# Patient Record
Sex: Female | Born: 1935 | Race: Black or African American | Hispanic: No | Smoking: Former smoker
Health system: Southern US, Community
[De-identification: ages and names within clinical notes are randomized; demographics above are authoritative.]

## PROBLEM LIST (undated history)

## (undated) DIAGNOSIS — K5909 Other constipation: Secondary | ICD-10-CM

## (undated) DIAGNOSIS — I712 Thoracic aortic aneurysm, without rupture, unspecified: Secondary | ICD-10-CM

## (undated) DIAGNOSIS — E876 Hypokalemia: Secondary | ICD-10-CM

## (undated) DIAGNOSIS — M129 Arthropathy, unspecified: Secondary | ICD-10-CM

## (undated) DIAGNOSIS — G622 Polyneuropathy due to other toxic agents: Secondary | ICD-10-CM

## (undated) DIAGNOSIS — I10 Essential (primary) hypertension: Secondary | ICD-10-CM

## (undated) DIAGNOSIS — I498 Other specified cardiac arrhythmias: Secondary | ICD-10-CM

## (undated) DIAGNOSIS — E785 Hyperlipidemia, unspecified: Secondary | ICD-10-CM

## (undated) DIAGNOSIS — T4145XA Adverse effect of unspecified anesthetic, initial encounter: Secondary | ICD-10-CM

## (undated) DIAGNOSIS — D649 Anemia, unspecified: Secondary | ICD-10-CM

## (undated) DIAGNOSIS — N390 Urinary tract infection, site not specified: Secondary | ICD-10-CM

## (undated) DIAGNOSIS — H409 Unspecified glaucoma: Secondary | ICD-10-CM

## (undated) DIAGNOSIS — M159 Polyosteoarthritis, unspecified: Secondary | ICD-10-CM

## (undated) DIAGNOSIS — R1310 Dysphagia, unspecified: Secondary | ICD-10-CM

## (undated) DIAGNOSIS — G894 Chronic pain syndrome: Secondary | ICD-10-CM

## (undated) DIAGNOSIS — J690 Pneumonitis due to inhalation of food and vomit: Secondary | ICD-10-CM

## (undated) DIAGNOSIS — M12812 Other specific arthropathies, not elsewhere classified, left shoulder: Secondary | ICD-10-CM

## (undated) DIAGNOSIS — R109 Unspecified abdominal pain: Secondary | ICD-10-CM

## (undated) DIAGNOSIS — C169 Malignant neoplasm of stomach, unspecified: Secondary | ICD-10-CM

## (undated) DIAGNOSIS — I82409 Acute embolism and thrombosis of unspecified deep veins of unspecified lower extremity: Secondary | ICD-10-CM

## (undated) DIAGNOSIS — M1710 Unilateral primary osteoarthritis, unspecified knee: Secondary | ICD-10-CM

## (undated) DIAGNOSIS — R011 Cardiac murmur, unspecified: Secondary | ICD-10-CM

## (undated) DIAGNOSIS — N959 Unspecified menopausal and perimenopausal disorder: Secondary | ICD-10-CM

## (undated) DIAGNOSIS — J984 Other disorders of lung: Secondary | ICD-10-CM

## (undated) DIAGNOSIS — Z85028 Personal history of other malignant neoplasm of stomach: Secondary | ICD-10-CM

## (undated) DIAGNOSIS — L97423 Non-pressure chronic ulcer of left heel and midfoot with necrosis of muscle: Secondary | ICD-10-CM

## (undated) DIAGNOSIS — K3184 Gastroparesis: Secondary | ICD-10-CM

## (undated) DIAGNOSIS — E119 Type 2 diabetes mellitus without complications: Secondary | ICD-10-CM

## (undated) DIAGNOSIS — R32 Unspecified urinary incontinence: Secondary | ICD-10-CM

## (undated) DIAGNOSIS — G309 Alzheimer's disease, unspecified: Secondary | ICD-10-CM

## (undated) DIAGNOSIS — G629 Polyneuropathy, unspecified: Secondary | ICD-10-CM

## (undated) DIAGNOSIS — F0280 Dementia in other diseases classified elsewhere without behavioral disturbance: Secondary | ICD-10-CM

## (undated) DIAGNOSIS — E1151 Type 2 diabetes mellitus with diabetic peripheral angiopathy without gangrene: Secondary | ICD-10-CM

## (undated) DIAGNOSIS — M109 Gout, unspecified: Secondary | ICD-10-CM

## (undated) DIAGNOSIS — S82201A Unspecified fracture of shaft of right tibia, initial encounter for closed fracture: Secondary | ICD-10-CM

## (undated) DIAGNOSIS — F329 Major depressive disorder, single episode, unspecified: Secondary | ICD-10-CM

## (undated) DIAGNOSIS — S7291XA Unspecified fracture of right femur, initial encounter for closed fracture: Secondary | ICD-10-CM

## (undated) DIAGNOSIS — K759 Inflammatory liver disease, unspecified: Secondary | ICD-10-CM

## (undated) DIAGNOSIS — R918 Other nonspecific abnormal finding of lung field: Secondary | ICD-10-CM

## (undated) DIAGNOSIS — R55 Syncope and collapse: Secondary | ICD-10-CM

## (undated) DIAGNOSIS — I7025 Atherosclerosis of native arteries of other extremities with ulceration: Secondary | ICD-10-CM

## (undated) DIAGNOSIS — M75101 Unspecified rotator cuff tear or rupture of right shoulder, not specified as traumatic: Secondary | ICD-10-CM

## (undated) DIAGNOSIS — T8859XA Other complications of anesthesia, initial encounter: Secondary | ICD-10-CM

## (undated) DIAGNOSIS — G609 Hereditary and idiopathic neuropathy, unspecified: Secondary | ICD-10-CM

## (undated) DIAGNOSIS — M75102 Unspecified rotator cuff tear or rupture of left shoulder, not specified as traumatic: Secondary | ICD-10-CM

## (undated) DIAGNOSIS — M1990 Unspecified osteoarthritis, unspecified site: Secondary | ICD-10-CM

## (undated) DIAGNOSIS — K219 Gastro-esophageal reflux disease without esophagitis: Secondary | ICD-10-CM

## (undated) DIAGNOSIS — B9620 Unspecified Escherichia coli [E. coli] as the cause of diseases classified elsewhere: Secondary | ICD-10-CM

## (undated) DIAGNOSIS — M12811 Other specific arthropathies, not elsewhere classified, right shoulder: Secondary | ICD-10-CM

## (undated) DIAGNOSIS — S82209A Unspecified fracture of shaft of unspecified tibia, initial encounter for closed fracture: Secondary | ICD-10-CM

## (undated) DIAGNOSIS — E1143 Type 2 diabetes mellitus with diabetic autonomic (poly)neuropathy: Secondary | ICD-10-CM

## (undated) HISTORY — DX: Personal history of other malignant neoplasm of stomach: Z85.028

## (undated) HISTORY — DX: Other nonspecific abnormal finding of lung field: R91.8

## (undated) HISTORY — DX: Unilateral primary osteoarthritis, unspecified knee: M17.10

## (undated) HISTORY — DX: Unspecified menopausal and perimenopausal disorder: N95.9

## (undated) HISTORY — PX: CATARACT EXTRACTION W/ INTRAOCULAR LENS  IMPLANT, BILATERAL: SHX1307

## (undated) HISTORY — DX: Hypokalemia: E87.6

## (undated) HISTORY — DX: Unspecified Escherichia coli (E. coli) as the cause of diseases classified elsewhere: B96.20

## (undated) HISTORY — DX: Dysphagia, unspecified: R13.10

## (undated) HISTORY — DX: Chronic pain syndrome: G89.4

## (undated) HISTORY — DX: Urinary tract infection, site not specified: N39.0

## (undated) HISTORY — DX: Unspecified rotator cuff tear or rupture of right shoulder, not specified as traumatic: M75.101

## (undated) HISTORY — DX: Anemia, unspecified: D64.9

## (undated) HISTORY — DX: Arthropathy, unspecified: M12.9

## (undated) HISTORY — DX: Malignant neoplasm of stomach, unspecified: C16.9

## (undated) HISTORY — DX: Unspecified abdominal pain: R10.9

## (undated) HISTORY — DX: Unspecified fracture of right femur, initial encounter for closed fracture: S72.91XA

## (undated) HISTORY — DX: Atherosclerosis of native arteries of other extremities with ulceration: I70.25

## (undated) HISTORY — PX: COLONOSCOPY: SHX174

## (undated) HISTORY — DX: Hereditary and idiopathic neuropathy, unspecified: G60.9

## (undated) HISTORY — DX: Gastro-esophageal reflux disease without esophagitis: K21.9

## (undated) HISTORY — DX: Polyneuropathy due to other toxic agents: G62.2

## (undated) HISTORY — DX: Type 2 diabetes mellitus without complications: E11.9

## (undated) HISTORY — DX: Alzheimer's disease, unspecified: G30.9

## (undated) HISTORY — DX: Type 2 diabetes mellitus with diabetic peripheral angiopathy without gangrene: E11.51

## (undated) HISTORY — DX: Other specified cardiac arrhythmias: I49.8

## (undated) HISTORY — DX: Dementia in other diseases classified elsewhere without behavioral disturbance: F02.80

## (undated) HISTORY — DX: Polyneuropathy, unspecified: G62.9

## (undated) HISTORY — DX: Other disorders of lung: J98.4

## (undated) HISTORY — DX: Unspecified urinary incontinence: R32

## (undated) HISTORY — DX: Gout, unspecified: M10.9

## (undated) HISTORY — DX: Other specific arthropathies, not elsewhere classified, right shoulder: M12.811

## (undated) HISTORY — PX: FRACTURE SURGERY: SHX138

## (undated) HISTORY — DX: Other constipation: K59.09

## (undated) HISTORY — DX: Unspecified fracture of shaft of unspecified tibia, initial encounter for closed fracture: S82.209A

## (undated) HISTORY — DX: Gastroparesis: K31.84

## (undated) HISTORY — DX: Polyosteoarthritis, unspecified: M15.9

## (undated) HISTORY — DX: Other specific arthropathies, not elsewhere classified, left shoulder: M12.812

## (undated) HISTORY — DX: Cardiac murmur, unspecified: R01.1

## (undated) HISTORY — DX: Unspecified glaucoma: H40.9

## (undated) HISTORY — DX: Major depressive disorder, single episode, unspecified: F32.9

## (undated) HISTORY — PX: BILROTH II PROCEDURE: SHX1232

## (undated) HISTORY — DX: Syncope and collapse: R55

## (undated) HISTORY — DX: Hyperlipidemia, unspecified: E78.5

## (undated) HISTORY — DX: Non-pressure chronic ulcer of left heel and midfoot with necrosis of muscle: L97.423

## (undated) HISTORY — DX: Essential (primary) hypertension: I10

## (undated) HISTORY — DX: Unspecified rotator cuff tear or rupture of left shoulder, not specified as traumatic: M75.102

## (undated) HISTORY — DX: Unspecified fracture of shaft of right tibia, initial encounter for closed fracture: S82.201A

## (undated) HISTORY — DX: Type 2 diabetes mellitus with diabetic autonomic (poly)neuropathy: E11.43

---

## 1998-05-05 ENCOUNTER — Other Ambulatory Visit: Admission: RE | Admit: 1998-05-05 | Discharge: 1998-05-05 | Payer: Self-pay | Admitting: Internal Medicine

## 1999-01-22 ENCOUNTER — Emergency Department (HOSPITAL_COMMUNITY): Admission: EM | Admit: 1999-01-22 | Discharge: 1999-01-22 | Payer: Self-pay | Admitting: Emergency Medicine

## 1999-01-24 ENCOUNTER — Encounter: Payer: Self-pay | Admitting: Emergency Medicine

## 1999-01-24 ENCOUNTER — Emergency Department (HOSPITAL_COMMUNITY): Admission: EM | Admit: 1999-01-24 | Discharge: 1999-01-24 | Payer: Self-pay | Admitting: Emergency Medicine

## 1999-01-25 ENCOUNTER — Encounter: Payer: Self-pay | Admitting: Internal Medicine

## 1999-01-25 ENCOUNTER — Ambulatory Visit (HOSPITAL_COMMUNITY): Admission: RE | Admit: 1999-01-25 | Discharge: 1999-01-25 | Payer: Self-pay | Admitting: Internal Medicine

## 1999-02-08 ENCOUNTER — Other Ambulatory Visit: Admission: RE | Admit: 1999-02-08 | Discharge: 1999-02-08 | Payer: Self-pay | Admitting: Obstetrics and Gynecology

## 1999-08-19 ENCOUNTER — Encounter: Payer: Self-pay | Admitting: Emergency Medicine

## 1999-08-19 ENCOUNTER — Emergency Department (HOSPITAL_COMMUNITY): Admission: EM | Admit: 1999-08-19 | Discharge: 1999-08-20 | Payer: Self-pay | Admitting: Emergency Medicine

## 2000-02-17 ENCOUNTER — Encounter: Payer: Self-pay | Admitting: Internal Medicine

## 2000-02-17 ENCOUNTER — Ambulatory Visit (HOSPITAL_COMMUNITY): Admission: RE | Admit: 2000-02-17 | Discharge: 2000-02-17 | Payer: Self-pay | Admitting: Internal Medicine

## 2000-10-17 ENCOUNTER — Other Ambulatory Visit: Admission: RE | Admit: 2000-10-17 | Discharge: 2000-10-17 | Payer: Self-pay | Admitting: Internal Medicine

## 2000-12-18 ENCOUNTER — Ambulatory Visit (HOSPITAL_COMMUNITY): Admission: RE | Admit: 2000-12-18 | Discharge: 2000-12-18 | Payer: Self-pay | Admitting: Internal Medicine

## 2000-12-18 ENCOUNTER — Encounter: Payer: Self-pay | Admitting: Internal Medicine

## 2002-08-08 ENCOUNTER — Emergency Department (HOSPITAL_COMMUNITY): Admission: EM | Admit: 2002-08-08 | Discharge: 2002-08-09 | Payer: Self-pay | Admitting: *Deleted

## 2002-08-08 ENCOUNTER — Encounter: Payer: Self-pay | Admitting: *Deleted

## 2007-02-06 ENCOUNTER — Ambulatory Visit: Payer: Self-pay | Admitting: Oncology

## 2007-03-02 LAB — COMPREHENSIVE METABOLIC PANEL
ALT: 9 U/L (ref 0–35)
AST: 22 U/L (ref 0–37)
Albumin: 3.9 g/dL (ref 3.5–5.2)
CO2: 26 mEq/L (ref 19–32)
Calcium: 9.4 mg/dL (ref 8.4–10.5)
Chloride: 103 mEq/L (ref 96–112)
Potassium: 3.7 mEq/L (ref 3.5–5.3)

## 2007-03-02 LAB — CBC WITH DIFFERENTIAL/PLATELET
BASO%: 0.9 % (ref 0.0–2.0)
Basophils Absolute: 0.1 10*3/uL (ref 0.0–0.1)
EOS%: 5 % (ref 0.0–7.0)
HCT: 33.6 % — ABNORMAL LOW (ref 34.8–46.6)
HGB: 11.4 g/dL — ABNORMAL LOW (ref 11.6–15.9)
MCH: 33.3 pg (ref 26.0–34.0)
MCHC: 33.8 g/dL (ref 32.0–36.0)
MONO#: 1 10*3/uL — ABNORMAL HIGH (ref 0.1–0.9)
RDW: 14.5 % (ref 11.3–14.5)
WBC: 7.6 10*3/uL (ref 3.9–10.0)
lymph#: 1.7 10*3/uL (ref 0.9–3.3)

## 2007-03-02 LAB — CEA: CEA: 1.5 ng/mL (ref 0.0–5.0)

## 2007-04-05 ENCOUNTER — Ambulatory Visit: Payer: Self-pay | Admitting: Internal Medicine

## 2007-04-05 LAB — CONVERTED CEMR LAB
AST: 26 units/L (ref 0–37)
Basophils Absolute: 0 10*3/uL (ref 0.0–0.1)
CO2: 29 meq/L (ref 19–32)
Calcium: 9.7 mg/dL (ref 8.4–10.5)
Eosinophils Absolute: 0.3 10*3/uL (ref 0.0–0.6)
Eosinophils Relative: 4.3 % (ref 0.0–5.0)
Folate: 13.1 ng/mL
GFR calc Af Amer: 62 mL/min
HDL: 49.6 mg/dL (ref >39.0)
Microalb Creat Ratio: 7.2 mg/g (ref 0.0–30.0)
Monocytes Absolute: 1.1 10*3/uL — ABNORMAL HIGH (ref 0.2–0.7)
Neutro Abs: 4.4 10*3/uL (ref 1.4–7.7)
Platelets: 217 10*3/uL (ref 150–400)
Potassium: 4.8 meq/L (ref 3.5–5.1)
RBC: 3.65 M/uL — ABNORMAL LOW (ref 3.87–5.11)
RDW: 14.3 % (ref 11.5–14.6)
Saturation Ratios: 19.9 % — ABNORMAL LOW (ref 20.0–50.0)
Sodium: 140 meq/L (ref 135–145)
TSH: 0.96 microintl units/mL (ref 0.35–5.50)
Transferrin: 240.7 mg/dL (ref >=212.0)
VLDL: 20 mg/dL (ref 0–40)
WBC: 7.8 10*3/uL (ref 4.5–10.5)

## 2007-04-27 ENCOUNTER — Ambulatory Visit: Payer: Self-pay | Admitting: Gastroenterology

## 2007-04-27 LAB — CONVERTED CEMR LAB
Ferritin: 37.6 ng/mL (ref 10.0–291.0)
Folate: 14 ng/mL
Transferrin: 215.3 mg/dL (ref >=212.0)

## 2007-04-30 ENCOUNTER — Ambulatory Visit: Payer: Self-pay | Admitting: Gastroenterology

## 2007-05-15 ENCOUNTER — Ambulatory Visit: Payer: Self-pay | Admitting: Internal Medicine

## 2007-05-21 ENCOUNTER — Encounter: Admission: RE | Admit: 2007-05-21 | Discharge: 2007-05-21 | Payer: Self-pay | Admitting: Internal Medicine

## 2007-05-29 ENCOUNTER — Ambulatory Visit: Payer: Self-pay | Admitting: Oncology

## 2007-06-05 ENCOUNTER — Emergency Department (HOSPITAL_COMMUNITY): Admission: EM | Admit: 2007-06-05 | Discharge: 2007-06-05 | Payer: Self-pay | Admitting: Emergency Medicine

## 2007-06-07 ENCOUNTER — Ambulatory Visit: Payer: Self-pay | Admitting: Internal Medicine

## 2007-07-03 ENCOUNTER — Ambulatory Visit: Payer: Self-pay | Admitting: Internal Medicine

## 2007-09-17 ENCOUNTER — Telehealth (INDEPENDENT_AMBULATORY_CARE_PROVIDER_SITE_OTHER): Payer: Self-pay | Admitting: *Deleted

## 2007-09-19 ENCOUNTER — Ambulatory Visit: Payer: Self-pay | Admitting: Internal Medicine

## 2007-09-19 DIAGNOSIS — M109 Gout, unspecified: Secondary | ICD-10-CM

## 2007-09-19 DIAGNOSIS — E119 Type 2 diabetes mellitus without complications: Secondary | ICD-10-CM

## 2007-09-19 DIAGNOSIS — Z85028 Personal history of other malignant neoplasm of stomach: Secondary | ICD-10-CM

## 2007-09-19 DIAGNOSIS — I10 Essential (primary) hypertension: Secondary | ICD-10-CM

## 2007-09-19 DIAGNOSIS — H409 Unspecified glaucoma: Secondary | ICD-10-CM

## 2007-09-19 DIAGNOSIS — G622 Polyneuropathy due to other toxic agents: Secondary | ICD-10-CM

## 2007-09-19 HISTORY — DX: Polyneuropathy due to other toxic agents: G62.2

## 2007-09-19 HISTORY — DX: Essential (primary) hypertension: I10

## 2007-09-19 HISTORY — DX: Unspecified glaucoma: H40.9

## 2007-09-19 HISTORY — DX: Personal history of other malignant neoplasm of stomach: Z85.028

## 2007-09-19 HISTORY — DX: Gout, unspecified: M10.9

## 2007-09-19 HISTORY — DX: Type 2 diabetes mellitus without complications: E11.9

## 2007-09-21 ENCOUNTER — Telehealth: Payer: Self-pay | Admitting: Internal Medicine

## 2007-10-03 ENCOUNTER — Ambulatory Visit: Payer: Self-pay | Admitting: Internal Medicine

## 2007-10-03 DIAGNOSIS — M159 Polyosteoarthritis, unspecified: Secondary | ICD-10-CM | POA: Insufficient documentation

## 2007-10-03 HISTORY — DX: Polyosteoarthritis, unspecified: M15.9

## 2007-12-07 ENCOUNTER — Ambulatory Visit: Payer: Self-pay | Admitting: Internal Medicine

## 2007-12-07 DIAGNOSIS — M1710 Unilateral primary osteoarthritis, unspecified knee: Secondary | ICD-10-CM

## 2007-12-07 DIAGNOSIS — IMO0002 Reserved for concepts with insufficient information to code with codable children: Secondary | ICD-10-CM

## 2007-12-07 HISTORY — DX: Reserved for concepts with insufficient information to code with codable children: IMO0002

## 2007-12-10 ENCOUNTER — Encounter (INDEPENDENT_AMBULATORY_CARE_PROVIDER_SITE_OTHER): Payer: Self-pay | Admitting: *Deleted

## 2007-12-25 ENCOUNTER — Encounter: Payer: Self-pay | Admitting: Internal Medicine

## 2008-02-12 ENCOUNTER — Encounter: Payer: Self-pay | Admitting: Internal Medicine

## 2008-02-19 ENCOUNTER — Ambulatory Visit: Payer: Self-pay | Admitting: Oncology

## 2008-02-21 ENCOUNTER — Encounter: Payer: Self-pay | Admitting: Internal Medicine

## 2008-02-21 ENCOUNTER — Ambulatory Visit (HOSPITAL_COMMUNITY): Admission: RE | Admit: 2008-02-21 | Discharge: 2008-02-21 | Payer: Self-pay | Admitting: Oncology

## 2008-02-21 LAB — COMPREHENSIVE METABOLIC PANEL
CO2: 25 mEq/L (ref 19–32)
Calcium: 9.8 mg/dL (ref 8.4–10.5)
Creatinine, Ser: 1.28 mg/dL — ABNORMAL HIGH (ref 0.40–1.20)
Glucose, Bld: 103 mg/dL — ABNORMAL HIGH (ref 70–99)
Sodium: 140 mEq/L (ref 135–145)
Total Bilirubin: 0.5 mg/dL (ref 0.3–1.2)
Total Protein: 7.3 g/dL (ref 6.0–8.3)

## 2008-02-21 LAB — CBC WITH DIFFERENTIAL/PLATELET
Eosinophils Absolute: 0.1 10*3/uL (ref 0.0–0.5)
HCT: 35.4 % (ref 34.8–46.6)
LYMPH%: 22.2 % (ref 14.0–48.0)
MONO#: 1 10*3/uL — ABNORMAL HIGH (ref 0.1–0.9)
NEUT#: 3.6 10*3/uL (ref 1.5–6.5)
NEUT%: 58.6 % (ref 39.6–76.8)
Platelets: 180 10*3/uL (ref 145–400)
WBC: 6.1 10*3/uL (ref 3.9–10.0)

## 2008-02-21 LAB — CEA: CEA: 2.7 ng/mL (ref 0.0–5.0)

## 2008-02-21 LAB — LACTATE DEHYDROGENASE: LDH: 220 U/L (ref 94–250)

## 2008-03-03 DIAGNOSIS — C169 Malignant neoplasm of stomach, unspecified: Secondary | ICD-10-CM

## 2008-03-03 DIAGNOSIS — R918 Other nonspecific abnormal finding of lung field: Secondary | ICD-10-CM | POA: Insufficient documentation

## 2008-03-03 DIAGNOSIS — M129 Arthropathy, unspecified: Secondary | ICD-10-CM | POA: Insufficient documentation

## 2008-03-03 DIAGNOSIS — J984 Other disorders of lung: Secondary | ICD-10-CM

## 2008-03-03 HISTORY — DX: Malignant neoplasm of stomach, unspecified: C16.9

## 2008-03-03 HISTORY — DX: Other disorders of lung: J98.4

## 2008-03-03 HISTORY — DX: Other nonspecific abnormal finding of lung field: R91.8

## 2008-03-03 HISTORY — DX: Arthropathy, unspecified: M12.9

## 2008-03-06 ENCOUNTER — Ambulatory Visit: Payer: Self-pay | Admitting: Gastroenterology

## 2008-03-07 ENCOUNTER — Ambulatory Visit (HOSPITAL_COMMUNITY): Admission: RE | Admit: 2008-03-07 | Discharge: 2008-03-07 | Payer: Self-pay | Admitting: Internal Medicine

## 2008-03-17 ENCOUNTER — Encounter: Admission: RE | Admit: 2008-03-17 | Discharge: 2008-03-17 | Payer: Self-pay | Admitting: Oncology

## 2008-03-17 LAB — HM MAMMOGRAPHY

## 2008-03-31 ENCOUNTER — Ambulatory Visit: Payer: Self-pay | Admitting: Oncology

## 2008-04-02 ENCOUNTER — Encounter: Payer: Self-pay | Admitting: Internal Medicine

## 2008-04-07 ENCOUNTER — Ambulatory Visit: Payer: Self-pay | Admitting: Internal Medicine

## 2008-04-07 DIAGNOSIS — R109 Unspecified abdominal pain: Secondary | ICD-10-CM

## 2008-04-07 HISTORY — DX: Unspecified abdominal pain: R10.9

## 2008-04-07 LAB — CONVERTED CEMR LAB
BUN: 14 mg/dL (ref 6–23)
CO2: 30 meq/L (ref 19–32)
Calcium: 9.3 mg/dL (ref 8.4–10.5)
Chloride: 104 meq/L (ref 96–112)
Creatinine, Ser: 1.1 mg/dL (ref 0.4–1.2)
GFR calc Af Amer: 62 mL/min
GFR calc non Af Amer: 51 mL/min
Glucose, Bld: 91 mg/dL (ref 70–99)
Microalb Creat Ratio: 15.1 mg/g (ref 0.0–30.0)
Potassium: 4.5 meq/L (ref 3.5–5.1)
Sodium: 141 meq/L (ref 135–145)

## 2008-04-08 ENCOUNTER — Encounter: Payer: Self-pay | Admitting: Internal Medicine

## 2008-05-30 ENCOUNTER — Telehealth (INDEPENDENT_AMBULATORY_CARE_PROVIDER_SITE_OTHER): Payer: Self-pay | Admitting: *Deleted

## 2008-05-30 ENCOUNTER — Encounter: Payer: Self-pay | Admitting: Internal Medicine

## 2008-06-06 ENCOUNTER — Ambulatory Visit: Payer: Self-pay | Admitting: Oncology

## 2008-06-17 ENCOUNTER — Encounter: Payer: Self-pay | Admitting: Internal Medicine

## 2008-06-17 LAB — CBC WITH DIFFERENTIAL/PLATELET
BASO%: 1.4 % (ref 0.0–2.0)
LYMPH%: 26.1 % (ref 14.0–48.0)
MCHC: 33.4 g/dL (ref 32.0–36.0)
MCV: 102.7 fL — ABNORMAL HIGH (ref 81.0–101.0)
MONO%: 15.7 % — ABNORMAL HIGH (ref 0.0–13.0)
NEUT#: 3.4 10*3/uL (ref 1.5–6.5)
Platelets: 126 10*3/uL — ABNORMAL LOW (ref 145–400)
RBC: 3.53 10*6/uL — ABNORMAL LOW (ref 3.70–5.32)
RDW: 14.3 % (ref 11.3–14.5)
WBC: 6.2 10*3/uL (ref 3.9–10.0)

## 2008-06-17 LAB — COMPREHENSIVE METABOLIC PANEL
ALT: 12 U/L (ref 0–35)
AST: 20 U/L (ref 0–37)
Albumin: 3.7 g/dL (ref 3.5–5.2)
Alkaline Phosphatase: 67 U/L (ref 39–117)
Potassium: 4.2 mEq/L (ref 3.5–5.3)
Sodium: 140 mEq/L (ref 135–145)
Total Bilirubin: 0.3 mg/dL (ref 0.3–1.2)
Total Protein: 7 g/dL (ref 6.0–8.3)

## 2008-06-19 ENCOUNTER — Ambulatory Visit: Payer: Self-pay | Admitting: Internal Medicine

## 2008-06-19 DIAGNOSIS — G609 Hereditary and idiopathic neuropathy, unspecified: Secondary | ICD-10-CM

## 2008-06-19 DIAGNOSIS — E1142 Type 2 diabetes mellitus with diabetic polyneuropathy: Secondary | ICD-10-CM

## 2008-06-19 DIAGNOSIS — G629 Polyneuropathy, unspecified: Secondary | ICD-10-CM

## 2008-06-19 HISTORY — DX: Hereditary and idiopathic neuropathy, unspecified: G60.9

## 2008-06-19 HISTORY — DX: Polyneuropathy, unspecified: G62.9

## 2008-06-23 ENCOUNTER — Encounter (INDEPENDENT_AMBULATORY_CARE_PROVIDER_SITE_OTHER): Payer: Self-pay | Admitting: *Deleted

## 2008-06-30 ENCOUNTER — Telehealth (INDEPENDENT_AMBULATORY_CARE_PROVIDER_SITE_OTHER): Payer: Self-pay | Admitting: *Deleted

## 2008-07-04 ENCOUNTER — Telehealth (INDEPENDENT_AMBULATORY_CARE_PROVIDER_SITE_OTHER): Payer: Self-pay | Admitting: *Deleted

## 2008-07-08 ENCOUNTER — Ambulatory Visit: Payer: Self-pay | Admitting: Cardiovascular Disease

## 2008-07-15 ENCOUNTER — Telehealth (INDEPENDENT_AMBULATORY_CARE_PROVIDER_SITE_OTHER): Payer: Self-pay | Admitting: *Deleted

## 2008-07-23 ENCOUNTER — Encounter: Payer: Self-pay | Admitting: Internal Medicine

## 2008-08-01 ENCOUNTER — Telehealth (INDEPENDENT_AMBULATORY_CARE_PROVIDER_SITE_OTHER): Payer: Self-pay | Admitting: *Deleted

## 2008-08-07 ENCOUNTER — Ambulatory Visit: Payer: Self-pay | Admitting: Internal Medicine

## 2008-08-08 ENCOUNTER — Encounter: Payer: Self-pay | Admitting: Internal Medicine

## 2008-08-28 ENCOUNTER — Encounter: Payer: Self-pay | Admitting: Internal Medicine

## 2008-09-23 ENCOUNTER — Telehealth (INDEPENDENT_AMBULATORY_CARE_PROVIDER_SITE_OTHER): Payer: Self-pay | Admitting: *Deleted

## 2008-09-30 ENCOUNTER — Ambulatory Visit: Payer: Self-pay | Admitting: Internal Medicine

## 2008-09-30 DIAGNOSIS — R509 Fever, unspecified: Secondary | ICD-10-CM

## 2008-10-01 ENCOUNTER — Encounter: Payer: Self-pay | Admitting: Internal Medicine

## 2008-10-01 LAB — CONVERTED CEMR LAB
Crystals: NEGATIVE
Nitrite: POSITIVE — AB
RBC / HPF: NONE SEEN
Total Protein, Urine: NEGATIVE mg/dL

## 2008-10-07 ENCOUNTER — Encounter: Payer: Self-pay | Admitting: Internal Medicine

## 2008-10-14 ENCOUNTER — Ambulatory Visit: Payer: Self-pay | Admitting: Oncology

## 2008-10-21 ENCOUNTER — Encounter: Payer: Self-pay | Admitting: Internal Medicine

## 2008-12-17 ENCOUNTER — Ambulatory Visit: Payer: Self-pay | Admitting: Internal Medicine

## 2008-12-17 DIAGNOSIS — I498 Other specified cardiac arrhythmias: Secondary | ICD-10-CM

## 2008-12-17 DIAGNOSIS — N959 Unspecified menopausal and perimenopausal disorder: Secondary | ICD-10-CM | POA: Insufficient documentation

## 2008-12-17 HISTORY — DX: Unspecified menopausal and perimenopausal disorder: N95.9

## 2008-12-17 HISTORY — DX: Other specified cardiac arrhythmias: I49.8

## 2008-12-18 LAB — CONVERTED CEMR LAB
ALT: 17 units/L (ref 0–35)
BUN: 17 mg/dL (ref 6–23)
Bilirubin, Direct: 0.1 mg/dL (ref 0.0–0.3)
CO2: 29 meq/L (ref 19–32)
Chloride: 107 meq/L (ref 96–112)
Cholesterol: 192 mg/dL (ref 0–200)
Creatinine, Ser: 1 mg/dL (ref 0.4–1.2)
Creatinine,U: 146 mg/dL
Eosinophils Relative: 1.7 % (ref 0.0–5.0)
GFR calc Af Amer: 69 mL/min
HCT: 34.8 % — ABNORMAL LOW (ref 36.0–46.0)
HDL: 59.4 mg/dL (ref >39.0)
Hgb A1c MFr Bld: 5.7 % (ref 4.6–6.0)
Microalb Creat Ratio: 21.9 mg/g (ref 0.0–30.0)
Microalb, Ur: 3.2 mg/dL — ABNORMAL HIGH (ref 0.0–1.9)
Nitrite: NEGATIVE
Platelets: 142 10*3/uL — ABNORMAL LOW (ref 150–400)
Potassium: 4.3 meq/L (ref 3.5–5.1)
RBC: 3.42 M/uL — ABNORMAL LOW (ref 3.87–5.11)
Sodium: 143 meq/L (ref 135–145)
TSH: 0.88 microintl units/mL (ref 0.35–5.50)
Total CHOL/HDL Ratio: 3.2
Total Protein: 7.2 g/dL (ref 6.0–8.3)
Urine Glucose: NEGATIVE mg/dL
VLDL: 21 mg/dL (ref 0–40)

## 2008-12-22 LAB — CONVERTED CEMR LAB: Vit D, 25-Hydroxy: 10 ng/mL — ABNORMAL LOW (ref 30–89)

## 2009-02-05 ENCOUNTER — Encounter: Payer: Self-pay | Admitting: Internal Medicine

## 2009-02-17 ENCOUNTER — Ambulatory Visit: Payer: Self-pay | Admitting: Internal Medicine

## 2009-02-17 DIAGNOSIS — M109 Gout, unspecified: Secondary | ICD-10-CM

## 2009-02-17 HISTORY — DX: Gout, unspecified: M10.9

## 2009-06-15 ENCOUNTER — Ambulatory Visit: Payer: Self-pay | Admitting: Internal Medicine

## 2009-06-15 LAB — CONVERTED CEMR LAB
Calcium: 9.4 mg/dL (ref 8.4–10.5)
Creatinine,U: 66.7 mg/dL
Glucose, Bld: 105 mg/dL — ABNORMAL HIGH (ref 70–99)
LDL Cholesterol: 97 mg/dL (ref 0–99)
Microalb Creat Ratio: 18 mg/g (ref 0.0–30.0)
Uric Acid, Serum: 4.4 mg/dL (ref 2.4–7.0)

## 2009-06-17 ENCOUNTER — Ambulatory Visit: Payer: Self-pay | Admitting: Internal Medicine

## 2009-06-17 DIAGNOSIS — F329 Major depressive disorder, single episode, unspecified: Secondary | ICD-10-CM

## 2009-06-17 DIAGNOSIS — F3289 Other specified depressive episodes: Secondary | ICD-10-CM

## 2009-06-17 HISTORY — DX: Major depressive disorder, single episode, unspecified: F32.9

## 2009-06-17 HISTORY — DX: Other specified depressive episodes: F32.89

## 2009-06-23 ENCOUNTER — Telehealth: Payer: Self-pay | Admitting: Internal Medicine

## 2009-07-22 ENCOUNTER — Telehealth: Payer: Self-pay | Admitting: Internal Medicine

## 2009-12-08 ENCOUNTER — Ambulatory Visit: Payer: Self-pay | Admitting: Internal Medicine

## 2009-12-08 LAB — CONVERTED CEMR LAB
BUN: 15 mg/dL (ref 6–23)
Basophils Absolute: 0 10*3/uL (ref 0.0–0.1)
Basophils Relative: 0.3 % (ref 0.0–3.0)
CO2: 31 meq/L (ref 19–32)
Calcium: 9.3 mg/dL (ref 8.4–10.5)
Chloride: 105 meq/L (ref 96–112)
Cholesterol: 186 mg/dL (ref 0–200)
Creatinine, Ser: 1.2 mg/dL (ref 0.4–1.2)
Eosinophils Absolute: 0.1 10*3/uL (ref 0.0–0.7)
Eosinophils Relative: 1.5 % (ref 0.0–5.0)
Hemoglobin, Urine: NEGATIVE
Hemoglobin: 12.1 g/dL (ref 12.0–15.0)
LDL Cholesterol: 96 mg/dL (ref 0–99)
MCV: 101.6 fL — ABNORMAL HIGH (ref 78.0–100.0)
Neutrophils Relative %: 51.7 % (ref 43.0–77.0)
Potassium: 4.3 meq/L (ref 3.5–5.1)
RBC: 3.64 M/uL — ABNORMAL LOW (ref 3.87–5.11)
RDW: 13.9 % (ref 11.5–14.6)
TSH: 0.81 microintl units/mL (ref 0.35–5.50)
Total CHOL/HDL Ratio: 3
Total Protein, Urine: NEGATIVE mg/dL
Urine Glucose: NEGATIVE mg/dL
VLDL: 26.4 mg/dL (ref 0.0–40.0)
pH: 5.5 (ref 5.0–8.0)

## 2009-12-15 ENCOUNTER — Ambulatory Visit: Payer: Self-pay | Admitting: Internal Medicine

## 2009-12-15 DIAGNOSIS — B9620 Unspecified Escherichia coli [E. coli] as the cause of diseases classified elsewhere: Secondary | ICD-10-CM

## 2009-12-15 DIAGNOSIS — N390 Urinary tract infection, site not specified: Secondary | ICD-10-CM

## 2009-12-15 HISTORY — DX: Urinary tract infection, site not specified: N39.0

## 2009-12-15 HISTORY — DX: Urinary tract infection, site not specified: B96.20

## 2009-12-16 ENCOUNTER — Telehealth (INDEPENDENT_AMBULATORY_CARE_PROVIDER_SITE_OTHER): Payer: Self-pay | Admitting: *Deleted

## 2009-12-17 ENCOUNTER — Telehealth: Payer: Self-pay | Admitting: Internal Medicine

## 2009-12-24 ENCOUNTER — Ambulatory Visit: Payer: Self-pay | Admitting: Gastroenterology

## 2009-12-24 ENCOUNTER — Encounter (INDEPENDENT_AMBULATORY_CARE_PROVIDER_SITE_OTHER): Payer: Self-pay | Admitting: *Deleted

## 2009-12-24 DIAGNOSIS — K3184 Gastroparesis: Secondary | ICD-10-CM

## 2009-12-24 DIAGNOSIS — R1310 Dysphagia, unspecified: Secondary | ICD-10-CM

## 2009-12-24 DIAGNOSIS — R1084 Generalized abdominal pain: Secondary | ICD-10-CM | POA: Insufficient documentation

## 2009-12-24 HISTORY — DX: Dysphagia, unspecified: R13.10

## 2009-12-24 HISTORY — DX: Gastroparesis: K31.84

## 2009-12-24 LAB — CONVERTED CEMR LAB
Basophils Absolute: 0.1 10*3/uL (ref 0.0–0.1)
Basophils Relative: 1.1 % (ref 0.0–3.0)
Eosinophils Absolute: 0.1 10*3/uL (ref 0.0–0.7)
Eosinophils Relative: 1.1 % (ref 0.0–5.0)
HCT: 38.5 % (ref 36.0–46.0)
MCHC: 32.8 g/dL (ref 30.0–36.0)
MCV: 102.6 fL — ABNORMAL HIGH (ref 78.0–100.0)
Platelets: 155 10*3/uL (ref 150.0–400.0)
RDW: 13.9 % (ref 11.5–14.6)
Saturation Ratios: 33 % (ref 20.0–50.0)

## 2010-01-06 ENCOUNTER — Ambulatory Visit: Payer: Self-pay | Admitting: Gastroenterology

## 2010-01-07 ENCOUNTER — Telehealth (INDEPENDENT_AMBULATORY_CARE_PROVIDER_SITE_OTHER): Payer: Self-pay | Admitting: *Deleted

## 2010-01-08 ENCOUNTER — Encounter: Payer: Self-pay | Admitting: Gastroenterology

## 2010-01-11 ENCOUNTER — Ambulatory Visit (HOSPITAL_COMMUNITY): Admission: RE | Admit: 2010-01-11 | Discharge: 2010-01-11 | Payer: Self-pay | Admitting: Gastroenterology

## 2010-01-27 DIAGNOSIS — R5381 Other malaise: Secondary | ICD-10-CM | POA: Insufficient documentation

## 2010-01-27 DIAGNOSIS — E785 Hyperlipidemia, unspecified: Secondary | ICD-10-CM

## 2010-01-27 DIAGNOSIS — R5383 Other fatigue: Secondary | ICD-10-CM

## 2010-01-27 HISTORY — DX: Hyperlipidemia, unspecified: E78.5

## 2010-02-01 ENCOUNTER — Encounter: Payer: Self-pay | Admitting: Internal Medicine

## 2010-02-01 ENCOUNTER — Ambulatory Visit: Payer: Self-pay | Admitting: Cardiovascular Disease

## 2010-02-01 ENCOUNTER — Ambulatory Visit: Payer: Self-pay

## 2010-02-01 ENCOUNTER — Ambulatory Visit (HOSPITAL_COMMUNITY): Admission: RE | Admit: 2010-02-01 | Discharge: 2010-02-01 | Payer: Self-pay | Admitting: Internal Medicine

## 2010-02-01 DIAGNOSIS — R011 Cardiac murmur, unspecified: Secondary | ICD-10-CM

## 2010-02-01 HISTORY — DX: Cardiac murmur, unspecified: R01.1

## 2010-02-04 ENCOUNTER — Encounter: Payer: Self-pay | Admitting: Internal Medicine

## 2010-04-09 ENCOUNTER — Ambulatory Visit: Payer: Self-pay | Admitting: Internal Medicine

## 2010-04-09 LAB — CONVERTED CEMR LAB
Calcium: 9.5 mg/dL (ref 8.4–10.5)
Chloride: 108 meq/L (ref 96–112)
Cholesterol: 207 mg/dL — ABNORMAL HIGH (ref 0–200)
Creatinine, Ser: 1.1 mg/dL (ref 0.4–1.2)
Direct LDL: 124.3 mg/dL
GFR calc non Af Amer: 58.78 mL/min (ref >60)
HDL: 65.6 mg/dL (ref >39.00)
Hgb A1c MFr Bld: 5.7 % (ref 4.6–6.5)

## 2010-04-12 ENCOUNTER — Telehealth: Payer: Self-pay | Admitting: Internal Medicine

## 2010-04-15 ENCOUNTER — Ambulatory Visit: Payer: Self-pay | Admitting: Internal Medicine

## 2010-05-19 ENCOUNTER — Encounter: Payer: Self-pay | Admitting: Internal Medicine

## 2010-06-24 ENCOUNTER — Encounter: Payer: Self-pay | Admitting: Internal Medicine

## 2010-06-25 ENCOUNTER — Encounter: Payer: Self-pay | Admitting: Internal Medicine

## 2010-08-23 ENCOUNTER — Encounter: Payer: Self-pay | Admitting: Internal Medicine

## 2010-10-11 ENCOUNTER — Ambulatory Visit: Payer: Self-pay | Admitting: Internal Medicine

## 2010-10-12 ENCOUNTER — Encounter: Payer: Self-pay | Admitting: Internal Medicine

## 2010-10-12 LAB — CONVERTED CEMR LAB
ALT: 19 units/L (ref 0–35)
Basophils Absolute: 0 10*3/uL (ref 0.0–0.1)
Basophils Relative: 0.5 % (ref 0.0–3.0)
Bilirubin, Direct: 0.1 mg/dL (ref 0.0–0.3)
CO2: 26 meq/L (ref 19–32)
Eosinophils Absolute: 0.1 10*3/uL (ref 0.0–0.7)
Eosinophils Relative: 2.8 % (ref 0.0–5.0)
Glucose, Bld: 81 mg/dL (ref 70–99)
HCT: 35 % — ABNORMAL LOW (ref 36.0–46.0)
LDL Cholesterol: 113 mg/dL — ABNORMAL HIGH (ref 0–99)
Lymphocytes Relative: 33.3 % (ref 12.0–46.0)
Lymphs Abs: 1.7 10*3/uL (ref 0.7–4.0)
MCV: 103.6 fL — ABNORMAL HIGH (ref 78.0–100.0)
Monocytes Absolute: 0.8 10*3/uL (ref 0.1–1.0)
Monocytes Relative: 15.5 % — ABNORMAL HIGH (ref 3.0–12.0)
Neutro Abs: 2.5 10*3/uL (ref 1.4–7.7)
Platelets: 149 10*3/uL — ABNORMAL LOW (ref 150.0–400.0)
RBC: 3.38 M/uL — ABNORMAL LOW (ref 3.87–5.11)
RDW: 14.8 % — ABNORMAL HIGH (ref 11.5–14.6)
Total Protein, Urine: NEGATIVE mg/dL
Total Protein: 6.6 g/dL (ref 6.0–8.3)
Triglycerides: 86 mg/dL (ref 0.0–149.0)
Urine Glucose: NEGATIVE mg/dL
WBC: 5.2 10*3/uL (ref 4.5–10.5)
pH: 6.5 (ref 5.0–8.0)

## 2010-10-15 ENCOUNTER — Ambulatory Visit: Payer: Self-pay | Admitting: Internal Medicine

## 2010-10-15 DIAGNOSIS — K5909 Other constipation: Secondary | ICD-10-CM

## 2010-10-15 HISTORY — DX: Other constipation: K59.09

## 2010-11-21 ENCOUNTER — Encounter: Payer: Self-pay | Admitting: Internal Medicine

## 2010-11-24 ENCOUNTER — Telehealth: Payer: Self-pay | Admitting: Internal Medicine

## 2010-11-30 NOTE — Letter (Signed)
Summary: [facility]/[facility]  [facility]   Imported By: Sherian Rein 06/29/2010 14:35:50  _____________________________________________________________________  External Attachment:    Type:   Image     Comment:   External Document

## 2010-11-30 NOTE — Progress Notes (Signed)
  Phone Note Refill Request  on April 12, 2010 10:51 AM  Refills Requested: Medication #1:  COLCHICINE 0.6 MG  TABS Take 1 tablet by mouth once a day as needed   Dosage confirmed as above?Dosage Confirmed   Notes: [facility] Initial call taken by: Scharlene Gloss,  April 12, 2010 10:52 AM    Prescriptions: COLCHICINE 0.6 MG  TABS (COLCHICINE) Take 1 tablet by mouth once a day as needed  #30 Tablet x 10   Entered by:   Zella Ball Ewing   Authorized by:   Corwin Levins MD   Signed by:   Scharlene Gloss on 04/12/2010   Method used:   [facility]ed to ...       [facility]  [address] [phone]* (retail)       [address]       [place], [place]  [address]       Ph: [phone] or [phone]       Fax: [phone]   RxID:   [id number]

## 2010-11-30 NOTE — Assessment & Plan Note (Signed)
Summary: F1Y/CHRONIC BRADY/JML  Medications Added SERTRALINE HCL 50 MG TABS (SERTRALINE HCL) 1 tab by mouth once daily      Allergies Added:   Referring Provider:  Oliver Barre, MD Primary Provider:  Oliver Barre, MD   History of Present Illness: Shari Mcmahon is seen today at the request of Dr Jonny Ruiz for murmur and bradycardia.  I saw her back in 2009 with similar issues.  She had a negative w/u back then with aortic sclerosis.  She is at assisted living.  She ambulates with a walker.  She is asymtomatic but the nurse at [facility] is concerned that her pulse is sometimes in the 50's/  Two years aga her atenolol was stopped.  She is still on Azor for HTN.  She denies postural symptoms, SSCP, dyspnea, syncope or palpitations.  I reviewed her echo from this am and it is benign with normal LV, mild basal septal hypertrophy and AV sclerosis.  Review of her old ECGS shows no heart block or BBB.  Her ECG is totally normal  SR 64 bmp.  I reassured her and her daughter Rudy Jew ( former nurse [phone]).  that she needs no further w/u and I thought her heart was fine with no need for pacer  Current Problems (verified): 1)  Bradycardia, Chronic  (ICD-427.89) 2)  Hypertension  (ICD-401.9) 3)  Hyperlipidemia  (ICD-272.4) 4)  Fatigue  (ICD-780.79) 5)  Abdominal Pain, Generalized  (ICD-789.07) 6)  Dysphagia Unspecified  (ICD-787.20) 7)  Gastroparesis  (ICD-536.3) 8)  Uti  (ICD-599.0) 9)  Depression  (ICD-311) 10)  Gouty Arthropathy Unspecified  (ICD-274.00) 11)  Menopausal Disorder  (ICD-627.9) 12)  Preventive Health Care  (ICD-V70.0) 13)  Fever Unspecified  (ICD-780.60) 14)  Peripheral Neuropathy  (ICD-356.9) 15)  Abdominal Pain, Chronic  (ICD-789.00) 16)  Lung Nodule  (ICD-518.89) 17)  Stomach Cancer  (ICD-151.9) 18)  Arthritis  (ICD-716.90) 19)  Degenerative Joint Disease, Knees, Bilateral  (ICD-715.96) 20)  Generalized Osteoarthrosis Involving Hand  (ICD-715.04) 21)  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 One By Mouth Qd 2)  Colchicine 0.6 Mg  Tabs (Colchicine) .... Take 1 Tablet By Mouth Once A Day As Needed 3)  Xalatan 0.005 %  Soln (Latanoprost) .... One Drop Each Eye Once Daily 4)  Cymbalta 60 Mg Cpep (Duloxetine Hcl) .Marland Kitchen.. 1po Once Daily 5)  Omeprazole 20 Mg Cpdr (Omeprazole) .Marland Kitchen.. 1 By Mouth Once Daily 6)  Sertraline Hcl 50 Mg Tabs (Sertraline Hcl) .Marland Kitchen.. 1 Tab By Mouth Once Daily  Allergies (verified): 1)  ! Vicodin 2)  ! Penicillin 3)  ! * Dulcolax 4)  * Timoptic  Past History:  Past Medical History: Last updated: 06/17/2009 Diabetes mellitus, type II Gout Hypertension Stomach Cancer - Dr. Arline Asp Peripheral neuropathy -due to chemo glaucoma DJD probable gastroparesis due to vagotomy chronic bradycardia Depression  Past Surgical History: Last updated: 09/19/2007 Vagotomy Partial Gastrectomy Billroth II gastorenterostomy  Family History: Last updated: 12/24/2009 She has a very remarkable family history which is of concern for hereditary colon cancer syndrome. Both her paternal grandfather and maternal grandmother had colon cancer.  father with hemorrhagic stroke several sibs with DM sister X2 with breast cancer sister & mother with uterine cancer sister with ovarian cancer  Social History: Last  updated: 12/24/2009 The patient's widowed and lives with her daughter. She has an eighth grade education. 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 She used to smoke a pack of cigarettes a day for 25 years but stopped 10 years ago. She does not use alcohol or has a history of alcohol or drug dependency.  Daily Caffeine Use Illicit Drug Use  - no  Review of Systems       Denies fever, malais, weight loss, blurry vision, decreased visual acuity, cough, sputum, SOB, hemoptysis, pleuritic pain, palpitaitons, heartburn, abdominal pain, melena, lower extremity edema, claudication, or rash. Some dysphasia with recent dilatation and history of gastric cancer.  Does not like to wear dentures  Vital Signs:  Patient profile:   77 year old female Height:      66 inches Weight:      171 pounds BMI:     27.70 Pulse rate:   64 / minute Resp:     12 per minute BP sitting:   110 / 60  (left arm)  Vitals Entered By: Kem Parkinson (February 01, 2010 8:43 AM)  Physical Exam  General:  Affect appropriate Healthy:  appears stated age HEENT: normal Neck supple with no adenopathy JVP normal no bruits no thyromegaly Lungs clear with no wheezing and good diaphragmatic motion Heart:  S1/S2 systolic  murmur,rub, gallop or click PMI normal Abdomen: benighn, BS positve, no tenderness, no AAA no bruit.  No HSM or HJR Distal pulses intact with no bruits No edema Neuro non-focal Skin warm and dry    Impression & Recommendations:  Problem # 1:  BRADYCARDIA, CHRONIC (ICD-427.89) Asymptomatic with no heart block.  No indication for pacer Her updated medication list for this problem includes:    Azor 10-40 Mg Tabs (Amlodipine-olmesartan) ..... One by mouth qd  Problem # 2:  HYPERTENSION (ICD-401.9) Well controlled avoid av nodal blocking drugs Her updated medication list for this problem includes:    Azor 10-40 Mg Tabs (Amlodipine-olmesartan) ..... One by mouth qd  Problem # 3:  CARDIAC MURMUR (ICD-785.2) Benign with no significant valve disease on echo today. Her updated medication list for this problem includes:    Azor 10-40 Mg Tabs (Amlodipine-olmesartan) ..... One by mouth qd   EKG Report  Procedure date:  02/01/2010  Findings:      NSR 64 Normal ECG

## 2010-11-30 NOTE — Procedures (Signed)
Summary: Upper Endoscopy  Patient: Shari Mcmahon Note: All result statuses are Final unless otherwise noted.  Tests: (1) Upper Endoscopy (EGD)   EGD Upper Endoscopy       DONE     [facility]     [address]     [place], [place]  [address]           ENDOSCOPY PROCEDURE REPORT           PATIENT:  Shari, Mcmahon  MR#:  [id number]     BIRTHDATE:  [birth year], 83 yrs. old  GENDER:  female           ENDOSCOPIST:  Vania Rea. Jarold Motto, MD, [facility]     Referred by:           PROCEDURE DATE:  01/06/2010     PROCEDURE:  EGD with biopsy, Elease Hashimoto Dilation of Esophagus     ASA CLASS:  Class III     INDICATIONS:  abdominal pain, early satiety, dysphagia H. OF     PARTIAL GASTRECTOMY FOR CARCINOMA.           MEDICATIONS:   Fentanyl 25 mcg IV, Versed 5 mg IV     TOPICAL ANESTHETIC:           DESCRIPTION OF PROCEDURE:   After the risks benefits and     alternatives of the procedure were thoroughly explained, informed     consent was obtained.  [facility] [id number] endoscope was     introduced through the mouth and advanced to the anastomosis,     limited by retching and gagging, patient cooperation.   The     instrument was slowly withdrawn as the mucosa was fully examined.     <<PROCEDUREIMAGES>>           S/P partial gastrectomy was found. BILLROTH II GASTROENTEROSTOMY     WIDELY PATENT AND NOT IRRITATED.NO FOOD IN STOMACH.BIOPSY FOR     H.PYLORI DONE.  The esophagus and gastroesophageal junction were     completely normal in appearance. EMPIRIC DILATION #50 F MALONEY     DILATOR PASSED EASILY.    Retroflexed views revealed no     abnormalities.    The scope was then withdrawn from the patient     and the procedure completed.           COMPLICATIONS:  None           ENDOSCOPIC IMPRESSION:     1) S/p partial gastrectomy     2) Normal esophagus     ?? OCCULT DTRICTURE VS. GASTROPARESIS.R/O CHOLIATHIASIS.     RECOMMENDATIONS:     1) Await biopsy results     2) Rx CLO if  positive     3) continue current medications     ULTRASOUND EXAM OF GB.           REPEAT EXAM:  No           ______________________________     Vania Rea. Jarold Motto, MD, [facility]           CC:  Corwin Levins, MD           n.     Rosalie DoctorMarland Kitchen   Vania Rea. Theora Vankirk at 01/06/2010 10:28 AM           Westley Hummer, [phone]  Note: An exclamation mark (!) indicates a result that was not dispersed into the flowsheet. Document Creation Date: 01/06/2010 10:28 AM _______________________________________________________________________  (1) Order result  status: Final Collection or observation date-time: 01/06/2010 10:19 Requested date-time:  Receipt date-time:  Reported date-time:  Referring Physician:   Ordering Physician: Sheryn Bison [phone]) Specimen Source:  Source: Launa Grill Order Number: [id number] Lab site:   Appended Document: Upper Endoscopy    Clinical Lists Changes  Orders: Added new Test order of Ultrasound Abdomen (UAS) - Signed

## 2010-11-30 NOTE — Letter (Signed)
Summary: EGD Instructions  [facility]  [address] [place], [place] [address]   Phone: [phone]  Fax: [phone]       Shari Mcmahon    27-Jul-1927    MRN: [id number]       Procedure Day /Date: 01/06/10, Wednesday     Arrival Time:  9:00     Procedure Time: 10:00     Location of Procedure:                    _ X _ [facility] (4th Floor)    PREPARATION FOR ENDOSCOPY   On 01/06/10 THE DAY OF THE PROCEDURE:  1.   No solid foods, milk or milk products are allowed after midnight the night before your procedure.  2.   Do not drink anything colored red or purple.  Avoid juices with pulp.  No orange juice.  3.  You may drink clear liquids until 8:00, which is 2 hours before your procedure.                                                                                                CLEAR LIQUIDS INCLUDE: Water Jello Ice Popsicles Tea (sugar ok, no milk/cream) Powdered fruit flavored drinks Coffee (sugar ok, no milk/cream) Gatorade Juice: apple, white grape, white cranberry  Lemonade Clear bullion, consomm, broth Carbonated beverages (any kind) Strained chicken noodle soup Hard Candy   MEDICATION INSTRUCTIONS  Unless otherwise instructed, you should take regular prescription medications with a small sip of water as early as possible the morning of your procedure.                    OTHER INSTRUCTIONS  You will need a responsible adult at least 75 years of age to accompany you and drive you home.   This person must remain in the waiting room during your procedure.  Wear loose fitting clothing that is easily removed.  Leave jewelry and other valuables at home.  However, you may wish to bring a book to read or an iPod/MP3 player to listen to music as you wait for your procedure to start.  Remove all body piercing jewelry and leave at home.  Total time from sign-in until discharge is approximately 2-3 hours.  You should  go home directly after your procedure and rest.  You can resume normal activities the day after your procedure.  The day of your procedure you should not:   Drive   Make legal decisions   Operate machinery   Drink alcohol   Return to work  You will receive specific instructions about eating, activities and medications before you leave.    The above instructions have been reviewed and explained to me by   _______________________    I fully understand and can verbalize these instructions _____________________________ Date _________

## 2010-11-30 NOTE — Letter (Signed)
Summary: Patient [name] Biopsy Results  [facility]  [address] [place], [place] [address]   Phone: [phone]  Fax: [phone]        January 08, 2010 MRN: [id number]    Shari Mcmahon [address] [place], [place]  [address]    Dear [name],  I am pleased to inform you that the biopsies taken during your recent endoscopic examination did not show any evidence of cancer upon pathologic examination.  Additional information/recommendations:  __No further action is needed at this time.  Please follow-up with      your primary care physician for your other healthcare needs.  __ Please call [phone] to schedule a return visit to review      your condition.  xx__ Continue with the treatment plan as outlined on the day of your      exam.  __ You should have a repeat endoscopic examination for this problem              in _ months/years.   Please call us if you are having persistent problems or have questions about your condition that have not been fully answered at this time.  Sincerely,  Mardella Layman MD [facility]  This letter has been electronically signed by your physician.  Appended Document: Patient Notice-Endo Biopsy Results Letter mailed 3.15.11

## 2010-11-30 NOTE — Progress Notes (Signed)
Summary: Abd [place]   Phone Note Outgoing Call   Summary of Call: Per Dr. Jarold Motto,  pt needs [place] abd.  Scheduled for Monday March 14 at 9:00 at [facility].  Pt notified.   Initial call taken by: Ashok Cordia RN,  January 07, 2010 3:07 PM

## 2010-11-30 NOTE — Progress Notes (Signed)
Summary: rx refill  Phone Note Call from Patient Call back at Home Phone [phone]   Caller: Patient Summary of Call: pt called requesting refill fo BP meds. Initial call taken by: Margaret Pyle, CMA,  December 17, 2009 11:17 AM    Prescriptions: AZOR 10-40 MG  TABS (AMLODIPINE-OLMESARTAN) one by mouth qd  #30 Tablet x 11   Entered by:   Margaret Pyle, CMA   Authorized by:   Corwin Levins MD   Signed by:   Margaret Pyle, CMA on 12/17/2009   Method used:   [facility] [phone][facility] (retail[facility]       [address]       [place], [place]  [id number]       Ph: [phone] or [phone]       Fax: [phone]   RxID:   [id number]

## 2010-11-30 NOTE — Assessment & Plan Note (Signed)
Summary: chronic abdominal pain--ch.    History of Present Illness Visit Type: Follow-up Consult Primary GI MD: Sheryn Bison MD FACP FAGA Primary Provider: Oliver Barre, MD Requesting Provider: Oliver Barre, MD Chief Complaint: Patinet complains of upper abdominal pain that she says started a couple of weeks ago. She complains that the pain is worse when she eats and she has can only eat small meals. She states that she take MOM for her constipation.  History of Present Illness:   77 year old African American female who has had previous partial gastrectomy and Billroth II gastroenterostomy because of gastric carcinoma. She received postoperative chemotherapy and radiation therapy was followed by Dr. Arline Asp in oncology for many years. She has had postoperative gastroparesis which responded in the past 2 Reglan therapy but she developed some problems with tardive dyskinesia and had to stop this medication. She has chronic glucose intolerance, glaucoma, hypertension, and a very strong family history of gynecologic malignancies. She is currently out of the primary care of Dr. Oliver Barre.  She is on omeprazole 20 mg a day for the last week. She has early satiety, regurgitation, and some solid food dysphasia. She denies lower gastrointestinal problems. For chronic depression she is on Cymbalta 60 mg a day. She also takes colchicine 0.6 mg one to 2 times a day for chronic gout. She is on Azor 10-40 or hypertension and uses p.r.n. milk of magnesia.   GI Review of Systems    Reports abdominal pain and  bloating.     Location of  Abdominal pain: upper abdomen.    Denies acid reflux, belching, chest pain, dysphagia with liquids, dysphagia with solids, heartburn, loss of appetite, nausea, vomiting, vomiting blood, weight loss, and  weight gain.        Denies anal fissure, black tarry stools, change in bowel habit, constipation, diarrhea, diverticulosis, fecal incontinence, heme positive stool,  hemorrhoids, irritable bowel syndrome, jaundice, light color stool, liver problems, rectal bleeding, and  rectal pain. Preventive Screening-Counseling & Management      Drug Use:  no.      Current Medications (verified): 1)  Azor 10-40 Mg  Tabs (Amlodipine-Olmesartan) .... One By Mouth Qd 2)  D 1000 1000 Unit Caps (Cholecalciferol) .... Take 1 Capsule By Mouth Once A Day 3)  Colchicine 0.6 Mg  Tabs (Colchicine) .... Take 1 Tablet By Mouth Once A Day As Needed 4)  Xalatan 0.005 %  Soln (Latanoprost) .... One Drop Each Eye Once Daily 5)  [employer]vs Stool Softener 100 Mg  Caps (Docusate Sodium) .... Take 1 Tablet By Mouth Once A Day As Needed 6)  Cymbalta 60 Mg Cpep (Duloxetine Hcl) .Marland Kitchen.. 1po Once Daily 7)  Omeprazole 20 Mg Cpdr (Omeprazole) .Marland Kitchen.. 1 By Mouth Once Daily 8)  Ciprofloxacin Hcl 500 Mg Tabs (Ciprofloxacin Hcl) .Marland Kitchen.. 1po Two Times A Day 9)  Milk of Magnesia 7.75 % Susp (Magnesium Hydroxide) .... Drinks Couple "swiggs" Out of Bottle Every Other Day  Allergies: 1)  ! Vicodin 2)  ! Penicillin 3)  ! * Dulcolax 4)  * Timoptic  Past History:  Past medical, surgical, family and social histories (including risk factors) reviewed for relevance to current acute and chronic problems.  Past Medical History: Reviewed history from 06/17/2009 and no changes required. Diabetes mellitus, type II Gout Hypertension Stomach Cancer - Dr. 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 Vagotomy Partial Gastrectomy  Billroth II gastorenterostomy  Family History: Reviewed history from 06/19/2008 and no changes required. She has a very remarkable family history which is of concern for hereditary colon cancer syndrome. Both her paternal grandfather and maternal grandmother had colon cancer.  father with hemorrhagic stroke several sibs  with DM sister X2 with breast cancer sister & mother with uterine cancer sister with ovarian cancer  Social History: Reviewed history from 09/19/2007 and no changes required. The patient's widowed and lives with her daughter. She has an eighth grade education. 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 She used to smoke a pack of cigarettes a day for 25 years but stopped 10 years ago. She does not use alcohol or has a history of alcohol or drug dependency.  Daily Caffeine Use Illicit Drug Use - no Drug Use:  no  Review of Systems       The patient complains of anxiety-new, arthritis/joint pain, back pain, fatigue, headaches-new, heart rhythm changes, and sleeping problems.  The patient denies allergy/sinus, anemia, blood in urine, breast changes/lumps, change in vision, confusion, cough, coughing up blood, depression-new, fainting, fever, hearing problems, heart murmur, itching, menstrual pain, muscle pains/cramps, night sweats, nosebleeds, pregnancy symptoms, shortness of breath, skin rash, sore throat, swelling of feet/legs, swollen lymph glands, thirst - excessive , urination - excessive , urination changes/pain, urine leakage, vision changes, and voice change.    Vital Signs:  Patient profile:   77 year old female Height:      66 inches Weight:      171.2 pounds BMI:     27.73 Pulse rate:   68 / minute Pulse rhythm:   regular BP sitting:   108 / 50  (left arm) Cuff size:   regular  Vitals Entered By: Harlow Mares CMA Duncan Dull) (December 24, 2009 10:11 AM)  Physical Exam  General:  Well developed, well nourished, no acute distress. Head:  Normocephalic and atraumatic. Eyes:  PERRLA, no icterus.exam deferred to patient's ophthalmologist.   Neck:  Supple; no masses or thyromegaly. Lungs:  Clear throughout to auscultation. Heart:  Regular rate and rhythm; no murmurs, rubs,  or bruits. Abdomen:  Soft, nontender and  nondistended. No masses, hepatosplenomegaly or hernias noted. Normal bowel sounds. Extremities:  No clubbing, cyanosis, edema or deformities noted. Neurologic:  Alert and  oriented x4;  grossly normal neurologically. Cervical Nodes:  No significant cervical adenopathy. Inguinal Nodes:  No significant inguinal adenopathy. Psych:  Alert and cooperative. Normal mood and affect.   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 We will schedule endoscopy and possible dilatation. If this is unremarkable we will consider domperidone treatment. Followup labs have been ordered. Also she is to continue her current omeprazole therapy with a standard gastroparesis diet. Review of her chart shows no evidence of recurrent carcinoma and she had a negative transvaginal pelvic ultrasound exam in July of 2008. She may need followup abdominal CT scan.P.R.N. Carafate suspension also prescribed as tolerated Orders: EGD (EGD) TLB-B12, Serum-Total ONLY ([address]-U04) TLB-Ferritin (82728-FER) TLB-Folic Acid (Folate) (82746-FOL) TLB-IBC Pnl (Iron/FE;Transferrin) (83550-IBC) TLB-CBC Platelet - w/Differential (85025-CBCD)  Problem # 2:  GASTROPARESIS (ICD-536.3) Assessment: Unchanged per above Orders: EGD (EGD) TLB-B12, Serum-Total ONLY ([address]-J19) TLB-Ferritin (82728-FER) TLB-Folic Acid (Folate) (82746-FOL) TLB-IBC Pnl (Iron/FE;Transferrin) (83550-IBC) TLB-CBC Platelet - w/Differential (85025-CBCD)  Problem # 3:  DEPRESSION (ICD-311) Assessment: Improved Continue Cymbalta milligrams a  day as tolerated  Problem # 4:  UTI (ICD-599.0) Assessment: Improved she apparently is on Cipro 500 mg twice a day.  Problem # 5:  GOUTY ARTHROPATHY UNSPECIFIED (ICD-274.00) Assessment: Comment Only  Problem # 6:  GLAUCOMA (ICD-365.9) Assessment: Unchanged  Problem # 7:   HYPERTENSION (ICD-401.9) Assessment: Improved Blood Pressure today is 108/50. I've asked continue her blood pressure medication as per Dr. Jonny Ruiz  Patient Instructions: 1)  Copy sent to : Dr. Oliver Barre 2)  Please continue current medications.  3)  Conscious Sedation brochure given.  4)  Upper Endoscopy with Dilatation brochure given.  5)  Labs pending 6)  Liquids and foods should be eaten in small, frequent meals. Refer to brochure for further instruction.  7)  Carafate qid ac and hs. 8)  The medication list was reviewed and reconciled.  All changed / newly prescribed medications were explained.  A complete medication list was provided to the patient / caregiver. Prescriptions: CARAFATE 1 GM/10ML  SUSP (SUCRALFATE) 2 TSP qid ac and hs  #14 oz x 3   Entered by:   Ashok Cordia RN   Authorized by:   Mardella Layman MD [facility]   Signed by:   Ashok Cordia RN on 12/24/2009   Method used:   [facility]  [address] [phone][facility] (retail)       [address]       [place], [place]  [id number]       Ph: [phone] or [phone]       Fax: [phone]   RxID:   [id number]

## 2010-11-30 NOTE — Progress Notes (Signed)
----   Converted from flag ---- ---- 12/15/2009 12:47 PM, Edman Circle wrote: APPT 3/9 @ 9:30 FOR ECHO & 10:45 FOR DR Eden Emms  ---- 12/15/2009 11:12 AM, Dagoberto Reef wrote: pt need echo and see Dr Eden Emms.    Thanks  ---- 12/15/2009 10:46 AM, Corwin Levins MD wrote: The following orders have been entered for this patient and placed on Admin Hold:  Type:     Referral       Code:   Cardiology Description:   Cardiology Referral Order Date:   12/15/2009   Authorized By:   Corwin Levins MD Order #:   [id number] Clinical Notes:   dr Eden Emms has seen in the past ------------------------------

## 2010-11-30 NOTE — Assessment & Plan Note (Signed)
Summary: 3 MO ROV /NWS  #   Vital Signs:  Patient profile:   77 year old female Height:      66 inches Weight:      173.50 pounds BMI:     28.10 O2 Sat:      98 % on Room air Temp:     96.8 degrees F oral Pulse rate:   57 / minute BP sitting:   128 / 64  (left arm) Cuff size:   regular  Vitals Entered ByZella Ball Ewing (April 15, 2010 9:17 AM)  O2 Flow:  Room air CC: 3 month ROV/RE   Primary Care Provider:  Oliver Barre, MD  CC:  3 month ROV/RE.  History of Present Illness: overall doing ok, but knees ache dialy mild to mod without effusion or falls;  Pt denies CP, sob, doe, wheezing, orthopnea, pnd, worsening LE edema, palps, dizziness or syncope  Pt denies new neuro symptoms such as headache, facial or extremity weakness Trying to follow lower chol diet.  Curretnly taking colchrys but too expensive, no recent gout flares.  Also denies polydipsia or polyuria.  No abd pain, n/v/d, bowel change or blood.    Problems Prior to Update: 1)  Cardiac Murmur  (ICD-785.2) 2)  Bradycardia, Chronic  (ICD-427.89) 3)  Hypertension  (ICD-401.9) 4)  Hyperlipidemia  (ICD-272.4) 5)  Fatigue  (ICD-780.79) 6)  Abdominal Pain, Generalized  (ICD-789.07) 7)  Dysphagia Unspecified  (ICD-787.20) 8)  Gastroparesis  (ICD-536.3) 9)  Uti  (ICD-599.0) 10)  Depression  (ICD-311) 11)  Gouty Arthropathy Unspecified  (ICD-274.00) 12)  Menopausal Disorder  (ICD-627.9) 13)  Preventive Health Care  (ICD-V70.0) 14)  Fever Unspecified  (ICD-780.60) 15)  Peripheral Neuropathy  (ICD-356.9) 16)  Abdominal Pain, Chronic  (ICD-789.00) 17)  Lung Nodule  (ICD-518.89) 18)  Stomach Cancer  (ICD-151.9) 19)  Arthritis  (ICD-716.90) 20)  Degenerative Joint Disease, Knees, Bilateral  (ICD-715.96) 21)  Generalized Osteoarthrosis Involving Hand  (ICD-715.04) 22)  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 One By Mouth Qd 2)  Colchicine 0.6 Mg  Tabs (Colchicine) .... Take 1 Tablet By Mouth Once A Day As Needed 3)  Xalatan 0.005 %  Soln (Latanoprost) .... One Drop Each Eye Once Daily 4)  Cymbalta 60 Mg Cpep (Duloxetine Hcl) .Marland Kitchen.. 1po Once Daily 5)  Omeprazole 20 Mg Cpdr (Omeprazole) .Marland Kitchen.. 1 By Mouth Once Daily 6)  Sertraline Hcl 50 Mg Tabs (Sertraline Hcl) .Marland Kitchen.. 1 Tab By Mouth Once Daily  Current Medications (verified): 1)  Azor 10-40 Mg  Tabs (Amlodipine-Olmesartan) .... One By Mouth Qd 2)  Allopurinol 100 Mg Tabs (Allopurinol) .Marland Kitchen.. 1po Once Daily 3)  Xalatan 0.005 %  Soln (Latanoprost) .... One Drop Each Eye Once Daily 4)  Cymbalta 60 Mg Cpep (Duloxetine Hcl) .Marland Kitchen.. 1po Once Daily 5)  Omeprazole 20 Mg Cpdr (Omeprazole) .Marland Kitchen.. 1 By Mouth Once Daily 6)  Pravachol 10 Mg Tabs (Pravastatin Sodium) .Marland Kitchen.. 1po Once Daily  Allergies (verified): 1)  ! Vicodin 2)  ! Penicillin 3)  ! * Dulcolax 4)  * Timoptic  Past History:  Past Surgical History: Last updated: 09/19/2007 Vagotomy Partial Gastrectomy Billroth II gastorenterostomy  Social History: Last updated: 12/24/2009 The patient's widowed and lives with her daughter. She has an  eighth grade education. 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 She used to smoke a pack of cigarettes a day for 25 years but stopped 10 years ago. She does not use alcohol or has a history of alcohol or drug dependency.  Daily Caffeine Use Illicit Drug Use - no  Past Medical History: BRADYCARDIA, CHRONIC (ICD-427.89) HYPERTENSION (ICD-401.9) HYPERLIPIDEMIA (ICD-272.4) FATIGUE (ICD-780.79) ABDOMINAL PAIN, GENERALIZED (ICD-789.07) DYSPHAGIA UNSPECIFIED (ICD-787.20) GASTROPARESIS (ICD-536.3) UTI (ICD-599.0) DEPRESSION (ICD-311) GOUTY ARTHROPATHY UNSPECIFIED  (ICD-274.00) MENOPAUSAL DISORDER (ICD-627.9) PREVENTIVE HEALTH CARE (ICD-V70.0) FEVER UNSPECIFIED (ICD-780.60) PERIPHERAL NEUROPATHY (ICD-356.9) ABDOMINAL PAIN, CHRONIC (ICD-789.00) LUNG NODULE (ICD-518.89) STOMACH CANCER (ICD-151.9) ARTHRITIS (ICD-716.90) DEGENERATIVE JOINT DISEASE, KNEES, BILATERAL (ICD-715.96) GENERALIZED OSTEOARTHROSIS INVOLVING HAND (ICD-715.04) 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 Arline Asp Peripheral neuropathy -due to chemo DJD probable gastroparesis due to vagotomy  Review of Systems       all otherwise negative per pt -    Physical Exam  General:  alert and overweight-appearing.   Head:  normocephalic and atraumatic.   Eyes:  vision grossly intact, pupils equal, and pupils round.   Ears:  R ear normal and L ear normal.   Nose:  no external deformity and no nasal discharge.   Mouth:  no gingival abnormalities and pharynx pink and moist.   Neck:  supple and no masses.   Lungs:  normal respiratory effort and normal breath sounds.   Heart:  normal rate and regular rhythm.   Msk:  bilat knee crepitus, no effusion, has FROM, NT Extremities:  no edema, no erythema    Impression & Recommendations:  Problem # 1:  ARTHRITIS (ICD-716.90) bilat knee - for tylenol OTC as needed   Problem # 2:  HYPERLIPIDEMIA (ICD-272.4)  Her updated medication list for this problem includes:    Pravachol 10 Mg Tabs (Pravastatin sodium) .Marland Kitchen... 1po once daily  Labs Reviewed: SGOT: 33 (12/08/2009)   SGPT: 22 (12/08/2009)   HDL:65.60 (04/09/2010), 64.10 (12/08/2009)  LDL:96 (12/08/2009), 97 (06/15/2009)  Chol:207 (04/09/2010), 186 (12/08/2009)  Trig:111.0 (04/09/2010), 132.0 (12/08/2009) treat as above, f/u any worsening signs or symptoms, Pt to continue diet efforts, ; to check labs - goal LDL less  than 100  Problem # 3:  GOUT (ICD-274.9)  Her updated medication list for this problem includes:    Allopurinol 100 Mg Tabs (Allopurinol) .Marland Kitchen... 1po once daily treat as above, f/u any worsening signs or symptoms   Problem # 4:  DIABETES MELLITUS, TYPE II (ICD-250.00)  Her updated medication list for this problem includes:    Azor 10-40 Mg Tabs (Amlodipine-olmesartan) ..... One by mouth qd  Labs Reviewed: Creat: 1.1 (04/09/2010)    Reviewed HgBA1c results: 5.7 (04/09/2010)  5.8 (06/15/2009) stable overall by hx and exam, ok to continue meds/tx as is - no OHA needed at this time,  Pt to cont DM diet, excercise, wt control  Complete Medication List: 1)  Azor 10-40 Mg Tabs (Amlodipine-olmesartan) .... One by mouth qd 2)  Allopurinol 100 Mg Tabs (Allopurinol) .Marland Kitchen.. 1po once daily 3)  Xalatan 0.005 % Soln (Latanoprost) .... One drop each eye once daily 4)  Cymbalta 60 Mg Cpep (Duloxetine hcl) .Marland Kitchen.. 1po once daily 5)  Omeprazole 20 Mg Cpdr (Omeprazole) .Marland Kitchen.. 1 by mouth once daily 6)  Pravachol 10 Mg Tabs (Pravastatin sodium) .Marland Kitchen.. 1po once daily  Patient Instructions: 1)  you  can take 8 hr tylenol (acetaminophen) as needed OTC 2)  stop the brand name colchicine 3)  start the allopurinol 100 mg  - 1 per day 4)  start the pravachol 10 mg per day 5)  Continue all previous medications as before this visit  6)  Please schedule a follow-up appointment in 6 months with CPX labs Prescriptions: PRAVACHOL 10 MG TABS (PRAVASTATIN SODIUM) 1po once daily  #90 x 3   Entered and Authorized by:   Corwin Levins MD   Signed by:   Corwin Levins MD on 04/15/2010   Method used:   Electronically to        [facility]  [address] [phone][facility] (retail)       [address]       [place], [place]  [id number]       Ph: [phone] or [phone]       Fax: [phone]   RxID:   [id number] ALLOPURINOL 100 MG TABS (ALLOPURINOL) 1po once daily  #30 x 11   Entered and Authorized by:    Corwin Levins MD   Signed by:   Corwin Levins MD on 04/15/2010   Method used:   [facility] to        [facility]  [address] [phone]* (retail)       [address]       [place], [place]  [id number]       Ph: [phone] or [phone]       Fax: [phone]   RxID:   [id number]

## 2010-11-30 NOTE — Letter (Signed)
Summary: [facility]  [facility]   Imported By: Sherian Rein 06/30/2010 11:33:06  _____________________________________________________________________  External Attachment:    Type:   Image     Comment:   External Document

## 2010-11-30 NOTE — Assessment & Plan Note (Signed)
Summary: 6 MO ROV /NWS $50   Vital Signs:  Patient profile:   77 year old female Height:      66 inches Weight:      176.50 pounds BMI:     28.59 O2 Sat:      94 % on Room air Temp:     97.3 degrees F oral Pulse rate:   47 / minute BP sitting:   100 / 50  (left arm) Cuff size:   regular  Vitals Entered ByZella Ball Ewing (December 15, 2009 9:47 AM)  O2 Flow:  Room air  Preventive Care Screening     declines tetanus  CC: 6 Mo ROV/RE   CC:  6 Mo ROV/RE.  History of Present Illness: here for wellness, but has lower abd discomfort but no specific GU symtpoms such as urinary freq, urgency , hematuria, flank pain, fever or n/v.  Not taking the glipizide as her sugar has been good without it with better diet and some wt loss.  Has bradycardia recently noted, even off the eye drops as before.  Has other abd pain it seems with crampy adn sharp pains off and on, not tried OTC meds , denies n/v, obvious reflux or constipation.  Has some wt loss and decreased appetitie, worried about her hx of stomach cancer.  Also with pain and numbness more at night to the legs below the kness, has some mild depressive symptoms as well, celebrex not working for the pain and tramadol does not seem to work at all.  Requeusts handicap form filled out, and had flu shot in the [facility] oct 2010 where she works.    Problems Prior to Update: 1)  Uti  (ICD-599.0) 2)  Depression  (ICD-311) 3)  Gouty Arthropathy Unspecified  (ICD-274.00) 4)  Menopausal Disorder  (ICD-627.9) 5)  Bradycardia, Chronic  (ICD-427.89) 6)  Preventive Health Care  (ICD-V70.0) 7)  Fever Unspecified  (ICD-780.60) 8)  Peripheral Neuropathy  (ICD-356.9) 9)  Bradycardia  (ICD-427.89) 10)  Abdominal Pain, Chronic  (ICD-789.00) 11)  Lung Nodule  (ICD-518.89) 12)  Stomach Cancer  (ICD-151.9) 13)  Arthritis  (ICD-716.90) 14)  Degenerative Joint Disease, Knees, Bilateral  (ICD-715.96) 15)  Generalized Osteoarthrosis Involving Hand  (ICD-715.04) 16)   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 One By Mouth Qd 2)  D 1000 1000 Unit Caps (Cholecalciferol) .... Take 1 Capsule By Mouth Once A Day 3)  Glipizide Xl 2.5 Mg  Tb24 (Glipizide) .... One By Mouth Once Daily As Needed 4)  Colchicine 0.6 Mg  Tabs (Colchicine) .... Take 1 Tablet By Mouth Once A Day As Needed 5)  Xalatan 0.005 %  Soln (Latanoprost) .... One Drop Each Eye Once Daily 6)  [facility]vs Stool Softener 100 Mg  Caps (Docusate Sodium) .... Take 1 Tablet By Mouth Once A Day As Needed 7)  Tylenol Extra Strength 500 Mg  Tabs (Acetaminophen) .... Take 1 Tablet By Mouth Two Times A Day As Needed 8)  Sertraline Hcl 50 Mg Tabs (Sertraline Hcl) .Marland Kitchen.. 1po Once Daily 9)  Tramadol Hcl 50 Mg Tabs (Tramadol Hcl) .Marland Kitchen.. 1 By Mouth Q 6 Hrs As Needed Pain  Current Medications (verified): 1)  Azor 10-40 Mg  Tabs (Amlodipine-Olmesartan) .Marland KitchenMarland KitchenMarland Kitchen  One By Mouth Qd 2)  D 1000 1000 Unit Caps (Cholecalciferol) .... Take 1 Capsule By Mouth Once A Day 3)  Colchicine 0.6 Mg  Tabs (Colchicine) .... Take 1 Tablet By Mouth Once A Day As Needed 4)  Xalatan 0.005 %  Soln (Latanoprost) .... One Drop Each Eye Once Daily 5)  [facility]vs Stool Softener 100 Mg  Caps (Docusate Sodium) .... Take 1 Tablet By Mouth Once A Day As Needed 6)  Tylenol Extra Strength 500 Mg  Tabs (Acetaminophen) .... Take 1 Tablet By Mouth Two Times A Day As Needed 7)  Sertraline Hcl 50 Mg Tabs (Sertraline Hcl) .Marland Kitchen.. 1po Once Daily 8)  Cymbalta 60 Mg Cpep (Duloxetine Hcl) .Marland Kitchen.. 1po Once Daily 9)  Omeprazole 20 Mg Cpdr (Omeprazole) .Marland Kitchen.. 1 By Mouth Once Daily 10)  Ciprofloxacin Hcl 500 Mg Tabs (Ciprofloxacin Hcl) .Marland Kitchen.. 1po Two Times A Day  Allergies (verified): 1)  ! Vicodin 2)  !  Penicillin 3)  * Timoptic  Past History:  Past Medical History: Last updated: 06/17/2009 Diabetes mellitus, type II Gout Hypertension Stomach Cancer - Dr. Arline Asp Peripheral neuropathy -due to chemo glaucoma DJD probable gastroparesis due to vagotomy chronic bradycardia Depression  Past Surgical History: Last updated: 09/19/2007 Vagotomy Partial Gastrectomy Billroth II gastorenterostomy  Family History: Last updated: 06/19/2008 She has a very remarkable family history which is of concern for hereditary colon cancer syndrome. Both her grandfather and grandmother had colon cancer.  father with hemorrhagic stroke several sibs with DM sister with breast cancer sister with uterine cancer sister with ovarian cancer  Social History: Last updated: 09/19/2007 The patient's widowed and lives with her daughter. She has an eighth grade education. 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 She used to smoke a pack of cigarettes a day for 25 years but stopped 10 years ago. She does not use alcohol or has a history of alcohol or drug dependency.   Review of Systems  The patient denies fever, weight gain, vision loss, decreased hearing, hoarseness, chest pain, syncope, dyspnea on exertion, peripheral edema, prolonged cough, headaches, hemoptysis, abdominal pain, melena, hematochezia, severe indigestion/heartburn, hematuria, incontinence, muscle weakness, suspicious skin lesions, transient blindness, difficulty walking, unusual weight change, abnormal bleeding, enlarged lymph nodes, and angioedema.         all otherwise negative per pt -   Physical Exam  General:  alert and overweight-appearing.   Head:  normocephalic and atraumatic.   Eyes:  vision grossly intact, pupils equal, and pupils round.   Ears:  R ear normal and L ear normal.   Nose:  no external deformity and no nasal discharge.   Mouth:  no gingival  abnormalities and pharynx pink and moist.   Neck:  supple and no masses.   Lungs:  normal respiratory effort and normal breath sounds.   Heart:  normal rate and regular rhythm.   Abdomen:  soft, non-tender, and normal bowel sounds.  except for mild epigastric tender and low mid abd tender without guarding or rebound Msk:  no joint tenderness and no joint swelling.   Extremities:  no edema, no erythema  Neurologic:  cranial nerves II-XII intact and strength normal in all extremities.     Impression & Recommendations:  Problem # 1:  Preventive Health Care (ICD-V70.0) Overall doing well, age appropriate education and counseling updated and referral for appropriate preventive services done unless declined, immunizations up to date or declined, diet counseling done if overweight, urged  to quit smoking if smokes , most recent labs reviewed and current ordered if appropriate, ecg reviewed or declined (interpretation per ECG scanned in the EMR if done); information regarding Medicare Prevention requirements given if appropriate   Problem # 2:  BRADYCARDIA, CHRONIC (ICD-427.89) no longer on the eye drop beta -blocker, still with HR in the 40's at home and here,  asympt  but ? worsening overall, little to no symptoms it seems, will ask cardiology to re-eval  Orders: Echo Referral (Echo) Cardiology Referral (Cardiology)  Problem # 3:  PERIPHERAL NEUROPATHY (ICD-356.9) to add the cymbalta 30 once daily for 1 wk, then 60 once daily after that, shoud help with depressive sz's as well   Problem # 4:  ABDOMINAL PAIN, CHRONIC (ICD-789.00)   not clear if really  worsened at this time, does have some lowe abd tender and abnormal UA - to tx with antibx, as well as empiric omperazole, pt also requests f/u with GI /dr patterson who has seen her in the past with her hx of malignancy, has been some better with laxative recent it seems  Orders: Gastroenterology Referral (GI)  Problem # 5:  DIABETES MELLITUS,  TYPE II (ICD-250.00)  The following medications were removed from the medication list:    Glipizide Xl 2.5 Mg Tb24 (Glipizide) ..... One by mouth once daily as needed Her updated medication list for this problem includes:    Azor 10-40 Mg Tabs (Amlodipine-olmesartan) ..... One by mouth qd ok to stop the glipizide at this itme  Labs Reviewed: Creat: 1.2 (12/08/2009)    Reviewed HgBA1c results: 5.8 (06/15/2009)  5.7 (12/17/2008) stable overall by hx and exam, ok to continue meds/tx as is   Problem # 6:  DEPRESSION (ICD-311)  Her updated medication list for this problem includes:    Sertraline Hcl 50 Mg Tabs (Sertraline hcl) .Marland Kitchen... 1po once daily    Cymbalta 60 Mg Cpep (Duloxetine hcl) .Marland Kitchen... 1po once daily consider stop the sertraline, to cont at this time for now, treat as above, f/u any worsening signs or symptoms   Problem # 7:  UTI (ICD-599.0)  Her updated medication list for this problem includes:    Ciprofloxacin Hcl 500 Mg Tabs (Ciprofloxacin hcl) .Marland Kitchen... 1po two times a day to check urine cx as well   Orders: T-Culture, Urine ([id number])  Complete Medication List: 1)  Azor 10-40 Mg Tabs (Amlodipine-olmesartan) .... One by mouth qd 2)  D 1000 1000 Unit Caps (Cholecalciferol) .... Take 1 capsule by mouth once a day 3)  Colchicine 0.6 Mg Tabs (Colchicine) .... Take 1 tablet by mouth once a day as needed 4)  Xalatan 0.005 % Soln (Latanoprost) .... One drop each eye once daily 5)  Cvs Stool Softener 100 Mg Caps (Docusate sodium) .... Take 1 tablet by mouth once a day as needed 6)  Tylenol Extra Strength 500 Mg Tabs (Acetaminophen) .... Take 1 tablet by mouth two times a day as needed 7)  Sertraline Hcl 50 Mg Tabs (Sertraline hcl) .Marland Kitchen.. 1po once daily 8)  Cymbalta 60 Mg Cpep (Duloxetine hcl) .Marland Kitchen.. 1po once daily 9)  Omeprazole 20 Mg Cpdr (Omeprazole) .Marland Kitchen.. 1 by mouth once daily 10)  Ciprofloxacin Hcl 500 Mg Tabs (Ciprofloxacin hcl) .Marland Kitchen.. 1po two times a day  Other Orders: TD  Toxoids IM 7 YR + (09811) Admin 1st Vaccine (91478)  Patient Instructions: 1)  stop the glipizide 2)  you are given the handicap form today 3)  You will be contacted about the referral(s)  to: Cardiology, GI, and the echocardiogram 4)  Please take all new medications as prescribed - the cipro for the bladder, omeprazole for the stomach, and take the cymbalta samples at 30 mg per day for 1 wk, then 60 mg per day after that 5)  in 3 wks, if you like, you can stop the sertraline as the cymbalta can likekly take over for the sertraline 6)  Please go to the Lab in the basement for your urine tests today  7)  Please schedule a follow-up appointment in 3 months with: 8)  BMP prior to visit, ICD-9: 250.02 9)  Lipid Panel prior to visit, ICD-9: 10)  HbgA1C prior to visit, ICD-9: Prescriptions: CIPROFLOXACIN HCL 500 MG TABS (CIPROFLOXACIN HCL) 1po two times a day  #20 x 0   Entered and Authorized by:   Corwin Levins MD   Signed by:   Corwin Levins MD on 12/15/2009   Method used:   Print then Give to Patient   RxID:   [id number] OMEPRAZOLE 20 MG CPDR (OMEPRAZOLE) 1 by mouth once daily  #90 x 3   Entered and Authorized by:   Corwin Levins MD   Signed by:   Corwin Levins MD on 12/15/2009   Method used:   Print then Give to Patient   RxID:   [id number] CYMBALTA 60 MG CPEP (DULOXETINE HCL) 1po once daily  #90 x 3   Entered and Authorized by:   Corwin Levins MD   Signed by:   Corwin Levins MD on 12/15/2009   Method used:   Print then Give to Patient   RxID:   [id number]    Immunization History:  Influenza Immunization History:    Influenza:  historical (07/31/2009)  Immunizations Administered:  Tetanus Vaccine:    Vaccine Type: Td    Site: right deltoid    Mfr: Gl[employer]oSmith[employer]    Dose: 0.5 ml    Route: IM    Given by: Zella Ball Ewing    Exp. Date: 09/15/2011    Lot #: [id number]    VIS given: 09/18/07 version given December 15, 2009.

## 2010-11-30 NOTE — Letter (Signed)
Summary: [facility]   Imported By: Sherian Rein 02/08/2010 12:20:20  _____________________________________________________________________  External Attachment:    Type:   Image     Comment:   External Document

## 2010-11-30 NOTE — Medication Information (Signed)
Summary: [facility]  [facility]   Imported By: Sherian Rein 05/21/2010 09:41:42  _____________________________________________________________________  External Attachment:    Type:   Image     Comment:   External Document

## 2010-12-02 NOTE — Medication Information (Signed)
Summary: Order / [facility]  Order / [facility]   Imported By: Lennie Odor 10/15/2010 11:57:36  _____________________________________________________________________  External Attachment:    Type:   Image     Comment:   External Document

## 2010-12-02 NOTE — Assessment & Plan Note (Signed)
Summary: 6 mos f/u #/cd   Vital Signs:  Patient profile:   77 year old female Height:      66 inches Weight:      178.75 pounds BMI:     28.96 O2 Sat:      93 % on Room air Temp:     97.9 degrees F oral Pulse rate:   65 / minute BP sitting:   92 / 50  (left arm) Cuff size:   regular  Vitals Entered By: Zella Ball Ewing CMA Duncan Dull) (October 15, 2010 9:27 AM)  O2 Flow:  Room air  Preventive Care Screening  Last Flu Shot:    Date:  07/31/2010    Results:  given   CC: 6 month Followup/RE   Primary Care Provider:  Oliver Barre, MD  CC:  6 month Followup/RE.  History of Present Illness: here for wellness and f/u;  Pt denies CP, worsening sob, doe, wheezing, orthopnea, pnd, worsening LE edema, palps, dizziness or syncope Pt denies new neuro symptoms such as headache, facial or extremity weakness  Pt denies polydipsia, polyuria  Overall good compliance with meds, trying to follow low chol diet, wt stable, little excercise however .  Denies worsening depressive symptoms, suicidal ideation, or panic.  No fever, wt loss, night sweats, loss of appetite or other constitutional symptoms Overall good compliance with meds, and good tolerability.  No fever, wt loss, night sweats, loss of appetite or other constitutional symptoms  Pt states good ability with ADL's, low fall risk, home safety reviewed and adequate, no significant change in hearing or vision, trying to follow lower chol diet, and occasionally active only with regular excercise, as she has marked end stage djd knees, too old now for replacements.  No recent falls.  Does have ongoing chronic constipation stable recently with incr pain, n/v.    Preventive Screening-Counseling & Management      Drug Use:  no.    Problems Prior to Update: 1)  Constipation, Chronic  (ICD-564.09) 2)  Cardiac Murmur  (ICD-785.2) 3)  Bradycardia, Chronic  (ICD-427.89) 4)  Hypertension  (ICD-401.9) 5)  Hyperlipidemia  (ICD-272.4) 6)  Fatigue   (ICD-780.79) 7)  Abdominal Pain, Generalized  (ICD-789.07) 8)  Dysphagia Unspecified  (ICD-787.20) 9)  Gastroparesis  (ICD-536.3) 10)  Uti  (ICD-599.0) 11)  Depression  (ICD-311) 12)  Gouty Arthropathy Unspecified  (ICD-274.00) 13)  Menopausal Disorder  (ICD-627.9) 14)  Preventive Health Care  (ICD-V70.0) 15)  Fever Unspecified  (ICD-780.60) 16)  Peripheral Neuropathy  (ICD-356.9) 17)  Abdominal Pain, Chronic  (ICD-789.00) 18)  Lung Nodule  (ICD-518.89) 19)  Stomach Cancer  (ICD-151.9) 20)  Arthritis  (ICD-716.90) 21)  Degenerative Joint Disease, Knees, Bilateral  (ICD-715.96) 22)  Generalized Osteoarthrosis Involving Hand  (ICD-715.04) 23)  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 One By Mouth Qd 2)  Allopurinol 100 Mg Tabs (Allopurinol) .Marland Kitchen.. 1po Once Daily 3)  Xalatan 0.005 %  Soln (Latanoprost) .... One Drop Each Eye Once Daily 4)  Cymbalta 60 Mg Cpep (Duloxetine Hcl) .Marland Kitchen.. 1po Once Daily 5)  Omeprazole 20 Mg Cpdr (Omeprazole) .Marland Kitchen.. 1 By Mouth Once Daily 6)  Pravachol 10 Mg Tabs (Pravastatin Sodium) .Marland Kitchen.. 1po Once Daily  Current Medications (verified): 1)  Azor 5-20 Mg Tabs (  Amlodipine-Olmesartan) .Marland Kitchen.. 1 By Mouth Once Daily 2)  Allopurinol 100 Mg Tabs (Allopurinol) .Marland Kitchen.. 1po Once Daily 3)  Xalatan 0.005 %  Soln (Latanoprost) .... One Drop Each Eye Once Daily 4)  Cymbalta 60 Mg Cpep (Duloxetine Hcl) .Marland Kitchen.. 1po Once Daily 5)  Omeprazole 20 Mg Cpdr (Omeprazole) .Marland Kitchen.. 1 By Mouth Once Daily 6)  Pravachol 10 Mg Tabs (Pravastatin Sodium) .Marland Kitchen.. 1po Once Daily 7)  Hydrocodone-Acetaminophen 5-325 Mg Tabs (Hydrocodone-Acetaminophen) .Marland Kitchen.. 1 By Mouth Two Times A Day As Needed  Allergies (verified): 1)  ! Vicodin 2)  ! Penicillin 3)  ! *  Dulcolax 4)  * Timoptic  Past History:  Past Medical History: Last updated: 04/15/2010 BRADYCARDIA, CHRONIC (ICD-427.89) HYPERTENSION (ICD-401.9) HYPERLIPIDEMIA (ICD-272.4) FATIGUE (ICD-780.79) ABDOMINAL PAIN, GENERALIZED (ICD-789.07) DYSPHAGIA UNSPECIFIED (ICD-787.20) GASTROPARESIS (ICD-536.3) UTI (ICD-599.0) DEPRESSION (ICD-311) GOUTY ARTHROPATHY UNSPECIFIED (ICD-274.00) MENOPAUSAL DISORDER (ICD-627.9) PREVENTIVE HEALTH CARE (ICD-V70.0) FEVER UNSPECIFIED (ICD-780.60) PERIPHERAL NEUROPATHY (ICD-356.9) ABDOMINAL PAIN, CHRONIC (ICD-789.00) LUNG NODULE (ICD-518.89) STOMACH CANCER (ICD-151.9) ARTHRITIS (ICD-716.90) DEGENERATIVE JOINT DISEASE, KNEES, BILATERAL (ICD-715.96) GENERALIZED OSTEOARTHROSIS INVOLVING HAND (ICD-715.04) 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 Arline Asp Peripheral neuropathy -due to chemo DJD probable gastroparesis due to vagotomy  Past Surgical History: Last updated: 09/19/2007 Vagotomy Partial Gastrectomy Billroth II gastorenterostomy  Family History: Last updated: 12/24/2009 She has a very remarkable family history which is of concern for hereditary colon cancer syndrome. Both her paternal grandfather and maternal grandmother had colon cancer.  father with hemorrhagic stroke several sibs with DM sister X2 with breast cancer sister & mother with uterine cancer sister with ovarian cancer  Social History: Last updated: 10/15/2010 The patient's widowed and lives with her daughter. She has an eighth grade education. 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 She used to smoke a pack of cigarettes a day for 25 years but stopped 10 years ago. She does not use alcohol or has a history of alcohol or drug dependency.   Daily Caffeine Use Illicit Drug Use - no Drug use-no  Social History: The patient's widowed and lives with her daughter. She has an eighth grade education. 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 She used to smoke a pack of cigarettes a day for 25 years but stopped 10 years ago. She does not use alcohol or has a history of alcohol or drug dependency.  Daily Caffeine Use Illicit Drug Use - no Drug use-no  Review of Systems  The patient denies anorexia, fever, vision loss, decreased hearing, hoarseness, chest pain, syncope, dyspnea on exertion, peripheral edema, prolonged cough, headaches, hemoptysis, abdominal pain, melena, hematochezia, severe indigestion/heartburn, hematuria, muscle weakness, suspicious skin lesions, transient blindness, depression, unusual weight change, abnormal bleeding, enlarged lymph nodes, and angioedema.         all otherwise negative per pt -    Physical Exam  General:  alert and overweight-appearing.   Head:  normocephalic and atraumatic.   Eyes:  vision grossly intact, pupils equal, and pupils round.   Ears:  R ear normal and L ear normal.   Nose:  no external deformity and no nasal discharge.   Mouth:  no gingival abnormalities and pharynx pink and moist.   Neck:  supple and no masses.   Lungs:  normal respiratory effort and normal breath sounds.   Heart:  normal  rate and regular rhythm.   Abdomen:  soft, non-tender, and normal bowel sounds.  Msk:  bilat knee crepitus, no effusion, has FROM, NT Extremities:  no edema, no erythema  Neurologic:  cranial nerves II-XII intact and strength normal in all extremities.   Skin:  color normal and no rashes.   Psych:  not anxious appearing and not depressed appearing.     Impression & Recommendations:  Problem # 1:  Preventive Health Care (ICD-V70.0) Overall doing well, age appropriate education and counseling updated, referral for preventive  services and immunizations addressed, dietary counseling and smoking status adressed , most recent labs reviewed I have personally reviewed and have noted 1.The patient's medical and social history 2.Their use of alcohol, tobacco or illicit drugs 3.Their current medications and supplements 4. Functional ability including ADL's, fall risk, home safety risk, hearing & visual impairment  5.Diet and physical activities 6.Evidence for depression or mood disorders The patients weight, height, BMI  have been recorded in the chart I have made referrals, counseling and provided education to the patient based review of the above   Problem # 2:  HYPERTENSION (ICD-401.9)  Her updated medication list for this problem includes:    Azor 5-20 Mg Tabs (Amlodipine-olmesartan) .Marland Kitchen... 1 by mouth once daily overcontrolled; ok to reduce the azor to 5/20 mg - 1 per day;    BP today: 92/50 Prior BP: 128/64 (04/15/2010)  Labs Reviewed: K+: 5.0 (10/11/2010) Creat: : 1.0 (10/11/2010)   Chol: 191 (10/11/2010)   HDL: 61.00 (10/11/2010)   LDL: 113 (10/11/2010)   TG: 86.0 (10/11/2010)  Problem # 3:  DEGENERATIVE JOINT DISEASE, KNEES, BILATERAL (ICD-715.96)  Her updated medication list for this problem includes:    Hydrocodone-acetaminophen 5-325 Mg Tabs (Hydrocodone-acetaminophen) .Marland Kitchen... 1 by mouth two times a day as needed end stage bilat - treat as above, f/u any worsening signs or symptoms , consier three times a day if needed  Problem # 4:  CONSTIPATION, CHRONIC (ICD-564.09) add colace two times a day if take the above pain med  Problem # 5:  HYPERLIPIDEMIA (ICD-272.4)  Her updated medication list for this problem includes:    Pravachol 10 Mg Tabs (Pravastatin sodium) .Marland Kitchen... 1po once daily  Labs Reviewed: SGOT: 33 (10/11/2010)   SGPT: 19 (10/11/2010)   HDL:61.00 (10/11/2010), 65.60 (04/09/2010)  LDL:113 (10/11/2010), 96 (04/54/0981)  Chol:191 (10/11/2010), 207 (04/09/2010)  Trig:86.0 (10/11/2010), 111.0  (04/09/2010) stable overall by hx and exam, ok to continue meds/tx as is   Problem # 6:  DEPRESSION (ICD-311)  Her updated medication list for this problem includes:    Cymbalta 60 Mg Cpep (Duloxetine hcl) .Marland Kitchen... 1po once daily stable overall by hx and exam, ok to continue meds/tx as is   Problem # 7:  DIABETES MELLITUS, TYPE II (ICD-250.00)  Her updated medication list for this problem includes:    Azor 5-20 Mg Tabs (Amlodipine-olmesartan) .Marland Kitchen... 1 by mouth once daily  Labs Reviewed: Creat: 1.0 (10/11/2010)    Reviewed HgBA1c results: 5.7 (04/09/2010)  5.8 (06/15/2009) stable overall by hx and exam, ok to continue meds/tx as is   Complete Medication List: 1)  Azor 5-20 Mg Tabs (Amlodipine-olmesartan) .Marland Kitchen.. 1 by mouth once daily 2)  Allopurinol 100 Mg Tabs (Allopurinol) .Marland Kitchen.. 1po once daily 3)  Xalatan 0.005 % Soln (Latanoprost) .... One drop each eye once daily 4)  Cymbalta 60 Mg Cpep (Duloxetine hcl) .Marland Kitchen.. 1po once daily 5)  Omeprazole 20 Mg Cpdr (Omeprazole) .Marland Kitchen.. 1 by mouth once daily 6)  Pravachol  10 Mg Tabs (Pravastatin sodium) .Marland Kitchen.. 1po once daily 7)  Hydrocodone-acetaminophen 5-325 Mg Tabs (Hydrocodone-acetaminophen) .Marland Kitchen.. 1 by mouth two times a day as needed  Patient Instructions: 1)  stop the azor 10/40 mg  2)  start the azor 5/20 mg - 1 per day 3)  we will call in your  pain medication and new azor to [facility] 4)  please call your insurance about the copay cost for the shingles shot;  of the copay is not too high then make a Nurse Appt here for the shot 5)  you should take stool softner two times a day when you take the pain medications 6)  Please schedule a follow-up appointment in 6 months with: 7)  BMP prior to visit, ICD-9: 250.02 8)  Lipid Panel prior to visit, ICD-9: 9)  HbgA1C prior to visit, ICD-9: Prescriptions: AZOR 5-20 MG TABS (AMLODIPINE-OLMESARTAN) 1 by mouth once daily  #90 x 3   Entered and Authorized by:   Corwin Levins MD   Signed by:   Corwin Levins  MD on 10/15/2010   Method used:   Print then Give to Patient   RxID:   [id number] HYDROCODONE-ACETAMINOPHEN 5-325 MG TABS (HYDROCODONE-ACETAMINOPHEN) 1 by mouth two times a day as needed  #60 x 5   Entered and Authorized by:   Corwin Levins MD   Signed by:   Corwin Levins MD on 10/15/2010   Method used:   Print then Give to Patient   RxID:   [id number]    Orders Added: 1)  Est. Patient 65& > [[id number]]

## 2010-12-02 NOTE — Progress Notes (Signed)
Summary: NEEDS RF   Phone Note Call from Patient Call back at Home Phone [phone]   Summary of Call: Pt left vm, req a call back, has questions about an rx she has.  Initial call taken by: Lamar Sprinkles, CMA,  November 24, 2010 1:39 PM  Follow-up for Phone Call        left 2 other messages - needs rx, 2.5mg  glucotrol to go to [facility] pharmacy [phone] Follow-up by: Lamar Sprinkles, CMA,  November 24, 2010 1:53 PM  Additional Follow-up for Phone Call Additional follow up Details #1::        called the patient and her BS has been running between 76 to 106. She has had 2 cortisone injections from her othopedic and they suggested she get her Glucotrol 2.5 refilled to have on hand if BS goes up due to injections. Her pharmacy is [facility] number [phone]. Additional Follow-up by: Robin Ewing CMA Duncan Dull),  November 24, 2010 2:05 PM    Additional Follow-up for Phone Call Additional follow up Details #2::    ok , but I would only use for blood sugar > 150 - to robin to handle Follow-up by: Corwin Levins MD,  November 24, 2010 2:38 PM  Additional Follow-up for Phone Call Additional follow up Details #3:: Details for Additional Follow-up Action Taken: called pt. informed of JWJ directions and faxed prescription to patients pharmacy. Additional Follow-up by: Robin Ewing CMA Duncan Dull),  November 24, 2010 2:59 PM  New/Updated Medications: GLUCOTROL XL 2.5 MG XR24H-TAB (GLIPIZIDE) 1 by mouth once daily as needed Prescriptions: GLUCOTROL XL 2.5 MG XR24H-TAB (GLIPIZIDE) 1 by mouth once daily as needed  #30 x 1   Entered by:   Scharlene Gloss CMA (AAMA)   Authorized by:   Corwin Levins MD   Signed by:   Scharlene Gloss CMA (AAMA) on 11/24/2010   Method used:   Faxed to ...       [facility] (retail)       [address]       [address]       [place], [place]  [address]  [place]       Ph: [phone]       Fax: [phone]   RxID:   [id number]

## 2010-12-03 NOTE — Miscellaneous (Signed)
Summary: PT & OT orders/[facility]  PT & OT orders/[facility]   Imported By: Sherian Rein 06/29/2010 11:27:39  _____________________________________________________________________  External Attachment:    Type:   Image     Comment:   External Document

## 2010-12-10 ENCOUNTER — Telehealth: Payer: Self-pay | Admitting: Internal Medicine

## 2010-12-16 NOTE — Progress Notes (Signed)
  Phone Note Refill Request Message from:  Fax from Pharmacy on December 10, 2010 10:43 AM  Refills Requested: Medication #1:  OMEPRAZOLE 20 MG CPDR 1 by mouth once daily   Dosage confirmed as above?Dosage Confirmed   Notes: [facility] Initial call taken by: Zella Ball Ewing CMA Duncan Dull),  December 10, 2010 10:43 AM    Prescriptions: OMEPRAZOLE 20 MG CPDR (OMEPRAZOLE) 1 by mouth once daily  #90 x 3   Entered by:   Scharlene Gloss CMA (AAMA)   Authorized by:   Corwin Levins MD   Signed by:   Scharlene Gloss CMA (AAMA) on 12/10/2010   Method used:   Faxed to ...       [facility] (retail)       [address]       [address]       [place], [place]  [address]  [place]       Ph: [phone]       Fax: [phone]   RxID:   [id number]

## 2011-01-23 LAB — GLUCOSE, CAPILLARY
Glucose-Capillary: 128 mg/dL — ABNORMAL HIGH (ref 70–99)
Glucose-Capillary: 77 mg/dL (ref 70–99)

## 2011-02-08 ENCOUNTER — Other Ambulatory Visit: Payer: Self-pay

## 2011-02-08 MED ORDER — PRAVASTATIN SODIUM 10 MG PO TABS: 10.0000 mg | ORAL_TABLET | Freq: Every evening | ORAL | Status: DC

## 2011-02-08 NOTE — Telephone Encounter (Signed)
Pharmacy requesting refill on pravastatin 10 mg. Last refill 04/15/10 #90 with 3 refills and last OV 09/2010.

## 2011-02-08 NOTE — Telephone Encounter (Signed)
Called pharmacy and informed of refill information 

## 2011-03-11 LAB — HM MAMMOGRAPHY

## 2011-03-15 ENCOUNTER — Telehealth: Payer: Self-pay

## 2011-03-15 ENCOUNTER — Telehealth: Payer: Self-pay | Admitting: Internal Medicine

## 2011-03-15 NOTE — Telephone Encounter (Signed)
A user error has taken place: encounter opened in error, closed for administrative reasons.

## 2011-03-15 NOTE — Telephone Encounter (Signed)
Refill-colcrys 0.6mg  tablet. Take 1 tablet by mouth daily as needed. Qty 30. Last fill 4.10.12

## 2011-03-15 NOTE — Assessment & Plan Note (Signed)
[facility]                         GASTROENTEROLOGY OFFICE NOTE   NAME:EVERETTLevy, Wellman                       MRN:          [id number]  DATE:03/06/2008                            DOB:          Feb 25, 1927    Ms. Cuadros continues to have early satiety and epigastric pain when  over-eating.  She is status post partial gastric resection with a  Billroth II, gastroenterostomy, and a previous history of stomach  carcinoma.  I did her endoscopy in June, 2008 which otherwise was  unremarkable.  She is followed by oncology and has a scheduled CT scan  of her abdomen within the next few weeks.  She says she has had a  previous excellent response to metoclopramide 10 mg 3 times a day before  meals but had to come see [place] before this was renewed.  She denies any  neurologic problems, movement disorder, or tremor related to this  medication.   The patient, as per her primary care notes, does have adult-onset  diabetes.  She also has a rather low pulse and is concerned about such,  but she has not informed her primary care physician.   PHYSICAL EXAMINATION:  She is awake and alert in no acute distress.  She  weighs 181 pounds, which is down some 17 pounds from June of last year.  Blood pressure 140/72.  Pulse was 52 and regular.  ABDOMEN:  Unremarkable without organomegaly, masses, or tenderness.  Bowel sounds were normal.   ASSESSMENT:  Ms. Baumgarner does seem to have gastroparesis associated with  her previous surgery and her diabetes.  I see no need to repeat her  endoscopic exam at this time.   RECOMMENDATIONS:  I have renewed her metoclopramide 10 mg 30 minutes  before meals three times a day, and we will see how she does  symptomatically.  I will send this note to Dr. Artist Pais, who follows her  medically, and she may need to adjust her cardiac medications, which she  takes for hypertension because of her bradycardia.     Vania Rea. Jarold Motto, MD, Caleen Essex,  FAGA  Electronically Signed    DRP/MedQ  DD: 03/06/2008  DT: 03/06/2008  Job #: [id number]   cc:   Barbette Hair. Artist Pais, DO  Samul Dada, M.D.

## 2011-03-15 NOTE — Assessment & Plan Note (Signed)
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 She is referred today for  evaluation of gastric carcinoma which was diagnosed several years ago  with surgery in [place], [place].   Shari Mcmahon currently comes to [place] because of early satiety and  progressive weight loss. She also describes some dysphagia for solid  foods in her distal substernal area. She has had mild constipation but  no melena or hematochezia, no nausea and vomiting, or systemic  complaints or hepatobiliary problems. She denies abuse of NSAIDs,  alcohol, or cigarettes.   She apparently was diagnosed as having gastric cancer in December 2005  when she presented with a perforated ulcer after taking Voltaren because  of her degenerative arthritis. At the time of her surgery, she had a  vagotomy, partial gastrectomy, and Billroth II gastroenterostomy. The  only records I have concerning this are from Dr. Mamie Levers excellent  note of Mar 02, 2007. The patient was followed in [place] by Dr. Dan Humphreys  and she apparently had endoscopy in October 2007, also a colonoscopy. At  that time, she also had a CT scan, and apparently all of these were  unremarkable. At the time of her original surgery, there was no evidence  of vascular invasion and all lymph nodes were negative and she was  classified as having a T2B, N0, M0, IB carcinoma. She underwent  postoperative treatment with radiation and 5FU leucovorin from January  2006 to June 2006. Since that time, she apparently has been disease  free.   PAST MEDICAL HISTORY:  Remarkable for essential hypertension, adult  onset diabetes and rather marked degenerative arthritis  especially in  her knees.   CURRENT MEDICATIONS:  1. Tramadol p.r.n.  2. Benicar 20-12.5 daily.  3. Gabapentin 200 mg t.i.d.  4. Glipizide ER 2.5 mg a day.  5. Xalatan 12.5-2.5 daily.  6. Lyrica 50 mg a day.  7. Vitamin D 1000 units twice a day.  8. Timolol eyedrops.  9. Hydrocodone p.r.n. for pain.   She does have a history of PENICILLIN allergy.   FAMILY HISTORY:  She has a very remarkable family history which is of  concern for hereditary colon cancer syndrome. Both her grandfather and  grandmother had colon cancer. Her mother had uterine cancer. She has one  sister with breast cancer and one sister with ovarian cancer. She says  she has 6 children who have been screened with a colonoscopy and she is  unsure as to whether or not they have had colonic polyposis.   SOCIAL HISTORY:  The patient's widowed and lives with her daughter. She  has an eighth grade education. 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 She  used to smoke a pack of cigarettes a day for 25  years but stopped 10  years ago. She does not use alcohol or has a history of alcohol or drug  dependency.   REVIEW OF SYSTEMS:  Remarkable for rather marked pain in her knees and  lower extremities but she currently denies NSAID use. She had some mild  deafness. She denies any current cardiovascular, pulmonary,  genitourinary, neurologic, endocrine or psychiatric difficulties  otherwise. Review of systems otherwise noncontributory.   PHYSICAL EXAMINATION:  GENERAL:  She is a healthy-appearing, nontoxic,  black female in no distress appearing younger than her stated age. She  is awake and alert, oriented, and very conversive.  VITAL SIGNS:  She is 5 feet 6 inches tall and weighs 198 pounds. Blood  pressure is 136/58, pulse was 56 and regular. I could not appreciate  stigmata of chronic liver disease, thyromegaly or lymphadenopathy. There  were no lymph  nodes in her supraclavicular areas.  CHEST:  Entirely clear to percussion and auscultation.  HEART:  She was in a regular rhythm without murmur, gallop or rub.  ABDOMEN:  There was no hepatosplenomegaly, abdominal masses or  tenderness. I could not appreciate a succussion splash in the epigastric  area.  RECTAL:  Unremarkable and stool was guaiac negative.  EXTREMITIES:  Peripheral extremities showed some bony thickening of her  knees but no edema or phlebitis.  MENTAL STATUS:  Clear.   ASSESSMENT:  1. Elderly black female who apparently had carcinoma of the body of      her stomach with partial resection and Billroth 2      gastroenterostomy. She did not have metastatic disease and had      chemoradiation therapy.  2. Early satiety probably related to her surgery and vagotomy with      probably gastroparesis - rule out obstructive lesion which I doubt      since she has had endoscopy within the last year.  3. Mild constipation related to decreased p.o. intake. She has had      previous negative colonoscopy.  4. Very strong family history of malignancy of both the GYN and      gastrointestinal tract certainly suggesting a Lynch syndrome in      this family.  5. Well controlled essential hypertension without history of other      cardiovascular sequelae.  6. Adult onset diabetes mellitus controlled with oral medications.  7. Rather severe degenerative arthritis with history of previous NSAID      abuse and perforated ulcer.  8. History of glaucoma treated with eyedrops.   RECOMMENDATIONS:  1. Will try to obtain more records from [facility] for review.  2. Outpatient endoscopy at her convenience and we may be able to try      low dose Reglan if there is no evidence of obstruction.  3. Will consult with Dr. Arline Asp but I would suspect that genetic      evaluation and screening for Lynch syndrome would seem in order      which would be of use for her relatives. 4. Continue other  medications per Dr. Artist Pais.  5. Have empirically placed on her Nexium 40 mg a day until her workup      can be completed.     Vania Rea. Jarold Motto, MD, [facility], FAGA  Electronically Signed    DRP/MedQ  DD: 04/27/2007  DT: 04/27/2007  Job #: [id number]   cc:   Barbette Hair. Artist Pais, DO  Samul Dada, M.D.

## 2011-03-15 NOTE — Assessment & Plan Note (Signed)
[facility]                            CARDIOLOGY OFFICE NOTE   Shari Mcmahon, Shari Mcmahon                       MRN:          [id number]  DATE:07/08/2008                            DOB:          24-Aug-1927    A 77 year old patient referred for bradycardia, fatigue, and question  PVCs.   In talking to the patient, she has not had a previous cardiac problem.  She has ambulated with a cane or walker for about 3 years.  She  describes herself as extremely depressed.  She lives in assisted living.  She is widower with 6 children.  She says her fatigue and listlessness  has been going on for while in my mind and with her interview, there is  no relationship between this and her heart rate, she is always run a low  heart rate.  She describes many years ago not being able to take beta  blockers due to her slow heart rate.  She recently had her atenolol  stopped by her eye doctor due to heart rates apparently in the mid 40s.   There is also question of previous PVCs, but I have no documentation of  this.  Her chart has no old EKGs.  Her monitor strips in it.   She has never had coronary artery disease.  She has had a heart murmur  in the past and apparently has some aortic sclerosis.   Looking back through her records from Shari Mcmahon and Shari Mcmahon, it  appears that she has had documented pulses in the 50s for quite some  time.   REVIEW OF SYSTEMS:  Otherwise, negative in particular she is not having  palpitations, presyncope, or syncope.  She has significant knee pain,  which limits her ambulation and she has significant depression.   PAST MEDICAL HISTORY:  Remarkable for gastric cancer with a resection in  2006, type 2 diabetes, history of osteoarthritis, history of lung  nodule, hypertension, and neuropathy secondary to chemo.   She also has a history of gout and glaucoma.   MEDICATIONS:  She is on tramadol p.r.n., gabapentin, and vitamin, Azor  10/40,  Xalatan, metoclopramide 10 t.i.d., and colchicine 0.6 a day.   FAMILY HISTORY:  Noncontributory.   SOCIAL HISTORY:  She is widowed.  She lives in assisted living.  Her son  was with her who is her primary care giver.  She does ambulate with a  walker.  She does not smoke or drink.  She describes herself as  significantly depressed and listless.   PHYSICAL EXAMINATION:  GENERAL:  Remarkable for an elderly black female  who is dentulous, affect is subdued.  VITAL SIGNS:  Weight is 187, blood pressure 138/64, pulse 58 and  regular, respiratory rate 14, and afebrile.  HEENT:  Unremarkable.  NECK:  Carotids are normal without bruit.  No lymphadenopathy,  thyromegaly, or JVP elevation.  LUNGS:  Clear with good diaphragmatic motion.  No wheezing.  S1 second  heart sound is preserved.  There is a mild AS murmur.  PMI normal.  ABDOMEN:  Benign.  Status post gastric surgery.  No AAA, no tenderness,  no bruit, no hepatosplenomegaly, no hepatojugular reflux, and no  tenderness.  EXTREMITIES:  Distal pulses are intact.  No edema.  NEURO:  Nonfocal.  SKIN:  Warm and dry.  No muscular weakness.   EKG shows sinus rhythm with no heart block, PR interval is normal at  154, QRS duration is 90, and QT is 428.   IMPRESSION:  1. Relative bradycardia, asymptomatic.  No indication for pacemaker,      avoid AV nodal blocking drugs including timolol.  2. Hypertension, currently well controlled.  Continue current dose of      Azor.  3. Hyperlipidemia.  Lipid and liver profile per Shari Mcmahon.  Low      cholesterol diet.  4. Systolic ejection murmur, mild aortic stenosis.  Consider followup      echo in a year.  5. Significant depression.  I think this and the patient's      osteoarthritis in the knees her biggest issues in regards to her      symptomatic immobility and listlessness and fatigue.  She would      benefit from an selective serotonin reuptake inhibitor.  Continue      tramadol for her knee  pain.  6. History of gout.  Continue colchicine.  Consider addition of      allopurinol if uric acid elevated.  7. Neuropathy secondary to previous chemotherapy, continue gabapentin.      The patient will be seen on an as-needed basis from Cardiology      standpoint.  There is no indication for pacemaker at this point.     Shari Mcmahon. Shari Emms, MD, [facility]  Electronically Signed    PCN/MedQ  DD: 07/08/2008  DT: 07/09/2008  Job #: [id number]   cc:   Shari Levins, MD

## 2011-03-15 NOTE — Assessment & Plan Note (Signed)
[facility]                           PRIMARY CARE OFFICE NOTE   RUBERTA, HOLCK                       MRN:          [id number]  DATE:04/05/2007                            DOB:          September 05, 1927    CHIEF COMPLAINT:  New patient to practice.   HISTORY OF PRESENT ILLNESS:  The patient is a 77 year old African  American female who is here to establish continuity of care. She was  referred by Dr. Arline Asp. She moved to [place] from [place], [place]. She  previously lived in [place] in the past. She was diagnosed with  gastric cancer in December 2006 and underwent gastric resection that  removed 2/3 of her stomach. Since then, she has had follow up with a  gastroenterologist in [place], [place]. She complains of post-prandial  abdominal pain, especially when she overeats. She denies any vomiting  and notes taking anti-gas pills alleviates her symptoms better than  proton pump inhibitors.   She also has a history of type-2 diabetes for the last 14 years. She has  been on p.r.n. Glucotrol, but has been mainly controlled with her diet.  Since her stomach surgery, her blood sugars have significantly improved,  and without Glucotrol, her a.m. sugars are routinely in the 90s-110s.   She complains of chronic knee pain. A review of E-chart notes a history  of chondrocalcinosis, it is unclear whether she is followed by a  orthopedic physician. She is able to ambulate with a cane. She was  previously using a walker.   PAST MEDICAL HISTORY SUMMARY:  1. Gastric cancer, status post resection in December 2006.  2. Type-2 diabetes, diet controlled.  3. History of osteoarthritis of her knees.  4. History of lung nodule.  5. Hypertension.  6. Neuropathy secondary to chemotherapy.   CURRENT MEDICATIONS:  1. Tramadol p.r.n.  2. Benicar 20/12.5 1 daily.  3. Gabapentin 100 mg 2 capsules 3 times a day.  4. Glipizide ER 2.5 mg p.r.n.  5. Xalatan eye drops.  6.  Timolol eye drops.   ALLERGIES TO MEDICATIONS:  DULCOLAX.   SOCIAL HISTORY:  The patient has been widowed for 12 years. She is  retired from being a Lawyer at [employer]. She has six children,  3 daughters, and 3 sons.   FAMILY HISTORY:  Mother died at age 59 due to complications from  childbirth. Father died at age 43 from hypertensive cerebral hemorrhage.  She has three sisters and two brothers that have type-2 diabetes. Sister  noted to have breast cancer and another sister noted to have uterine  cancer.   HABITS:  No alcohol. She quit tobacco 15 years ago. She has  approximately a 15 pack year history.   PHYSICIAN ROSTER:  1. Dr. Raeanne Barry, oncologist in [place], [place].  2. Dr. Arline Asp, oncology in [place], [place].  3. Dr. Fransico Him, gastroenterologist in [place], [place].  4. Dr. Tad Moore, primary care in [place], [place].   REVIEW OF SYSTEMS:  No HEENT symptoms. GI symptoms as noted above. She  denies any chest pain, shortness of breath, or chronic cough.  Musculoskeletal complaints as  noted above. The patient has difficulty  arising from a chair without using her upper extremity muscles. She has  heavy sensation in her lower extremities.  All other systems are  negative.   PHYSICAL EXAMINATION:  VITAL SIGNS:  Weight 199 pounds, temperature  97.2, pulse 44, blood pressure 153/65 in the left arm in the seated  position.  GENERAL:  The patient is a pleasant 77 year old [place] American female  who appear her stated age.  HEENT:  Normocephalic atraumatic. Pupils equal, round, and reactive to  light bilaterally, extraocular motility was intact, the patient was  anicteric, conjunctivae was within normal limits. External auditory  canals and tympanic membranes were clear bilaterally, hearing was  grossly normal. Oropharyngeal exam was unremarkable.  NECK:  Supple. I could not appreciate any carotid bruit or adenopathy.  CHEST:  Normal inspiratory effort. The chest was clear to  auscultation  bilaterally, no rales, rhonchi, or wheezing.  CARDIOVASCULAR:  Regular rate and rhythm, no significant murmurs, rubs,  or gallops appreciated.  ABDOMEN:  Slightly protuberant, but nontender, positive bowel sounds, no  organomegaly.  MUSCULOSKELETAL:  The patient did not have any significant lower  extremity edema. She had decreased pedis systolic pulses, more in her  right foot than left. She had difficulty arising from a chair  unassisted, but was able to get up on the platform to the exam table  with minimal assistance.  NEUROLOGIC:  Cranial nerves II-XII were grossly intact. She was non-  focal.   IMPRESSION:  1. History of gastric cancer status post resection with intermittent      postprandial abdominal pain.  2. Type-2 diabetes, diet controlled.  3. Hypertension, suboptimally controlled with history of white coat      syndrome.  4. History of lung nodule.  5. History of glaucoma.  6. Chronic osteoarthritis of the bilateral knees with ambulation      dysfunction.  7. Health maintenance.   RECOMMENDATIONS:  1. The patient will be referred to [facility] for follow up care      regarding gastric cancer. Dr. Arline Asp has deferred repeating CAT      scan of her abdomen and pelvis. Her Gabapentin will be held      temporarily while we try Lyrica 50md tid. She noted a heavy      sensation in her lower extremities that were refractory to      Gabapentin.  2. We will send her for diabetic labs, check lipids, and check her      vitamin D level.  3. I encouraged the patient to maintain a blood pressure log.      Considering diabetes, we established a goal of a blood pressure      less than 130/80.  4. I have refilled her other medications, and we will arrange follow      up in approximately 4-6 weeks.     Barbette Hair. Artist Pais, DO  Electronically Signed    RDY/MedQ  DD: 04/05/2007  DT: 04/06/2007  Job #: [id number]

## 2011-03-15 NOTE — Telephone Encounter (Signed)
Patient is under the care of Dr Oliver Barre. Refill denied for Colcrys

## 2011-04-08 ENCOUNTER — Other Ambulatory Visit: Payer: Self-pay

## 2011-04-08 MED ORDER — ALLOPURINOL 100 MG PO TABS: 100.0000 mg | ORAL_TABLET | Freq: Every day | ORAL | Status: DC

## 2011-04-12 ENCOUNTER — Other Ambulatory Visit (INDEPENDENT_AMBULATORY_CARE_PROVIDER_SITE_OTHER): Payer: Self-pay

## 2011-04-12 ENCOUNTER — Other Ambulatory Visit: Payer: Self-pay | Admitting: Internal Medicine

## 2011-04-12 LAB — LIPID PANEL
Total CHOL/HDL Ratio: 3
Triglycerides: 74 mg/dL (ref 0.0–149.0)

## 2011-04-12 LAB — BASIC METABOLIC PANEL
CO2: 25 mEq/L (ref 19–32)
Calcium: 9.1 mg/dL (ref 8.4–10.5)
Creatinine, Ser: 1.2 mg/dL (ref 0.4–1.2)
GFR: 54.74 mL/min — ABNORMAL LOW (ref >60.00)
Glucose, Bld: 97 mg/dL (ref 70–99)

## 2011-04-12 LAB — HEMOGLOBIN A1C: Hgb A1c MFr Bld: 6 % (ref 4.6–6.5)

## 2011-04-15 ENCOUNTER — Other Ambulatory Visit: Payer: Self-pay

## 2011-04-15 MED ORDER — HYDROCODONE-ACETAMINOPHEN 5-325 MG PO TABS: 1.0000 | ORAL_TABLET | Freq: Two times a day (BID) | ORAL | Status: DC | PRN

## 2011-04-15 NOTE — Telephone Encounter (Signed)
Faxed hardcopy to pharmacy. 

## 2011-04-19 ENCOUNTER — Encounter: Payer: Self-pay | Admitting: Internal Medicine

## 2011-04-19 ENCOUNTER — Ambulatory Visit (INDEPENDENT_AMBULATORY_CARE_PROVIDER_SITE_OTHER): Payer: Medicare Other | Admitting: Internal Medicine

## 2011-04-19 VITALS — BP 110/60 | HR 54 | Temp 97.9°F | Ht 66.0 in | Wt 182.5 lb

## 2011-04-19 DIAGNOSIS — M109 Gout, unspecified: Secondary | ICD-10-CM

## 2011-04-19 DIAGNOSIS — E119 Type 2 diabetes mellitus without complications: Secondary | ICD-10-CM

## 2011-04-19 DIAGNOSIS — E785 Hyperlipidemia, unspecified: Secondary | ICD-10-CM

## 2011-04-19 DIAGNOSIS — I498 Other specified cardiac arrhythmias: Secondary | ICD-10-CM

## 2011-04-19 DIAGNOSIS — I10 Essential (primary) hypertension: Secondary | ICD-10-CM

## 2011-04-19 DIAGNOSIS — Z0000 Encounter for general adult medical examination without abnormal findings: Secondary | ICD-10-CM | POA: Insufficient documentation

## 2011-04-19 MED ORDER — AMLODIPINE BESYLATE 5 MG PO TABS: 5.0000 mg | ORAL_TABLET | Freq: Every day | ORAL | Status: DC

## 2011-04-19 MED ORDER — LOSARTAN POTASSIUM 50 MG PO TABS: 50.0000 mg | ORAL_TABLET | Freq: Every day | ORAL | Status: DC

## 2011-04-19 MED ORDER — GLIPIZIDE ER 2.5 MG PO TB24: ORAL_TABLET | ORAL | Status: DC

## 2011-04-19 NOTE — Progress Notes (Signed)
Subjective:    Patient ID: Shari Mcmahon, female    DOB: 05-26-1927, 77 y.o.   MRN: [id number]  HPI Here to f/u; overall doing ok,  Pt denies chest pain, increased sob or doe, wheezing, orthopnea, PND, increased LE swelling, palpitations, dizziness or syncope.  Pt denies new neurological symptoms such as new headache, or facial or extremity weakness or numbness   Pt denies polydipsia, polyuria, or low sugar symptoms such as weakness or confusion improved with po intake.  Pt states overall good compliance with meds, trying to follow lower cholesterol, diabetic diet, wt overall stable but little exercise however.  Only takes the glucotrol prn,  Which she did for recent cortisone injections to both knees per Dr Buena Irish for 2 days. Asks also for Pennsaid topical today as a friend did well with this to help with pain.  Trying to be more active,  Has BP checked twice monthly per nurse at [facility], and all range from 98-127 SBP, asympt.  Walks with cane, last fall early 2011.   HR's have been 45-59, again asympt. Past Medical History  Diagnosis Date  . ABDOMINAL PAIN, CHRONIC 04/07/2008  . Abdominal pain, generalized 12/24/2009  . ARTHRITIS 03/03/2008  . BRADYCARDIA, CHRONIC 12/17/2008  . CARDIAC MURMUR 02/01/2010  . CONSTIPATION, CHRONIC 10/15/2010  . DEGENERATIVE JOINT DISEASE, KNEES, BILATERAL 12/07/2007  . DEPRESSION 06/17/2009  . DIABETES MELLITUS, TYPE II 09/19/2007  . DYSPHAGIA UNSPECIFIED 12/24/2009  . FATIGUE 01/27/2010  . FEVER UNSPECIFIED 09/30/2008  . Gastroparesis 12/24/2009  . GENERALIZED OSTEOARTHROSIS INVOLVING HAND 10/03/2007  . GLAUCOMA 09/19/2007  . GOUTY ARTHROPATHY UNSPECIFIED 02/17/2009  . GOUT 09/19/2007  . HYPERLIPIDEMIA 01/27/2010  . HYPERTENSION 09/19/2007  . LUNG NODULE 03/03/2008  . MENOPAUSAL DISORDER 12/17/2008  . PERIPHERAL NEUROPATHY 06/19/2008  . PERSONAL HISTORY MALIGNANT NEOPLASM STOMACH 09/19/2007  . 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 STOMACH  CANCER 03/03/2008  . UTI 12/15/2009   Past Surgical History  Procedure Date  . Vagotomy   . Partial gastrectomy   . Billroth ii gastorenterostomy     reports that she has quit smoking. She does not have any smokeless tobacco history on file. She reports that she does not drink alcohol or use illicit drugs. family history includes Cancer in her maternal grandmother, mother, paternal grandfather, and sister; Diabetes in her other; and Stroke in her father. Allergies  Allergen Reactions  . Bisacodyl   . Hydrocodone-Acetaminophen     REACTION: Rash  . Penicillins   . Timolol     REACTION: bradycardia worse   Current Outpatient Prescriptions on File Prior to Visit  Medication Sig Dispense Refill  . allopurinol (ZYLOPRIM) 100 MG tablet Take 1 tablet (100 mg total) by mouth daily.  30 tablet  7  . amLODipine-olmesartan (AZOR) 5-20 MG per tablet Take 1 tablet by mouth daily.        Marland Kitchen glipiZIDE (GLUCOTROL) 2.5 MG 24 hr tablet Take 2.5 mg by mouth daily.        Marland Kitchen HYDROcodone-acetaminophen (NORCO) 5-325 MG per tablet Take 1 tablet by mouth 2 (two) times daily as needed.  60 tablet  0  . latanoprost (XALATAN) 0.005 % ophthalmic solution Place 1 drop into both eyes daily.        Marland Kitchen omeprazole (PRILOSEC) 20 MG capsule Take 20 mg by mouth daily.        . pravastatin (PRAVACHOL) 10 MG tablet Take 1 tablet (10 mg total) by mouth every evening.  90 tablet  3  .  DULoxetine (CYMBALTA) 60 MG capsule Take 60 mg by mouth daily.         Review of Systems Review of Systems  Constitutional: Negative for diaphoresis and unexpected weight change.  HENT: Negative for drooling and tinnitus.   Eyes: Negative for photophobia and visual disturbance.  Respiratory: Negative for choking and stridor.   Gastrointestinal: Negative for vomiting and blood in stool.  Genitourinary: Negative for hematuria and decreased urine volume.  Musculoskeletal: Negative for gait problem. except for above Skin: Negative for color  change and wound.    Objective:   Physical Exam BP 110/60  Pulse 54  Temp(Src) 97.9 F (36.6 C) (Oral)  Ht 5\' 6"  (1.676 m)  Wt 182 lb 8 oz (82.781 kg)  BMI 29.46 kg/m2  SpO2 96% Physical Exam  VS noted Constitutional: Pt appears well-developed and well-nourished.  HENT: Head: Normocephalic.  Right Ear: External ear normal.  Left Ear: External ear normal.  Eyes: Conjunctivae and EOM are normal. Pupils are equal, round, and reactive to light.  Neck: Normal range of motion. Neck supple.  Cardiovascular: Normal rate and regular rhythm.   Pulmonary/Chest: Effort normal and breath sounds normal.  Abd:  Soft, NT, non-distended, + BS Neurological: Pt is alert. No cranial nerve deficit.  Skin: Skin is warm. No erythema.  Psychiatric: Pt behavior is normal. Thought content normal.         Assessment & Plan:

## 2011-04-19 NOTE — Assessment & Plan Note (Signed)
Readings a bit overcontrolled, I think despite her asympt status - will ask pt to decr the azor to the 5-10 mg;   to f/u any worsening symptoms or concerns

## 2011-04-19 NOTE — Assessment & Plan Note (Signed)
Chronic, stable, asympt - ok to follow for now, avoid neg chronotropics

## 2011-04-19 NOTE — Patient Instructions (Signed)
Please finish the current bottle of your Azor 5-20 mg, then stop After that, you will need to change to TWO generic prescriptions:  Amlodipine 5 mg per day, and Losartan 50 mg per day Continue all other medications as before You are given the handicap form today Please return in 6 mo with Lab testing done 3-5 days before

## 2011-04-19 NOTE — Assessment & Plan Note (Signed)
stable overall by hx and exam, most recent data reviewed with pt, and pt to continue medical treatment as before  Lab Results  Component Value Date   HGBA1C 6.0 04/12/2011

## 2011-04-19 NOTE — Assessment & Plan Note (Signed)
stable overall by hx and exam, most recent data reviewed with pt, and pt to continue medical treatment as before  Lab Results  Component Value Date   LDLCALC 77 04/12/2011

## 2011-04-23 ENCOUNTER — Encounter: Payer: Self-pay | Admitting: Internal Medicine

## 2011-04-23 NOTE — Assessment & Plan Note (Signed)
asympt - stable overall by hx and exam, most recent data reviewed with pt, and pt to continue medical treatment as before  Lab Results  Component Value Date   WBC 5.2 10/11/2010   HGB 11.6* 10/11/2010   HCT 35.0* 10/11/2010   PLT 149.0* 10/11/2010   CHOL 152 04/12/2011   TRIG 74.0 04/12/2011   HDL 60.40 04/12/2011   LDLDIRECT 124.3 04/09/2010   ALT 19 10/11/2010   AST 33 10/11/2010   NA 139 04/12/2011   K 5.4* 04/12/2011   CL 110 04/12/2011   CREATININE 1.2 04/12/2011   BUN 22 04/12/2011   CO2 25 04/12/2011   TSH 0.75 10/11/2010   HGBA1C 6.0 04/12/2011   MICROALBUR 1.2 06/15/2009

## 2011-05-12 ENCOUNTER — Other Ambulatory Visit: Payer: Self-pay | Admitting: *Deleted

## 2011-05-12 MED ORDER — HYDROCODONE-ACETAMINOPHEN 5-325 MG PO TABS: 1.0000 | ORAL_TABLET | Freq: Two times a day (BID) | ORAL | Status: DC | PRN

## 2011-05-12 NOTE — Telephone Encounter (Signed)
Rx faxed to [facility]. 

## 2011-05-12 NOTE — Telephone Encounter (Signed)
R'cd fax from [facility] for refill of Hydrocodone-APAP-last filled 04/15/2011-please advise

## 2011-08-11 ENCOUNTER — Other Ambulatory Visit: Payer: Self-pay

## 2011-08-11 MED ORDER — HYDROCODONE-ACETAMINOPHEN 5-325 MG PO TABS: 1.0000 | ORAL_TABLET | Freq: Two times a day (BID) | ORAL | Status: DC | PRN

## 2011-08-11 NOTE — Telephone Encounter (Signed)
Done hardcopy to [facility]bin  

## 2011-08-12 NOTE — Telephone Encounter (Signed)
Faxed hardcopy to [address] [place] [phone]

## 2011-08-15 LAB — BASIC METABOLIC PANEL
CO2: 26
Calcium: 9.7
Creatinine, Ser: 1.02
GFR calc Af Amer: >60
GFR calc non Af Amer: 52 — ABNORMAL LOW

## 2011-08-15 LAB — DIFFERENTIAL
Basophils Absolute: 0
Basophils Relative: 0
Eosinophils Absolute: 0.2
Monocytes Relative: 11
Neutrophils Relative %: 74

## 2011-08-15 LAB — SEDIMENTATION RATE: Sed Rate: 78 — ABNORMAL HIGH

## 2011-08-15 LAB — CBC
MCHC: 33.5
MCV: 96
Platelets: 274
RDW: 15 — ABNORMAL HIGH

## 2011-08-18 ENCOUNTER — Emergency Department (HOSPITAL_COMMUNITY)
Admission: EM | Admit: 2011-08-18 | Discharge: 2011-08-19 | Disposition: A | Discharge status: Home / Self Care | Payer: Medicare Other | Attending: Emergency Medicine | Admitting: Emergency Medicine

## 2011-08-18 DIAGNOSIS — E119 Type 2 diabetes mellitus without complications: Secondary | ICD-10-CM | POA: Insufficient documentation

## 2011-08-18 DIAGNOSIS — R21 Rash and other nonspecific skin eruption: Secondary | ICD-10-CM | POA: Insufficient documentation

## 2011-08-18 DIAGNOSIS — I10 Essential (primary) hypertension: Secondary | ICD-10-CM | POA: Insufficient documentation

## 2011-10-06 ENCOUNTER — Other Ambulatory Visit: Payer: Self-pay

## 2011-10-06 MED ORDER — OMEPRAZOLE 20 MG PO CPDR: 20.0000 mg | DELAYED_RELEASE_CAPSULE | Freq: Every day | ORAL | Status: DC

## 2011-10-12 ENCOUNTER — Other Ambulatory Visit (INDEPENDENT_AMBULATORY_CARE_PROVIDER_SITE_OTHER): Payer: Medicare Other

## 2011-10-12 DIAGNOSIS — M109 Gout, unspecified: Secondary | ICD-10-CM

## 2011-10-12 DIAGNOSIS — Z0000 Encounter for general adult medical examination without abnormal findings: Secondary | ICD-10-CM

## 2011-10-12 DIAGNOSIS — Z79899 Other long term (current) drug therapy: Secondary | ICD-10-CM

## 2011-10-12 DIAGNOSIS — E119 Type 2 diabetes mellitus without complications: Secondary | ICD-10-CM

## 2011-10-12 LAB — TSH: TSH: 0.77 u[IU]/mL (ref 0.35–5.50)

## 2011-10-12 LAB — URINALYSIS, ROUTINE W REFLEX MICROSCOPIC
Hgb urine dipstick: NEGATIVE
Nitrite: NEGATIVE

## 2011-10-12 LAB — HEMOGLOBIN A1C: Hgb A1c MFr Bld: 5.6 % (ref 4.6–6.5)

## 2011-10-12 LAB — LIPID PANEL
Cholesterol: 126 mg/dL (ref 0–200)
LDL Cholesterol: 51 mg/dL (ref 0–99)
Total CHOL/HDL Ratio: 2

## 2011-10-12 LAB — CBC WITH DIFFERENTIAL/PLATELET
Eosinophils Absolute: 0.2 10*3/uL (ref 0.0–0.7)
MCHC: 32.7 g/dL (ref 30.0–36.0)
MCV: 104.7 fl — ABNORMAL HIGH (ref 78.0–100.0)
Monocytes Absolute: 1 10*3/uL (ref 0.1–1.0)
Neutrophils Relative %: 60.1 % (ref 43.0–77.0)
Platelets: 173 10*3/uL (ref 150.0–400.0)

## 2011-10-12 LAB — HEPATIC FUNCTION PANEL
ALT: 16 U/L (ref 0–35)
AST: 32 U/L (ref 0–37)
Bilirubin, Direct: 0.1 mg/dL (ref 0.0–0.3)
Total Bilirubin: 0.5 mg/dL (ref 0.3–1.2)

## 2011-10-12 LAB — BASIC METABOLIC PANEL
BUN: 17 mg/dL (ref 6–23)
Chloride: 109 mEq/L (ref 96–112)
Potassium: 5 mEq/L (ref 3.5–5.1)

## 2011-10-12 LAB — URIC ACID: Uric Acid, Serum: 3.3 mg/dL (ref 2.4–7.0)

## 2011-10-12 LAB — MICROALBUMIN / CREATININE URINE RATIO: Microalb, Ur: 1.6 mg/dL (ref 0.0–1.9)

## 2011-10-19 ENCOUNTER — Encounter: Payer: Self-pay | Admitting: Internal Medicine

## 2011-10-19 ENCOUNTER — Ambulatory Visit (INDEPENDENT_AMBULATORY_CARE_PROVIDER_SITE_OTHER): Payer: Medicare Other | Admitting: Internal Medicine

## 2011-10-19 VITALS — BP 124/70 | HR 55 | Temp 98.6°F | Ht 66.0 in | Wt 179.0 lb

## 2011-10-19 DIAGNOSIS — M179 Osteoarthritis of knee, unspecified: Secondary | ICD-10-CM | POA: Insufficient documentation

## 2011-10-19 DIAGNOSIS — IMO0002 Reserved for concepts with insufficient information to code with codable children: Secondary | ICD-10-CM

## 2011-10-19 DIAGNOSIS — E119 Type 2 diabetes mellitus without complications: Secondary | ICD-10-CM

## 2011-10-19 DIAGNOSIS — M1710 Unilateral primary osteoarthritis, unspecified knee: Secondary | ICD-10-CM

## 2011-10-19 DIAGNOSIS — Z23 Encounter for immunization: Secondary | ICD-10-CM

## 2011-10-19 DIAGNOSIS — Z0000 Encounter for general adult medical examination without abnormal findings: Secondary | ICD-10-CM

## 2011-10-19 HISTORY — DX: Unilateral primary osteoarthritis, unspecified knee: M17.10

## 2011-10-19 HISTORY — DX: Osteoarthritis of knee, unspecified: M17.9

## 2011-10-19 NOTE — Assessment & Plan Note (Addendum)
stable overall by hx and exam, most recent data reviewed with pt, and pt to continue medical treatment as before Lab Results  Component Value Date   HGBA1C 5.6 10/12/2011   Ok to stop the glucotrol at this time

## 2011-10-19 NOTE — Progress Notes (Signed)
Subjective:    Patient ID: Shari Mcmahon, female    DOB: May 24, 1927, 77 y.o.   MRN: [id number]  HPI  Here for wellness and f/u;  Overall doing ok;  Pt denies CP, worsening SOB, DOE, wheezing, orthopnea, PND, worsening LE edema, palpitations, dizziness or syncope.  Pt denies neurological change such as new Headache, facial or extremity weakness.  Pt denies polydipsia, polyuria, or low sugar symptoms. Pt states overall good compliance with treatment and medications, good tolerability, and trying to follow lower cholesterol diet.  Pt denies worsening depressive symptoms, suicidal ideation or panic. No fever, wt loss, night sweats, loss of appetite, or other constitutional symptoms.  Pt states good ability with ADL's, low fall risk, home safety reviewed and adequate, no significant changes in hearing or vision, and occasionally active with exercise.  Finds her appetitie has been some less recently, but walks with cane 6 laps around the building [facility].  Knee pain overall stable, actually some improved she thinks with more activity, and has been seeing ortho who does not recommend knee replacements due to age, and voltaren gel seems to help quite a bit.  Bp and HR stabl per pt at the center, and cbg's qod in low 100's.  Did have slight elevation after cortisone to knee recently, but o/ stable.  Did have hives and seen in ER recent now resolved. With improved knee pain she still struggles but not nearly as much with standing up from chair, then walks with cane. Past Medical History  Diagnosis Date  . ABDOMINAL PAIN, CHRONIC 04/07/2008  . Abdominal pain, generalized 12/24/2009  . ARTHRITIS 03/03/2008  . BRADYCARDIA, CHRONIC 12/17/2008  . CARDIAC MURMUR 02/01/2010  . CONSTIPATION, CHRONIC 10/15/2010  . DEGENERATIVE JOINT DISEASE, KNEES, BILATERAL 12/07/2007  . DEPRESSION 06/17/2009  . DIABETES MELLITUS, TYPE II 09/19/2007  . DYSPHAGIA UNSPECIFIED 12/24/2009  . FATIGUE 01/27/2010  . FEVER  UNSPECIFIED 09/30/2008  . Gastroparesis 12/24/2009  . GENERALIZED OSTEOARTHROSIS INVOLVING HAND 10/03/2007  . GLAUCOMA 09/19/2007  . GOUTY ARTHROPATHY UNSPECIFIED 02/17/2009  . GOUT 09/19/2007  . HYPERLIPIDEMIA 01/27/2010  . HYPERTENSION 09/19/2007  . LUNG NODULE 03/03/2008  . MENOPAUSAL DISORDER 12/17/2008  . PERIPHERAL NEUROPATHY 06/19/2008  . PERSONAL HISTORY MALIGNANT NEOPLASM STOMACH 09/19/2007  . 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 STOMACH CANCER 03/03/2008  . UTI 12/15/2009   Past Surgical History  Procedure Date  . Vagotomy   . Partial gastrectomy   . Billroth ii gastorenterostomy     reports that she has quit smoking. She does not have any smokeless tobacco history on file. She reports that she does not drink alcohol or use illicit drugs. family history includes Cancer in her maternal grandmother, mother, paternal grandfather, and sister; Diabetes in her other; and Stroke in her father. Allergies  Allergen Reactions  . Bisacodyl   . Hydrocodone-Acetaminophen     REACTION: Rash  . Penicillins   . Timolol     REACTION: bradycardia worse   Current Outpatient Prescriptions on File Prior to Visit  Medication Sig Dispense Refill  . allopurinol (ZYLOPRIM) 100 MG tablet Take 1 tablet (100 mg total) by mouth daily.  30 tablet  7  . amLODipine (NORVASC) 5 MG tablet Take 1 tablet (5 mg total) by mouth daily.  30 tablet  11  . glipiZIDE (GLUCOTROL) 2.5 MG 24 hr tablet 1 tab by mouth per day as needed  30 tablet  11  . HYDROcodone-acetaminophen (NORCO) 5-325 MG per tablet Take 1  tablet by mouth 2 (two) times daily as needed.  60 tablet  2  . latanoprost (XALATAN) 0.005 % ophthalmic solution Place 1 drop into both eyes daily.        Marland Kitchen losartan (COZAAR) 50 MG tablet Take 1 tablet (50 mg total) by mouth daily.  30 tablet  11  . omeprazole (PRILOSEC) 20 MG capsule Take 1 capsule (20 mg total) by mouth daily.  90 capsule  3  . pravastatin (PRAVACHOL) 10 MG tablet Take 1 tablet  (10 mg total) by mouth every evening.  90 tablet  3  . DULoxetine (CYMBALTA) 60 MG capsule Take 60 mg by mouth daily.         Review of Systems Review of Systems  Constitutional: Negative for diaphoresis, activity change, appetite change and unexpected weight change.  HENT: Negative for hearing loss, ear pain, facial swelling, mouth sores and neck stiffness.   Eyes: Negative for pain, redness and visual disturbance.  Respiratory: Negative for shortness of breath and wheezing.   Cardiovascular: Negative for chest pain and palpitations.  Gastrointestinal: Negative for diarrhea, blood in stool, abdominal distention and rectal pain.  Genitourinary: Negative for hematuria, flank pain and decreased urine volume.  Musculoskeletal: Negative for myalgias and joint swelling.  Skin: Negative for color change and wound.  Neurological: Negative for syncope and numbness.  Hematological: Negative for adenopathy.  Psychiatric/Behavioral: Negative for hallucinations, self-injury, decreased concentration and agitation.      Objective:   Physical Exam BP 124/70  Pulse 55  Temp(Src) 98.6 F (37 C) (Oral)  Ht 5\' 6"  (1.676 m)  Wt 179 lb 0.6 oz (81.212 kg)  BMI 28.90 kg/m2  SpO2 97% Physical Exam  VS noted Constitutional: Pt is oriented to person, place, and time. Appears well-developed and well-nourished.  HENT:  Head: Normocephalic and atraumatic.  Right Ear: External ear normal.  Left Ear: External ear normal.  Nose: Nose normal.  Mouth/Throat: Oropharynx is clear and moist.  Eyes: Conjunctivae and EOM are normal. Pupils are equal, round, and reactive to light.  Neck: Normal range of motion. Neck supple. No JVD present. No tracheal deviation present.  Cardiovascular: Normal rate, regular rhythm, normal heart sounds and intact distal pulses.   Pulmonary/Chest: Effort normal and breath sounds normal.  Abdominal: Soft. Bowel sounds are normal. There is no tenderness.  Musculoskeletal: Normal range  of motion. Exhibits no edema.  Lymphadenopathy:  Has no cervical adenopathy.  Neurological: Pt is alert and oriented to person, place, and time. Pt has normal reflexes. No cranial nerve deficit.  Skin: Skin is warm and dry. No rash noted.  Psychiatric:  Has  normal mood and affect. Behavior is normal.     Assessment & Plan:

## 2011-10-19 NOTE — Assessment & Plan Note (Signed)
Ongoing , improved symtpomatically, The current medical regimen is effective;  continue present plan and medications., f/u ortho as planned

## 2011-10-19 NOTE — Patient Instructions (Signed)
OK to stop the glucotrol 2.5 mg  Continue all other medications as before Please return in 6 mo with Lab testing done 3-5 days before

## 2011-10-19 NOTE — Assessment & Plan Note (Signed)
Overall doing well, age appropriate education and counseling updated, referrals for preventative services and immunizations addressed, dietary and smoking counseling addressed, most recent labs and ECG reviewed.  I have personally reviewed and have noted: 1) the patient's medical and social history 2) The pt's use of alcohol, tobacco, and illicit drugs 3) The patient's current medications and supplements 4) Functional ability including ADL's, fall risk, home safety risk, hearing and visual impairment 5) Diet and physical activities 6) Evidence for depression or mood disorder 7) The patient's height, weight, and BMI have been recorded in the chart I have made referrals, and provided counseling and education based on review of the above Otherwise up to date today

## 2011-11-11 ENCOUNTER — Other Ambulatory Visit: Payer: Self-pay

## 2011-11-11 MED ORDER — HYDROCODONE-ACETAMINOPHEN 5-325 MG PO TABS: 1.0000 | ORAL_TABLET | Freq: Two times a day (BID) | ORAL | Status: DC | PRN

## 2011-11-11 NOTE — Telephone Encounter (Signed)
Done hardcopy to [facility]bin  

## 2011-11-11 NOTE — Telephone Encounter (Signed)
Faxed hardcopy to pharmacy. 

## 2011-12-09 ENCOUNTER — Other Ambulatory Visit: Payer: Self-pay

## 2011-12-09 MED ORDER — ALLOPURINOL 100 MG PO TABS: 100.0000 mg | ORAL_TABLET | Freq: Every day | ORAL | Status: DC

## 2011-12-14 ENCOUNTER — Telehealth: Payer: Self-pay

## 2011-12-14 NOTE — Telephone Encounter (Signed)
Faxed [facility] form and office notes to [phone]

## 2012-02-08 ENCOUNTER — Other Ambulatory Visit: Payer: Self-pay

## 2012-02-08 MED ORDER — HYDROCODONE-ACETAMINOPHEN 5-325 MG PO TABS: 1.0000 | ORAL_TABLET | Freq: Two times a day (BID) | ORAL | Status: DC | PRN

## 2012-02-08 NOTE — Telephone Encounter (Signed)
Done hardcopy to [facility]bin  

## 2012-02-08 NOTE — Telephone Encounter (Signed)
Faxed hardcopy to pharmacy. 

## 2012-03-08 ENCOUNTER — Telehealth: Payer: Self-pay

## 2012-03-08 MED ORDER — PRAVASTATIN SODIUM 10 MG PO TABS: 10.0000 mg | ORAL_TABLET | Freq: Every evening | ORAL | Status: DC

## 2012-03-08 NOTE — Telephone Encounter (Signed)
Prescription was refilled

## 2012-04-18 ENCOUNTER — Ambulatory Visit: Payer: Medicare Other | Admitting: Internal Medicine

## 2012-05-09 ENCOUNTER — Other Ambulatory Visit: Payer: Self-pay

## 2012-05-09 MED ORDER — LOSARTAN POTASSIUM 50 MG PO TABS: 50.0000 mg | ORAL_TABLET | Freq: Every day | ORAL | Status: DC

## 2012-05-14 ENCOUNTER — Other Ambulatory Visit (INDEPENDENT_AMBULATORY_CARE_PROVIDER_SITE_OTHER): Payer: Medicare Other

## 2012-05-14 DIAGNOSIS — E119 Type 2 diabetes mellitus without complications: Secondary | ICD-10-CM

## 2012-05-14 LAB — BASIC METABOLIC PANEL
CO2: 28 mEq/L (ref 19–32)
Chloride: 110 mEq/L (ref 96–112)
Creatinine, Ser: 1 mg/dL (ref 0.4–1.2)
Potassium: 4.6 mEq/L (ref 3.5–5.1)

## 2012-05-14 LAB — LIPID PANEL
Cholesterol: 156 mg/dL (ref 0–200)
Total CHOL/HDL Ratio: 2
Triglycerides: 60 mg/dL (ref 0.0–149.0)

## 2012-05-16 ENCOUNTER — Encounter: Payer: Self-pay | Admitting: Internal Medicine

## 2012-05-16 ENCOUNTER — Ambulatory Visit (INDEPENDENT_AMBULATORY_CARE_PROVIDER_SITE_OTHER): Payer: Medicare Other | Admitting: Internal Medicine

## 2012-05-16 ENCOUNTER — Other Ambulatory Visit: Payer: Self-pay

## 2012-05-16 VITALS — BP 142/78 | HR 52 | Temp 97.0°F | Ht 66.0 in | Wt 177.1 lb

## 2012-05-16 DIAGNOSIS — I10 Essential (primary) hypertension: Secondary | ICD-10-CM

## 2012-05-16 DIAGNOSIS — E785 Hyperlipidemia, unspecified: Secondary | ICD-10-CM

## 2012-05-16 DIAGNOSIS — E119 Type 2 diabetes mellitus without complications: Secondary | ICD-10-CM

## 2012-05-16 DIAGNOSIS — M109 Gout, unspecified: Secondary | ICD-10-CM

## 2012-05-16 DIAGNOSIS — Z0000 Encounter for general adult medical examination without abnormal findings: Secondary | ICD-10-CM

## 2012-05-16 MED ORDER — LOSARTAN POTASSIUM 100 MG PO TABS: 100.0000 mg | ORAL_TABLET | Freq: Every day | ORAL | Status: DC

## 2012-05-16 MED ORDER — AMLODIPINE BESYLATE 5 MG PO TABS: 5.0000 mg | ORAL_TABLET | Freq: Every day | ORAL | Status: DC

## 2012-05-16 NOTE — Patient Instructions (Addendum)
Increase the losartan to 100 mg per day Continue all other medications as before Please continue your efforts at being more active, low cholesterol diet, and weight control. Please continue to monitor your blood pressure as you do; your goal is to be less than 140/90 Please return in 6 mo with Lab testing done 3-5 days before

## 2012-05-16 NOTE — Progress Notes (Signed)
Subjective:    Patient ID: Shari Mcmahon, female    DOB: 1927-07-12, 78 y.o.   MRN: [id number]  HPI  Here to f/u; overall doing ok,  Pt denies chest pain, increased sob or doe, wheezing, orthopnea, PND, increased LE swelling, palpitations, dizziness or syncope.  Pt denies new neurological symptoms such as new headache, or facial or extremity weakness or numbness   Pt denies polydipsia, polyuria, or low sugar symptoms such as weakness or confusion improved with po intake.  Pt states overall good compliance with meds, trying to follow lower cholesterol, diabetic diet, wt overall stable but little exercise however.  Did have cortisione to knees recently with slight elevation of sugars to 138.  Also with recent gout - tx with celebrex 100 qam and  Colchicine with good results  - no further right first MTP red/pain/swelling, [facility] around [facility]ldg every day with cane.  Nurse there checks BP regularly, and has been elev with SBP > 140.  Past Medical History  Diagnosis Date  . ABDOMINAL PAIN, CHRONIC 04/07/2008  . Abdominal pain, generalized 12/24/2009  . ARTHRITIS 03/03/2008  . BRADYCARDIA, CHRONIC 12/17/2008  . CARDIAC MURMUR 02/01/2010  . CONSTIPATION, CHRONIC 10/15/2010  . DEGENERATIVE JOINT DISEASE, KNEES, BILATERAL 12/07/2007  . DEPRESSION 06/17/2009  . DIABETES MELLITUS, TYPE II 09/19/2007  . DYSPHAGIA UNSPECIFIED 12/24/2009  . FATIGUE 01/27/2010  . FEVER UNSPECIFIED 09/30/2008  . Gastroparesis 12/24/2009  . GENERALIZED OSTEOARTHROSIS INVOLVING HAND 10/03/2007  . GLAUCOMA 09/19/2007  . GOUTY ARTHROPATHY UNSPECIFIED 02/17/2009  . GOUT 09/19/2007  . HYPERLIPIDEMIA 01/27/2010  . HYPERTENSION 09/19/2007  . LUNG NODULE 03/03/2008  . MENOPAUSAL DISORDER 12/17/2008  . PERIPHERAL NEUROPATHY 06/19/2008  . PERSONAL HISTORY MALIGNANT NEOPLASM STOMACH 09/19/2007  . 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 STOMACH CANCER 03/03/2008  . UTI 12/15/2009  .  Arthritis of knee, degenerative 10/19/2011   Past Surgical History  Procedure Date  . Vagotomy   . Partial gastrectomy   . Billroth ii gastorenterostomy     reports that she has quit smoking. She does not have any smokeless tobacco history on file. She reports that she does not drink alcohol or use illicit drugs. family history includes Cancer in her maternal grandmother, mother, paternal grandfather, and sister; Diabetes in her other; and Stroke in her father. Allergies  Allergen Reactions  . Bisacodyl   . Hydrocodone-Acetaminophen     REACTION: Rash  . Penicillins   . Timolol     REACTION: bradycardia worse   Review of Systems Review of Systems  Constitutional: Negative for diaphoresis and unexpected weight change.  HENT: Negative for tinnitus.   Eyes: Negative for photophobia and visual disturbance.  Respiratory: Negative for choking and stridor.   Gastrointestinal: Negative for vomiting and blood in stool.  Genitourinary: Negative for hematuria and decreased urine volume.  Musculoskeletal: Negative for gait problem.  Skin: Negative for color change and wound.  Neurological: Negative for tremors and numbness.     Objective:   Physical Exam BP 142/78  Pulse 52  Temp 97 F (36.1 C) (Oral)  Ht 5\' 6"  (1.676 m)  Wt 177 lb 2 oz (80.343 kg)  BMI 28.59 kg/m2  SpO2 96% Physical Exam  VS noted Constitutional: Pt appears well-developed and well-nourished.  HENT: Head: Normocephalic.  Right Ear: External ear normal.  Left Ear: External ear normal.  Eyes: Conjunctivae and EOM are normal. Pupils are equal, round, and reactive to light.  Neck: Normal range of motion. Neck supple.  Cardiovascular: Normal rate and regular rhythm.   Pulmonary/Chest: Effort normal and breath sounds normal.  Neurological: Pt is alert..  Skin: Skin is warm. No erythema.  Psychiatric: Pt behavior is normal. 1+ nervous.  No joint swelling    Assessment & Plan:

## 2012-05-20 ENCOUNTER — Encounter: Payer: Self-pay | Admitting: Internal Medicine

## 2012-05-20 NOTE — Assessment & Plan Note (Signed)
stable overall by hx and exam, most recent data reviewed with pt, and pt to continue medical treatment as before Lab Results  Component Value Date   HGBA1C 6.0 05/14/2012

## 2012-05-20 NOTE — Assessment & Plan Note (Addendum)
Mild increased, o/w stable overall by hx and exam, most recent data reviewed with pt, and pt to increase the losartan to 100 qd BP Readings from Last 3 Encounters:  05/16/12 142/78  10/19/11 124/70  04/19/11 110/60

## 2012-05-20 NOTE — Assessment & Plan Note (Signed)
Improved, for f/u uric acid next draw,  to f/u any worsening symptoms or concerns

## 2012-05-20 NOTE — Assessment & Plan Note (Signed)
stable overall by hx and exam, most recent data reviewed with pt, and pt to continue medical treatment as before Lab Results  Component Value Date   LDLCALC 70 05/14/2012

## 2012-06-14 ENCOUNTER — Other Ambulatory Visit: Payer: Self-pay

## 2012-06-14 MED ORDER — HYDROCODONE-ACETAMINOPHEN 5-325 MG PO TABS: 1.0000 | ORAL_TABLET | Freq: Two times a day (BID) | ORAL | Status: DC | PRN

## 2012-06-14 NOTE — Telephone Encounter (Signed)
Done hardcopy to [facility]bin  

## 2012-06-14 NOTE — Telephone Encounter (Signed)
Faxed hardcopy to pharmacy. 

## 2012-09-11 ENCOUNTER — Other Ambulatory Visit: Payer: Self-pay | Admitting: Internal Medicine

## 2012-09-11 MED ORDER — GLIPIZIDE ER 2.5 MG PO TB24: 2.5000 mg | ORAL_TABLET | Freq: Every day | ORAL | Status: DC

## 2012-09-11 NOTE — Telephone Encounter (Signed)
Pt called stating she received a steroid injection in her knees form Orthopaedic doctor and is requesting refill of Glipizide to control blood sugars.

## 2012-09-11 NOTE — Addendum Note (Signed)
Addended by: Scharlene Gloss B on: 09/11/2012 01:41 PM   Modules accepted: Orders

## 2012-11-09 ENCOUNTER — Telehealth: Payer: Self-pay | Admitting: *Deleted

## 2012-11-09 NOTE — Telephone Encounter (Signed)
Needs appt with regina or me on Monday, or consider UC or ER before if pain worsens, has fever, vomiting, diarrhea of blood, or not able to take po, or weakness and falls

## 2012-11-09 NOTE — Telephone Encounter (Signed)
NP with [facility] was in home for evaluation with pt today and pt is c/o abdominal pain x 2 weeks. NP called to inform MD that pt may need to be evaluated (Pt has appointment scheduled 11/21/2012 at10:00am).

## 2012-11-09 NOTE — Telephone Encounter (Signed)
Patient informed. 

## 2012-11-14 ENCOUNTER — Other Ambulatory Visit (INDEPENDENT_AMBULATORY_CARE_PROVIDER_SITE_OTHER): Payer: Medicare Other

## 2012-11-14 DIAGNOSIS — Z0000 Encounter for general adult medical examination without abnormal findings: Secondary | ICD-10-CM

## 2012-11-14 DIAGNOSIS — E119 Type 2 diabetes mellitus without complications: Secondary | ICD-10-CM

## 2012-11-14 DIAGNOSIS — M109 Gout, unspecified: Secondary | ICD-10-CM

## 2012-11-14 LAB — URINALYSIS, ROUTINE W REFLEX MICROSCOPIC
Bilirubin Urine: NEGATIVE
Hgb urine dipstick: NEGATIVE
Nitrite: NEGATIVE
Urobilinogen, UA: 0.2 (ref 0.0–1.0)

## 2012-11-14 LAB — CBC WITH DIFFERENTIAL/PLATELET
Basophils Relative: 0.6 % (ref 0.0–3.0)
Eosinophils Relative: 1.4 % (ref 0.0–5.0)
Lymphocytes Relative: 24.3 % (ref 12.0–46.0)
Neutrophils Relative %: 63.9 % (ref 43.0–77.0)
Platelets: 181 10*3/uL (ref 150.0–400.0)
RBC: 3.48 Mil/uL — ABNORMAL LOW (ref 3.87–5.11)
WBC: 6.3 10*3/uL (ref 4.5–10.5)

## 2012-11-14 LAB — MICROALBUMIN / CREATININE URINE RATIO
Creatinine,U: 219.1 mg/dL
Microalb Creat Ratio: 0.4 mg/g (ref 0.0–30.0)
Microalb, Ur: 0.8 mg/dL (ref 0.0–1.9)

## 2012-11-14 LAB — BASIC METABOLIC PANEL
BUN: 16 mg/dL (ref 6–23)
CO2: 25 mEq/L (ref 19–32)
Chloride: 109 mEq/L (ref 96–112)
Creatinine, Ser: 0.9 mg/dL (ref 0.4–1.2)
Glucose, Bld: 81 mg/dL (ref 70–99)

## 2012-11-14 LAB — LIPID PANEL
Cholesterol: 113 mg/dL (ref 0–200)
LDL Cholesterol: 45 mg/dL (ref 0–99)
Triglycerides: 51 mg/dL (ref 0.0–149.0)

## 2012-11-14 LAB — HEPATIC FUNCTION PANEL
ALT: 16 U/L (ref 0–35)
AST: 27 U/L (ref 0–37)
Albumin: 3.2 g/dL — ABNORMAL LOW (ref 3.5–5.2)
Total Protein: 6.9 g/dL (ref 6.0–8.3)

## 2012-11-14 LAB — TSH: TSH: 0.78 u[IU]/mL (ref 0.35–5.50)

## 2012-11-21 ENCOUNTER — Ambulatory Visit (INDEPENDENT_AMBULATORY_CARE_PROVIDER_SITE_OTHER): Payer: Medicare Other | Admitting: Internal Medicine

## 2012-11-21 ENCOUNTER — Encounter: Payer: Self-pay | Admitting: Internal Medicine

## 2012-11-21 VITALS — BP 140/60 | HR 60 | Temp 97.0°F | Ht 66.0 in | Wt 179.2 lb

## 2012-11-21 DIAGNOSIS — R1013 Epigastric pain: Secondary | ICD-10-CM | POA: Insufficient documentation

## 2012-11-21 DIAGNOSIS — Z0000 Encounter for general adult medical examination without abnormal findings: Secondary | ICD-10-CM

## 2012-11-21 NOTE — Patient Instructions (Addendum)
Please continue all other medications as before Please have the pharmacy call with any other refills you may need. You will be contacted regarding the referral for: GI - with Dr Jarold Motto Your blood work was OK today Please keep your appointments with your specialists as you have planned   - orthopedic, and physical therapy Please continue your efforts at being more active, low cholesterol diet, and weight control. You are otherwise up to date with prevention measures today. Please remember to sign up for My Chart if you have not done so, as this will be important to you in the future with finding out test results, communicating by private email, and scheduling acute appointments online when needed. Please return in 6 months, or sooner if needed

## 2012-11-21 NOTE — Progress Notes (Signed)
Subjective:    Patient ID: Shari Mcmahon, female    DOB: [birth year], 79 y.o.   MRN: [id number]  HPI  Here for wellness and f/u;  Overall doing ok;  Pt denies CP, worsening SOB, DOE, wheezing, orthopnea, PND, worsening LE edema, palpitations, dizziness or syncope.  Pt denies neurological change such as new headache, facial or extremity weakness.  Pt denies polydipsia, polyuria, or low sugar symptoms. Pt states overall good compliance with treatment and medications, good tolerability, and has been trying to follow lower cholesterol diet.  Pt denies worsening depressive symptoms, suicidal ideation or panic. No fever, night sweats, wt loss, loss of appetite, or other constitutional symptoms.  Pt states good ability with ADL's, has low fall risk, home safety reviewed and adequate, no other significant changes in hearing or vision, and walks with cane 3-4 times around the building she lives at every day when not raining.  Only takes the glucotrol for elev sugars after cortisone shots.  Had a nurse come by to visit from [facility], pt mentioned having mid/upper abd pains, pt wants to see if needs to f/u with Dr Jarold Motto. Pt eats twice per day, for the past month has had discomfort very soon after swallowing to the epigastrium, with fullness but no n/v (but feels like she could vomit); denies dysphagia but later says "it just wont go down."  No liquids dysphagia but only can take 3-4 swallows at a time.  Wt ovreall ok per pt, no fever, no diarrhea but has had mild intermittent constipation but usually controlled with 3-4 swallows MOM.  Taking now celebrex 200 mg qod prn since her gout flare a few months ago, per ortho.  Has particular problem with bilat chronic knee pain, and standing from sitting.  For PT per ortho soon. Past Medical History  Diagnosis Date  . ABDOMINAL PAIN, CHRONIC 04/07/2008  . Abdominal pain, generalized 12/24/2009  . ARTHRITIS 03/03/2008  . BRADYCARDIA, CHRONIC 12/17/2008  . CARDIAC MURMUR 02/01/2010    . CONSTIPATION, CHRONIC 10/15/2010  . DEGENERATIVE JOINT DISEASE, KNEES, BILATERAL 12/07/2007  . DEPRESSION 06/17/2009  . DIABETES MELLITUS, TYPE II 09/19/2007  . DYSPHAGIA UNSPECIFIED 12/24/2009  . FATIGUE 01/27/2010  . FEVER UNSPECIFIED 09/30/2008  . Gastroparesis 12/24/2009  . GENERALIZED OSTEOARTHROSIS INVOLVING HAND 10/03/2007  . GLAUCOMA 09/19/2007  . GOUTY ARTHROPATHY UNSPECIFIED 02/17/2009  . GOUT 09/19/2007  . HYPERLIPIDEMIA 01/27/2010  . HYPERTENSION 09/19/2007  . LUNG NODULE 03/03/2008  . MENOPAUSAL DISORDER 12/17/2008  . PERIPHERAL NEUROPATHY 06/19/2008  . PERSONAL HISTORY MALIGNANT NEOPLASM STOMACH 09/19/2007  . 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 STOMACH CANCER 03/03/2008  . UTI 12/15/2009  . Arthritis of knee, degenerative 10/19/2011   Past Surgical History  Procedure Date  . Vagotomy   . Partial gastrectomy   . Billroth ii gastorenterostomy     reports that she has quit smoking. She does not have any smokeless tobacco history on file. She reports that she does not drink alcohol or use illicit drugs. family history includes Cancer in her maternal grandmother, mother, paternal grandfather, and sister; Diabetes in her other; and Stroke in her father. Allergies  Allergen Reactions  . Bisacodyl   . Hydrocodone-Acetaminophen     REACTION: Rash  . Penicillins   . Timolol     REACTION: bradycardia worse   Current Outpatient Prescriptions on File Prior to Visit  Medication Sig Dispense Refill  . allopurinol (ZYLOPRIM) 100 MG tablet Take 1 tablet (100 mg total) by mouth daily.  30 tablet  11  . amLODipine (NORVASC) 5 MG tablet Take 1 tablet (5 mg total) by mouth daily.  30 tablet  11  . DULoxetine (CYMBALTA) 60 MG capsule Take 60 mg by mouth daily.        Marland Kitchen glipiZIDE (GLIPIZIDE XL) 2.5 MG 24 hr tablet Take 1 tablet (2.5 mg total) by mouth daily.  30 tablet  1  . HYDROcodone-acetaminophen (NORCO/VICODIN) 5-325 MG per tablet Take 1 tablet by mouth 2 (two) times  daily as needed.  60 tablet  2  . latanoprost (XALATAN) 0.005 % ophthalmic solution Place 1 drop into both eyes daily.        Marland Kitchen losartan (COZAAR) 100 MG tablet Take 1 tablet (100 mg total) by mouth daily.  90 tablet  3  . omeprazole (PRILOSEC) 20 MG capsule Take 1 capsule (20 mg total) by mouth daily.  90 capsule  3  . pravastatin (PRAVACHOL) 10 MG tablet Take 1 tablet (10 mg total) by mouth every evening.  90 tablet  2   Review of Systems Constitutional: Negative for diaphoresis, activity change, appetite change or unexpected weight change.  HENT: Negative for hearing loss, ear pain, facial swelling, mouth sores and neck stiffness.   Eyes: Negative for pain, redness and visual disturbance.  Respiratory: Negative for shortness of breath and wheezing.   Cardiovascular: Negative for chest pain and palpitations.  Gastrointestinal: Negative for diarrhea, blood in stool Genitourinary: Negative for hematuria, flank pain or change in urine volume.  Musculoskeletal: Negative for myalgias and joint swelling.  Skin: Negative for color change and wound.  Neurological: Negative for syncope and numbness. other than noted Hematological: Negative for adenopathy.  Psychiatric/Behavioral: Negative for hallucinations, self-injury, decreased concentration and agitation.      Objective:   Physical Exam BP 140/60  Pulse 60  Temp 97 F (36.1 C) (Oral)  Ht 5\' 6"  (1.676 m)  Wt 179 lb 4 oz (81.307 kg)  BMI 28.93 kg/m2  SpO2 94% VS noted,  Constitutional: Pt is oriented to person, place, and time. Appears well-developed and well-nourished.  Head: Normocephalic and atraumatic.  Right Ear: External ear normal.  Left Ear: External ear normal.  Nose: Nose normal.  Mouth/Throat: Oropharynx is clear and moist.  Eyes: Conjunctivae and EOM are normal. Pupils are equal, round, and reactive to light.  Neck: Normal range of motion. Neck supple. No JVD present. No tracheal deviation present.  Cardiovascular:  Normal rate, regular rhythm, normal heart sounds and intact distal pulses.   Pulmonary/Chest: Effort normal and breath sounds normal.  Abdominal: Soft. Bowel sounds are normal. There is tenderness to mod palpation epigastrium, No HSM  Musculoskeletal: Normal range of motion. Exhibits no edema.  Lymphadenopathy:  Has no cervical adenopathy.  Neurological: Pt is alert and oriented to person, place, and time. Pt has normal reflexes. No cranial nerve deficit. Motor/dtr intact Bilat knees with crepitus, no effusion, but very difficult to get up on exam table Skin: Skin is warm and dry. No rash noted.  Psychiatric:  Has  normal mood and affect. Behavior is normal.     Assessment & Plan:

## 2012-11-21 NOTE — Assessment & Plan Note (Addendum)
With hx of partial gastrectomy, hx of gastric ca, will need f/u with GI  - refer Dr Jarold Motto, ? Need repeat egd, and cont PPI

## 2012-11-21 NOTE — Assessment & Plan Note (Signed)

## 2012-12-04 ENCOUNTER — Encounter: Payer: Self-pay | Admitting: Gastroenterology

## 2012-12-04 ENCOUNTER — Ambulatory Visit (INDEPENDENT_AMBULATORY_CARE_PROVIDER_SITE_OTHER): Payer: Medicare Other | Admitting: Gastroenterology

## 2012-12-04 VITALS — BP 124/50 | HR 66 | Ht 66.0 in | Wt 178.1 lb

## 2012-12-04 DIAGNOSIS — K911 Postgastric surgery syndromes: Secondary | ICD-10-CM

## 2012-12-04 DIAGNOSIS — G8929 Other chronic pain: Secondary | ICD-10-CM

## 2012-12-04 DIAGNOSIS — R1013 Epigastric pain: Secondary | ICD-10-CM

## 2012-12-04 DIAGNOSIS — Z9889 Other specified postprocedural states: Secondary | ICD-10-CM

## 2012-12-04 DIAGNOSIS — K3184 Gastroparesis: Secondary | ICD-10-CM

## 2012-12-04 DIAGNOSIS — Z85028 Personal history of other malignant neoplasm of stomach: Secondary | ICD-10-CM

## 2012-12-04 NOTE — Patient Instructions (Signed)
You have been scheduled for an endoscopy with propofol. Please follow written instructions given to you at your visit today. If you use inhalers (even only as needed) or a CPAP machine, please bring them with you on the day of your procedure. Please follow diabetic instructions on procedure instructions as well.

## 2012-12-04 NOTE — Progress Notes (Signed)
This is a elderly 79 year old [place] American female who had gastric adenocarcinoma in 2005 resected in [facility] [place].  I have an extensive evaluation her chart dated June of 2008.  Her surgeries included a vagotomy, partial gastrectomy, Billroth II gastroenterostomy.  She underwent postoperatively with radiation and chemotherapy.  She had repeat endoscopy 2 years ago which was essentially unremarkable.  She now presents with several months of early satiety and regurgitation but no true reflux or dysphagia.  She takes Prilosec 20 mg a day chronically, and is recently been on Celebrex 200 mg a day.  She denies hematemesis, melena, any specific hepatobiliary complaints.  CT scan in 2009 per oncology was unremarkable.  It is unclear to me if she has had a cholecystectomy or not.  Patient denies use of alcohol, cigarettes, or other NSAIDs.  Despite these complaints she's had no significant weight loss.  In the past her problems are been felt to possibly be from a gastroparesis associated with a vagotomy and gastric surgery.  In March of 2011 she was empirically dilated with [facility] dilator.  Evaluation for H. pylori was negative.  In the past she had improvement with Reglan but this had to be discontinued because of some tardive dyskinesia.  Gallbladder ultrasound was negative in 2011.  Current Medications, Allergies, Past Medical History, Past Surgical History, Family History and Social History were reviewed in [employer] record.  ROS: All systems were reviewed and are negative unless otherwise stated in the HPI.          Physical Exam: Blood pressure 124/50, pulse 66 and regular, and weight 178 pounds with BMI of 28.75.  I cannot appreciate stigmata of chronic liver disease.  Her chest is clear and she is in a regular rhythm without murmurs gallops or rubs.  Her abdomen shows no organomegaly, masses, tenderness, distention, and bowel sounds are normal without "a succussion  splash.  Mental status is normal.  She does not appear or dehydrated.    Assessment and Plan: Gastroparesis probably related to her previous vagotomy and partial gastric resection.  I've scheduled her for endoscopic exam ASAP.  Should this be unremarkable we will consider domperidone therapy with a gastroparesis diet.  Review of her labs shows a mild macrocytic anemia.  She probably needs to have a B12, and folate acid level drawn.  Also consider followup abdominal CT scan.  I cannot see where she's seen by oncology in several years for now we'll continue her medications as listed and reviewed.  Please copy her primary care doctor with this noted. No diagnosis found.

## 2012-12-04 NOTE — Addendum Note (Signed)
Addended by: Ok Anis A on: 12/04/2012 02:04 PM   Modules accepted: Orders

## 2012-12-05 ENCOUNTER — Other Ambulatory Visit (INDEPENDENT_AMBULATORY_CARE_PROVIDER_SITE_OTHER): Payer: Medicare Other

## 2012-12-05 ENCOUNTER — Encounter: Payer: Self-pay | Admitting: Gastroenterology

## 2012-12-05 ENCOUNTER — Other Ambulatory Visit: Payer: Self-pay | Admitting: *Deleted

## 2012-12-05 ENCOUNTER — Ambulatory Visit (AMBULATORY_SURGERY_CENTER): Payer: Medicare Other | Admitting: Gastroenterology

## 2012-12-05 VITALS — BP 155/63 | HR 56 | Temp 96.6°F | Resp 21 | Ht 66.0 in | Wt 178.0 lb

## 2012-12-05 DIAGNOSIS — Z85028 Personal history of other malignant neoplasm of stomach: Secondary | ICD-10-CM

## 2012-12-05 DIAGNOSIS — C169 Malignant neoplasm of stomach, unspecified: Secondary | ICD-10-CM

## 2012-12-05 DIAGNOSIS — K3184 Gastroparesis: Secondary | ICD-10-CM

## 2012-12-05 DIAGNOSIS — K911 Postgastric surgery syndromes: Secondary | ICD-10-CM

## 2012-12-05 DIAGNOSIS — K2950 Unspecified chronic gastritis without bleeding: Secondary | ICD-10-CM

## 2012-12-05 DIAGNOSIS — Z9889 Other specified postprocedural states: Secondary | ICD-10-CM

## 2012-12-05 DIAGNOSIS — K2990 Gastroduodenitis, unspecified, without bleeding: Secondary | ICD-10-CM

## 2012-12-05 DIAGNOSIS — R1013 Epigastric pain: Secondary | ICD-10-CM

## 2012-12-05 DIAGNOSIS — K2970 Gastritis, unspecified, without bleeding: Secondary | ICD-10-CM

## 2012-12-05 DIAGNOSIS — K2940 Chronic atrophic gastritis without bleeding: Secondary | ICD-10-CM

## 2012-12-05 MED ORDER — AMBULATORY NON FORMULARY MEDICATION: Status: DC

## 2012-12-05 MED ORDER — SODIUM CHLORIDE 0.9 % IV SOLN: 500.0000 mL | INTRAVENOUS | Status: DC

## 2012-12-05 NOTE — Op Note (Signed)
[facility] [address] [place], [address]   ENDOSCOPY PROCEDURE REPORT  PATIENT: Shari, Mcmahon  MR#: [id number] BIRTHDATE: 09-22-1927 , 86  yrs. old GENDER: Female ENDOSCOPIST:Lourine Alberico Hale Bogus, MD, [facility] REFERRED BY: PROCEDURE DATE:  12/05/2012 PROCEDURE:   EGD w/ biopsy ASA CLASS:    Class III INDICATIONS: previous partial gastrectomy for stomach cancer.  The patient now has delayed gastric emptying problems.Marland Kitchen MEDICATION: propofol (Diprivan) 150mg  IV TOPICAL ANESTHETIC:  DESCRIPTION OF PROCEDURE:   After the risks and benefits of the procedure were explained, informed consent was obtained.  The [facility] [id number]  endoscope was introduced through the mouth  and advanced to the second portion of the duodenum .  The instrument was slowly withdrawn as the mucosa was fully examined.    Scope easily passed through the esophagus into the stomach.  There is a partial gastrectomy with a Billroth II gastroenterostomy which is widely patent and not ulcerated.  Both limbs of the gastroenterostomy are patent and normal.  There is a fairly generous gastric remnant noted with atrophic gastritis and the remaining mucosa.  Multiple biopsies were obtained of the gastric mucosa as were pictures for documentation.  The endoscope was then withdrawn through the esophagus which appeared normal.  The patient tolerated this procedure well.   Retroflexed views revealed no abnormalities.    The scope was then withdrawn from the patient and the procedure completed.  COMPLICATIONS: There were no complications.   ENDOSCOPIC IMPRESSION: 1.   Scope easily passed through the esophagus into the stomach. There is a partial gastrectomy with a Billroth II gastroenterostomy which is widely patent and not ulcerated.  Both limbs of the gastroenterostomy are patent and normal.  There is a fairly generous gastric remnant noted with atrophic gastritis and the remaining mucosa.  Multiple  biopsies were obtained of the gastric mucosa as were pictures for documentation.  The endoscope was then withdrawn through the esophagus which appeared normal.  The patient tolerated this procedure well. 2.R/o intestinal metaplasia 3.Post vagotomy gastroparesis 4.No evidence of recurrent cancer RECOMMENDATIONS: 1.  Await pathology results 2.  Continue current meds 3. Needs B12 level 4.trial of domperidome 10 mg ac 5.Gastroparesis diet  _______________________________ eSigned:  Mardella Layman, MD, [facility] 12/05/2012 11:42 AM  cc Dr.James John standard discharge   PATIENT NAME:  Shari, Mcmahon MR#: [id number]

## 2012-12-05 NOTE — Progress Notes (Signed)
Patient did not have preoperative order for IV antibiotic SSI prophylaxis. (G8918)  Patient did not experience any of the following events: a burn prior to discharge; a fall within the facility; wrong site/side/patient/procedure/implant event; or a hospital transfer or hospital admission upon discharge from the facility. (G8907)  

## 2012-12-05 NOTE — Progress Notes (Signed)
Opened in error; lab already ordered

## 2012-12-06 ENCOUNTER — Telehealth: Payer: Self-pay | Admitting: *Deleted

## 2012-12-06 ENCOUNTER — Other Ambulatory Visit: Payer: Self-pay

## 2012-12-06 ENCOUNTER — Telehealth: Payer: Self-pay

## 2012-12-06 MED ORDER — HYDROCODONE-ACETAMINOPHEN 5-325 MG PO TABS: 1.0000 | ORAL_TABLET | Freq: Two times a day (BID) | ORAL | Status: DC | PRN

## 2012-12-06 MED ORDER — OMEPRAZOLE 20 MG PO CPDR: 20.0000 mg | DELAYED_RELEASE_CAPSULE | Freq: Every day | ORAL | Status: DC

## 2012-12-06 NOTE — Telephone Encounter (Signed)
Informed pt Dr Jarold Motto would like to try a new med on her and I have to order it from [place] and she will have to pay cash for it. Pt stated understanding and stated it's OK to order it. LMOM for her daughter, Annice Pih to call back.

## 2012-12-06 NOTE — Telephone Encounter (Signed)
Done hardcopy to [facility]bin  

## 2012-12-06 NOTE — Telephone Encounter (Signed)
Faxed hardcopy to pharmacy. 

## 2012-12-06 NOTE — Telephone Encounter (Signed)
  Follow up Call-  Call back number 12/05/2012  Post procedure Call Back phone  # [phone]  Permission to leave phone message Yes     Patient questions:  Do you have a fever, pain , or abdominal swelling? no Pain Score  0 *  Have you tolerated food without any problems? yes  Have you been able to return to your normal activities? yes  Do you have any questions about your discharge instructions: Diet   no Medications  no Follow up visit  no  Do you have questions or concerns about your Care? no  Actions: * If pain score is 4 or above: No action needed, pain <4.

## 2012-12-07 NOTE — Telephone Encounter (Signed)
Pt will have her daughter call me. Informed her she will need B12 injections as well.

## 2012-12-10 ENCOUNTER — Encounter: Payer: Self-pay | Admitting: Gastroenterology

## 2012-12-10 MED ORDER — CYANOCOBALAMIN 1000 MCG/ML IJ SOLN: INTRAMUSCULAR | Status: DC

## 2012-12-10 NOTE — Telephone Encounter (Signed)
Explained to daughter about Domperidone. She will monitor pt for ill effects. Also informed her of the need for B12 injections, she will give them to her mom. Informed pt I faxed the script for Domperidone and I ordered B12 and needles for her injections; pt stated understanding.

## 2012-12-17 ENCOUNTER — Telehealth: Payer: Self-pay

## 2012-12-17 MED ORDER — ALLOPURINOL 100 MG PO TABS: 100.0000 mg | ORAL_TABLET | Freq: Every day | ORAL | Status: DC

## 2012-12-17 NOTE — Telephone Encounter (Signed)
Medication refill

## 2013-01-08 ENCOUNTER — Other Ambulatory Visit: Payer: Self-pay | Admitting: Internal Medicine

## 2013-03-08 ENCOUNTER — Encounter: Payer: Self-pay | Admitting: Internal Medicine

## 2013-04-03 ENCOUNTER — Other Ambulatory Visit: Payer: Self-pay

## 2013-04-03 MED ORDER — GLUCOSE BLOOD VI STRP: ORAL_STRIP | Status: DC

## 2013-04-08 ENCOUNTER — Telehealth: Payer: Self-pay

## 2013-04-08 MED ORDER — TIZANIDINE HCL 4 MG PO TABS: 4.0000 mg | ORAL_TABLET | Freq: Four times a day (QID) | ORAL | Status: DC | PRN

## 2013-04-08 NOTE — Telephone Encounter (Signed)
Ok for tizanidine asd,  to f/u any worsening symptoms or concerns j

## 2013-04-08 NOTE — Telephone Encounter (Signed)
The patient is having bad cramps at night in her toes, fingers, feet and legs.  She has been taking a tsp. Of mustard and OTC that use to help but no longer does.  She would like something called into to [facility] please.  Call back number is [phone]

## 2013-04-08 NOTE — Telephone Encounter (Signed)
Patient informed. 

## 2013-04-15 ENCOUNTER — Telehealth: Payer: Self-pay | Admitting: Gastroenterology

## 2013-04-15 MED ORDER — AMBULATORY NON FORMULARY MEDICATION: Status: DC

## 2013-04-15 NOTE — Telephone Encounter (Signed)
Patient notified

## 2013-04-15 NOTE — Telephone Encounter (Signed)
RX faxed

## 2013-05-08 ENCOUNTER — Telehealth: Payer: Self-pay

## 2013-05-08 MED ORDER — AMLODIPINE BESYLATE 5 MG PO TABS: 5.0000 mg | ORAL_TABLET | Freq: Every day | ORAL | Status: DC

## 2013-05-08 MED ORDER — LOSARTAN POTASSIUM 100 MG PO TABS: 100.0000 mg | ORAL_TABLET | Freq: Every day | ORAL | Status: DC

## 2013-05-08 NOTE — Telephone Encounter (Signed)
refill 

## 2013-05-21 ENCOUNTER — Ambulatory Visit (INDEPENDENT_AMBULATORY_CARE_PROVIDER_SITE_OTHER): Payer: Medicare Other | Admitting: Internal Medicine

## 2013-05-21 ENCOUNTER — Encounter: Payer: Self-pay | Admitting: Internal Medicine

## 2013-05-21 VITALS — BP 132/70 | HR 74 | Temp 98.6°F | Ht 64.0 in | Wt 180.8 lb

## 2013-05-21 DIAGNOSIS — I10 Essential (primary) hypertension: Secondary | ICD-10-CM

## 2013-05-21 DIAGNOSIS — Z0000 Encounter for general adult medical examination without abnormal findings: Secondary | ICD-10-CM

## 2013-05-21 DIAGNOSIS — E119 Type 2 diabetes mellitus without complications: Secondary | ICD-10-CM

## 2013-05-21 DIAGNOSIS — E785 Hyperlipidemia, unspecified: Secondary | ICD-10-CM

## 2013-05-21 NOTE — Patient Instructions (Signed)
OK to stop the glucotrol Please continue all other medications as before, and refills have been done if requested. Please have the pharmacy call with any other refills you may need. I think we can hold off on more blood work today Please continue your efforts at being more active, low cholesterol diet, and weight control.  Please remember to sign up for My Chart if you have not done so, as this will be important to you in the future with finding out test results, communicating by private email, and scheduling acute appointments online when needed.  Please return in 6 months, or sooner if needed, with Lab testing done 3-5 days before

## 2013-05-21 NOTE — Assessment & Plan Note (Signed)
stable overall by history and exam, recent data reviewed with pt, and pt to continue medical treatment as before,  to f/u any worsening symptoms or concerns BP Readings from Last 3 Encounters:  05/21/13 132/70  12/05/12 155/63  12/04/12 124/50

## 2013-05-21 NOTE — Assessment & Plan Note (Signed)
stable overall by history and exam, recent data reviewed with pt, and pt to continue medical treatment as before,  to f/u any worsening symptoms or concerns Lab Results  Component Value Date   LDLCALC 45 11/14/2012

## 2013-05-21 NOTE — Assessment & Plan Note (Signed)
stable overall by history and exam, recent data reviewed with pt, and pt to continue off the glipizide,  to f/u any worsening symptoms or concerns Lab Results  Component Value Date   HGBA1C 5.9 11/14/2012

## 2013-05-21 NOTE — Progress Notes (Signed)
Subjective:    Patient ID: Shari Mcmahon, female    DOB: [birth year], 79 y.o.   MRN: [id number]  HPI Here to f/u; overall doing ok,  Pt denies chest pain, increased sob or doe, wheezing, orthopnea, PND, increased LE swelling, palpitations, dizziness or syncope.  Pt denies polydipsia, polyuria, or low sugar symptoms such as weakness or confusion improved with po intake.  Pt denies new neurological symptoms such as new headache, or facial or extremity weakness or numbness.   Pt states overall good compliance with meds, has been trying to follow lower cholesterol, diabetic diet, with wt overall stable,  but little exercise however, trying to do stationary bike daily, walks with cane and some days can walk around the building 3 times, needs new bilat knees but declined due to age. Seen GI - now on B12.  Has seen ortho for cortisone which helps to the knees, last 1 mo ago, only last for 2 wks, willing to try the gel next visit. cb's three times weekly in low 100's. Not taking the glipizide recently Past Medical History  Diagnosis Date  . ABDOMINAL PAIN, CHRONIC 04/07/2008  . Abdominal pain, generalized 12/24/2009  . ARTHRITIS 03/03/2008  . BRADYCARDIA, CHRONIC 12/17/2008  . CARDIAC MURMUR 02/01/2010  . CONSTIPATION, CHRONIC 10/15/2010  . DEGENERATIVE JOINT DISEASE, KNEES, BILATERAL 12/07/2007  . DEPRESSION 06/17/2009  . DIABETES MELLITUS, TYPE II 09/19/2007  . DYSPHAGIA UNSPECIFIED 12/24/2009  . FATIGUE 01/27/2010  . FEVER UNSPECIFIED 09/30/2008  . Gastroparesis 12/24/2009  . GENERALIZED OSTEOARTHROSIS INVOLVING HAND 10/03/2007  . GLAUCOMA 09/19/2007  . GOUTY ARTHROPATHY UNSPECIFIED 02/17/2009  . GOUT 09/19/2007  . HYPERLIPIDEMIA 01/27/2010  . HYPERTENSION 09/19/2007  . LUNG NODULE 03/03/2008  . MENOPAUSAL DISORDER 12/17/2008  . PERIPHERAL NEUROPATHY 06/19/2008  . PERSONAL HISTORY MALIGNANT NEOPLASM STOMACH 09/19/2007  . 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 STOMACH CANCER 03/03/2008  . UTI  12/15/2009  . Arthritis of knee, degenerative 10/19/2011   Past Surgical History  Procedure Laterality Date  . Vagotomy    . Partial gastrectomy    . Billroth ii gastorenterostomy      reports that she has quit smoking. Her smoking use included Cigarettes. She smoked 0.00 packs per day. She has never used smokeless tobacco. She reports that she does not drink alcohol or use illicit drugs. family history includes Cancer in her sister; Colon cancer in her maternal grandmother and paternal grandfather; Diabetes in her other; Stroke in her father; and Uterine cancer in her mother. Allergies  Allergen Reactions  . Dulcolax (Bisacodyl)   . Hydrocodone-Acetaminophen     REACTION: Rash  . Penicillins   . Timolol     REACTION: bradycardia worse   Current Outpatient Prescriptions on File Prior to Visit  Medication Sig Dispense Refill  . allopurinol (ZYLOPRIM) 100 MG tablet Take 1 tablet (100 mg total) by mouth daily.  30 tablet  11  . AMBULATORY NON FORMULARY MEDICATION Domperidone 10 mg Please take one tablet by mouth three times daily before each meal  90 tablet  3  . amLODipine (NORVASC) 5 MG tablet Take 1 tablet (5 mg total) by mouth daily.  30 tablet  6  . celecoxib (CELEBREX) 200 MG capsule Take 200 mg by mouth daily.      . cyanocobalamin (,VITAMIN B-12,) 1000 MCG/ML injection Inject 1000 mcg or 1 ml into a muscle once weekly for 3 weeks and then monthly thereafter.  10 mL  1  . glipiZIDE (GLUCOTROL XL) 2.5 MG 24  hr tablet Take 2.5 mg by mouth daily. Only when taking cortisone injections      . glucose blood (FREESTYLE LITE) test strip Use as directed three times daily to check blood sugar.  Diagnosis code 250.00  100 each  11  . HYDROcodone-acetaminophen (NORCO/VICODIN) 5-325 MG per tablet Take 1 tablet by mouth 2 (two) times daily as needed.  60 tablet  2  . latanoprost (XALATAN) 0.005 % ophthalmic solution Place 1 drop into both eyes daily.        Marland Kitchen losartan (COZAAR) 100 MG tablet Take  1 tablet (100 mg total) by mouth daily.  90 tablet  1  . omeprazole (PRILOSEC) 20 MG capsule Take 1 capsule (20 mg total) by mouth daily.  90 capsule  3  . pravastatin (PRAVACHOL) 10 MG tablet TAKE 1 TABLET (10 MG TOTAL) BY MOUTH EVERY EVENING.  90 tablet  3  . tiZANidine (ZANAFLEX) 4 MG tablet Take 1 tablet (4 mg total) by mouth every 6 (six) hours as needed.  60 tablet  2   No current facility-administered medications on file prior to visit.   Review of Systems  Constitutional: Negative for unexpected weight change, or unusual diaphoresis  HENT: Negative for tinnitus.   Eyes: Negative for photophobia and visual disturbance.  Respiratory: Negative for choking and stridor.   Gastrointestinal: Negative for vomiting and blood in stool.  Genitourinary: Negative for hematuria and decreased urine volume.  Musculoskeletal: Negative for acute joint swelling Skin: Negative for color change and wound.  Neurological: Negative for tremors and numbness other than noted  Psychiatric/Behavioral: Negative for decreased concentration or  hyperactivity.       Objective:   Physical Exam BP 132/70  Pulse 74  Temp(Src) 98.6 F (37 C) (Oral)  Ht 5\' 4"  (1.626 m)  Wt 180 lb 12 oz (81.988 kg)  BMI 31.01 kg/m2  SpO2 98% VS noted,  Constitutional: Pt appears well-developed and well-nourished.  HENT: Head: NCAT.  Right Ear: External ear normal.  Left Ear: External ear normal.  Eyes: Conjunctivae and EOM are normal. Pupils are equal, round, and reactive to light.  Neck: Normal range of motion. Neck supple.  Cardiovascular: Normal rate and regular rhythm.   Pulmonary/Chest: Effort normal and breath sounds normal.  Abd:  Soft, NT, non-distended, + BS Neurological: Pt is alert. Not confused., walks with cane  Skin: Skin is warm. No erythema.  Psychiatric: Pt behavior is normal. Thought content normal.      Assessment & Plan:

## 2013-05-30 ENCOUNTER — Other Ambulatory Visit: Payer: Self-pay | Admitting: Internal Medicine

## 2013-05-30 NOTE — Telephone Encounter (Signed)
Done hardcopy to [facility]bin  

## 2013-05-31 NOTE — Telephone Encounter (Signed)
Faxed hardcopy to [facility]. 

## 2013-06-05 ENCOUNTER — Telehealth: Payer: Self-pay | Admitting: Gastroenterology

## 2013-06-05 MED ORDER — AMBULATORY NON FORMULARY MEDICATION: Status: DC

## 2013-06-05 NOTE — Telephone Encounter (Signed)
I advised patient that we use a pharmacy in [place]  Patient verbalized understanding  RX faxed

## 2013-11-12 ENCOUNTER — Other Ambulatory Visit: Payer: Self-pay | Admitting: Internal Medicine

## 2013-11-12 NOTE — Telephone Encounter (Signed)
Refill done.  

## 2013-11-19 ENCOUNTER — Other Ambulatory Visit (INDEPENDENT_AMBULATORY_CARE_PROVIDER_SITE_OTHER): Payer: Medicare Other

## 2013-11-19 ENCOUNTER — Telehealth: Payer: Self-pay | Admitting: Internal Medicine

## 2013-11-19 DIAGNOSIS — Z0000 Encounter for general adult medical examination without abnormal findings: Secondary | ICD-10-CM

## 2013-11-19 DIAGNOSIS — E119 Type 2 diabetes mellitus without complications: Secondary | ICD-10-CM

## 2013-11-19 LAB — HEPATIC FUNCTION PANEL
ALBUMIN: 3.3 g/dL — AB (ref 3.5–5.2)
ALT: 14 U/L (ref 0–35)
AST: 28 U/L (ref 0–37)
Alkaline Phosphatase: 82 U/L (ref 39–117)
Bilirubin, Direct: 0.1 mg/dL (ref 0.0–0.3)
TOTAL PROTEIN: 7 g/dL (ref 6.0–8.3)
Total Bilirubin: 0.5 mg/dL (ref 0.3–1.2)

## 2013-11-19 LAB — CBC WITH DIFFERENTIAL/PLATELET
BASOS PCT: 0.6 % (ref 0.0–3.0)
Basophils Absolute: 0 10*3/uL (ref 0.0–0.1)
EOS PCT: 3.2 % (ref 0.0–5.0)
Eosinophils Absolute: 0.3 10*3/uL (ref 0.0–0.7)
HCT: 36.7 % (ref 36.0–46.0)
Hemoglobin: 11.6 g/dL — ABNORMAL LOW (ref 12.0–15.0)
LYMPHS PCT: 23 % (ref 12.0–46.0)
Lymphs Abs: 1.8 10*3/uL (ref 0.7–4.0)
MCHC: 31.7 g/dL (ref 30.0–36.0)
MCV: 101.9 fl — ABNORMAL HIGH (ref 78.0–100.0)
Monocytes Absolute: 1.1 10*3/uL — ABNORMAL HIGH (ref 0.1–1.0)
Monocytes Relative: 14.2 % — ABNORMAL HIGH (ref 3.0–12.0)
NEUTROS PCT: 59 % (ref 43.0–77.0)
Neutro Abs: 4.6 10*3/uL (ref 1.4–7.7)
Platelets: 176 10*3/uL (ref 150.0–400.0)
RBC: 3.6 Mil/uL — AB (ref 3.87–5.11)
RDW: 15.3 % — ABNORMAL HIGH (ref 11.5–14.6)
WBC: 7.8 10*3/uL (ref 4.5–10.5)

## 2013-11-19 LAB — BASIC METABOLIC PANEL
BUN: 14 mg/dL (ref 6–23)
CHLORIDE: 110 meq/L (ref 96–112)
CO2: 24 meq/L (ref 19–32)
CREATININE: 1.1 mg/dL (ref 0.4–1.2)
Calcium: 8.8 mg/dL (ref 8.4–10.5)
GFR: 61.36 mL/min (ref >60.00)
Glucose, Bld: 89 mg/dL (ref 70–99)
Potassium: 4.6 mEq/L (ref 3.5–5.1)
Sodium: 141 mEq/L (ref 135–145)

## 2013-11-19 LAB — URINALYSIS, ROUTINE W REFLEX MICROSCOPIC
Bilirubin Urine: NEGATIVE
Hgb urine dipstick: NEGATIVE
NITRITE: NEGATIVE
TOTAL PROTEIN, URINE-UPE24: NEGATIVE
URINE GLUCOSE: NEGATIVE
UROBILINOGEN UA: 0.2 (ref 0.0–1.0)
pH: 5.5 (ref 5.0–8.0)

## 2013-11-19 LAB — LIPID PANEL
CHOLESTEROL: 141 mg/dL (ref 0–200)
HDL: 61.2 mg/dL (ref >39.00)
LDL CALC: 57 mg/dL (ref 0–99)
TRIGLYCERIDES: 114 mg/dL (ref 0.0–149.0)
Total CHOL/HDL Ratio: 2
VLDL: 22.8 mg/dL (ref 0.0–40.0)

## 2013-11-19 LAB — TSH: TSH: 0.68 u[IU]/mL (ref 0.35–5.50)

## 2013-11-19 LAB — MICROALBUMIN / CREATININE URINE RATIO
CREATININE, U: 205.1 mg/dL
MICROALB/CREAT RATIO: 1 mg/g (ref 0.0–30.0)
Microalb, Ur: 2 mg/dL — ABNORMAL HIGH (ref 0.0–1.9)

## 2013-11-19 LAB — HEMOGLOBIN A1C: Hgb A1c MFr Bld: 5.9 % (ref 4.6–6.5)

## 2013-11-19 MED ORDER — CIPROFLOXACIN HCL 250 MG PO TABS: 250.0000 mg | ORAL_TABLET | Freq: Two times a day (BID) | ORAL | Status: DC

## 2013-11-19 NOTE — Telephone Encounter (Signed)
Message copied by Biagio Borg on Tue Nov 19, 2013  6:45 PM ------      Message from: Jamesetta Orleans      Created: Tue Nov 19, 2013  6:41 PM       Called the patient and she is urinating more frequently. ------

## 2013-11-19 NOTE — Telephone Encounter (Signed)
Done erx 

## 2013-11-22 ENCOUNTER — Ambulatory Visit (INDEPENDENT_AMBULATORY_CARE_PROVIDER_SITE_OTHER): Payer: Medicare Other | Admitting: Internal Medicine

## 2013-11-22 ENCOUNTER — Telehealth: Payer: Self-pay

## 2013-11-22 ENCOUNTER — Encounter: Payer: Self-pay | Admitting: Internal Medicine

## 2013-11-22 VITALS — BP 142/70 | HR 60 | Temp 97.2°F | Ht 66.0 in | Wt 182.1 lb

## 2013-11-22 DIAGNOSIS — M25511 Pain in right shoulder: Secondary | ICD-10-CM

## 2013-11-22 DIAGNOSIS — Z0000 Encounter for general adult medical examination without abnormal findings: Secondary | ICD-10-CM

## 2013-11-22 DIAGNOSIS — M25519 Pain in unspecified shoulder: Secondary | ICD-10-CM

## 2013-11-22 DIAGNOSIS — M25512 Pain in left shoulder: Secondary | ICD-10-CM

## 2013-11-22 DIAGNOSIS — Z23 Encounter for immunization: Secondary | ICD-10-CM

## 2013-11-22 NOTE — Telephone Encounter (Signed)
Relevant patient education mailed to patient.  

## 2013-11-22 NOTE — Addendum Note (Signed)
Addended by: Sharon Seller B on: 11/22/2013 12:05 PM   Modules accepted: Orders

## 2013-11-22 NOTE — Progress Notes (Signed)
Subjective:    Patient ID: Shari Mcmahon, female    DOB: 03-12-1927, 80 y.o.   MRN: [id number]  HPI Here for wellness and f/u;  Overall doing ok;  Pt denies CP, worsening SOB, DOE, wheezing, orthopnea, PND, worsening LE edema, palpitations, dizziness or syncope.  Pt denies neurological change such as new headache, facial or extremity weakness.  Pt denies polydipsia, polyuria, or low sugar symptoms. Pt states overall good compliance with treatment and medications, good tolerability, and has been trying to follow lower cholesterol diet.  Pt denies worsening depressive symptoms, suicidal ideation or panic. No fever, night sweats, wt loss, loss of appetite, or other constitutional symptoms.  Pt states good ability with ADL's, has low fall risk, home safety reviewed and adequate, no other significant changes in hearing or vision, and occasionally active with exercise with an exercise bike daily, as this help the arthritic pain. Has bilat shoulder pain x 6 mo, unable to raise either overhead., due to severe pain.  Walks with cane, no recent falls. Sees podiatry monthly.  Sees orthopedic for bilat knee DJD every 6 mo or so.  Bladder symptoms improved with recent tx.  Still eating 2 meals per day for over 5 yrs since her gastric surgury. Will have new GI ongoing since Dr Sharlett Iles retiring. Past Medical History  Diagnosis Date  . ABDOMINAL PAIN, CHRONIC 04/07/2008  . Abdominal pain, generalized 12/24/2009  . ARTHRITIS 03/03/2008  . BRADYCARDIA, CHRONIC 12/17/2008  . CARDIAC MURMUR 02/01/2010  . CONSTIPATION, CHRONIC 10/15/2010  . DEGENERATIVE JOINT DISEASE, KNEES, BILATERAL 12/07/2007  . DEPRESSION 06/17/2009  . DIABETES MELLITUS, TYPE II 09/19/2007  . DYSPHAGIA UNSPECIFIED 12/24/2009  . FATIGUE 01/27/2010  . FEVER UNSPECIFIED 09/30/2008  . Gastroparesis 12/24/2009  . GENERALIZED OSTEOARTHROSIS INVOLVING HAND 10/03/2007  . GLAUCOMA 09/19/2007  . GOUTY ARTHROPATHY UNSPECIFIED 02/17/2009  . GOUT 09/19/2007  .  HYPERLIPIDEMIA 01/27/2010  . HYPERTENSION 09/19/2007  . LUNG NODULE 03/03/2008  . MENOPAUSAL DISORDER 12/17/2008  . PERIPHERAL NEUROPATHY 06/19/2008  . PERSONAL HISTORY MALIGNANT NEOPLASM STOMACH 09/19/2007  . 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 STOMACH CANCER 03/03/2008  . UTI 12/15/2009  . Arthritis of knee, degenerative 10/19/2011   Past Surgical History  Procedure Laterality Date  . Vagotomy    . Partial gastrectomy    . Billroth ii gastorenterostomy      reports that she has quit smoking. Her smoking use included Cigarettes. She smoked 0.00 packs per day. She has never used smokeless tobacco. She reports that she does not drink alcohol or use illicit drugs. family history includes Cancer in her sister; Colon cancer in her maternal grandmother and paternal grandfather; Diabetes in her other; Stroke in her father; Uterine cancer in her mother. Allergies  Allergen Reactions  . Dulcolax [Bisacodyl]   . Hydrocodone-Acetaminophen     REACTION: Rash  . Penicillins   . Timolol     REACTION: bradycardia worse   Current Outpatient Prescriptions on File Prior to Visit  Medication Sig Dispense Refill  . allopurinol (ZYLOPRIM) 100 MG tablet Take 1 tablet (100 mg total) by mouth daily.  30 tablet  11  . AMBULATORY NON FORMULARY MEDICATION Domperidone 10 mg Please take one tablet by mouth three times daily before each meal  90 tablet  3  . amLODipine (NORVASC) 5 MG tablet Take 1 tablet (5 mg total) by mouth daily.  30 tablet  6  . celecoxib (CELEBREX) 200 MG capsule Take 200 mg by mouth daily.      Marland Kitchen  cyanocobalamin (,VITAMIN B-12,) 1000 MCG/ML injection Inject 1000 mcg or 1 ml into a muscle once weekly for 3 weeks and then monthly thereafter.  10 mL  1  . glucose blood (FREESTYLE LITE) test strip Use as directed three times daily to check blood sugar.  Diagnosis code 250.00  100 each  11  . HYDROcodone-acetaminophen (NORCO/VICODIN) 5-325 MG per tablet TAKE 1 TABLET BY MOUTH 2  TIMES A DAY AS NEEDED.  60 tablet  2  . latanoprost (XALATAN) 0.005 % ophthalmic solution Place 1 drop into both eyes daily.        Marland Kitchen losartan (COZAAR) 100 MG tablet TAKE 1 TABLET BY MOUTH ONCE DAILY.  90 tablet  0  . omeprazole (PRILOSEC) 20 MG capsule Take 1 capsule (20 mg total) by mouth daily.  90 capsule  3  . pravastatin (PRAVACHOL) 10 MG tablet TAKE 1 TABLET (10 MG TOTAL) BY MOUTH EVERY EVENING.  90 tablet  3  . tiZANidine (ZANAFLEX) 4 MG tablet Take 1 tablet (4 mg total) by mouth every 6 (six) hours as needed.  60 tablet  2   No current facility-administered medications on file prior to visit.      Review of Systems Constitutional: Negative for diaphoresis, activity change, appetite change or unexpected weight change.  HENT: Negative for hearing loss, ear pain, facial swelling, mouth sores and neck stiffness.   Eyes: Negative for pain, redness and visual disturbance.  Respiratory: Negative for shortness of breath and wheezing.   Cardiovascular: Negative for chest pain and palpitations.  Gastrointestinal: Negative for diarrhea, blood in stool, abdominal distention or other pain Genitourinary: Negative for hematuria, flank pain or change in urine volume.  Musculoskeletal: Negative for myalgias and joint swelling.  Skin: Negative for color change and wound.  Neurological: Negative for syncope and numbness. other than noted Hematological: Negative for adenopathy.  Psychiatric/Behavioral: Negative for hallucinations, self-injury, decreased concentration and agitation.      Objective:   Physical Exam BP 142/70  Pulse 60  Temp(Src) 97.2 F (36.2 C) (Oral)  Ht 5\' 6"  (1.676 m)  Wt 182 lb 2 oz (82.611 kg)  BMI 29.41 kg/m2  SpO2 97% VS noted,  Constitutional: Pt is oriented to person, place, and time. Appears well-developed and well-nourished.  Head: Normocephalic and atraumatic.  Right Ear: External ear normal.  Left Ear: External ear normal.  Nose: Nose normal.  Mouth/Throat:  Oropharynx is clear and moist.  Eyes: Conjunctivae and EOM are normal. Pupils are equal, round, and reactive to light.  Neck: Normal range of motion. Neck supple. No JVD present. No tracheal deviation present.  Cardiovascular: Normal rate, regular rhythm, normal heart sounds and intact distal pulses.   Pulmonary/Chest: Effort normal and breath sounds normal.  Abdominal: Soft. Bowel sounds are normal. There is no tenderness. No HSM  Musculoskeletal: Normal range of motion. Exhibits no edema.  Lymphadenopathy:  Has no cervical adenopathy.  Neurological: Pt is alert and oriented to person, place, and time. Pt has normal reflexes. No cranial nerve deficit.  Skin: Skin is warm and dry. No rash noted. no LE edema Psychiatric:  Has  normal mood and affect. Behavior is normal.     Assessment & Plan:

## 2013-11-22 NOTE — Patient Instructions (Addendum)
You had the new Prevnar pneumonia shot today Please continue all other medications as before, and refills have been done if requested. Please have the pharmacy call with any other refills you may need. Please continue your efforts at being more active, low cholesterol diet, and weight control. You are otherwise up to date with prevention measures today. Please keep your appointments with your specialists as you may have planned - the new gastroenterologist   You will be contacted regarding the referral for: Dr Tamala Julian (Sports Medicine) in our office for the shoulders pain  You are given the copy of your Lab work today  Please return in 6 months, or sooner if needed

## 2013-11-22 NOTE — Progress Notes (Signed)
Pre-visit discussion using our clinic review tool. No additional management support is needed unless otherwise documented below in the visit note.  

## 2013-11-27 ENCOUNTER — Ambulatory Visit: Payer: Medicare Other | Admitting: Family Medicine

## 2013-12-02 ENCOUNTER — Other Ambulatory Visit: Payer: Self-pay | Admitting: Internal Medicine

## 2013-12-02 MED ORDER — HYDROCODONE-ACETAMINOPHEN 5-325 MG PO TABS: ORAL_TABLET | ORAL | Status: DC

## 2013-12-02 NOTE — Telephone Encounter (Signed)
Done hardcopy to [facility]bin  

## 2013-12-03 ENCOUNTER — Other Ambulatory Visit: Payer: Self-pay | Admitting: Internal Medicine

## 2013-12-03 NOTE — Telephone Encounter (Signed)
Patient phoned requesting that son, Alvaretta Eisenberger, be allowed to pick up hydrocodone prescription today, when he drops off the handicapp placard forms. Advised patient he would need a form of identification to p/u script.  States he's on his way now.

## 2013-12-03 NOTE — Telephone Encounter (Signed)
Called the patient informed to pickup hardcopy at the front desk. 

## 2013-12-03 NOTE — Telephone Encounter (Signed)
Patient has been informed.

## 2013-12-04 ENCOUNTER — Encounter: Payer: Self-pay | Admitting: Internal Medicine

## 2013-12-06 ENCOUNTER — Encounter: Payer: Self-pay | Admitting: Internal Medicine

## 2013-12-06 LAB — HM DIABETES EYE EXAM: HM Diabetic Eye Exam: NOT DETECTED

## 2013-12-09 ENCOUNTER — Encounter: Payer: Self-pay | Admitting: Family Medicine

## 2013-12-09 ENCOUNTER — Ambulatory Visit (INDEPENDENT_AMBULATORY_CARE_PROVIDER_SITE_OTHER): Payer: Medicare Other | Admitting: Family Medicine

## 2013-12-09 VITALS — BP 122/70 | HR 96 | Temp 97.9°F | Resp 16 | Wt 182.4 lb

## 2013-12-09 DIAGNOSIS — M12811 Other specific arthropathies, not elsewhere classified, right shoulder: Secondary | ICD-10-CM

## 2013-12-09 DIAGNOSIS — M25511 Pain in right shoulder: Secondary | ICD-10-CM

## 2013-12-09 DIAGNOSIS — M75102 Unspecified rotator cuff tear or rupture of left shoulder, not specified as traumatic: Secondary | ICD-10-CM

## 2013-12-09 DIAGNOSIS — M25519 Pain in unspecified shoulder: Secondary | ICD-10-CM

## 2013-12-09 DIAGNOSIS — M19019 Primary osteoarthritis, unspecified shoulder: Secondary | ICD-10-CM

## 2013-12-09 DIAGNOSIS — M25512 Pain in left shoulder: Principal | ICD-10-CM

## 2013-12-09 DIAGNOSIS — M12812 Other specific arthropathies, not elsewhere classified, left shoulder: Secondary | ICD-10-CM

## 2013-12-09 DIAGNOSIS — M75101 Unspecified rotator cuff tear or rupture of right shoulder, not specified as traumatic: Secondary | ICD-10-CM

## 2013-12-09 HISTORY — DX: Unspecified rotator cuff tear or rupture of right shoulder, not specified as traumatic: M75.101

## 2013-12-09 HISTORY — DX: Other specific arthropathies, not elsewhere classified, right shoulder: M12.811

## 2013-12-09 NOTE — Patient Instructions (Signed)
Good to meet you We did 2 injections today for your osteoarthritis and rotator cuff tear in both of your shoulders I am giving you some exercises, focus on range of motion.  Physical therapy will be calling you to make an appointment. Take tylenol 650 mg three times a day is the best evidence based medicine we have for arthritis.  Glucosamine sulfate 750mg  twice a day is a supplement that has been shown to help moderate to severe arthritis. Vitamin D 2000 IU daily Fish oil 2 grams daily.  Tumeric 500mg  twice daily.  Capsaicin topically up to four times a day may also help with pain. Water aerobics and cycling with low resistance are the best two types of exercise for arthritis. Come back and see me in 4 weeks.

## 2013-12-09 NOTE — Progress Notes (Signed)
Pre-visit discussion using our clinic review tool. No additional management support is needed unless otherwise documented below in the visit note.  

## 2013-12-09 NOTE — Progress Notes (Signed)
[name] [place], [place] [address] Phone: [phone] Subjective:    I'm seeing this patient by the request  of:  Cathlean Cower, MD   CC: bilateral shoulder pain.   [id number] Shari Mcmahon is a 80 y.o. female coming in with complaint of bilateral shoulder pain. Patient has had bilateral shoulder pain for quite some time. Patient does not remember any injury and states that this is been more insidious onset. Patient states though now she is having trouble with activities of daily living secondary to pain. Patient is found it difficult to even, her hair because she is unable to lift her arm secondary to weakness. Patient does have a past medical history significant for significant osteoarthritis of multiple joints as well as diabetes complicated by peripheral neuropathy. Patient has not tried anything specific for the shoulders. The patient has been on pain medications or other problems and states that this helps minimally. Patient states that this is more of a chronic dull aching sensation with sharp pain with certain movements. Patient also states that this is associated with weakness bilaterally right greater than left. States that it is difficult to sleep. Patient was the severity of 9/10.     Past medical history, social, surgical and family history all reviewed in electronic medical record.   Review of Systems: No headache, visual changes, nausea, vomiting, diarrhea, constipation, dizziness, abdominal pain, skin rash, fevers, chills, night sweats, weight loss, swollen lymph nodes, body aches, joint swelling, muscle aches, chest pain, shortness of breath, mood changes.   Objective Blood pressure 122/70, pulse 96, temperature 97.9 F (36.6 C), temperature source Oral, resp. rate 16, weight 182 lb 6.4 oz (82.736 kg), SpO2 98.00%.  General: No apparent distress alert and oriented x3 mood and affect normal, dressed appropriately.  HEENT: Pupils  equal, extraocular movements intact  Respiratory: Patient's speak in full sentences and does not appear short of breath  Cardiovascular: No lower extremity edema, non tender, no erythema  Skin: Warm dry intact with no signs of infection or rash on extremities or on axial skeleton.  Abdomen: Soft nontender  Neuro: Cranial nerves II through XII are intact, neurovascularly intact in all extremities with 2+ DTRs and 2+ pulses.  Lymph: No lymphadenopathy of posterior or anterior cervical chain or axillae bilaterally.  Gait normal with good balance and coordination.  MSK: Non tender with full range of motion and good stability and symmetric strength and tone of elbows, wrist, knee and ankles bilaterally. Significant osteoarthritic changes in multiple joints. Shoulder: Bilateral Inspection reveals no abnormalities, atrophy or asymmetry. Palpation is normal with no tenderness over AC joint or bicipital groove. Patient does have diffuse tenderness to the shoulder joint bilaterally ROM is decreased actively to forward flexion of 60 abduction to 40 and internal rotation to and lateral head. Passive range of motion does have forward flexion to 120 but significant crepitus causes patient pain. Other testing was not done secondary to herniated patient. Rotator cuff strength is 3/5 bilaterally signs of impingement with positive Neer and Hawkin's tests, empty can sign. Speeds and Yergason's tests normal. No labral pathology noted with negative Obrien's, negative clunk and good stability. Normal scapular function observed. No painful arc and no drop arm sign. No apprehension sign  Procedure: Real-time Ultrasound Guided Injection of right glenohumeral joint Device: GE Logiq E  Ultrasound guided injection is preferred based studies that show increased duration, increased effect, greater accuracy, decreased procedural pain, increased response rate with ultrasound guided  versus blind injection.  Verbal  informed consent obtained.  Time-out conducted.  Noted no overlying erythema, induration, or other signs of local infection.  Skin prepped in a sterile fashion.  Local anesthesia: Topical Ethyl chloride.  With sterile technique and under real time ultrasound guidance:  Joint visualized.  23g 1  inch needle inserted posterior approach. Pictures taken for needle placement. Patient did have injection of 2 cc of 1% lidocaine, 2 cc of 0.5% Marcaine, and 1.0 cc of Kenalog 40 mg/dL. Completed without difficulty  Pain immediately resolved suggesting accurate placement of the medication.  Advised to call if fevers/chills, erythema, induration, drainage, or persistent bleeding.  Images permanently stored and available for review in the ultrasound unit.  Impression: Technically successful ultrasound guided injection.   Procedure: Real-time Ultrasound Guided Injection of left glenohumeral joint Device: GE Logiq E  Ultrasound guided injection is preferred based studies that show increased duration, increased effect, greater accuracy, decreased procedural pain, increased response rate with ultrasound guided versus blind injection.  Verbal informed consent obtained.  Time-out conducted.  Noted no overlying erythema, induration, or other signs of local infection.  Skin prepped in a sterile fashion.  Local anesthesia: Topical Ethyl chloride.  With sterile technique and under real time ultrasound guidance:  Joint visualized.  23g 1  inch needle inserted posterior approach. Pictures taken for needle placement. Patient did have injection of 2 cc of 1% lidocaine, 2 cc of 0.5% Marcaine, and 1cc of Kenalog 40 mg/dL. Completed without difficulty  Pain immediately resolved suggesting accurate placement of the medication.  Advised to call if fevers/chills, erythema, induration, drainage, or persistent bleeding.  Images permanently stored and available for review in the ultrasound unit.  Impression: Technically  successful ultrasound guided injection.  X-rays are reviewed and interpreted by me today. Patient's x-rays from 2008 show moderate osteoarthritic changes of the right shoulder previously.     Impression and Recommendations:     This case required medical decision making of moderate complexity.

## 2013-12-09 NOTE — Assessment & Plan Note (Signed)
Patient does have bilateral shoulder pain. Patient did have injection today and did have almost complete resolution of pain. Discuss some home exercises really focusing on range of motion and likely not significant strengthening In addition to this patient will also formal physical therapy. I do think patient does have severe underlying osteoarthritis of the shoulders and likely only curative treatment option would be shoulder replacement which I think with patient's age I do not know how she would respond. We'll continue with conservative therapy and can do injections every 3 months if necessary. We'll see patient again in 4 weeks for further evaluation

## 2013-12-20 ENCOUNTER — Other Ambulatory Visit: Payer: Self-pay | Admitting: Internal Medicine

## 2013-12-23 ENCOUNTER — Ambulatory Visit: Payer: Medicare Other | Attending: Family Medicine | Admitting: Physical Therapy

## 2013-12-23 DIAGNOSIS — IMO0001 Reserved for inherently not codable concepts without codable children: Secondary | ICD-10-CM | POA: Insufficient documentation

## 2013-12-23 DIAGNOSIS — M25519 Pain in unspecified shoulder: Secondary | ICD-10-CM | POA: Insufficient documentation

## 2013-12-23 DIAGNOSIS — M25619 Stiffness of unspecified shoulder, not elsewhere classified: Secondary | ICD-10-CM | POA: Insufficient documentation

## 2013-12-30 ENCOUNTER — Ambulatory Visit: Payer: Medicare Other | Attending: Family Medicine

## 2013-12-30 DIAGNOSIS — M25519 Pain in unspecified shoulder: Secondary | ICD-10-CM | POA: Insufficient documentation

## 2013-12-30 DIAGNOSIS — IMO0001 Reserved for inherently not codable concepts without codable children: Secondary | ICD-10-CM | POA: Insufficient documentation

## 2013-12-30 DIAGNOSIS — M25619 Stiffness of unspecified shoulder, not elsewhere classified: Secondary | ICD-10-CM | POA: Insufficient documentation

## 2013-12-31 ENCOUNTER — Encounter: Payer: Self-pay | Admitting: Internal Medicine

## 2014-01-01 ENCOUNTER — Ambulatory Visit: Payer: Medicare Other

## 2014-01-06 ENCOUNTER — Ambulatory Visit: Payer: Medicare Other | Admitting: Rehabilitation

## 2014-01-08 ENCOUNTER — Ambulatory Visit (INDEPENDENT_AMBULATORY_CARE_PROVIDER_SITE_OTHER): Payer: Medicare Other | Admitting: Family Medicine

## 2014-01-08 ENCOUNTER — Encounter: Payer: Self-pay | Admitting: Family Medicine

## 2014-01-08 VITALS — BP 126/74 | HR 64 | Temp 97.9°F | Resp 16 | Wt 180.1 lb

## 2014-01-08 DIAGNOSIS — M1710 Unilateral primary osteoarthritis, unspecified knee: Secondary | ICD-10-CM | POA: Insufficient documentation

## 2014-01-08 DIAGNOSIS — M75101 Unspecified rotator cuff tear or rupture of right shoulder, not specified as traumatic: Secondary | ICD-10-CM

## 2014-01-08 DIAGNOSIS — M75102 Unspecified rotator cuff tear or rupture of left shoulder, not specified as traumatic: Secondary | ICD-10-CM

## 2014-01-08 DIAGNOSIS — M12811 Other specific arthropathies, not elsewhere classified, right shoulder: Secondary | ICD-10-CM

## 2014-01-08 DIAGNOSIS — M12812 Other specific arthropathies, not elsewhere classified, left shoulder: Secondary | ICD-10-CM

## 2014-01-08 DIAGNOSIS — M19019 Primary osteoarthritis, unspecified shoulder: Secondary | ICD-10-CM

## 2014-01-08 NOTE — Assessment & Plan Note (Signed)
Spent greater than 25 minutes with patient face-to-face and had greater than 50% of counseling including as described above in assessment and plan. Patient has improved significantly after the injection. We can repeat these every 2-3 weeks. Patient knows new insert of treatment would be shoulder replacement since she is not a surgical candidate secondary to her morbidities. Patient will come back again in 2-3 months for further evaluation. In the interim she will finish with formal physical therapy.

## 2014-01-08 NOTE — Assessment & Plan Note (Signed)
Patient did have bilateral injections today. Patient tolerated the procedure well with decreased pain. Discussed over-the-counter medications he can be beneficial as well as home exercise program. She'll continue formal physical therapy for the shoulders as well as the knees. Patient is not a surgical candidate secondary to her comorbidities and we will consider repeating injections every 2-3 months if necessary. Patient will followup in 2-3 months.

## 2014-01-08 NOTE — Progress Notes (Signed)
Pre visit review using our clinic review tool, if applicable. No additional management support is needed unless otherwise documented below in the visit note. 

## 2014-01-08 NOTE — Patient Instructions (Addendum)
Good to see you.  Continue the exercises and physical therapy. Vitamin D 2000 IU daily.  Come back if you need another injection.  Would lik eto wait 3 months between injection if we can.

## 2014-01-08 NOTE — Progress Notes (Signed)
[name] [place], [place] [address] Phone: [phone] Subjective:     CC: bilateral shoulder pain.   [id number] Shari Mcmahon is a 80 y.o. female coming in with complaint of bilateral shoulder pain. She was found to have severe osteoarthritic changes of the shoulders and did have an injection bilaterally. Also did send patient to formal physical therapy and states she is doing very well. No new problem with shoulder.  Happy with results.  Bilateral knee pain- patient does have bilateral knee pain for quite some time and has been diagnosed with arthritis of the knee back in 2012. Patient is a nonoperative candidate. Patient has had multiple steroid injections with the last one being greater than a year ago. Patient started difficult to do activities of daily living secondary to pain mostly on the medial aspect of the knees bilaterally. Denies giving out on her but she does walk with the aid of a cane. Patient is a severity it 8/10. Patient does keep her up at night. Patient is taking vitamin D which has been helpful.      Past medical history, social, surgical and family history all reviewed in electronic medical record.   Review of Systems: No headache, visual changes, nausea, vomiting, diarrhea, constipation, dizziness, abdominal pain, skin rash, fevers, chills, night sweats, weight loss, swollen lymph nodes, body aches, joint swelling, muscle aches, chest pain, shortness of breath, mood changes.   Objective Blood pressure 126/74, pulse 64, temperature 97.9 F (36.6 C), temperature source Oral, resp. rate 16, weight 180 lb 1.9 oz (81.702 kg), SpO2 98.00%.  General: No apparent distress alert and oriented x3 mood and affect normal, dressed appropriately.  HEENT: Pupils equal, extraocular movements intact  Respiratory: Patient's speak in full sentences and does not appear short of breath  Cardiovascular: No lower extremity edema, non  tender, no erythema  Skin: Warm dry intact with no signs of infection or rash on extremities or on axial skeleton.  Abdomen: Soft nontender  Neuro: Cranial nerves II through XII are intact, neurovascularly intact in all extremities with 2+ DTRs and 2+ pulses.  Lymph: No lymphadenopathy of posterior or anterior cervical chain or axillae bilaterally.  Gait normal with good balance and coordination.  MSK: Non tender with full range of motion and good stability and symmetric strength and tone of elbows, wrist, knee and ankles bilaterally. Significant osteoarthritic changes in multiple joints. Shoulder: Bilateral Inspection reveals no abnormalities, atrophy or asymmetry. Palpation is normal with no tenderness over AC joint or bicipital groove. ROM is decreased actively to forward flexion of 60 abduction to 40 and internal rotation to and lateral head. Passive range of motion does have forward flexion to 120 but significant crepitus causes patient pain. Other testing was not done secondary to herniated patient. Rotator cuff strength is 3/5 bilaterally Mild impingement Speeds and Yergason's tests normal. No labral pathology noted with negative Obrien's, negative clunk and good stability. Normal scapular function observed. No painful arc and no drop arm sign. No apprehension sign  Knee: Bilateral Normal to inspection with no erythema or effusion or obvious bony abnormalities. Medial joint line pain.  ROM full in flexion and extension and lower leg rotation. Ligaments with solid consistent endpoints including ACL, PCL, LCL, MCL. Negative Mcmurray's, Apley's, and Thessalonian tests. Non painful patellar compression. Patellar glide with moderate  crepitus. Patellar and quadriceps tendons unremarkable. Hamstring and quadriceps strength is normal.   After informed written and verbal consent, patient was seated on  exam table. Left knee was prepped with alcohol swab and utilizing anterolateral  approach, patient's left knee space was injected with 4:1  marcaine 0.5%: Kenalog 40mg /dL. Patient tolerated the procedure well without immediate complications.  After informed written and verbal consent, patient was seated on exam table. Right knee was prepped with alcohol swab and utilizing anterolateral approach, patient's right knee space was injected with 4:1  marcaine 0.5%: Kenalog 40mg /dL. Patient tolerated the procedure well without immediate complications.     Impression and Recommendations:     This case required medical decision making of moderate complexity.

## 2014-01-09 ENCOUNTER — Encounter: Payer: Medicare Other | Admitting: Rehabilitation

## 2014-01-27 ENCOUNTER — Telehealth: Payer: Self-pay

## 2014-01-27 NOTE — Telephone Encounter (Signed)
Spoke with pt, scheduled appoint 

## 2014-01-27 NOTE — Telephone Encounter (Signed)
[name] called and is needing to speak with someone regarding her blood pressure medication. She states her blood pressure has dropped significantly, that she is dizzy, and unable to move around like normal.  She believes this is due to her taking two blood pressure medications.  She is hoping a CMA will call her back to discuss this. Thanks!  Callback [phone], daughter Malachi Bonds)

## 2014-01-29 ENCOUNTER — Ambulatory Visit (INDEPENDENT_AMBULATORY_CARE_PROVIDER_SITE_OTHER): Payer: Medicare Other | Admitting: Internal Medicine

## 2014-01-29 ENCOUNTER — Encounter: Payer: Self-pay | Admitting: Internal Medicine

## 2014-01-29 VITALS — BP 172/72 | HR 67 | Temp 97.5°F | Wt 181.5 lb

## 2014-01-29 DIAGNOSIS — F3289 Other specified depressive episodes: Secondary | ICD-10-CM

## 2014-01-29 DIAGNOSIS — E119 Type 2 diabetes mellitus without complications: Secondary | ICD-10-CM

## 2014-01-29 DIAGNOSIS — F329 Major depressive disorder, single episode, unspecified: Secondary | ICD-10-CM

## 2014-01-29 DIAGNOSIS — I10 Essential (primary) hypertension: Secondary | ICD-10-CM

## 2014-01-29 NOTE — Progress Notes (Signed)
Subjective:    Patient ID: Shari Mcmahon, female    DOB: September 27, 1927, 80 y.o.   MRN: [id number]  HPI  Here to f/u; overall doing ok,  Pt denies chest pain, increased sob or doe, wheezing, orthopnea, PND, increased LE swelling, palpitations, though did have dizziness and difficulty speaking with SBP 80 3 days ago, so family stopped all of her BP meds with subsequent improvement, no recurrence.  BP now elevated, wants to know what to do next, .  Pt denies polydipsia, polyuria, or low sugar symptoms such as weakness or confusion improved with po intake.  Pt denies new neurological symptoms such as new headache, or facial or extremity weakness or numbness.   Pt states overall good compliance with meds, has been trying to follow lower cholesterol, diabetic diet, with wt overall stable.  Appetitie somewhat less recently. Denies worsening depressive symptoms, suicidal ideation, or panic Past Medical History  Diagnosis Date  . ABDOMINAL PAIN, CHRONIC 04/07/2008  . Abdominal pain, generalized 12/24/2009  . ARTHRITIS 03/03/2008  . BRADYCARDIA, CHRONIC 12/17/2008  . CARDIAC MURMUR 02/01/2010  . CONSTIPATION, CHRONIC 10/15/2010  . DEGENERATIVE JOINT DISEASE, KNEES, BILATERAL 12/07/2007  . DEPRESSION 06/17/2009  . DIABETES MELLITUS, TYPE II 09/19/2007  . DYSPHAGIA UNSPECIFIED 12/24/2009  . FATIGUE 01/27/2010  . FEVER UNSPECIFIED 09/30/2008  . Gastroparesis 12/24/2009  . GENERALIZED OSTEOARTHROSIS INVOLVING HAND 10/03/2007  . GLAUCOMA 09/19/2007  . GOUTY ARTHROPATHY UNSPECIFIED 02/17/2009  . GOUT 09/19/2007  . HYPERLIPIDEMIA 01/27/2010  . HYPERTENSION 09/19/2007  . LUNG NODULE 03/03/2008  . MENOPAUSAL DISORDER 12/17/2008  . PERIPHERAL NEUROPATHY 06/19/2008  . PERSONAL HISTORY MALIGNANT NEOPLASM STOMACH 09/19/2007  . 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 STOMACH CANCER 03/03/2008  . UTI 12/15/2009  . Arthritis of knee, degenerative 10/19/2011   Past Surgical History  Procedure Laterality Date  .  Vagotomy    . Partial gastrectomy    . Billroth ii gastorenterostomy      reports that she has quit smoking. Her smoking use included Cigarettes. She smoked 0.00 packs per day. She has never used smokeless tobacco. She reports that she does not drink alcohol or use illicit drugs. family history includes Cancer in her sister; Colon cancer in her maternal grandmother and paternal grandfather; Diabetes in her other; Stroke in her father; Uterine cancer in her mother. Allergies  Allergen Reactions  . Dulcolax [Bisacodyl]   . Hydrocodone-Acetaminophen     REACTION: Rash  . Penicillins   . Timolol     REACTION: bradycardia worse   Current Outpatient Prescriptions on File Prior to Visit  Medication Sig Dispense Refill  . AMBULATORY NON FORMULARY MEDICATION Domperidone 10 mg Please take one tablet by mouth three times daily before each meal  90 tablet  3  . celecoxib (CELEBREX) 200 MG capsule Take 200 mg by mouth daily.      . cyanocobalamin (,VITAMIN B-12,) 1000 MCG/ML injection Inject 1000 mcg or 1 ml into a muscle once weekly for 3 weeks and then monthly thereafter.  10 mL  1  . glucose blood (FREESTYLE LITE) test strip Use as directed three times daily to check blood sugar.  Diagnosis code 250.00  100 each  11  . HYDROcodone-acetaminophen (NORCO/VICODIN) 5-325 MG per tablet TAKE 1 TABLET BY MOUTH 2 TIMES A DAY AS NEEDED.  60 tablet  0  . latanoprost (XALATAN) 0.005 % ophthalmic solution Place 1 drop into both eyes daily.        Marland Kitchen losartan (COZAAR) 100 MG tablet  TAKE 1 TABLET BY MOUTH ONCE DAILY.  90 tablet  0  . omeprazole (PRILOSEC) 20 MG capsule TAKE 1 CAPSULE BY MOUTH DAILY.  90 capsule  3  . pravastatin (PRAVACHOL) 10 MG tablet TAKE 1 TABLET (10 MG TOTAL) BY MOUTH EVERY EVENING.  90 tablet  3  . tiZANidine (ZANAFLEX) 4 MG tablet Take 1 tablet (4 mg total) by mouth every 6 (six) hours as needed.  60 tablet  2  . allopurinol (ZYLOPRIM) 100 MG tablet Take 1 tablet (100 mg total) by mouth  daily.  30 tablet  11   No current facility-administered medications on file prior to visit.   Review of Systems  Constitutional: Negative for unexpected weight change, or unusual diaphoresis  HENT: Negative for tinnitus.   Eyes: Negative for photophobia and visual disturbance.  Respiratory: Negative for choking and stridor.   Gastrointestinal: Negative for vomiting and blood in stool.  Genitourinary: Negative for hematuria and decreased urine volume.  Musculoskeletal: Negative for acute joint swelling Skin: Negative for color change and wound.  Neurological: Negative for tremors and numbness other than noted  Psychiatric/Behavioral: Negative for decreased concentration or  hyperactivity.       Objective:   Physical Exam BP 172/72  Pulse 67  Temp(Src) 97.5 F (36.4 C) (Oral)  Wt 181 lb 8 oz (82.328 kg)  SpO2 98% VS noted,  Constitutional: Pt appears well-developed and well-nourished.  HENT: Head: NCAT.  Right Ear: External ear normal.  Left Ear: External ear normal.  Eyes: Conjunctivae and EOM are normal. Pupils are equal, round, and reactive to light.  Neck: Normal range of motion. Neck supple.  Cardiovascular: Normal rate and regular rhythm.   Pulmonary/Chest: Effort normal and breath sounds normal.  Abd:  Soft, NT, non-distended, + BS Neurological: Pt is alert. Not confused  Skin: Skin is warm. No erythema.  Psychiatric: Pt behavior is normal.Not depressed affect    Assessment & Plan:

## 2014-01-29 NOTE — Patient Instructions (Signed)
OK to re-start the losartan 100 mg per day  Please hold off (stop) the amlodipine 5 mg per day for now  Please continue all other medications as before, and refills have been done if requested. Please have the pharmacy call with any other refills you may need.  Please return in 1 months, or sooner if needed

## 2014-01-29 NOTE — Progress Notes (Signed)
Pre visit review using our clinic review tool, if applicable. No additional management support is needed unless otherwise documented below in the visit note. 

## 2014-01-31 NOTE — Assessment & Plan Note (Signed)
stable overall by history and exam, recent data reviewed with pt, and pt to continue medical treatment as before,  to f/u any worsening symptoms or concerns Lab Results  Component Value Date   HGBA1C 5.9 11/19/2013

## 2014-01-31 NOTE — Assessment & Plan Note (Signed)
stable overall by history and exam, recent data reviewed with pt, and pt to continue medical treatment as before,  to f/u any worsening symptoms or concerns Lab Results  Component Value Date   WBC 7.8 11/19/2013   HGB 11.6* 11/19/2013   HCT 36.7 11/19/2013   PLT 176.0 11/19/2013   GLUCOSE 89 11/19/2013   CHOL 141 11/19/2013   TRIG 114.0 11/19/2013   HDL 61.20 11/19/2013   LDLDIRECT 124.3 04/09/2010   LDLCALC 57 11/19/2013   ALT 14 11/19/2013   AST 28 11/19/2013   NA 141 11/19/2013   K 4.6 11/19/2013   CL 110 11/19/2013   CREATININE 1.1 11/19/2013   BUN 14 11/19/2013   CO2 24 11/19/2013   TSH 0.68 11/19/2013   HGBA1C 5.9 11/19/2013   MICROALBUR 2.0* 11/19/2013    

## 2014-01-31 NOTE — Assessment & Plan Note (Signed)
Apparently overcontrolled recently, now BP uncontrolled but o/w asymptomatic - Pt denies chest pain, increased sob or doe, wheezing, orthopnea, PND, increased LE swelling, palpitations, dizziness or syncope. OK to cont the losartan 100, but stop the amlodipine asd, cont to monitor BP at home as they do at least once weekly.

## 2014-02-04 ENCOUNTER — Telehealth: Payer: Self-pay

## 2014-02-04 MED ORDER — HYDROCODONE-ACETAMINOPHEN 5-325 MG PO TABS: ORAL_TABLET | ORAL | Status: DC

## 2014-02-04 NOTE — Telephone Encounter (Signed)
Done hardcopy to [facility]bin  

## 2014-02-04 NOTE — Telephone Encounter (Signed)
Called the patient informed to pickup hardcopy at the front desk. 

## 2014-02-04 NOTE — Telephone Encounter (Signed)
The patient called and is hoping to get pain medicine called in.  She states she is still in pain and is need of medication to help with this.    Please advise if patient needs an ov, thanks!

## 2014-02-12 ENCOUNTER — Other Ambulatory Visit: Payer: Self-pay | Admitting: Internal Medicine

## 2014-02-26 ENCOUNTER — Ambulatory Visit (INDEPENDENT_AMBULATORY_CARE_PROVIDER_SITE_OTHER): Payer: Medicare Other | Admitting: Internal Medicine

## 2014-02-26 ENCOUNTER — Encounter: Payer: Self-pay | Admitting: Internal Medicine

## 2014-02-26 VITALS — BP 160/80 | HR 80 | Temp 98.3°F | Ht 66.0 in | Wt 184.5 lb

## 2014-02-26 DIAGNOSIS — F3289 Other specified depressive episodes: Secondary | ICD-10-CM

## 2014-02-26 DIAGNOSIS — I10 Essential (primary) hypertension: Secondary | ICD-10-CM

## 2014-02-26 DIAGNOSIS — R21 Rash and other nonspecific skin eruption: Secondary | ICD-10-CM

## 2014-02-26 DIAGNOSIS — F329 Major depressive disorder, single episode, unspecified: Secondary | ICD-10-CM

## 2014-02-26 MED ORDER — METHYLPREDNISOLONE ACETATE 80 MG/ML IJ SUSP
80.0000 mg | Freq: Once | INTRAMUSCULAR | Status: AC
  Administered 2014-02-26: 80 mg via INTRAMUSCULAR

## 2014-02-26 MED ORDER — TRIAMCINOLONE ACETONIDE 0.1 % EX CREA: 1.0000 "application " | TOPICAL_CREAM | Freq: Two times a day (BID) | CUTANEOUS | Status: DC

## 2014-02-26 MED ORDER — AMLODIPINE BESYLATE 5 MG PO TABS: 5.0000 mg | ORAL_TABLET | Freq: Every day | ORAL | Status: DC

## 2014-02-26 NOTE — Progress Notes (Signed)
Subjective:    Patient ID: Shari Mcmahon, female    DOB: 07-12-1927, 80 y.o.   MRN: [id number]  HPI  Here to f/u; overall doing ok,  Pt denies chest pain, increased sob or doe, wheezing, orthopnea, PND, increased LE swelling, palpitations, dizziness or syncope.  Pt denies polydipsia, polyuria, or low sugar symptoms such as weakness or confusion improved with po intake.  Pt denies new neurological symptoms such as new headache, or facial or extremity weakness or numbness.   Pt states overall good compliance with meds, has been trying to follow lower cholesterol diet, with wt overall stable,  but little exercise however. BP checked twice per month at her living.  Does have itchy rash for 2 wks to legs below the knees  Denies worsening depressive symptoms, suicidal ideation, or panic; Past Medical History  Diagnosis Date  . ABDOMINAL PAIN, CHRONIC 04/07/2008  . Abdominal pain, generalized 12/24/2009  . ARTHRITIS 03/03/2008  . BRADYCARDIA, CHRONIC 12/17/2008  . CARDIAC MURMUR 02/01/2010  . CONSTIPATION, CHRONIC 10/15/2010  . DEGENERATIVE JOINT DISEASE, KNEES, BILATERAL 12/07/2007  . DEPRESSION 06/17/2009  . DIABETES MELLITUS, TYPE II 09/19/2007  . DYSPHAGIA UNSPECIFIED 12/24/2009  . FATIGUE 01/27/2010  . FEVER UNSPECIFIED 09/30/2008  . Gastroparesis 12/24/2009  . GENERALIZED OSTEOARTHROSIS INVOLVING HAND 10/03/2007  . GLAUCOMA 09/19/2007  . GOUTY ARTHROPATHY UNSPECIFIED 02/17/2009  . GOUT 09/19/2007  . HYPERLIPIDEMIA 01/27/2010  . HYPERTENSION 09/19/2007  . LUNG NODULE 03/03/2008  . MENOPAUSAL DISORDER 12/17/2008  . PERIPHERAL NEUROPATHY 06/19/2008  . PERSONAL HISTORY MALIGNANT NEOPLASM STOMACH 09/19/2007  . 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 STOMACH CANCER 03/03/2008  . UTI 12/15/2009  . Arthritis of knee, degenerative 10/19/2011   Past Surgical History  Procedure Laterality Date  . Vagotomy    . Partial gastrectomy    . Billroth ii gastorenterostomy      reports that she has  quit smoking. Her smoking use included Cigarettes. She smoked 0.00 packs per day. She has never used smokeless tobacco. She reports that she does not drink alcohol or use illicit drugs. family history includes Cancer in her sister; Colon cancer in her maternal grandmother and paternal grandfather; Diabetes in her other; Stroke in her father; Uterine cancer in her mother. Allergies  Allergen Reactions  . Dulcolax [Bisacodyl]   . Hydrocodone-Acetaminophen     REACTION: Rash  . Penicillins   . Timolol     REACTION: bradycardia worse   Current Outpatient Prescriptions on File Prior to Visit  Medication Sig Dispense Refill  . AMBULATORY NON FORMULARY MEDICATION Domperidone 10 mg Please take one tablet by mouth three times daily before each meal  90 tablet  3  . celecoxib (CELEBREX) 200 MG capsule Take 200 mg by mouth daily.      . cyanocobalamin (,VITAMIN B-12,) 1000 MCG/ML injection Inject 1000 mcg or 1 ml into a muscle once weekly for 3 weeks and then monthly thereafter.  10 mL  1  . glucose blood (FREESTYLE LITE) test strip Use as directed three times daily to check blood sugar.  Diagnosis code 250.00  100 each  11  . HYDROcodone-acetaminophen (NORCO/VICODIN) 5-325 MG per tablet TAKE 1 TABLET BY MOUTH 2 TIMES A DAY AS NEEDED.  60 tablet  0  . latanoprost (XALATAN) 0.005 % ophthalmic solution Place 1 drop into both eyes daily.        Marland Kitchen losartan (COZAAR) 100 MG tablet TAKE 1 TABLET BY MOUTH ONCE DAILY.  90 tablet  3  .  omeprazole (PRILOSEC) 20 MG capsule TAKE 1 CAPSULE BY MOUTH DAILY.  90 capsule  3  . pravastatin (PRAVACHOL) 10 MG tablet TAKE 1 TABLET BY MOUTH EVERY EVENING.  90 tablet  3  . tiZANidine (ZANAFLEX) 4 MG tablet Take 1 tablet (4 mg total) by mouth every 6 (six) hours as needed.  60 tablet  2  . allopurinol (ZYLOPRIM) 100 MG tablet Take 1 tablet (100 mg total) by mouth daily.  30 tablet  11   No current facility-administered medications on file prior to visit.    Review of  Systems  Constitutional: Negative for unusual diaphoresis or other sweats  HENT: Negative for ringing in ear Eyes: Negative for double vision or worsening visual disturbance.  Respiratory: Negative for choking and stridor.   Gastrointestinal: Negative for vomiting or other signifcant bowel change Genitourinary: Negative for hematuria or decreased urine volume.  Musculoskeletal: Negative for other MSK pain or swelling Skin: Negative for color change and worsening wound.  Neurological: Negative for tremors and numbness other than noted  Psychiatric/Behavioral: Negative for decreased concentration or agitation other than above       Objective:   Physical Exam BP 160/80  Pulse 80  Temp(Src) 98.3 F (36.8 C) (Oral)  Ht 5\' 6"  (1.676 m)  Wt 184 lb 8 oz (83.689 kg)  BMI 29.79 kg/m2  SpO2 95% VS noted,  Constitutional: Pt appears well-developed, well-nourished.  HENT: Head: NCAT.  Right Ear: External ear normal.  Left Ear: External ear normal.  Eyes: . Pupils are equal, round, and reactive to light. Conjunctivae and EOM are normal Neck: Normal range of motion. Neck supple.  Cardiovascular: Normal rate and regular rhythm.   Pulmonary/Chest: Effort normal and breath sounds normal.  Abd:  Soft, NT, ND, + BS Neurological: Pt is alert. Not confused , motor grossly intact Skin: Skin is warm. NT patchy scaly erythem erythema several areas bilat leg Psychiatric: Pt behavior is normal. No agitation. not depressed affect     Assessment & Plan:

## 2014-02-26 NOTE — Progress Notes (Signed)
Pre visit review using our clinic review tool, if applicable. No additional management support is needed unless otherwise documented below in the visit note. 

## 2014-02-26 NOTE — Patient Instructions (Addendum)
Please take all new medication as prescribed - the steroid to the rash areas  You can also use Benadryl cream (OTC) for the most itchy areas  You had the steroid shot today  Please take all new medication as prescribed - the amlodipine 5 mg per day  Please continue all other medications as before, including the losartan 100 mg  Please have the pharmacy call with any other refills you may need.

## 2014-02-27 DIAGNOSIS — R21 Rash and other nonspecific skin eruption: Secondary | ICD-10-CM | POA: Insufficient documentation

## 2014-02-27 NOTE — Assessment & Plan Note (Signed)
Mild uncontrolled, add amlod 5 qd, Please continue all other medications as before

## 2014-02-27 NOTE — Assessment & Plan Note (Signed)
stable overall by history and exam, recent data reviewed with pt, and pt to continue medical treatment as before,  to f/u any worsening symptoms or concerns Lab Results  Component Value Date   WBC 7.8 11/19/2013   HGB 11.6* 11/19/2013   HCT 36.7 11/19/2013   PLT 176.0 11/19/2013   GLUCOSE 89 11/19/2013   CHOL 141 11/19/2013   TRIG 114.0 11/19/2013   HDL 61.20 11/19/2013   LDLDIRECT 124.3 04/09/2010   LDLCALC 57 11/19/2013   ALT 14 11/19/2013   AST 28 11/19/2013   NA 141 11/19/2013   K 4.6 11/19/2013   CL 110 11/19/2013   CREATININE 1.1 11/19/2013   BUN 14 11/19/2013   CO2 24 11/19/2013   TSH 0.68 11/19/2013   HGBA1C 5.9 11/19/2013   MICROALBUR 2.0* 11/19/2013

## 2014-02-27 NOTE — Assessment & Plan Note (Signed)
Mild to mod, for depomedrol IM, triam cr prn,   to f/u any worsening symptoms or concerns

## 2014-03-10 ENCOUNTER — Ambulatory Visit: Payer: Medicare Other | Admitting: Family Medicine

## 2014-03-10 DIAGNOSIS — Z0289 Encounter for other administrative examinations: Secondary | ICD-10-CM

## 2014-04-01 ENCOUNTER — Ambulatory Visit (INDEPENDENT_AMBULATORY_CARE_PROVIDER_SITE_OTHER): Payer: Medicare Other | Admitting: Internal Medicine

## 2014-04-01 ENCOUNTER — Encounter: Payer: Self-pay | Admitting: Internal Medicine

## 2014-04-01 ENCOUNTER — Ambulatory Visit: Payer: Medicare Other | Admitting: Internal Medicine

## 2014-04-01 VITALS — BP 118/60 | Temp 98.3°F | Ht 69.0 in | Wt 184.0 lb

## 2014-04-01 DIAGNOSIS — L988 Other specified disorders of the skin and subcutaneous tissue: Secondary | ICD-10-CM

## 2014-04-01 DIAGNOSIS — L959 Vasculitis limited to the skin, unspecified: Secondary | ICD-10-CM

## 2014-04-01 MED ORDER — HYDROCODONE-ACETAMINOPHEN 5-325 MG PO TABS: ORAL_TABLET | ORAL | Status: DC

## 2014-04-01 NOTE — Progress Notes (Signed)
   Subjective:    Patient ID: Shari Mcmahon, female    DOB: [birth year], 80 y.o.   MRN: [id number]  HPI bruising/rash Pt states the hypopigmentation started approximately 02/24/14. It began on the lower thigh of her L leg. There is no itching or pain associated. The pt was seen by Dr. Gwynn Burly on 02/26/14 at which time he prescribed Kenalog cream BID and gave IM steroid. Since using the cream the pt states there has been no improvement. In fact on 03/24/14 approximately the pt reported the "rash" spread to her lower L leg and L foot around her toes. She also noted it on her R foot.   Pt was started on amlodipine 02/26/14 but states she has been on it in the past. The pt denies any new exposures to soaps, detergents, pets.    Review of Systems  Constitutional: Negative for fever and chills.  HENT: Negative for congestion and sneezing.   Respiratory: Negative for cough and shortness of breath.   Cardiovascular: Negative for chest pain.  Musculoskeletal: Positive for arthralgias and joint swelling.       Pt has history of osteoarthritis   Hematological: Does not bruise/bleed easily.       Objective:   Physical Exam        Assessment & Plan:

## 2014-04-01 NOTE — Progress Notes (Signed)
Pre visit review using our clinic review tool, if applicable. No additional management support is needed unless otherwise documented below in the visit note. 

## 2014-04-01 NOTE — Progress Notes (Signed)
   Subjective:    Patient ID: Shari Mcmahon, female    DOB: 03/01/1927, 80 y.o.   MRN: [id number]  HPI  She describes pigmentation changes beginning approximate 02/24/14; this was initially on the lower thigh of her left leg. It was not associated with pruritus, erythema, or pain.  Dr. Jenny Reichmann prescribed Kenalog cream twice a day and gave her a shot of IM steroid on 4/29. There's been no improvement; in fact she feels that the "rash" is now present in the lower portion of her leg and left foot over the toes. Additionally she now notes lesions on the right foot as well.  She was started on amlodipine that same day. She has taken this medicine in the past without issue    Review of Systems  She has no fever, chills, sweats, weight loss  She has no itchy, watery eyes, sneezing  She has no shortness of breath or wheezing  She does have diffuse arthralgias and joint swelling.     Objective:   Physical Exam  She is in no acute distress; she appears younger than her stated age  She has no evidence of conjunctivitis or scleral icterus  She has no lymphadenopathy about the head, neck, axilla  She has a grade 1.5 systolic murmur at the base  Chest is clear with no increased work of breathing  She has mixed changes in hands involving both the PIP and the DIP joints  Valgus changes of the knees with fusiform changes and marked crepitus  There is markedly decreased circulation in the lower extremities; pedal pulses are not palpable  She has slightly hyperpigmented irregular pigment changes most notable over the left inferior thigh. This is not raised, macerated, or hot  She has plaque-like hyperpigmented changes in irregular distribution over the dorsum of the feet most notable medially @ base of toes.  There are chronic toenail deformities        Assessment & Plan:  #1 dermatitis left inferior thigh; this is suggestive of vasculitis, particularly in the context of her diffuse  polyarthralgia  #2 dermatitis over the dorsum of the feet; contact dermatitis is suggested but her  diabetic shoes state "e 100% leather", not synthetc.  Plan: Dermatology consultation.

## 2014-04-01 NOTE — Patient Instructions (Signed)
The  Dermatology referral will be scheduled and you'll be notified of the time. 

## 2014-04-06 ENCOUNTER — Emergency Department (HOSPITAL_COMMUNITY): Payer: Medicare Other

## 2014-04-06 ENCOUNTER — Emergency Department (HOSPITAL_COMMUNITY)
Admission: EM | Admit: 2014-04-06 | Discharge: 2014-04-06 | Disposition: A | Discharge status: Home / Self Care | Payer: Medicare Other | Attending: Emergency Medicine | Admitting: Emergency Medicine

## 2014-04-06 ENCOUNTER — Encounter (HOSPITAL_COMMUNITY): Payer: Self-pay | Admitting: Emergency Medicine

## 2014-04-06 DIAGNOSIS — F3289 Other specified depressive episodes: Secondary | ICD-10-CM | POA: Insufficient documentation

## 2014-04-06 DIAGNOSIS — M25512 Pain in left shoulder: Secondary | ICD-10-CM

## 2014-04-06 DIAGNOSIS — I498 Other specified cardiac arrhythmias: Secondary | ICD-10-CM | POA: Insufficient documentation

## 2014-04-06 DIAGNOSIS — Z885 Allergy status to narcotic agent status: Secondary | ICD-10-CM | POA: Insufficient documentation

## 2014-04-06 DIAGNOSIS — M25529 Pain in unspecified elbow: Secondary | ICD-10-CM | POA: Insufficient documentation

## 2014-04-06 DIAGNOSIS — R011 Cardiac murmur, unspecified: Secondary | ICD-10-CM | POA: Insufficient documentation

## 2014-04-06 DIAGNOSIS — IMO0002 Reserved for concepts with insufficient information to code with codable children: Secondary | ICD-10-CM | POA: Insufficient documentation

## 2014-04-06 DIAGNOSIS — R1084 Generalized abdominal pain: Secondary | ICD-10-CM | POA: Insufficient documentation

## 2014-04-06 DIAGNOSIS — F329 Major depressive disorder, single episode, unspecified: Secondary | ICD-10-CM | POA: Insufficient documentation

## 2014-04-06 DIAGNOSIS — G622 Polyneuropathy due to other toxic agents: Secondary | ICD-10-CM | POA: Insufficient documentation

## 2014-04-06 DIAGNOSIS — E785 Hyperlipidemia, unspecified: Secondary | ICD-10-CM | POA: Insufficient documentation

## 2014-04-06 DIAGNOSIS — E119 Type 2 diabetes mellitus without complications: Secondary | ICD-10-CM | POA: Insufficient documentation

## 2014-04-06 DIAGNOSIS — Z880 Allergy status to penicillin: Secondary | ICD-10-CM | POA: Insufficient documentation

## 2014-04-06 DIAGNOSIS — R5383 Other fatigue: Secondary | ICD-10-CM

## 2014-04-06 DIAGNOSIS — R5381 Other malaise: Secondary | ICD-10-CM | POA: Insufficient documentation

## 2014-04-06 DIAGNOSIS — K5909 Other constipation: Secondary | ICD-10-CM | POA: Insufficient documentation

## 2014-04-06 DIAGNOSIS — M1990 Unspecified osteoarthritis, unspecified site: Secondary | ICD-10-CM

## 2014-04-06 DIAGNOSIS — Z87891 Personal history of nicotine dependence: Secondary | ICD-10-CM | POA: Insufficient documentation

## 2014-04-06 DIAGNOSIS — J984 Other disorders of lung: Secondary | ICD-10-CM | POA: Insufficient documentation

## 2014-04-06 DIAGNOSIS — M159 Polyosteoarthritis, unspecified: Secondary | ICD-10-CM | POA: Insufficient documentation

## 2014-04-06 DIAGNOSIS — R1310 Dysphagia, unspecified: Secondary | ICD-10-CM | POA: Insufficient documentation

## 2014-04-06 DIAGNOSIS — N959 Unspecified menopausal and perimenopausal disorder: Secondary | ICD-10-CM | POA: Insufficient documentation

## 2014-04-06 DIAGNOSIS — Z79899 Other long term (current) drug therapy: Secondary | ICD-10-CM | POA: Insufficient documentation

## 2014-04-06 DIAGNOSIS — G8929 Other chronic pain: Secondary | ICD-10-CM | POA: Insufficient documentation

## 2014-04-06 DIAGNOSIS — G609 Hereditary and idiopathic neuropathy, unspecified: Secondary | ICD-10-CM | POA: Insufficient documentation

## 2014-04-06 DIAGNOSIS — Z85028 Personal history of other malignant neoplasm of stomach: Secondary | ICD-10-CM | POA: Insufficient documentation

## 2014-04-06 DIAGNOSIS — R509 Fever, unspecified: Secondary | ICD-10-CM | POA: Insufficient documentation

## 2014-04-06 DIAGNOSIS — I10 Essential (primary) hypertension: Secondary | ICD-10-CM | POA: Insufficient documentation

## 2014-04-06 DIAGNOSIS — Z888 Allergy status to other drugs, medicaments and biological substances status: Secondary | ICD-10-CM | POA: Insufficient documentation

## 2014-04-06 DIAGNOSIS — Z87440 Personal history of urinary (tract) infections: Secondary | ICD-10-CM | POA: Insufficient documentation

## 2014-04-06 DIAGNOSIS — M1710 Unilateral primary osteoarthritis, unspecified knee: Secondary | ICD-10-CM | POA: Insufficient documentation

## 2014-04-06 DIAGNOSIS — K3184 Gastroparesis: Secondary | ICD-10-CM | POA: Insufficient documentation

## 2014-04-06 LAB — URIC ACID: Uric Acid, Serum: 4.6 mg/dL (ref 2.4–7.0)

## 2014-04-06 LAB — TROPONIN I: Troponin I: <0.3 ng/mL (ref <0.30)

## 2014-04-06 MED ORDER — FENTANYL CITRATE 0.05 MG/ML IJ SOLN
25.0000 ug | Freq: Once | INTRAMUSCULAR | Status: AC
  Administered 2014-04-06: 25 ug via INTRAVENOUS
  Filled 2014-04-06: qty 2

## 2014-04-06 MED ORDER — HYDROCODONE-ACETAMINOPHEN 5-325 MG PO TABS: 1.0000 | ORAL_TABLET | Freq: Four times a day (QID) | ORAL | Status: DC | PRN

## 2014-04-06 NOTE — Discharge Instructions (Signed)
Please call your doctor for a followup appointment within 24-48 hours. When you talk to your doctor please let them know that you were seen in the emergency department and have them acquire all of your records so that they can discuss the findings with you and formulate a treatment plan to fully care for your new and ongoing problems. Please call and set-up an appointment with your primary care provider to be seen and re-assessed Please call and set-up an appointment with orthopedics regarding your arm pain. Please keep arm in sling when active Please massage and perform with slow circular motions Please take medications as prescribed - while on pain medications there is to be no drinking alcohol, driving, operating any heavy machinery. If extra please dispose in a proper manner. Please do not take any extra Tylenol for this can lead to Tylenol overdose and liver issues.  Please continue to monitor symptoms and if symptoms are to worsen or change (fever greater than 101, chills, sweating, nausea, vomiting, diarrhea, chest pain, shortness of breath, difficulty breathing, worsening or changes to pain pattern, fall, injury, numbness, tingling) please report back to the ED immediately    Arthralgia Arthralgia is joint pain. A joint is a place where two bones meet. Joint pain can happen for many reasons. The joint can be bruised, stiff, infected, or weak from aging. Pain usually goes away after resting and taking medicine for soreness.  HOME CARE  Rest the joint as told by your doctor.  Keep the sore joint raised (elevated) for the first 24 hours.  Put ice on the joint area.  Put ice in a plastic bag.  Place a towel between your skin and the bag.  Leave the ice on for 15-20 minutes, 03-04 times a day.  Wear your splint, casting, elastic bandage, or sling as told by your doctor.  Only take medicine as told by your doctor. Do not take aspirin.  Use crutches as told by your doctor. Do not put  weight on the joint until told to by your doctor. GET HELP RIGHT AWAY IF:   You have bruising, puffiness (swelling), or more pain.  Your fingers or toes turn blue or start to lose feeling (numb).  Your medicine does not lessen the pain.  Your pain becomes severe.  You have a temperature by mouth above 102 F (38.9 C), not controlled by medicine.  You cannot move or use the joint. MAKE SURE YOU:   Understand these instructions.  Will watch your condition.  Will get help right away if you are not doing well or get worse. Document Released: 10/05/2009 Document Revised: 01/09/2012 Document Reviewed: 10/05/2009 [facility] Patient Information 2014 [place], [place].

## 2014-04-06 NOTE — ED Notes (Signed)
Pt requesting something for pain, PA Marissa aware.

## 2014-04-06 NOTE — ED Provider Notes (Signed)
CSN: [id number]     Arrival date & time 04/06/14  1100 History   First MD Initiated Contact with Patient 04/06/14 1111     Chief Complaint  Patient presents with  . Gout  . Shoulder Pain     (Consider location/radiation/quality/duration/timing/severity/associated sxs/prior Treatment) The history is provided by the patient. No language interpreter was used.  Shari Mcmahon is an 80 year old female with past medical history of abdominal pain, arthritis, bradycardia, DJD, gout, diabetes presenting to the ED with left shoulder pain that started yesterday afternoon. Patient reports she's been having intermittent left shoulder pain over the past couple months has been seen by orthopedics. Reported that yesterday the pain got worse. Patient reports that she thinks she is having a "gout flareup." Stated that the pain is localized to the left shoulder and is described as "my arm feels like it is going to fall off." Stated that the pain radiates from the left shoulder down to her fingers. Stated that she's been using Norco as well as colchicine without much relief. Reported that she's seen and assessed by orthopedics, Dr. Tamala Julian, who a month ago called patient and told her that she has rotator cuff injury and no cartilage left in her left shoulder. Patient reports that she is currently undergoing physical therapy. 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 Denied swelling, sweating, fever, chills, chest pain, shortness of breath, difficulty breathing, numbness, tingling, nausea, vomiting, diarrhea, vomiting, neck pain, back pain, jaw pain, weakness, warmth upon palpation. PCP Dr. Jenny Reichmann  Past Medical History  Diagnosis Date  . ABDOMINAL PAIN, CHRONIC 04/07/2008  . Abdominal pain, generalized 12/24/2009  . ARTHRITIS 03/03/2008  . BRADYCARDIA, CHRONIC 12/17/2008  . CARDIAC MURMUR 02/01/2010  . CONSTIPATION,  CHRONIC 10/15/2010  . DEGENERATIVE JOINT DISEASE, KNEES, BILATERAL 12/07/2007  . DEPRESSION 06/17/2009  . DIABETES MELLITUS, TYPE II 09/19/2007  . DYSPHAGIA UNSPECIFIED 12/24/2009  . FATIGUE 01/27/2010  . FEVER UNSPECIFIED 09/30/2008  . Gastroparesis 12/24/2009  . GENERALIZED OSTEOARTHROSIS INVOLVING HAND 10/03/2007  . GLAUCOMA 09/19/2007  . GOUTY ARTHROPATHY UNSPECIFIED 02/17/2009  . GOUT 09/19/2007  . HYPERLIPIDEMIA 01/27/2010  . HYPERTENSION 09/19/2007  . LUNG NODULE 03/03/2008  . MENOPAUSAL DISORDER 12/17/2008  . PERIPHERAL NEUROPATHY 06/19/2008  . PERSONAL HISTORY MALIGNANT NEOPLASM STOMACH 09/19/2007  . 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 STOMACH CANCER 03/03/2008  . UTI 12/15/2009  . Arthritis of knee, degenerative 10/19/2011   Past Surgical History  Procedure Laterality Date  . Vagotomy    . Partial gastrectomy    . Billroth ii gastorenterostomy     Family History  Problem Relation Age of Onset  . Uterine cancer Mother   . Stroke Father     Hemorrhagic  . Cancer Sister     breast cancer and one sister with uterine cancer and one sister with ovarian cancerr  . Colon cancer Maternal Grandmother   . Colon cancer Paternal Grandfather   . Diabetes Other    History  Substance Use Topics  . Smoking status: Former Smoker    Types: Cigarettes  . Smokeless tobacco: Never Used     Comment: use to smoke a pack of cigaretts a day for 25 yrs. but stopped 10 yrs. ago.   . Alcohol Use: No   OB History   Grav Para Term Preterm Abortions TAB SAB Ect Mult Living  Review of Systems  Constitutional: Negative for fever and chills.  Respiratory: Negative for chest tightness and shortness of breath.   Cardiovascular: Negative for chest pain.  Gastrointestinal: Negative for nausea and vomiting.  Musculoskeletal: Positive for arthralgias (Left shoulder). Negative for neck pain and neck stiffness.  Skin: Negative for color change.  Neurological: Negative for  dizziness, weakness and headaches.      Allergies  Dulcolax; Hydrocodone-acetaminophen; Penicillins; and Timolol  Home Medications   Prior to Admission medications   Medication Sig Start Date End Date Taking? Authorizing Provider  amLODipine (NORVASC) 5 MG tablet Take 1 tablet (5 mg total) by mouth daily. 02/26/14 02/26/15 Yes Biagio Borg, MD  colchicine (COLCRYS) 0.6 MG tablet Take 0.6 mg by mouth daily.   Yes Historical Provider, MD  gabapentin (NEURONTIN) 600 MG tablet Take 600 mg by mouth daily.    Yes Historical Provider, MD  glipiZIDE (GLUCOTROL XL) 2.5 MG 24 hr tablet Take 2.5 mg by mouth daily as needed (For high blood sugar).    Yes Historical Provider, MD  HYDROcodone-acetaminophen (NORCO/VICODIN) 5-325 MG per tablet Take 1 tablet by mouth 2 (two) times daily as needed for moderate pain.   Yes Historical Provider, MD  latanoprost (XALATAN) 0.005 % ophthalmic solution Place 1 drop into both eyes daily.     Yes Historical Provider, MD  losartan (COZAAR) 100 MG tablet Take 100 mg by mouth daily.   Yes Historical Provider, MD  omeprazole (PRILOSEC) 20 MG capsule Take 20 mg by mouth daily.   Yes Historical Provider, MD  pravastatin (PRAVACHOL) 10 MG tablet Take 10 mg by mouth every evening.   Yes Historical Provider, MD  tiZANidine (ZANAFLEX) 4 MG tablet Take 1 tablet (4 mg total) by mouth every 6 (six) hours as needed. 04/08/13  Yes Biagio Borg, MD   BP 150/59  Pulse 78  Temp(Src) 98.6 F (37 C) (Oral)  Resp 18  SpO2 94% Physical Exam  Nursing note and vitals reviewed. Constitutional: She is oriented to person, place, and time. She appears well-developed and well-nourished. No distress.  HENT:  Head: Normocephalic and atraumatic.  Mouth/Throat: Oropharynx is clear and moist. No oropharyngeal exudate.  Eyes: Conjunctivae and EOM are normal. Pupils are equal, round, and reactive to light. Right eye exhibits no discharge. Left eye exhibits no discharge.  Neck: Normal range of  motion. Neck supple. No tracheal deviation present.  Negative neck stiffness Negative pain upon palpation to the c-spine  Negative pain upon palpation to the musculature of the neck   Cardiovascular: Normal rate, regular rhythm and normal heart sounds.  Exam reveals no friction rub.   No murmur heard. Pulses:      Radial pulses are 2+ on the right side, and 2+ on the left side.       Dorsalis pedis pulses are 2+ on the right side, and 2+ on the left side.  Cap refill < 3 seconds Negative swelling or pitting edema noted to the lower extremities bilaterally   Pulmonary/Chest: Effort normal and breath sounds normal. No respiratory distress. She has no wheezes. She has no rales.  Patient is able to speak in full sentences without difficulty Negative   Musculoskeletal: She exhibits tenderness. She exhibits no edema.       Left shoulder: She exhibits decreased range of motion, tenderness and bony tenderness. She exhibits no swelling, no effusion, no crepitus, no deformity, no laceration, no pain, no spasm and normal pulse.       Arms:  Negative swelling, erythema, formation, lesions, sores, deformities identified to the left shoulder. Negative warmth upon palpation. Discomfort upon palpation to the anterior posterior aspect of the left shoulder discomfort upon palpation to the acromioclavicular joint and distal aspect of the left clavicle. Discomfort upon palpation to the distal go to her circumferentially. Decreased flexion, extension, abduction, adduction secondary to pain in the left shoulder. Negative abnormalities or deformities identified to left elbow-full range of motion noted. Full range of motion to the left hand and digits of left hand.  Lymphadenopathy:    She has no cervical adenopathy.  Neurological: She is alert and oriented to person, place, and time. No cranial nerve deficit. She exhibits normal muscle tone. Coordination normal.  Cranial nerves III-XII grossly intact Strength 5+/5+ to  upper and lower extremities bilaterally with resistance applied, equal distribution noted Strength intact to MCP, PIP, DIP joints of left hand Sensation intact with differentiation sharp and dull touch Negative arm drift Fine motor skills intact Negative facial drooping Exerted speech Negative aphasia Heel to knee down shin normal bilaterally Gait proper, proper balance - negative sway, negative drift, negative step-offs  Skin: Skin is warm and dry. No rash noted. She is not diaphoretic. No erythema.  Psychiatric: She has a normal mood and affect. Her behavior is normal. Thought content normal.    ED Course  Procedures (including critical care time)  2:39 PM Patient seen and assessed by attending physician, Dr. Cassandria Anger. As per physician cleared patient for discharge. Reported that patient appears well and does not believe this to be a cardiac condition. Recommended patient to be discharged with Norco and follow-up with orthopedics.   Results for orders placed during the hospital encounter of 04/06/14  TROPONIN I      Result Value Ref Range   Troponin I <0.30  <0.30 ng/mL  URIC ACID      Result Value Ref Range   Uric Acid, Serum 4.6  2.4 - 7.0 mg/dL    Labs Review Labs Reviewed  TROPONIN I  URIC ACID    Imaging Review Dg Chest 2 View  04/06/2014   CLINICAL DATA:  Gout and left shoulder pain since yesterday.  EXAM: LEFT SHOULDER - 2+ VIEW; CHEST - 2 VIEW  COMPARISON:  Chest radiograph 02/21/2008. Humerus films of same date.  FINDINGS: Two views of the left shoulder demonstrate advanced acromioclavicular and moderate glenohumeral joint osteoarthritis. No acute fracture or dislocation. No typical findings of gouty arthritis.  PA and lateral views of the chest demonstrate the left arem not to be raised, causing artifact on the lateral view.  High riding right humeral head, consistent with chronic rotator cuff insufficiency. Midline trachea. Moderate cardiomegaly with tortuous thoracic  aorta. No pleural effusion or pneumothorax. Linear scarring in the right mid lung on the frontal radiograph. No congestive failure. Bibasilar subsegmental atelectasis or scar.  IMPRESSION: Degenerative change, without acute finding about the left shoulder.  Cardiomegaly without congestive failure.  Patchy bilateral scarring.   Electronically Signed   By: Abigail Miyamoto M.D.   On: 04/06/2014 13:36   Dg Shoulder Left  04/06/2014   CLINICAL DATA:  Gout and left shoulder pain since yesterday.  EXAM: LEFT SHOULDER - 2+ VIEW; CHEST - 2 VIEW  COMPARISON:  Chest radiograph 02/21/2008. Humerus films of same date.  FINDINGS: Two views of the left shoulder demonstrate advanced acromioclavicular and moderate glenohumeral joint osteoarthritis. No acute fracture or dislocation. No typical findings of gouty arthritis.  PA and lateral views of the  chest demonstrate the left arem not to be raised, causing artifact on the lateral view.  High riding right humeral head, consistent with chronic rotator cuff insufficiency. Midline trachea. Moderate cardiomegaly with tortuous thoracic aorta. No pleural effusion or pneumothorax. Linear scarring in the right mid lung on the frontal radiograph. No congestive failure. Bibasilar subsegmental atelectasis or scar.  IMPRESSION: Degenerative change, without acute finding about the left shoulder.  Cardiomegaly without congestive failure.  Patchy bilateral scarring.   Electronically Signed   By: Abigail Miyamoto M.D.   On: 04/06/2014 13:36   Dg Humerus Left  04/06/2014   CLINICAL DATA:  Left arm pain.  Gout.  EXAM: LEFT HUMERUS - 2+ VIEW  COMPARISON:  None.  FINDINGS: There is no evidence of fracture or other focal bone lesions. Generalized osteopenia noted. Mild degenerative spurring noted at the shoulder and elbow joints. Several phleboliths noted in the soft tissues of the left mid upper arm.  IMPRESSION: No acute findings.   Electronically Signed   By: Earle Gell M.D.   On: 04/06/2014 13:34      EKG Interpretation   Date/Time:  Sunday April 06 2014 12:05:48 EDT Ventricular Rate:  73 PR Interval:  162 QRS Duration: 91 QT Interval:  408 QTC Calculation: 450 R Axis:   25 Text Interpretation:  Sinus rhythm Ventricular premature complex Baseline  wander in lead(s) V6 Confirmed by HORTON  MD, COURTNEY ([id number]) on 04/06/2014  12:09:57 PM      MDM   Final diagnoses:  Osteoarthritis  Left shoulder pain   Medications  fentaNYL (SUBLIMAZE) injection 25 mcg (25 mcg Intravenous Given 04/06/14 1352)   Filed Vitals:   04/06/14 1109 04/06/14 1431  BP: 165/72 150/59  Pulse: 86 78  Temp: 98.7 F (37.1 C) 98.6 F (37 C)  TempSrc: Oral Oral  Resp: 20 18  SpO2: 99% 94%   EKG noted normal sinus rhythm with heart rates into the spermatic-ventricular premature complex identified. Troponin negative elevation. Uric acid negative elevation. Chest x-ray negative acute cardiac pulmonary findings-cardiomegaly identified without congestive heart failure. Patchy bilateral scarring noted. Plain film of left shoulder negative for acute osseous injury-degenerative changes noted. Left humerus plain film negative for acute osseous injury. Patient seen and assessed by attending physician, Dr. Cassandria Anger, who cleared patient for discharge. Recommended patient to be discharged home with pain medications.  Plain film of the left shoulder noted advanced acromioclavicular and moderate glenohumeral joint osteoarthritis.  Doubt gouty arthritis. Doubt septic joint. Doubt cardiac issue. Plain film is unremarkable-findings for osteoarthritis. Pain medications administered in ED setting-pain control. Patient placed in sling for comfort. Patient stable, afebrile. Patient not septic appearing. Discharged patient. Referred patient to primary care provider and orthopedics. Discussed with patient to rest and stay hydrated. Discussed with patient to avoid any physical or shortness activity. Discussed with patient to  closely monitor symptoms and if symptoms are to worsen or change to report back to the ED - strict return instructions given.  Patient agreed to plan of care, understood, all questions answered.    Jamse Mead, PA-C 04/06/14 1711

## 2014-04-06 NOTE — ED Notes (Signed)
Pt ambulated to BR with assistance.

## 2014-04-06 NOTE — ED Notes (Signed)
Pt presents to ed with c/o gout/shoulder pain. Pt sts she has "severe case of gout in left shoulder" but has been able to control the pain for years. Reports pain started yesterday and is 10/10.

## 2014-04-06 NOTE — ED Notes (Addendum)
Pt reports chronic pain and gout in L shoulder. Pt has been to PT and had injections for the same. Pt adds that she was told the "rotator cuff and cartilage is gone." Pt is A&O, ambulates with little assistance and in NAD. Pt denies SOB or CP

## 2014-04-07 ENCOUNTER — Ambulatory Visit (INDEPENDENT_AMBULATORY_CARE_PROVIDER_SITE_OTHER): Payer: Medicare Other | Admitting: Family Medicine

## 2014-04-07 ENCOUNTER — Encounter: Payer: Self-pay | Admitting: Family Medicine

## 2014-04-07 ENCOUNTER — Other Ambulatory Visit (INDEPENDENT_AMBULATORY_CARE_PROVIDER_SITE_OTHER): Payer: Medicare Other

## 2014-04-07 VITALS — BP 134/62 | HR 64 | Ht 66.0 in | Wt 181.0 lb

## 2014-04-07 DIAGNOSIS — M1710 Unilateral primary osteoarthritis, unspecified knee: Secondary | ICD-10-CM

## 2014-04-07 DIAGNOSIS — M25512 Pain in left shoulder: Secondary | ICD-10-CM

## 2014-04-07 DIAGNOSIS — M12812 Other specific arthropathies, not elsewhere classified, left shoulder: Secondary | ICD-10-CM

## 2014-04-07 DIAGNOSIS — M12811 Other specific arthropathies, not elsewhere classified, right shoulder: Secondary | ICD-10-CM

## 2014-04-07 DIAGNOSIS — M19019 Primary osteoarthritis, unspecified shoulder: Secondary | ICD-10-CM

## 2014-04-07 DIAGNOSIS — M25519 Pain in unspecified shoulder: Secondary | ICD-10-CM

## 2014-04-07 DIAGNOSIS — M75102 Unspecified rotator cuff tear or rupture of left shoulder, not specified as traumatic: Secondary | ICD-10-CM

## 2014-04-07 DIAGNOSIS — M75101 Unspecified rotator cuff tear or rupture of right shoulder, not specified as traumatic: Secondary | ICD-10-CM

## 2014-04-07 NOTE — Assessment & Plan Note (Signed)
Patient was injected today and was given a brace for her knee. We discussed icing protocol that could be beneficial. We discussed radiation of pain or any type of numbness to pick her to come back sooner. Patient would be a candidate for viscous supplementation. Patient has done formal physical therapy in the past. Patient was given a brace was fitted by me as well they can be helpful. Patient will come back again in 3-4 weeks for further evaluation and treatment.  Spent greater than 25 minutes with patient face-to-face and had greater than 50% of counseling including as described above in assessment and plan.

## 2014-04-07 NOTE — Progress Notes (Signed)
[name] [place],  [id number] Phone: [phone] Subjective:      CC: bilateral shoulder pain left greater than right Left knee pain.   SWN:[id number] Shari Mcmahon is a 80 y.o. female coming in with complaint of bilateral shoulder pain. Worse with left shoulder. Patient was seen in the emergency department and did have x-rays. Patient's x-ray showed no changes and only the osteoarthritic changes within previously. Patient states that the pain is so severe that it is waking her up at night. Denies any radiation but states that she has significant weakness. Patient has no severe rotator cuff arthropathy. Patient did have an injection multiple months ago the did give her multiple months of improvement.  She is also describing left knee pain. 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 Patient does have an injection 3 months ago with near complete resolution of pain. Patient was to go to physical therapy and never did and was to get a brace which she never did as well. Patient states that she's ambulating with a cane. Patient would like further treatment options     Past medical history, social, surgical and family history all reviewed in electronic medical record.   Review of Systems: No headache, visual changes, nausea, vomiting, diarrhea, constipation, dizziness, abdominal pain, skin rash, fevers, chills, night sweats, weight loss, swollen lymph nodes, body aches, joint swelling, muscle aches, chest pain, shortness of breath, mood changes.   Objective Blood pressure 134/62, pulse 64, height 5\' 6"  (1.676 m), weight 181 lb (82.101 kg), SpO2 95.00%.  General: No apparent distress alert and oriented x3 mood and affect normal, dressed appropriately.  HEENT: Pupils equal, extraocular movements intact  Respiratory: Patient's speak in full sentences and does not appear short of breath  Cardiovascular: No lower extremity  edema, non tender, no erythema  Skin: Warm dry intact with no signs of infection or rash on extremities or on axial skeleton.  Abdomen: Soft nontender  Neuro: Cranial nerves II through XII are intact, neurovascularly intact in all extremities with 2+ DTRs and 2+ pulses.  Lymph: No lymphadenopathy of posterior or anterior cervical chain or axillae bilaterally.  Gait normal with good balance and coordination.  MSK: Non tender with full range of motion and good stability and symmetric strength and tone of elbows, wrist, knee and ankles bilaterally. Significant osteoarthritic changes in multiple joints. Shoulder: left Inspection reveals no abnormalities, atrophy or asymmetry. Palpation is normal with no tenderness over AC joint or bicipital groove. Patient does have diffuse tenderness to the shoulder joint bilaterally ROM is decreased actively to forward flexion of 60 abduction to 40 and internal rotation to and lateral head. Passive range of motion does have forward flexion to 120 but significant crepitus causes patient pain. Other testing was not done secondary to herniated patient. Rotator cuff strength is 3/5 bilaterally signs of impingement with positive Neer and Hawkin's tests, empty can sign. Speeds and Yergason's tests normal. No labral pathology noted with negative Obrien's, negative clunk and good stability. Normal scapular function observed. No painful arc and no drop arm sign. No apprehension sign  Knee: Left On standing patient does have a valgus deformity of the left knee compared to the right knee. The patient shows moderate joint with tenderness of the medial aspect. ROM full in flexion and extension and lower leg rotation. Ligaments with solid consistent endpoints including ACL, PCL, LCL, MCL.  painful patellar compression. Patellar glide with severe crepitus. Patellar and  quadriceps tendons unremarkable. Hamstring and quadriceps strength is normal.  Contralateral knee has  some tenderness to palpation over the medial joint line as well as mild decreased range of motion.  Procedure: Real-time Ultrasound Guided Injection of left glenohumeral joint Device: GE Logiq E  Ultrasound guided injection is preferred based studies that show increased duration, increased effect, greater accuracy, decreased procedural pain, increased response rate with ultrasound guided versus blind injection.  Verbal informed consent obtained.  Time-out conducted.  Noted no overlying erythema, induration, or other signs of local infection.  Skin prepped in a sterile fashion.  Local anesthesia: Topical Ethyl chloride.  With sterile technique and under real time ultrasound guidance:  Joint visualized.  23g 1  inch needle inserted posterior approach. Pictures taken for needle placement. Patient did have injection of 2 cc of 1% lidocaine, 2 cc of 0.5% Marcaine, and 1cc of Kenalog 40 mg/dL. Completed without difficulty  Pain immediately resolved suggesting accurate placement of the medication.  Advised to call if fevers/chills, erythema, induration, drainage, or persistent bleeding.  Images permanently stored and available for review in the ultrasound unit.  Impression: Technically successful ultrasound guided injection.  Procedure: Real-time Ultrasound Guided Injection of left knee Device: GE Logiq E  Ultrasound guided injection is preferred based studies that show increased duration, increased effect, greater accuracy, decreased procedural pain, increased response rate, and decreased cost with ultrasound guided versus blind injection.  Verbal informed consent obtained.  Time-out conducted.  Noted no overlying erythema, induration, or other signs of local infection.  Skin prepped in a sterile fashion.  Local anesthesia: Topical Ethyl chloride.  With sterile technique and under real time ultrasound guidance: With a 22-gauge 2 inch needle patient was injected with 4 cc of 0.5% Marcaine and 1 cc  of Kenalog 40 mg/dL. This was from a superior lateral approach.  Completed without difficulty  Pain immediately resolved suggesting accurate placement of the medication.  Advised to call if fevers/chills, erythema, induration, drainage, or persistent bleeding.  Images permanently stored and available for review in the ultrasound unit.  Impression: Technically successful ultrasound guided injection.   Impression and Recommendations:     This case required medical decision making of moderate complexity.

## 2014-04-07 NOTE — Assessment & Plan Note (Signed)
Patient was given another injection today in her left shoulder. Patient tolerated the procedure well with good resolution pain. Patient does have full rotator cuff tear is not a surgical candidate. Patient will continue to respond well to conservative therapy. We discussed the possibility of formal physical therapy which patient declined. Patient will come back again in 3-4 weeks for further evaluation.

## 2014-04-07 NOTE — Patient Instructions (Signed)
Good to see you Injection in knee and shoulders Exercises 3 times a week Ice is your friend Wear brace daily but not at night.  See me again in 1 month to make sure you are doing well

## 2014-04-07 NOTE — ED Provider Notes (Signed)
Medical screening examination/treatment/procedure(s) were conducted as a shared visit with non-physician practitioner(s) and myself.  I personally evaluated the patient during the encounter.   EKG Interpretation   Date/Time:  Sunday April 06 2014 12:05:48 EDT Ventricular Rate:  73 PR Interval:  162 QRS Duration: 91 QT Interval:  408 QTC Calculation: 450 R Axis:   25 Text Interpretation:  Sinus rhythm Ventricular premature complex Baseline  wander in lead(s) V6 Confirmed by Natasia Sanko  MD, Loma Sousa ([id number]) on 04/06/2014  12:09:57 PM      Patient is an 80 year old female with history of arthritis gout who presents with left shoulder pain. Pain is worse with range of motion. She reports worsening pain in the last several months and has been seen by orthopedist and diagnosed with rotator cuff tear.  She states that her hydrocodone at home is not helping. She denies any chest pain or shortness of breath. Suspect musculoskeletal etiology of shoulder pain given progression of pain and history of rotator cuff tear. Full evaluation given patient's age was initiated and is reassuring. Will increase patient's pain medication. Discussed with patient precautions regarding increased pain medication given her age.  After history, exam, and medical workup I feel the patient has been appropriately medically screened and is safe for discharge home. Pertinent diagnoses were discussed with the patient. Patient was given return precautions.   Merryl Hacker, MD 04/07/14 [phone]

## 2014-04-16 ENCOUNTER — Other Ambulatory Visit: Payer: Self-pay | Admitting: Internal Medicine

## 2014-04-16 ENCOUNTER — Telehealth: Payer: Self-pay | Admitting: Internal Medicine

## 2014-04-16 DIAGNOSIS — L309 Dermatitis, unspecified: Secondary | ICD-10-CM

## 2014-04-16 DIAGNOSIS — L988 Other specified disorders of the skin and subcutaneous tissue: Secondary | ICD-10-CM

## 2014-04-16 NOTE — Telephone Encounter (Signed)
Pt called to inquire about her dermatology referral.  There isn't one set up.  Is she to be referred to dermatology?

## 2014-04-16 NOTE — Telephone Encounter (Signed)
Sorry; order entered

## 2014-04-17 NOTE — Telephone Encounter (Signed)
Patient states that [facility] called her with an appointment date of July 10th at 3pm. Patient says that she cannot wait this long and called Allyson Sabal to see if they can work her in sooner. Per patient, [facility] would need our office to send over the referral requesting that she be seen urgently. Please advise.

## 2014-04-17 NOTE — Telephone Encounter (Signed)
Order has been entered. I called and spoke to [facility] and was advised patient's appt has been moved up to June 25 at 3:10 pm. Patient still feels she needs to be seen sooner. appt has been moved to Tuesday June 23 patient aware to be there at 11:00 and she is a work in so she will be waiting

## 2014-04-26 ENCOUNTER — Other Ambulatory Visit: Payer: Self-pay | Admitting: Internal Medicine

## 2014-04-28 NOTE — Telephone Encounter (Signed)
Pt left msg on triage stating she is needing refill on her testing strips. Called pt back inform her we will send to [facility] they had sent a request.../lmb

## 2014-04-29 MED ORDER — GLUCOSE BLOOD VI STRP: ORAL_STRIP | Status: DC

## 2014-04-29 NOTE — Addendum Note (Signed)
Addended by: Sharon Seller B on: 04/29/2014 07:57 AM   Modules accepted: Orders

## 2014-05-07 ENCOUNTER — Ambulatory Visit (INDEPENDENT_AMBULATORY_CARE_PROVIDER_SITE_OTHER): Payer: Medicare Other | Admitting: Family Medicine

## 2014-05-07 ENCOUNTER — Encounter: Payer: Self-pay | Admitting: Family Medicine

## 2014-05-07 VITALS — BP 142/64 | HR 63 | Ht 66.0 in | Wt 186.0 lb

## 2014-05-07 DIAGNOSIS — M1710 Unilateral primary osteoarthritis, unspecified knee: Secondary | ICD-10-CM

## 2014-05-07 NOTE — Patient Instructions (Signed)
It is good to see you.  We start synvisc today.  We will have you come back in 2 weeks for the second injection in each knees.  Continue the exercises  Be careful with the heating pad.  Only use 10 minutes at a time.  See you in 2 weeks.

## 2014-05-07 NOTE — Progress Notes (Signed)
  [name] [address] [place], [place] [address] Phone: [phone] Subjective:      CC: Shoulder and knee pain  [id number] Shari Mcmahon is a 80 y.o. female coming in for followup of left shoulder and left knee pain. Patient did have steroid injections in both of these last time. Patient does have endstage osteoarthritis of both of these joints. Patient states that unfortunately her knee pain has continued. Patient feels that the shoulders has improved slightly.  Patient states that the knees did not make any significant improvement after corticosteroid injection. Patient would like to elect to start Synvisc supplementation. Patient has failed all other conservative therapy including formal physical therapy, icing, home exercises, and oral anti-inflammatories. Patient is also failed cortisone injections.       Past medical history, social, surgical and family history all reviewed in electronic medical record.   Review of Systems: No headache, visual changes, nausea, vomiting, diarrhea, constipation, dizziness, abdominal pain, skin rash, fevers, chills, night sweats, weight loss, swollen lymph nodes, body aches, joint swelling, muscle aches, chest pain, shortness of breath, mood changes.   Objective Blood pressure 142/64, pulse 63, height 5\' 6"  (1.676 m), weight 186 lb (84.369 kg), SpO2 94.00%.  General: No apparent distress alert and oriented x3 mood and affect normal, dressed appropriately.  HEENT: Pupils equal, extraocular movements intact  Respiratory: Patient's speak in full sentences and does not appear short of breath  Cardiovascular: No lower extremity edema, non tender, no erythema  Skin: Warm dry intact with no signs of infection or rash on extremities or on axial skeleton.  Abdomen: Soft nontender  Neuro: Cranial nerves II through XII are intact, neurovascularly intact in all extremities with 2+ DTRs and 2+ pulses.  Lymph: No  lymphadenopathy of posterior or anterior cervical chain or axillae bilaterally.  Gait normal with good balance and coordination.  MSK: Non tender with full range of motion and good stability and symmetric strength and tone of elbows, wrist, knee and ankles bilaterally. Significant osteoarthritic changes in multiple joints.   Knee: Bilateral On standing patient does have a valgus deformity of the left knee compared to the right knee. The patient shows moderate joint with tenderness of the medial aspect. ROM full in flexion and extension and lower leg rotation. Ligaments with solid consistent endpoints including ACL, PCL, LCL, MCL.  painful patellar compression. Patellar glide with severe crepitus. Patellar and quadriceps tendons unremarkable. Hamstring and quadriceps strength is normal.  Contralateral knee has some tenderness to palpation over the medial joint line as well as mild decreased range of motion.  After informed written and verbal consent, patient was seated on exam table. Right knee was prepped with alcohol swab and utilizing anterolateral approach, patient's right knee space was injected with.16 mg/2.5 mL of Synvisc (sodium hyaluronate) in a prefilled syringe was injected easily into the knee through a 22-gauge needle . Patient tolerated the procedure well without immediate complications.  After informed written and verbal consent, patient was seated on exam table. Left knee was prepped with alcohol swab and utilizing anterolateral approach, patient's left knee space was injected with.16 mg/2.5 mL of Synvisc (sodium hyaluronate) in a prefilled syringe was injected easily into the knee through a 22-gauge needle . Patient tolerated the procedure well without immediate complications.   Impression and Recommendations:     This case required medical decision making of moderate complexity.

## 2014-05-07 NOTE — Assessment & Plan Note (Signed)
Bilateral Synvisc injections were started today. We discussed continuing the icing as well as trying to brace the patient is not wearing regularly. We discussed the possibility of another round of physical therapy which patient declined. Patient will continue with home exercises. Patient will come back again in one week for second in a series of 3 injections.

## 2014-05-22 ENCOUNTER — Other Ambulatory Visit: Payer: Self-pay

## 2014-05-22 ENCOUNTER — Ambulatory Visit (INDEPENDENT_AMBULATORY_CARE_PROVIDER_SITE_OTHER): Payer: Medicare Other | Admitting: Family Medicine

## 2014-05-22 ENCOUNTER — Encounter: Payer: Self-pay | Admitting: Family Medicine

## 2014-05-22 VITALS — BP 140/70 | HR 56 | Ht 66.0 in | Wt 183.0 lb

## 2014-05-22 DIAGNOSIS — M1710 Unilateral primary osteoarthritis, unspecified knee: Secondary | ICD-10-CM

## 2014-05-22 NOTE — Assessment & Plan Note (Signed)
synvisc #2 bilaterally today. Discuss continuing the exercises, bracing, and continue ambulation with a cane. Patient will come back in one week and have her third and final injection of Synvisc at that time.

## 2014-05-22 NOTE — Telephone Encounter (Signed)
Pt is seeing Dr Tamala Julian for arthritis today  Hold on hydrocodone rx for now

## 2014-05-22 NOTE — Progress Notes (Signed)
  [facility] [place], [place] [id number] Phone: [phone] Subjective:      CC:  Bilateral knee pain followup  [id number] Shari Mcmahon is a 80 y.o. female coming in for followup for bilateral knee pain. Patient does have osteophytic changes and has failed conservative therapy. Patient was given Synvisc #1 injections previously. Patient states she did not get any significant improvement from those injections. Patient denies any side effects. Patient continues to have really with a cane.      Past medical history, social, surgical and family history all reviewed in electronic medical record.   Review of Systems: No headache, visual changes, nausea, vomiting, diarrhea, constipation, dizziness, abdominal pain, skin rash, fevers, chills, night sweats, weight loss, swollen lymph nodes, body aches, joint swelling, muscle aches, chest pain, shortness of breath, mood changes.   Objective Blood pressure 140/70, pulse 56, height 5\' 6"  (1.676 m), weight 183 lb (83.008 kg), SpO2 93.00%.  General: No apparent distress alert and oriented x3 mood and affect normal, dressed appropriately.  HEENT: Pupils equal, extraocular movements intact  Respiratory: Patient's speak in full sentences and does not appear short of breath  Cardiovascular: No lower extremity edema, non tender, no erythema  Skin: Warm dry intact with no signs of infection or rash on extremities or on axial skeleton.  Abdomen: Soft nontender  Neuro: Cranial nerves II through XII are intact, neurovascularly intact in all extremities with 2+ DTRs and 2+ pulses.  Lymph: No lymphadenopathy of posterior or anterior cervical chain or axillae bilaterally.  Gait normal with good balance and coordination.  MSK: Non tender with full range of motion and good stability and symmetric strength and tone of elbows, wrist, knee and ankles bilaterally. Significant osteoarthritic changes in multiple  joints.   Knee: Bilateral On standing patient does have a valgus deformity of the left knee compared to the right knee. The patient shows moderate joint with tenderness of the medial aspect. ROM full in flexion and extension and lower leg rotation. Ligaments with solid consistent endpoints including ACL, PCL, LCL, MCL.  painful patellar compression. Patellar glide with severe crepitus. Patellar and quadriceps tendons unremarkable. Hamstring and quadriceps strength is normal.  Contralateral knee has some tenderness to palpation over the medial joint line as well as mild decreased range of motion.  After informed written and verbal consent, patient was seated on exam table. Right knee was prepped with alcohol swab and utilizing anterolateral approach, patient's right knee space was injected with.16 mg/2.5 mL of Synvisc (sodium hyaluronate) in a prefilled syringe was injected easily into the knee through a 22-gauge needle . Patient tolerated the procedure well without immediate complications.  After informed written and verbal consent, patient was seated on exam table. Left knee was prepped with alcohol swab and utilizing anterolateral approach, patient's left knee space was injected with.16 mg/2.5 mL of Synvisc (sodium hyaluronate) in a prefilled syringe was injected easily into the knee through a 22-gauge needle . Patient tolerated the procedure well without immediate complications.   Impression and Recommendations:     This case required medical decision making of moderate complexity.

## 2014-05-22 NOTE — Patient Instructions (Signed)
Good to see you Keep up what you are doing.  See you in 1 week.

## 2014-05-23 ENCOUNTER — Other Ambulatory Visit: Payer: Self-pay | Admitting: Internal Medicine

## 2014-05-23 ENCOUNTER — Ambulatory Visit: Payer: Medicare Other | Admitting: Internal Medicine

## 2014-05-23 MED ORDER — HYDROCODONE-ACETAMINOPHEN 5-325 MG PO TABS: 1.0000 | ORAL_TABLET | Freq: Four times a day (QID) | ORAL | Status: DC | PRN

## 2014-05-23 NOTE — Telephone Encounter (Signed)
Pt called to check up on this med. Please advise.

## 2014-05-23 NOTE — Telephone Encounter (Signed)
Called pt no answer [facility] rx ready for pick-up.../lmb 

## 2014-05-23 NOTE — Telephone Encounter (Signed)
Patient would like refill on hydrocodone.  She inquired about it yesterday while in with Dr. Tamala Julian but I do not see anything done yet.  Please advise.  Thanks!

## 2014-05-23 NOTE — Telephone Encounter (Signed)
Done hardcopy to [facility]bin  

## 2014-05-27 ENCOUNTER — Telehealth: Payer: Self-pay | Admitting: *Deleted

## 2014-05-27 DIAGNOSIS — G894 Chronic pain syndrome: Secondary | ICD-10-CM

## 2014-05-27 NOTE — Telephone Encounter (Signed)
Pt stated md only gave her #7 tablets on her hydrocodone. Pt states is md trying to wean her off because she need to take something for pain everyday. With her rotator cuff, neurotherapy, and rheumatoid arthritis she need something for pain...Shari Mcmahon

## 2014-05-27 NOTE — Telephone Encounter (Signed)
i do not treat chronic pain in my practice anymore that involves this type of medicaiton.  I can refer to a pain clinic if she wants to do this. thanks

## 2014-05-27 NOTE — Telephone Encounter (Signed)
Patient informed of MD instructions and she would like a referral to pain clinic.

## 2014-05-29 ENCOUNTER — Encounter: Payer: Self-pay | Admitting: Family Medicine

## 2014-05-29 ENCOUNTER — Ambulatory Visit (INDEPENDENT_AMBULATORY_CARE_PROVIDER_SITE_OTHER): Payer: Medicare Other | Admitting: Family Medicine

## 2014-05-29 ENCOUNTER — Ambulatory Visit: Payer: Medicare Other | Admitting: Family Medicine

## 2014-05-29 VITALS — BP 150/78 | HR 62 | Temp 98.1°F | Wt 183.5 lb

## 2014-05-29 DIAGNOSIS — M1710 Unilateral primary osteoarthritis, unspecified knee: Secondary | ICD-10-CM

## 2014-05-29 NOTE — Patient Instructions (Signed)
You are doing great.  Try the pennsaid twice daily on shoulders Continue everything you are doing for your knees.  You get a break from me for a little bit.   Come back in 4 weeks to check in and we can inject your shoulders again if needed.

## 2014-05-29 NOTE — Progress Notes (Signed)
Pre visit review using our clinic review tool, if applicable. No additional management support is needed unless otherwise documented below in the visit note. 

## 2014-05-29 NOTE — Progress Notes (Signed)
  [name] [address] [place], [place] [address] Phone: [phone] Subjective:      CC:  Bilateral knee pain followup  [id number] Shari Mcmahon is a 80 y.o. female coming in for followup for bilateral knee pain. Patient does have osteophytic changes and has failed conservative therapy. Patient was given Synvisc #2 injections previously. Patient denies any side effects. Patient continues to have to rely on the cane. His and his noticed though that her legs do not feel as heavy and she has not is tired after her exercise classes. Patient thinks that this may be improving some of the pain.       Past medical history, social, surgical and family history all reviewed in electronic medical record.   Review of Systems: No headache, visual changes, nausea, vomiting, diarrhea, constipation, dizziness, abdominal pain, skin rash, fevers, chills, night sweats, weight loss, swollen lymph nodes, body aches, joint swelling, muscle aches, chest pain, shortness of breath, mood changes.   Objective Blood pressure 150/78, pulse 62, temperature 98.1 F (36.7 C), temperature source Oral, weight 183 lb 8 oz (83.235 kg), SpO2 95.00%.  General: No apparent distress alert and oriented x3 mood and affect normal, dressed appropriately.  HEENT: Pupils equal, extraocular movements intact  Respiratory: Patient's speak in full sentences and does not appear short of breath  Cardiovascular: No lower extremity edema, non tender, no erythema  Skin: Warm dry intact with no signs of infection or rash on extremities or on axial skeleton.  Abdomen: Soft nontender  Neuro: Cranial nerves II through XII are intact, neurovascularly intact in all extremities with 2+ DTRs and 2+ pulses.  Lymph: No lymphadenopathy of posterior or anterior cervical chain or axillae bilaterally.  Gait normal with good balance and coordination.  MSK: Non tender with full range of motion and good stability and  symmetric strength and tone of elbows, wrist, knee and ankles bilaterally. Significant osteoarthritic changes in multiple joints.   Knee: Bilateral On standing patient does have a valgus deformity of the left knee compared to the right knee. The patient shows moderate joint with tenderness of the medial aspect. ROM full in flexion and extension and lower leg rotation. Ligaments with solid consistent endpoints including ACL, PCL, LCL, MCL.  painful patellar compression. Patellar glide with severe crepitus. Patellar and quadriceps tendons unremarkable. Hamstring and quadriceps strength is normal.  Contralateral knee has some tenderness to palpation over the medial joint line as well as mild decreased range of motion.  After informed written and verbal consent, patient was seated on exam table. Right knee was prepped with alcohol swab and utilizing anterolateral approach, patient's right knee space was injected with.16 mg/2.5 mL of Synvisc (sodium hyaluronate) in a prefilled syringe was injected easily into the knee through a 22-gauge needle . Patient tolerated the procedure well without immediate complications.  After informed written and verbal consent, patient was seated on exam table. Left knee was prepped with alcohol swab and utilizing anterolateral approach, patient's left knee space was injected with.16 mg/2.5 mL of Synvisc (sodium hyaluronate) in a prefilled syringe was injected easily into the knee through a 22-gauge needle . Patient tolerated the procedure well without immediate complications.   Impression and Recommendations:     This case required medical decision making of moderate complexity.

## 2014-05-29 NOTE — Assessment & Plan Note (Signed)
Patient did have third and final Synvisc injection in the knees bilaterally today. Patient tolerated the procedure well with good numbing benefit from the Marcaine today. Patient encouraged to continue the bracing, exercises, and icing protocol. Patient will continue to ambulate with the aid of a cane. Patient come back again in 4 weeks for further evaluation and treatment.

## 2014-06-10 ENCOUNTER — Ambulatory Visit (INDEPENDENT_AMBULATORY_CARE_PROVIDER_SITE_OTHER): Payer: Medicare Other | Admitting: Internal Medicine

## 2014-06-10 ENCOUNTER — Encounter: Payer: Self-pay | Admitting: Internal Medicine

## 2014-06-10 ENCOUNTER — Other Ambulatory Visit (INDEPENDENT_AMBULATORY_CARE_PROVIDER_SITE_OTHER): Payer: Medicare Other

## 2014-06-10 VITALS — BP 130/74 | HR 58 | Temp 98.2°F | Ht 66.0 in | Wt 183.2 lb

## 2014-06-10 DIAGNOSIS — G894 Chronic pain syndrome: Secondary | ICD-10-CM

## 2014-06-10 DIAGNOSIS — Z0000 Encounter for general adult medical examination without abnormal findings: Secondary | ICD-10-CM

## 2014-06-10 DIAGNOSIS — G609 Hereditary and idiopathic neuropathy, unspecified: Secondary | ICD-10-CM

## 2014-06-10 DIAGNOSIS — C169 Malignant neoplasm of stomach, unspecified: Secondary | ICD-10-CM

## 2014-06-10 DIAGNOSIS — E119 Type 2 diabetes mellitus without complications: Secondary | ICD-10-CM

## 2014-06-10 DIAGNOSIS — E785 Hyperlipidemia, unspecified: Secondary | ICD-10-CM

## 2014-06-10 DIAGNOSIS — I10 Essential (primary) hypertension: Secondary | ICD-10-CM

## 2014-06-10 LAB — LIPID PANEL
CHOLESTEROL: 154 mg/dL (ref 0–200)
HDL: 57.6 mg/dL (ref >39.00)
LDL CALC: 76 mg/dL (ref 0–99)
NonHDL: 96.4
TRIGLYCERIDES: 104 mg/dL (ref 0.0–149.0)
Total CHOL/HDL Ratio: 3
VLDL: 20.8 mg/dL (ref 0.0–40.0)

## 2014-06-10 LAB — HEPATIC FUNCTION PANEL
ALT: 13 U/L (ref 0–35)
AST: 23 U/L (ref 0–37)
Albumin: 3.2 g/dL — ABNORMAL LOW (ref 3.5–5.2)
Alkaline Phosphatase: 81 U/L (ref 39–117)
BILIRUBIN TOTAL: 0.2 mg/dL (ref 0.2–1.2)
Bilirubin, Direct: 0.1 mg/dL (ref 0.0–0.3)
Total Protein: 7.6 g/dL (ref 6.0–8.3)

## 2014-06-10 LAB — BASIC METABOLIC PANEL
BUN: 19 mg/dL (ref 6–23)
CALCIUM: 9.3 mg/dL (ref 8.4–10.5)
CHLORIDE: 108 meq/L (ref 96–112)
CO2: 25 mEq/L (ref 19–32)
CREATININE: 1.2 mg/dL (ref 0.4–1.2)
GFR: 54.32 mL/min — ABNORMAL LOW (ref >60.00)
Glucose, Bld: 96 mg/dL (ref 70–99)
Potassium: 4.8 mEq/L (ref 3.5–5.1)
SODIUM: 140 meq/L (ref 135–145)

## 2014-06-10 LAB — HEMOGLOBIN A1C: HEMOGLOBIN A1C: 6 % (ref 4.6–6.5)

## 2014-06-10 MED ORDER — TRAMADOL HCL 50 MG PO TABS: 50.0000 mg | ORAL_TABLET | Freq: Four times a day (QID) | ORAL | Status: DC | PRN

## 2014-06-10 MED ORDER — DULOXETINE HCL 30 MG PO CPEP: 30.0000 mg | ORAL_CAPSULE | Freq: Every day | ORAL | Status: DC

## 2014-06-10 NOTE — Patient Instructions (Addendum)
Please take all new medication as prescribed - the cymbalta once every day, and the tramadol as needed for pain  Please continue all other medications as before, and refills have been done if requested.  Please have the pharmacy call with any other refills you may need.  Please continue your efforts at being more active, low cholesterol diet, and weight control.  You are otherwise up to date with prevention measures today.  Please keep your appointments with your specialists as you may have planned  Please go to the LAB in the Basement (turn left off the elevator) for the tests to be done today  You will be contacted by phone if any changes need to be made immediately.  Otherwise, you will receive a letter about your results with an explanation, but please check with MyChart first.  You will be contacted regarding the referral for: pain clinic, and Gastroenterology   Please return in 6 months, or sooner if needed, with Lab testing done 3-5 days before

## 2014-06-10 NOTE — Progress Notes (Signed)
Subjective:    Patient ID: Shari Mcmahon, female    DOB: 09-Feb-1927, 80 y.o.   MRN: [id number]  HPI  Here to f/u; overall doing ok,  Pt denies chest pain, increased sob or doe, wheezing, orthopnea, PND, increased LE swelling, palpitations, dizziness or syncope.  Pt denies polydipsia, polyuria, or low sugar symptoms such as weakness or confusion improved with po intake.  Pt denies new neurological symptoms such as new headache, or facial or extremity weakness or numbness.   Pt states overall good compliance with meds, has been trying to follow lower cholesterol, diabetic diet, with wt overall stable,  but little exercise however.  Tolerating all meds fo rBP. Has chronic pain - neuropathy, knees and shoulders.  To f/u with Dr Tamala Julian, s/p right knee coritsone with some improveement, for left shoulder f/u soon.  Has not tried tramadol or cymbalta.    Gout better controlled with diet recent, working on less red meat in diet, only flare this yr. Usually gets EGD yearly for f/u gastric cancer Past Medical History  Diagnosis Date  . ABDOMINAL PAIN, CHRONIC 04/07/2008  . Abdominal pain, generalized 12/24/2009  . ARTHRITIS 03/03/2008  . BRADYCARDIA, CHRONIC 12/17/2008  . CARDIAC MURMUR 02/01/2010  . CONSTIPATION, CHRONIC 10/15/2010  . DEGENERATIVE JOINT DISEASE, KNEES, BILATERAL 12/07/2007  . DEPRESSION 06/17/2009  . DIABETES MELLITUS, TYPE II 09/19/2007  . DYSPHAGIA UNSPECIFIED 12/24/2009  . FATIGUE 01/27/2010  . FEVER UNSPECIFIED 09/30/2008  . Gastroparesis 12/24/2009  . GENERALIZED OSTEOARTHROSIS INVOLVING HAND 10/03/2007  . GLAUCOMA 09/19/2007  . GOUTY ARTHROPATHY UNSPECIFIED 02/17/2009  . GOUT 09/19/2007  . HYPERLIPIDEMIA 01/27/2010  . HYPERTENSION 09/19/2007  . LUNG NODULE 03/03/2008  . MENOPAUSAL DISORDER 12/17/2008  . PERIPHERAL NEUROPATHY 06/19/2008  . PERSONAL HISTORY MALIGNANT NEOPLASM STOMACH 09/19/2007  . 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 STOMACH CANCER 03/03/2008  . UTI 12/15/2009    . Arthritis of knee, degenerative 10/19/2011   Past Surgical History  Procedure Laterality Date  . Vagotomy    . Partial gastrectomy    . Billroth ii gastorenterostomy      reports that she has quit smoking. Her smoking use included Cigarettes. She smoked 0.00 packs per day. She has never used smokeless tobacco. She reports that she does not drink alcohol or use illicit drugs. family history includes Cancer in her sister; Colon cancer in her maternal grandmother and paternal grandfather; Diabetes in her other; Stroke in her father; Uterine cancer in her mother. Allergies  Allergen Reactions  . Dulcolax [Bisacodyl]     Unknown   . Hydrocodone-Acetaminophen     REACTION: Rash  . Penicillins     Unknown   . Timolol     REACTION: bradycardia worse   Current Outpatient Prescriptions on File Prior to Visit  Medication Sig Dispense Refill  . amLODipine (NORVASC) 5 MG tablet Take 1 tablet (5 mg total) by mouth daily.  90 tablet  3  . colchicine (COLCRYS) 0.6 MG tablet Take 0.6 mg by mouth daily.      Marland Kitchen gabapentin (NEURONTIN) 600 MG tablet Take 600 mg by mouth daily.       Marland Kitchen glipiZIDE (GLUCOTROL XL) 2.5 MG 24 hr tablet Take 2.5 mg by mouth daily as needed (For high blood sugar).       Marland Kitchen glucose blood (FREESTYLE LITE) test strip Use as instructed  100 each  11  . latanoprost (XALATAN) 0.005 % ophthalmic solution Place 1 drop into both eyes daily.        Marland Kitchen  losartan (COZAAR) 100 MG tablet Take 100 mg by mouth daily.      Marland Kitchen omeprazole (PRILOSEC) 20 MG capsule Take 20 mg by mouth daily.      . pravastatin (PRAVACHOL) 10 MG tablet Take 10 mg by mouth every evening.      Marland Kitchen tiZANidine (ZANAFLEX) 4 MG tablet Take 1 tablet (4 mg total) by mouth every 6 (six) hours as needed.  60 tablet  2   No current facility-administered medications on file prior to visit.    Review of Systems  Constitutional: Negative for unusual diaphoresis or other sweats  HENT: Negative for ringing in ear Eyes: Negative  for double vision or worsening visual disturbance.  Respiratory: Negative for choking and stridor.   Gastrointestinal: Negative for vomiting or other signifcant bowel change Genitourinary: Negative for hematuria or decreased urine volume.  Musculoskeletal: Negative for other MSK pain or swelling Skin: Negative for color change and worsening wound.  Neurological: Negative for tremors and numbness other than noted  Psychiatric/Behavioral: Negative for decreased concentration or agitation other than above       Objective:   Physical Exam BP 130/74  Pulse 58  Temp(Src) 98.2 F (36.8 C) (Oral)  Ht 5\' 6"  (1.676 m)  Wt 183 lb 4 oz (83.122 kg)  BMI 29.59 kg/m2  SpO2 93% VS noted,  Constitutional: Pt appears well-developed, well-nourished.  HENT: Head: NCAT.  Right Ear: External ear normal.  Left Ear: External ear normal.  Eyes: . Pupils are equal, round, and reactive to light. Conjunctivae and EOM are normal Neck: Normal range of motion. Neck supple.  Cardiovascular: Normal rate and regular rhythm.   Pulmonary/Chest: Effort normal and breath sounds normal.  Neurological: Pt is alert. Not confused , motor grossly intact Skin: Skin is warm. No rash Psychiatric: Pt behavior is normal. No agitation.     Assessment & Plan:

## 2014-06-10 NOTE — Progress Notes (Signed)
Pre visit review using our clinic review tool, if applicable. No additional management support is needed unless otherwise documented below in the visit note. 

## 2014-06-16 DIAGNOSIS — G894 Chronic pain syndrome: Secondary | ICD-10-CM | POA: Insufficient documentation

## 2014-06-16 DIAGNOSIS — C169 Malignant neoplasm of stomach, unspecified: Secondary | ICD-10-CM | POA: Insufficient documentation

## 2014-06-16 HISTORY — DX: Chronic pain syndrome: G89.4

## 2014-06-16 NOTE — Assessment & Plan Note (Signed)
With pain - for cymbalta asd, and tramadol prn

## 2014-06-16 NOTE — Assessment & Plan Note (Signed)
stable overall by history and exam, recent data reviewed with pt, and pt to continue medical treatment as before,  to f/u any worsening symptoms or concerns Lab Results  Component Value Date   HGBA1C 6.0 06/10/2014

## 2014-06-16 NOTE — Assessment & Plan Note (Signed)
stable overall by history and exam, recent data reviewed with pt, and pt to continue medical treatment as before,  to f/u any worsening symptoms or concerns BP Readings from Last 3 Encounters:  06/10/14 130/74  05/29/14 150/78  05/22/14 140/70

## 2014-06-16 NOTE — Assessment & Plan Note (Signed)
Also for pain clinic referral

## 2014-06-16 NOTE — Assessment & Plan Note (Signed)
stable overall by history and exam, recent data reviewed with pt, and pt to continue medical treatment as before,  to f/u any worsening symptoms or concerns Lab Results  Component Value Date   LDLCALC 76 06/10/2014

## 2014-06-16 NOTE — Assessment & Plan Note (Signed)
For GI referral, likely due for survelliance EGD

## 2014-06-17 ENCOUNTER — Telehealth: Payer: Self-pay | Admitting: *Deleted

## 2014-06-17 NOTE — Telephone Encounter (Signed)
Left msg on triage requesting call bck. Called pt back she wanted to know can she take the colchicine along with the medication md rx on 06/10/14. She stated her gout has flared up in her shoulder & knees. Inform pt she can take the colchicine with the meds md rx...Johny Chess

## 2014-06-20 ENCOUNTER — Encounter: Payer: Self-pay | Admitting: Family Medicine

## 2014-06-20 ENCOUNTER — Ambulatory Visit (INDEPENDENT_AMBULATORY_CARE_PROVIDER_SITE_OTHER): Payer: Medicare Other | Admitting: Family Medicine

## 2014-06-20 VITALS — BP 122/64 | HR 65 | Ht 66.0 in | Wt 179.0 lb

## 2014-06-20 DIAGNOSIS — M75102 Unspecified rotator cuff tear or rupture of left shoulder, not specified as traumatic: Secondary | ICD-10-CM

## 2014-06-20 DIAGNOSIS — M75101 Unspecified rotator cuff tear or rupture of right shoulder, not specified as traumatic: Secondary | ICD-10-CM

## 2014-06-20 DIAGNOSIS — M1710 Unilateral primary osteoarthritis, unspecified knee: Secondary | ICD-10-CM

## 2014-06-20 DIAGNOSIS — M12811 Other specific arthropathies, not elsewhere classified, right shoulder: Secondary | ICD-10-CM

## 2014-06-20 DIAGNOSIS — M19019 Primary osteoarthritis, unspecified shoulder: Secondary | ICD-10-CM

## 2014-06-20 DIAGNOSIS — M12812 Other specific arthropathies, not elsewhere classified, left shoulder: Secondary | ICD-10-CM

## 2014-06-20 NOTE — Assessment & Plan Note (Signed)
Discussed with patient at great length. Patient is done with the Synvisc injections and would need to do more corticosteroid injections. Patient has had multiple corticosteroid injections in her body and we discussed that the best way for her to possibly get pain relief would be knee replacement. Patient is not willing to do this at this time. Patient will continue with conservative therapy and come back in one month. Continuing to have pain we'll consider doing another injection.

## 2014-06-20 NOTE — Progress Notes (Signed)
[name], [place] [address] Phone: [phone] Subjective:      CC:  Bilateral knee pain followup, chronic pain syndrome, left shoulder pain  [id number] Shari Mcmahon is a 80 y.o. female coming in for followup for bilateral knee pain. Patient does have severe end-stage osteoarthritis of the knees bilaterally. One month ago patient did finish the Synvisc supplementation series. Patient states that she is mildly better at approximately 5% she states. Still having chronic pain in still needing to carry a cane. Patient is still not ready for surgical intervention. Patient continues to do the icing and bracing.  Patient is also having chronic left shoulder pain again patient has had multiple injections in the shoulder. Last one was in June and more than 10 weeks ago. Patient states that it gives her chronic soreness overall. Has known acute degenerative changes. Denies any fevers chills or any abnormal weight loss or any shortness of breath. Last x-rays were April 06, 2014.       Past medical history, social, surgical and family history all reviewed in electronic medical record.   Review of Systems: No headache, visual changes, nausea, vomiting, diarrhea, constipation, dizziness, abdominal pain, skin rash, fevers, chills, night sweats, weight loss, swollen lymph nodes, body aches, joint swelling, muscle aches, chest pain, shortness of breath, mood changes.   Objective Blood pressure 122/64, pulse 65, height 5\' 6"  (1.676 m), weight 179 lb (81.194 kg), SpO2 93.00%.  General: No apparent distress alert and oriented x3 mood and affect normal, dressed appropriately.  HEENT: Pupils equal, extraocular movements intact  Respiratory: Patient's speak in full sentences and does not appear short of breath  Cardiovascular: No lower extremity edema, non tender, no erythema  Skin: Warm dry intact with no signs of infection or rash on extremities or on  axial skeleton.  Abdomen: Soft nontender  Neuro: Cranial nerves II through XII are intact, neurovascularly intact in all extremities with 2+ DTRs and 2+ pulses.  Lymph: No lymphadenopathy of posterior or anterior cervical chain or axillae bilaterally.  Gait normal with good balance and coordination.  MSK: Non tender with full range of motion and good stability and symmetric strength and tone of elbows, wrist, knee and ankles bilaterally. Significant osteoarthritic changes in multiple joints.   Knee: Bilateral On standing patient does have a valgus deformity of the left knee compared to the right knee. The patient shows moderate joint with tenderness of the medial aspect. ROM full in flexion and extension and lower leg rotation. Ligaments with solid consistent endpoints including ACL, PCL, LCL, MCL.  painful patellar compression. Patellar glide with severe crepitus. Patellar and quadriceps tendons unremarkable. Hamstring and quadriceps strength is normal.  Contralateral knee has some tenderness to palpation over the medial joint line as well as mild decreased range of motion.  Shoulder: Bilateral  Inspection reveals no abnormalities, atrophy or asymmetry.  Palpation is normal with no tenderness over AC joint or bicipital groove.  ROM is decreased actively to forward flexion of 60 abduction to 40 and internal rotation to and lateral head. Passive range of motion does have forward flexion to 120 but significant crepitus causes patient pain. Other testing was not done secondary to herniated patient.  Rotator cuff strength is 3/5 bilaterally  Mild impingement  Speeds and Yergason's tests normal.  No labral pathology noted with negative Obrien's, negative clunk and good stability.  Normal scapular function observed.  No painful arc and no drop arm sign.  No  apprehension sign  After informed written and verbal consent, patient was seated on exam table. Left shoulder was prepped with  alcohol swab and utilizing posterior approach, patient's right glenohumeral space was injected with 4:1  marcaine 0.5%: Kenalog 40mg /dL. Patient tolerated the procedure well without immediate complications.    Impression and Recommendations:     This case required medical decision making of moderate complexity.

## 2014-06-20 NOTE — Assessment & Plan Note (Signed)
Discussed with patient again at great length that we can only do so many steroid injection before patient starts having side effects to the medications. We also discussed that they might not work in great amount. Patient states that she would still like to do this and elects for this even knowing the possibility of potential problems. Patient is not one further intervention at this time. Patient declined formal physical therapy. We'll continue with the home exercise program, icing, as well as over-the-counter medications.  Spent greater than 25 minutes with patient face-to-face and had greater than 50% of counseling including as described above in assessment and plan.

## 2014-06-20 NOTE — Patient Instructions (Signed)
Good to see you Add Vitamin D 2000 IU daily.  Consider turmeric 500mg  twice daily as well.  Ice is always your friend Come back in 1 month and we will consider injections in those knees again.

## 2014-06-27 ENCOUNTER — Ambulatory Visit: Payer: Medicare Other | Admitting: Family Medicine

## 2014-07-07 ENCOUNTER — Encounter (HOSPITAL_COMMUNITY): Payer: Self-pay | Admitting: Emergency Medicine

## 2014-07-07 ENCOUNTER — Emergency Department (HOSPITAL_COMMUNITY)
Admission: EM | Admit: 2014-07-07 | Discharge: 2014-07-07 | Disposition: A | Discharge status: Home / Self Care | Payer: Medicare Other | Attending: Emergency Medicine | Admitting: Emergency Medicine

## 2014-07-07 DIAGNOSIS — F3289 Other specified depressive episodes: Secondary | ICD-10-CM | POA: Insufficient documentation

## 2014-07-07 DIAGNOSIS — I498 Other specified cardiac arrhythmias: Secondary | ICD-10-CM | POA: Diagnosis not present

## 2014-07-07 DIAGNOSIS — I10 Essential (primary) hypertension: Secondary | ICD-10-CM | POA: Insufficient documentation

## 2014-07-07 DIAGNOSIS — F329 Major depressive disorder, single episode, unspecified: Secondary | ICD-10-CM | POA: Diagnosis not present

## 2014-07-07 DIAGNOSIS — Z8742 Personal history of other diseases of the female genital tract: Secondary | ICD-10-CM | POA: Diagnosis not present

## 2014-07-07 DIAGNOSIS — Z8719 Personal history of other diseases of the digestive system: Secondary | ICD-10-CM | POA: Diagnosis not present

## 2014-07-07 DIAGNOSIS — G609 Hereditary and idiopathic neuropathy, unspecified: Secondary | ICD-10-CM | POA: Diagnosis not present

## 2014-07-07 DIAGNOSIS — Z79899 Other long term (current) drug therapy: Secondary | ICD-10-CM | POA: Insufficient documentation

## 2014-07-07 DIAGNOSIS — Z87440 Personal history of urinary (tract) infections: Secondary | ICD-10-CM | POA: Insufficient documentation

## 2014-07-07 DIAGNOSIS — Z880 Allergy status to penicillin: Secondary | ICD-10-CM | POA: Diagnosis not present

## 2014-07-07 DIAGNOSIS — Z87891 Personal history of nicotine dependence: Secondary | ICD-10-CM | POA: Diagnosis not present

## 2014-07-07 DIAGNOSIS — E119 Type 2 diabetes mellitus without complications: Secondary | ICD-10-CM | POA: Insufficient documentation

## 2014-07-07 DIAGNOSIS — G8929 Other chronic pain: Secondary | ICD-10-CM | POA: Diagnosis not present

## 2014-07-07 DIAGNOSIS — H409 Unspecified glaucoma: Secondary | ICD-10-CM | POA: Insufficient documentation

## 2014-07-07 DIAGNOSIS — Z85028 Personal history of other malignant neoplasm of stomach: Secondary | ICD-10-CM | POA: Insufficient documentation

## 2014-07-07 DIAGNOSIS — M25569 Pain in unspecified knee: Secondary | ICD-10-CM | POA: Insufficient documentation

## 2014-07-07 DIAGNOSIS — M109 Gout, unspecified: Secondary | ICD-10-CM | POA: Diagnosis not present

## 2014-07-07 DIAGNOSIS — R011 Cardiac murmur, unspecified: Secondary | ICD-10-CM | POA: Insufficient documentation

## 2014-07-07 DIAGNOSIS — M25561 Pain in right knee: Secondary | ICD-10-CM

## 2014-07-07 DIAGNOSIS — E785 Hyperlipidemia, unspecified: Secondary | ICD-10-CM | POA: Diagnosis not present

## 2014-07-07 MED ORDER — HYDROCODONE-ACETAMINOPHEN 5-325 MG PO TABS
1.0000 | ORAL_TABLET | Freq: Four times a day (QID) | ORAL | Status: DC | PRN
Start: 2014-07-07 — End: 2014-07-29

## 2014-07-07 NOTE — Discharge Instructions (Signed)

## 2014-07-07 NOTE — ED Notes (Signed)
Pt reports chronic right knee pain that she normally sees a Ortho MD for, but the pain has become worse over the past couple of days. States that she an appt on Sept. 18th but is hurting to bad to wait.

## 2014-07-07 NOTE — ED Provider Notes (Signed)
CSN: [id number]     Arrival date & time 07/07/14  1117 History  This chart was scribed for non-physician practitioner Montine Circle, PA-C working with Johnna Acosta, MD by Ludger Nutting, ED Scribe. This patient was seen in room TR11C/TR11C and the patient's care was started at 12:09 PM.     Chief Complaint  Patient presents with  . Knee Pain    The history is provided by the patient. No language interpreter was used.    HPI Comments: Shari Mcmahon is a 80 y.o. female with past medical history of arthritis who presents to the Emergency Department complaining of chronic, constant right knee pain that gradually worsened over the last few days. She has an appointment to see her orthopedist on 07/18/14 but states the pain is too severe to wait until then. She has been given tramadol which she has taken without relief. She denies weakness, numbness.   Orthopedist Dr. Charlann Boxer  Past Medical History  Diagnosis Date  . ABDOMINAL PAIN, CHRONIC 04/07/2008  . Abdominal pain, generalized 12/24/2009  . ARTHRITIS 03/03/2008  . BRADYCARDIA, CHRONIC 12/17/2008  . CARDIAC MURMUR 02/01/2010  . CONSTIPATION, CHRONIC 10/15/2010  . DEGENERATIVE JOINT DISEASE, KNEES, BILATERAL 12/07/2007  . DEPRESSION 06/17/2009  . DIABETES MELLITUS, TYPE II 09/19/2007  . DYSPHAGIA UNSPECIFIED 12/24/2009  . FATIGUE 01/27/2010  . FEVER UNSPECIFIED 09/30/2008  . Gastroparesis 12/24/2009  . GENERALIZED OSTEOARTHROSIS INVOLVING HAND 10/03/2007  . GLAUCOMA 09/19/2007  . GOUTY ARTHROPATHY UNSPECIFIED 02/17/2009  . GOUT 09/19/2007  . HYPERLIPIDEMIA 01/27/2010  . HYPERTENSION 09/19/2007  . LUNG NODULE 03/03/2008  . MENOPAUSAL DISORDER 12/17/2008  . PERIPHERAL NEUROPATHY 06/19/2008  . PERSONAL HISTORY MALIGNANT NEOPLASM STOMACH 09/19/2007  . 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 STOMACH CANCER 03/03/2008  . UTI 12/15/2009  . Arthritis of knee, degenerative 10/19/2011   Past Surgical History  Procedure Laterality Date  .  Vagotomy    . Partial gastrectomy    . Billroth ii gastorenterostomy     Family History  Problem Relation Age of Onset  . Uterine cancer Mother   . Stroke Father     Hemorrhagic  . Cancer Sister     breast cancer and one sister with uterine cancer and one sister with ovarian cancerr  . Colon cancer Maternal Grandmother   . Colon cancer Paternal Grandfather   . Diabetes Other    History  Substance Use Topics  . Smoking status: Former Smoker    Types: Cigarettes  . Smokeless tobacco: Never Used     Comment: use to smoke a pack of cigaretts a day for 25 yrs. but stopped 10 yrs. ago.   . Alcohol Use: No   OB History   Grav Para Term Preterm Abortions TAB SAB Ect Mult Living                 Review of Systems  Constitutional: Negative for fever and chills.  Respiratory: Negative for shortness of breath.   Cardiovascular: Negative for chest pain.  Gastrointestinal: Negative for nausea, vomiting, diarrhea and constipation.  Genitourinary: Negative for dysuria.  Musculoskeletal: Positive for arthralgias.  Neurological: Negative for weakness and numbness.      Allergies  Dulcolax; Hydrocodone-acetaminophen; Penicillins; and Timolol  Home Medications   Prior to Admission medications   Medication Sig Start Date End Date Taking? Authorizing Provider  amLODipine (NORVASC) 5 MG tablet Take 1 tablet (5 mg total) by mouth daily. 02/26/14 02/26/15  Biagio Borg, MD  colchicine (COLCRYS) 0.6  MG tablet Take 0.6 mg by mouth daily.    Historical Provider, MD  DULoxetine (CYMBALTA) 30 MG capsule Take 1 capsule (30 mg total) by mouth daily. 06/10/14   Biagio Borg, MD  gabapentin (NEURONTIN) 600 MG tablet Take 600 mg by mouth daily.     Historical Provider, MD  glipiZIDE (GLUCOTROL XL) 2.5 MG 24 hr tablet Take 2.5 mg by mouth daily as needed (For high blood sugar).     Historical Provider, MD  glucose blood (FREESTYLE LITE) test strip Use as instructed 04/29/14   Biagio Borg, MD   latanoprost (XALATAN) 0.005 % ophthalmic solution Place 1 drop into both eyes daily.      Historical Provider, MD  losartan (COZAAR) 100 MG tablet Take 100 mg by mouth daily.    Historical Provider, MD  omeprazole (PRILOSEC) 20 MG capsule Take 20 mg by mouth daily.    Historical Provider, MD  pravastatin (PRAVACHOL) 10 MG tablet Take 10 mg by mouth every evening.    Historical Provider, MD  tiZANidine (ZANAFLEX) 4 MG tablet Take 1 tablet (4 mg total) by mouth every 6 (six) hours as needed. 04/08/13   Biagio Borg, MD  traMADol (ULTRAM) 50 MG tablet Take 1 tablet (50 mg total) by mouth every 6 (six) hours as needed. 06/10/14   Biagio Borg, MD   BP 110/52  Pulse 73  Temp(Src) 98.4 F (36.9 C) (Oral)  Resp 24  Ht 5\' 6"  (1.676 m)  Wt 179 lb (81.194 kg)  BMI 28.91 kg/m2  SpO2 99% Physical Exam  Nursing note and vitals reviewed. Constitutional: She is oriented to person, place, and time. She appears well-developed and well-nourished.  HENT:  Head: Normocephalic and atraumatic.  Cardiovascular: Normal rate.   Pulmonary/Chest: Effort normal.  Abdominal: She exhibits no distension.  Musculoskeletal: Normal range of motion. She exhibits tenderness.  Right knee mildly tender to palpation. No bony abnormality or deformity. Patient able to ambulate. ROM and strength 5/5  Neurological: She is alert and oriented to person, place, and time.  Skin: Skin is warm and dry.  Psychiatric: She has a normal mood and affect.    ED Course  Procedures (including critical care time)  DIAGNOSTIC STUDIES: Oxygen Saturation is 99% on RA, normal by my interpretation.    COORDINATION OF CARE: 12:12 PM Discussed treatment plan with pt at bedside and pt agreed to plan.   Labs Review Labs Reviewed - No data to display  Imaging Review No results found.   EKG Interpretation None      MDM   Final diagnoses:  Knee pain, chronic, right    Patient with chronic right knee pain.  She is followed by  ortho, and desires to have a cortisone shot here today.  I informed her that this will need to be completed by her orthopedic surgeon.  Patient understands and agrees.  Will give a knee sleeve and a short course of vicodin, which I have instructed her to take only 1 tab per day and to take it when she can rest at home.  Advised her that this medicine can make her dizzy.  She states that she has done fine with it in the past.  I personally performed the services described in this documentation, which was scribed in my presence. The recorded information has been reviewed and is accurate.    Montine Circle, PA-C 07/07/14 1642

## 2014-07-08 ENCOUNTER — Ambulatory Visit (INDEPENDENT_AMBULATORY_CARE_PROVIDER_SITE_OTHER): Payer: Medicare Other | Admitting: Family Medicine

## 2014-07-08 ENCOUNTER — Ambulatory Visit: Payer: Medicare Other | Admitting: Gastroenterology

## 2014-07-08 VITALS — BP 130/62 | HR 70 | Ht 66.0 in | Wt 179.0 lb

## 2014-07-08 DIAGNOSIS — M1710 Unilateral primary osteoarthritis, unspecified knee: Secondary | ICD-10-CM

## 2014-07-08 NOTE — Patient Instructions (Signed)
Very good to see you Will try another set of injections in your knees.  Ice is your friend Continue the bracing when you can.  Need to wait until February for the Synvisc again.  If injections do not help we will need to consider surgery.  See you again in 4 weeks.

## 2014-07-08 NOTE — Progress Notes (Signed)
  [name] [place], [place] [address] Phone: [phone] Subjective:      CC:  Bilateral knee pain followup  [id number] Shari Mcmahon is a 80 y.o. female coming in for followup for bilateral knee pain. Patient was seen in the emergency department one day ago but due to severe pain. Patient does have end-stage bone-on-bone osteoarthritis and is in need of surgical repair but due to patient's past medical history in her family of her sister not doing well she is not wanting to have surgical intervention. Patient did have Synvisc injections finishing the series greater than one month ago. Patient states that the pain is starting to come back in the knees and is making it very difficult for ambulation. Patient was a severity of 9/10 and would like steroid injections into her knees again. Patient states it is affecting all daily activity.       Past medical history, social, surgical and family history all reviewed in electronic medical record.   Review of Systems: No headache, visual changes, nausea, vomiting, diarrhea, constipation, dizziness, abdominal pain, skin rash, fevers, chills, night sweats, weight loss, swollen lymph nodes, body aches, joint swelling, muscle aches, chest pain, shortness of breath, mood changes.   Objective Blood pressure 130/62, pulse 70, height 5\' 6"  (1.676 m), weight 179 lb (81.194 kg), SpO2 91.00%.  General: No apparent distress alert and oriented x3 mood and affect normal, dressed appropriately.  HEENT: Pupils equal, extraocular movements intact  Respiratory: Patient's speak in full sentences and does not appear short of breath  Cardiovascular: No lower extremity edema, non tender, no erythema  Skin: Warm dry intact with no signs of infection or rash on extremities or on axial skeleton.  Abdomen: Soft nontender  Neuro: Cranial nerves II through XII are intact, neurovascularly intact in all extremities with 2+ DTRs  and 2+ pulses.  Lymph: No lymphadenopathy of posterior or anterior cervical chain or axillae bilaterally.  Gait normal with good balance and coordination.  MSK: Non tender with full range of motion and good stability and symmetric strength and tone of elbows, wrist, knee and ankles bilaterally. Significant osteoarthritic changes in multiple joints.   Knee: Bilateral On standing patient does have a valgus deformity of the left knee compared to the right knee. Severe tenderness to palpation on the medial joint lines bilaterally ROM full in flexion and extension and lower leg rotation. Ligaments with solid consistent endpoints including ACL, PCL, LCL, MCL.  painful patellar compression. Patellar glide with severe crepitus. Patellar and quadriceps tendons unremarkable. Hamstring and quadriceps strength is normal.  Contralateral knee has some tenderness to palpation over the medial joint line as well as mild decreased range of motion.  After informed written and verbal consent, patient was seated on exam table. Right knee was prepped with alcohol swab and utilizing anterolateral approach, patient's right knee space was injected with 4:1  marcaine 0.5%: Kenalog 40mg /dL. Patient tolerated the procedure well without immediate complications.  After informed written and verbal consent, patient was seated on exam table. Left knee was prepped with alcohol swab and utilizing anterolateral approach, patient's left knee space was injected with 4:1  marcaine 0.5%: Kenalog 40mg /dL. Patient tolerated the procedure well without immediate complications.   Impression and Recommendations:     This case required medical decision making of moderate complexity.

## 2014-07-08 NOTE — Assessment & Plan Note (Signed)
Patient was given and steroid injection today and we cannot do another one until the end of November. We discussed continuing the bracing, icing, and home exercises. We discussed in great detail that patient's only way of getting rid of the pain at this time would be surgical intervention. Patient still declines any referral to a surgeon for further evaluation. Discussed with patient though at this time her health seems to be relatively stable and probably is the best time for her to have this done. Patient will call back if she makes any change in her mind. Patient otherwise will follow up again in 4-6 weeks for further evaluation.  Spent greater than 25 minutes with patient face-to-face and had greater than 50% of counseling including as described above in assessment and plan.

## 2014-07-09 NOTE — ED Provider Notes (Signed)
Medical screening examination/treatment/procedure(s) were performed by non-physician practitioner and as supervising physician I was immediately available for consultation/collaboration.    Johnna Acosta, MD 07/09/14 [phone]

## 2014-07-15 ENCOUNTER — Ambulatory Visit: Payer: Medicare Other | Admitting: Family Medicine

## 2014-07-18 ENCOUNTER — Ambulatory Visit: Payer: Medicare Other | Admitting: Family Medicine

## 2014-07-22 ENCOUNTER — Telehealth: Payer: Self-pay | Admitting: Gastroenterology

## 2014-07-22 NOTE — Telephone Encounter (Signed)
Spoke with patient and she states for the last 2 weeks, she has had difficulty swallowing. States after 2-3 bites, it does not want to go down. She is doing Ensure. She is taking Omeprazole daily. Hx stomach cancer. 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 She does not have preference for new MD. Scheduled with Tye Savoy, NP on 07/29/14 at 9:30 AM.(next OV available)

## 2014-07-29 ENCOUNTER — Encounter: Payer: Self-pay | Admitting: Nurse Practitioner

## 2014-07-29 ENCOUNTER — Ambulatory Visit (INDEPENDENT_AMBULATORY_CARE_PROVIDER_SITE_OTHER): Payer: Medicare Other | Admitting: Nurse Practitioner

## 2014-07-29 VITALS — BP 112/58 | HR 88 | Ht 66.0 in | Wt 171.0 lb

## 2014-07-29 DIAGNOSIS — R1319 Other dysphagia: Secondary | ICD-10-CM

## 2014-07-29 DIAGNOSIS — R1310 Dysphagia, unspecified: Secondary | ICD-10-CM

## 2014-07-29 DIAGNOSIS — C169 Malignant neoplasm of stomach, unspecified: Secondary | ICD-10-CM

## 2014-07-29 NOTE — Patient Instructions (Addendum)
You have been scheduled for an endoscopy. Please follow written instructions given to you at your visit today. If you use inhalers (even only as needed), please bring them with you on the day of your procedure. Your physician has requested that you go to www.startemmi.com and enter the access code given to you at your visit today. This web site gives a general overview about your procedure. However, you should still follow specific instructions given to you by our office regarding your preparation for the procedure.  We discussed the importance of eating slowly, taking small bites of food, chewing well and consuming adequate amounts of fluid in between bites to avoid food impaction.   You have been scheduled for a Barium Esophogram at [facility] (1st floor of the hospital) on 08-01-2014 at 830 AM. Please arrive 15 minutes prior to your appointment for registration. Make certain not to have anything to eat or drink 6 hours prior to your test. If you need to reschedule for any reason, please contact radiology at [phone] to do so. __________________________________________________________________ A barium swallow is an examination that concentrates on views of the esophagus. This tends to be a double contrast exam (barium and two liquids which, when combined, create a gas to distend the wall of the oesophagus) or single contrast (non-ionic iodine based). The study is usually tailored to your symptoms so a good history is essential. Attention is paid during the study to the form, structure and configuration of the esophagus, looking for functional disorders (such as aspiration, dysphagia, achalasia, motility and reflux) EXAMINATION You may be asked to change into a gown, depending on the type of swallow being performed. A radiologist and radiographer will perform the procedure. The radiologist will advise you of the type of contrast selected for your procedure and direct you during the  exam. You will be asked to stand, sit or lie in several different positions and to hold a small amount of fluid in your mouth before being asked to swallow while the imaging is performed .In some instances you may be asked to swallow barium coated marshmallows to assess the motility of a solid food bolus. The exam can be recorded as a digital or video fluoroscopy procedure. POST PROCEDURE It will take 1-2 days for the barium to pass through your system. To facilitate this, it is important, unless otherwise directed, to increase your fluids for the next 24-48hrs and to resume your normal diet.  This test typically takes about 30 minutes to perform. __________________________________________________________________________________

## 2014-07-30 ENCOUNTER — Encounter: Payer: Self-pay | Admitting: Nurse Practitioner

## 2014-07-30 NOTE — Progress Notes (Signed)
I agree with the note, plan above 

## 2014-07-30 NOTE — Progress Notes (Signed)
     History of Present Illness:   Patient is an 80 year old female, former patient of Dr. Sharlett Iles, with a history of gastric cancer in 2005. She is status post gastric resection and chemoradiationin.patient has gastroparesis associated with vagotomy and gastric surgery. Patient has done fairly well since surgery from a gastrointestinal standpoint. She was last seen February 2014 with complaints of early satiety and regurgitation. Upper endoscopy following that visit revealed a partial gastrectomy with a Billroth II anatomy. Anastomosis was widely patent, no evidence for recurrent cancer. Gastric biopsies compatible with mild chronic, inactive gastritis. No H. Pylori.  Patient comes in today with a 3-4 week history of solid food dysphasia. Even tiny right-sided food or sticking. She can tolerate fluids though they can at times be a little tough to get down as well. She has a documented weight loss of 8 pounds over the last month. No odynophagia. No abdominal pain. No significant nausea  Current Medications, Allergies, Past Medical History, Past Surgical History, Family History and Social History were reviewed in [employer] record.  Physical Exam: General: Pleasant, well developed , black female in no acute distress Head: Normocephalic and atraumatic Eyes:  sclerae anicteric, conjunctiva pink  Ears: Normal auditory acuity Lungs: Clear throughout to auscultation Heart: Regular rate and rhythm Abdomen: Soft, non distended, non-tender. No masses, no hepatomegaly. Normal bowel sounds Musculoskeletal: Symmetrical with no gross deformities  Extremities: No edema  Neurological: Alert oriented x 4, grossly nonfocal Psychological:  Alert and cooperative. Normal mood and affect  Assessment and Recommendations: 28. 80 year old female with a 3 to 4 week history of dysphagia, mainly to solid foods. She has documented weight loss of 8 pounds over the last month. Esophagus was  unremarkable on EGD February 2014.  Rule out stricture / dysmotility. For further evaluation patient will be scheduled for barium swallow with tablet. I tentatively scheduled her for EGD which can be cancelled if barium swallow is normal. The benefits, risks, and potential complications of EGD with possible biopsies and/or dilation were discussed with the patient and she agrees to proceed. We discussed the importance of eating slowly, taking small bites of food, chewing well and consuming adequate amounts of fluid in between bites to avoid food impaction.   2. History of gastric cancer, s/p resection and chemoradiation in 2005. She has a Bilroth II. Patient carries a diagnosis of gastroparesis, she had a vagotomy    .

## 2014-08-01 ENCOUNTER — Encounter: Payer: Self-pay | Admitting: Gastroenterology

## 2014-08-01 ENCOUNTER — Ambulatory Visit (HOSPITAL_COMMUNITY)
Admission: RE | Admit: 2014-08-01 | Discharge: 2014-08-01 | Disposition: A | Discharge status: Home / Self Care | Payer: Medicare Other | Source: Ambulatory Visit | Attending: Nurse Practitioner | Admitting: Nurse Practitioner

## 2014-08-01 DIAGNOSIS — R1310 Dysphagia, unspecified: Secondary | ICD-10-CM | POA: Diagnosis not present

## 2014-08-04 ENCOUNTER — Telehealth: Payer: Self-pay | Admitting: Nurse Practitioner

## 2014-08-04 ENCOUNTER — Telehealth: Payer: Self-pay | Admitting: Internal Medicine

## 2014-08-04 NOTE — Telephone Encounter (Signed)
Would like to see if Dr. Jenny Reichmann could get her worked in to GI sooner

## 2014-08-04 NOTE — Telephone Encounter (Signed)
Patient is calling for results. Please, advise.

## 2014-08-04 NOTE — Telephone Encounter (Signed)
Would like to know if Dr. Jenny Reichmann could get her in to GI sooner.

## 2014-08-05 ENCOUNTER — Telehealth: Payer: Self-pay | Admitting: Gastroenterology

## 2014-08-05 NOTE — Telephone Encounter (Signed)
Patient was requesting results from test done on Friday at [facility].  The patient has requested results from GI as well and they informed her once they have results they will call her.  Patient stated she will wait to hear back from GI.

## 2014-08-05 NOTE — Telephone Encounter (Signed)
Pt was just seen sept 29 by PA - I would follow those recommendations, thanks

## 2014-08-06 ENCOUNTER — Encounter: Payer: Self-pay | Admitting: Gastroenterology

## 2014-08-07 NOTE — Telephone Encounter (Signed)
Scheduled for EGD.

## 2014-08-07 NOTE — Telephone Encounter (Signed)
Already addressed, she is scheduled for EGD

## 2014-08-13 ENCOUNTER — Ambulatory Visit (INDEPENDENT_AMBULATORY_CARE_PROVIDER_SITE_OTHER): Payer: Medicare Other | Admitting: Family

## 2014-08-13 ENCOUNTER — Telehealth: Payer: Self-pay | Admitting: Family

## 2014-08-13 ENCOUNTER — Other Ambulatory Visit (INDEPENDENT_AMBULATORY_CARE_PROVIDER_SITE_OTHER): Payer: Medicare Other

## 2014-08-13 ENCOUNTER — Other Ambulatory Visit: Payer: Medicare Other

## 2014-08-13 ENCOUNTER — Encounter: Payer: Self-pay | Admitting: Family

## 2014-08-13 VITALS — BP 158/70 | HR 70 | Temp 98.1°F | Resp 16 | Wt 168.8 lb

## 2014-08-13 DIAGNOSIS — D531 Other megaloblastic anemias, not elsewhere classified: Secondary | ICD-10-CM | POA: Insufficient documentation

## 2014-08-13 DIAGNOSIS — R319 Hematuria, unspecified: Secondary | ICD-10-CM

## 2014-08-13 DIAGNOSIS — Q998 Other specified chromosome abnormalities: Secondary | ICD-10-CM

## 2014-08-13 LAB — BASIC METABOLIC PANEL
BUN: 28 mg/dL — ABNORMAL HIGH (ref 6–23)
CO2: 27 meq/L (ref 19–32)
Calcium: 9.8 mg/dL (ref 8.4–10.5)
Chloride: 104 mEq/L (ref 96–112)
Creatinine, Ser: 1.6 mg/dL — ABNORMAL HIGH (ref 0.4–1.2)
GFR: 39.05 mL/min — ABNORMAL LOW (ref >60.00)
GLUCOSE: 106 mg/dL — AB (ref 70–99)
POTASSIUM: 4.7 meq/L (ref 3.5–5.1)
SODIUM: 136 meq/L (ref 135–145)

## 2014-08-13 LAB — CBC
HCT: 39.6 % (ref 36.0–46.0)
Hemoglobin: 12.5 g/dL (ref 12.0–15.0)
MCHC: 31.5 g/dL (ref 30.0–36.0)
MCV: 100.7 fl — ABNORMAL HIGH (ref 78.0–100.0)
Platelets: 178 10*3/uL (ref 150.0–400.0)
RBC: 3.93 Mil/uL (ref 3.87–5.11)
RDW: 16.1 % — ABNORMAL HIGH (ref 11.5–15.5)
WBC: 7.6 10*3/uL (ref 4.0–10.5)

## 2014-08-13 LAB — POCT URINALYSIS DIPSTICK
Bilirubin, UA: NEGATIVE
Glucose, UA: NEGATIVE
KETONES UA: NEGATIVE
Nitrite, UA: NEGATIVE
PH UA: 6
Spec Grav, UA: 1.025
Urobilinogen, UA: 0.2

## 2014-08-13 MED ORDER — SULFAMETHOXAZOLE-TMP DS 800-160 MG PO TABS: 1.0000 | ORAL_TABLET | Freq: Two times a day (BID) | ORAL | Status: DC

## 2014-08-13 NOTE — Progress Notes (Signed)
Subjective:    Patient ID: Shari Mcmahon, female    DOB: [birth year], 80 y.o.   MRN: [id number]  HPI:  Shari Mcmahon is a 80 y.o. female who presents today for acute visit.   This morning she felt light headed and noted that she had bright red blood when she urinated. States she got dizzy which has never happened before. There was also some bright red blood on her depends. Still feels dizzy/swimmy headed - which goes away when she is seated. For the last couple of weeks has not been going to the bathroom a lot because she has been unable to eat or drink secondary to dysphasia which is being treated by GI. Denies any urinary frequency, urgency, dysuria, fever, chills, or back pain.   Allergies  Allergen Reactions  . Dulcolax [Bisacodyl]     Unknown   . Hydrocodone-Acetaminophen     REACTION: Rash  . Penicillins     Unknown   . Timolol     REACTION: bradycardia worse   Current Outpatient Prescriptions on File Prior to Visit  Medication Sig Dispense Refill  . amLODipine (NORVASC) 5 MG tablet Take 1 tablet (5 mg total) by mouth daily.  90 tablet  3  . DULoxetine (CYMBALTA) 30 MG capsule Take 1 capsule (30 mg total) by mouth daily.  90 capsule  3  . glucose blood (FREESTYLE LITE) test strip Use as instructed  100 each  11  . losartan (COZAAR) 100 MG tablet Take 100 mg by mouth daily.      Marland Kitchen omeprazole (PRILOSEC) 20 MG capsule Take 20 mg by mouth daily.      . pravastatin (PRAVACHOL) 10 MG tablet Take 10 mg by mouth every evening.      Marland Kitchen tiZANidine (ZANAFLEX) 4 MG tablet Take 1 tablet (4 mg total) by mouth every 6 (six) hours as needed.  60 tablet  2  . traMADol (ULTRAM) 50 MG tablet Take 1 tablet (50 mg total) by mouth every 6 (six) hours as needed.  120 tablet  2   No current facility-administered medications on file prior to visit.   Past Medical History  Diagnosis Date  . ABDOMINAL PAIN, CHRONIC 04/07/2008  . ARTHRITIS 03/03/2008  . BRADYCARDIA, CHRONIC 12/17/2008  . CARDIAC MURMUR  02/01/2010  . CONSTIPATION, CHRONIC 10/15/2010  . DEGENERATIVE JOINT DISEASE, KNEES, BILATERAL 12/07/2007  . DEPRESSION 06/17/2009  . DIABETES MELLITUS, TYPE II 09/19/2007    DIET CONTROL   . DYSPHAGIA UNSPECIFIED 12/24/2009  . Gastroparesis 12/24/2009  . GENERALIZED OSTEOARTHROSIS INVOLVING HAND 10/03/2007  . GLAUCOMA 09/19/2007  . GOUTY ARTHROPATHY UNSPECIFIED 02/17/2009  . GOUT 09/19/2007  . HYPERLIPIDEMIA 01/27/2010  . HYPERTENSION 09/19/2007  . LUNG NODULE 03/03/2008  . MENOPAUSAL DISORDER 12/17/2008  . PERIPHERAL NEUROPATHY 06/19/2008  . PERSONAL HISTORY MALIGNANT NEOPLASM STOMACH 09/19/2007  . 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 STOMACH CANCER 03/03/2008  . UTI 12/15/2009  . Arthritis of knee, degenerative 10/19/2011    Review of Systems  See HPI     Objective:     BP 158/70  Pulse 70  Temp(Src) 98.1 F (36.7 C) (Oral)  Resp 16  Wt 168 lb 12.8 oz (76.567 kg)  SpO2 96% Nursing note and vital signs reviewed.  Physical Exam  Constitutional: She is oriented to person, place, and time. She appears well-developed and well-nourished. No distress.  Cardiovascular: Normal rate, regular rhythm and normal heart sounds.   Pulmonary/Chest: Effort normal and breath sounds normal.  Abdominal: There  is no CVA tenderness.  Neurological: She is alert and oriented to person, place, and time.  Skin: Skin is warm and dry.  Psychiatric: She has a normal mood and affect. Her behavior is normal. Judgment and thought content normal.        Assessment & Plan:

## 2014-08-13 NOTE — Assessment & Plan Note (Signed)
CBC shows developing megaloblastic anemia. Will advise patient to schedule follow up for Vitamin B12 and Folate levels.

## 2014-08-13 NOTE — Patient Instructions (Signed)
Thank you for choosing [facility].  Summary/Instructions:   Please stop by the lab prior to leaving  Your prescription has been sent to the pharmacy  If your symptoms worsen or fail to improve, please let us know.  If you see bright red blood in your urine after the antibiotic, please let us know.   Take if your time when changing positions.

## 2014-08-13 NOTE — Progress Notes (Signed)
Pre visit review using our clinic review tool, if applicable. No additional management support is needed unless otherwise documented below in the visit note. 

## 2014-08-13 NOTE — Telephone Encounter (Signed)
Please call patient to inform her that her blood levels do not indicate any bleeding, however they do indicate there is a potential Vitamin B12/Folate deficiency. For this I would like her to stop by the lab to have her levels tested. The orders have already been placed. In addition, her kidney function has declined a little which is most likely related to her decreased food and fluid intake. She needs to increase her fluid intake as much as possible, and will need to follow up with Dr. Jenny Reichmann in a couple of weeks.

## 2014-08-13 NOTE — Assessment & Plan Note (Addendum)
UA positive for leukocytes and a trace of blood with no visible blood in sample provided. Will obtain CBC and BMET secondary to patients description of blood in urine. Start Bactrim. Instructed to drink plenty of fluids as able and drink cranberry juice on occasion to help decrease risk of future infection. Will follow up if symptoms worsen or fail to improve.   ADDENDUM:  CBC shows developing megaloblastic anemia. Will advise patient to schedule follow up for Vitamin B12 and Folate levels.   BMET reveals increase in creatinine and decreased GFR. This is most likely related to patient's inability to drink adequate fluids. Recommend follow up with PCP.

## 2014-08-14 NOTE — Telephone Encounter (Signed)
Called pt to let her know that she did have vitamin b12/folate deficiency. She has an appointment to get an endoscopy done tomorrow morning at 10am and said she would swing by the lab to have levels tested. I stressed to her to increase her fluid intake and try to eat as much as she could.

## 2014-08-15 ENCOUNTER — Ambulatory Visit (AMBULATORY_SURGERY_CENTER): Payer: Medicare Other | Admitting: Gastroenterology

## 2014-08-15 ENCOUNTER — Encounter: Payer: Self-pay | Admitting: Gastroenterology

## 2014-08-15 ENCOUNTER — Telehealth: Payer: Self-pay | Admitting: Internal Medicine

## 2014-08-15 VITALS — BP 133/71 | HR 66 | Temp 95.3°F | Resp 19 | Ht 66.0 in | Wt 171.0 lb

## 2014-08-15 DIAGNOSIS — K3189 Other diseases of stomach and duodenum: Secondary | ICD-10-CM

## 2014-08-15 DIAGNOSIS — K2970 Gastritis, unspecified, without bleeding: Secondary | ICD-10-CM

## 2014-08-15 DIAGNOSIS — R1314 Dysphagia, pharyngoesophageal phase: Secondary | ICD-10-CM

## 2014-08-15 DIAGNOSIS — K2990 Gastroduodenitis, unspecified, without bleeding: Secondary | ICD-10-CM

## 2014-08-15 LAB — URINE CULTURE: Colony Count: 30000

## 2014-08-15 MED ORDER — OMEPRAZOLE 40 MG PO CPDR: 40.0000 mg | DELAYED_RELEASE_CAPSULE | Freq: Every day | ORAL | Status: DC

## 2014-08-15 MED ORDER — SODIUM CHLORIDE 0.9 % IV SOLN: 500.0000 mL | INTRAVENOUS | Status: DC

## 2014-08-15 NOTE — Patient Instructions (Signed)
YOU HAD AN ENDOSCOPIC PROCEDURE TODAY AT THE [facility]: Refer to the procedure report that was given to you for any specific questions about what was found during the examination.  If the procedure report does not answer your questions, please call your gastroenterologist to clarify.  If you requested that your care partner not be given the details of your procedure findings, then the procedure report has been included in a sealed envelope for you to review at your convenience later.  YOU SHOULD EXPECT: Some feelings of bloating in the abdomen. Passage of more gas than usual.  Walking can help get rid of the air that was put into your GI tract during the procedure and reduce the bloating. If you had a lower endoscopy (such as a colonoscopy or flexible sigmoidoscopy) you may notice spotting of blood in your stool or on the toilet paper. If you underwent a bowel prep for your procedure, then you may not have a normal bowel movement for a few days.  DIET: Your first meal following the procedure should be a light meal and then it is ok to progress to your normal diet.  A half-sandwich or bowl of soup is an example of a good first meal.  Heavy or fried foods are harder to digest and may make you feel nauseous or bloated.  Likewise meals heavy in dairy and vegetables can cause extra gas to form and this can also increase the bloating.  Drink plenty of fluids but you should avoid alcoholic beverages for 24 hours.  ACTIVITY: Your care partner should take you home directly after the procedure.  You should plan to take it easy, moving slowly for the rest of the day.  You can resume normal activity the day after the procedure however you should NOT DRIVE or use heavy machinery for 24 hours (because of the sedation medicines used during the test).    SYMPTOMS TO REPORT IMMEDIATELY: A gastroenterologist can be reached at any hour.  During normal business hours, 8:30 AM to 5:00 PM Monday through Friday,  call [phone].  After hours and on weekends, please call the GI answering service at [phone] who will take a message and have the physician on call contact you.     Following upper endoscopy (EGD)  Vomiting of blood or coffee ground material  New chest pain or pain under the shoulder blades  Painful or persistently difficult swallowing  New shortness of breath  Fever of 100F or higher  Black, tarry-looking stools  FOLLOW UP: If any biopsies were taken you will be contacted by phone or by letter within the next 1-3 weeks.  Call your gastroenterologist if you have not heard about the biopsies in 3 weeks.  Our staff will call the home number listed on your records the next business day following your procedure to check on you and address any questions or concerns that you may have at that time regarding the information given to you following your procedure. This is a courtesy call and so if there is no answer at the home number and we have not heard from you through the emergency physician on call, we will assume that you have returned to your regular daily activities without incident.  SIGNATURES/CONFIDENTIALITY: You and/or your care partner have signed paperwork which will be entered into your electronic medical record.  These signatures attest to the fact that that the information above on your After Visit Summary has been reviewed and is understood.    Full responsibility of the confidentiality of this discharge information lies with you and/or your care-partner.    Await biopsy results 

## 2014-08-15 NOTE — Telephone Encounter (Signed)
She does not need an appointment - just the follow up lab work for her B12 and folate.

## 2014-08-15 NOTE — Op Note (Signed)
[facility]  [address] [place], [address]   ENDOSCOPY PROCEDURE REPORT  PATIENT: Shari, Mcmahon  MR#: [id number] BIRTHDATE: [birth year] , 49  yrs. old GENDER: female ENDOSCOPIST: Milus Banister, MD PROCEDURE DATE:  08/15/2014 PROCEDURE:  EGD w/ biopsy ASA CLASS:     Class III INDICATIONS:  h/o distal gastric cancer, s/p Bilroth II resection, anastomosis; now with dysphagia, weight loss.Marland Kitchen MEDICATIONS: Monitored anesthesia care and Propofol 150 mg IV TOPICAL ANESTHETIC: none  DESCRIPTION OF PROCEDURE: After the risks benefits and alternatives of the procedure were thoroughly explained, informed consent was obtained.  The LB [id number] endoscope was introduced through the mouth and advanced to the second portion of the duodenum , Without limitations.  The instrument was slowly withdrawn as the mucosa was fully examined.  The Bilroth II anastomosis was mildy erythematous, but widely patent.  Biopsies taken from anastomosis and from gastric remnant. The remnant stomach was very friable, the biopsy sites bled more than usual and retroflexion caused a 1.5cm long linear tear in the proximal stomach that oozed blood and appeared to involve deep mucosal, submucosal layers.  I repaired the defect with placment of 3 endoclips in good position.  There was some mild tortuousity of the distal esophagus, no masses or mucosal abnormalities. Retroflexed views revealed as previously described.     The scope was then withdrawn from the patient and the procedure completed.  COMPLICATIONS: There were no immediate complications.  ENDOSCOPIC IMPRESSION: The Bilroth II anastomosis was mildy erythematous, but widely patent.  Biopsies taken from anastomosis and from gastric remnant. The remnant stomach was very friable, the biopsy sites bled more than usual and retroflexion caused a 1.5cm long linear tear in the proximal stomach that oozed blood and appeared to involve  deep mucosal, submucosal layers.  I repaired the defect with placment of 3 endoclips in good position.  There was some mild tortuousity of the distal esophagus, no masses or mucosal abnormalities  RECOMMENDATIONS: Await final pathology results.  For now, chew your food well, eat slowly and take small bites.   Will increase your antiacid coverage to prescription strength PPI, twice daily for now.  Pending biopsy results, will likely arrange CT scan abdomen, chest.   eSigned:  Milus Banister, MD 08/15/2014 11:21 AM

## 2014-08-15 NOTE — Progress Notes (Signed)
Called to room to assist during endoscopic procedure.  Patient ID and intended procedure confirmed with present staff. Received instructions for my participation in the procedure from the performing physician.  

## 2014-08-15 NOTE — Telephone Encounter (Signed)
Note to be forwarded to most previous provider

## 2014-08-15 NOTE — Telephone Encounter (Signed)
Pt wants to know if she needs to come to the office or check appt. She is under the assumption that she does. Staff does not see a note indicating thatl. Please advise

## 2014-08-15 NOTE — Progress Notes (Signed)
A/ox3 pleased with MAC, report to Jane RN 

## 2014-08-16 LAB — URINE CULTURE

## 2014-08-18 ENCOUNTER — Telehealth: Payer: Self-pay | Admitting: *Deleted

## 2014-08-18 NOTE — Telephone Encounter (Signed)
  Follow up Call-  Call back number 08/15/2014 12/05/2012  Post procedure Call Back phone  # [phone]  Permission to leave phone message Yes Yes     Patient questions:  Do you have a fever, pain , or abdominal swelling? No. Pain Score  0 *  Have you tolerated food without any problems? Yes.    Have you been able to return to your normal activities? Yes.    Do you have any questions about your discharge instructions: Diet   No. Medications  No. Follow up visit  No.  Do you have questions or concerns about your Care? No.  Actions: * If pain score is 4 or above: No action needed, pain <4.

## 2014-08-18 NOTE — Telephone Encounter (Signed)
Call placed by accident.

## 2014-08-29 ENCOUNTER — Encounter (HOSPITAL_COMMUNITY): Payer: Self-pay | Admitting: Emergency Medicine

## 2014-08-29 ENCOUNTER — Other Ambulatory Visit: Payer: Self-pay | Admitting: *Deleted

## 2014-08-29 ENCOUNTER — Emergency Department (HOSPITAL_COMMUNITY)
Admission: EM | Admit: 2014-08-29 | Discharge: 2014-08-29 | Disposition: A | Discharge status: Home / Self Care | Payer: Medicare Other | Attending: Emergency Medicine | Admitting: Emergency Medicine

## 2014-08-29 ENCOUNTER — Emergency Department (HOSPITAL_COMMUNITY): Payer: Medicare Other

## 2014-08-29 DIAGNOSIS — R011 Cardiac murmur, unspecified: Secondary | ICD-10-CM | POA: Diagnosis not present

## 2014-08-29 DIAGNOSIS — G8929 Other chronic pain: Secondary | ICD-10-CM | POA: Insufficient documentation

## 2014-08-29 DIAGNOSIS — Z7952 Long term (current) use of systemic steroids: Secondary | ICD-10-CM | POA: Diagnosis not present

## 2014-08-29 DIAGNOSIS — I10 Essential (primary) hypertension: Secondary | ICD-10-CM | POA: Insufficient documentation

## 2014-08-29 DIAGNOSIS — M179 Osteoarthritis of knee, unspecified: Secondary | ICD-10-CM | POA: Diagnosis not present

## 2014-08-29 DIAGNOSIS — E119 Type 2 diabetes mellitus without complications: Secondary | ICD-10-CM | POA: Diagnosis not present

## 2014-08-29 DIAGNOSIS — M25562 Pain in left knee: Secondary | ICD-10-CM

## 2014-08-29 DIAGNOSIS — H409 Unspecified glaucoma: Secondary | ICD-10-CM | POA: Insufficient documentation

## 2014-08-29 DIAGNOSIS — Z880 Allergy status to penicillin: Secondary | ICD-10-CM | POA: Insufficient documentation

## 2014-08-29 DIAGNOSIS — F329 Major depressive disorder, single episode, unspecified: Secondary | ICD-10-CM | POA: Insufficient documentation

## 2014-08-29 DIAGNOSIS — G609 Hereditary and idiopathic neuropathy, unspecified: Secondary | ICD-10-CM | POA: Diagnosis not present

## 2014-08-29 DIAGNOSIS — Z8742 Personal history of other diseases of the female genital tract: Secondary | ICD-10-CM | POA: Insufficient documentation

## 2014-08-29 DIAGNOSIS — Z85028 Personal history of other malignant neoplasm of stomach: Secondary | ICD-10-CM | POA: Diagnosis not present

## 2014-08-29 DIAGNOSIS — Z87440 Personal history of urinary (tract) infections: Secondary | ICD-10-CM | POA: Diagnosis not present

## 2014-08-29 DIAGNOSIS — M79609 Pain in unspecified limb: Secondary | ICD-10-CM

## 2014-08-29 DIAGNOSIS — M11262 Other chondrocalcinosis, left knee: Secondary | ICD-10-CM | POA: Diagnosis not present

## 2014-08-29 DIAGNOSIS — E785 Hyperlipidemia, unspecified: Secondary | ICD-10-CM | POA: Diagnosis not present

## 2014-08-29 DIAGNOSIS — M25462 Effusion, left knee: Secondary | ICD-10-CM | POA: Diagnosis not present

## 2014-08-29 DIAGNOSIS — Z79899 Other long term (current) drug therapy: Secondary | ICD-10-CM | POA: Insufficient documentation

## 2014-08-29 DIAGNOSIS — M1009 Idiopathic gout, multiple sites: Secondary | ICD-10-CM | POA: Diagnosis not present

## 2014-08-29 DIAGNOSIS — M25569 Pain in unspecified knee: Secondary | ICD-10-CM

## 2014-08-29 DIAGNOSIS — Z8719 Personal history of other diseases of the digestive system: Secondary | ICD-10-CM | POA: Diagnosis not present

## 2014-08-29 DIAGNOSIS — Z87891 Personal history of nicotine dependence: Secondary | ICD-10-CM | POA: Insufficient documentation

## 2014-08-29 LAB — CBC WITH DIFFERENTIAL/PLATELET
BASOS ABS: 0 10*3/uL (ref 0.0–0.1)
Basophils Relative: 0 % (ref 0–1)
Eosinophils Absolute: 0.1 10*3/uL (ref 0.0–0.7)
Eosinophils Relative: 1 % (ref 0–5)
HEMATOCRIT: 40.4 % (ref 36.0–46.0)
Hemoglobin: 13.5 g/dL (ref 12.0–15.0)
LYMPHS PCT: 18 % (ref 12–46)
Lymphs Abs: 1.9 10*3/uL (ref 0.7–4.0)
MCH: 32.5 pg (ref 26.0–34.0)
MCHC: 33.4 g/dL (ref 30.0–36.0)
MCV: 97.1 fL (ref 78.0–100.0)
MONO ABS: 1.6 10*3/uL — AB (ref 0.1–1.0)
MONOS PCT: 15 % — AB (ref 3–12)
NEUTROS PCT: 66 % (ref 43–77)
Neutro Abs: 6.9 10*3/uL (ref 1.7–7.7)
Platelets: 258 10*3/uL (ref 150–400)
RBC: 4.16 MIL/uL (ref 3.87–5.11)
RDW: 15.5 % (ref 11.5–15.5)
WBC: 10.4 10*3/uL (ref 4.0–10.5)

## 2014-08-29 LAB — BASIC METABOLIC PANEL
ANION GAP: 13 (ref 5–15)
BUN: 18 mg/dL (ref 6–23)
CO2: 25 meq/L (ref 19–32)
CREATININE: 1.28 mg/dL — AB (ref 0.50–1.10)
Calcium: 10.3 mg/dL (ref 8.4–10.5)
Chloride: 100 mEq/L (ref 96–112)
GFR calc Af Amer: 43 mL/min — ABNORMAL LOW (ref >90)
GFR calc non Af Amer: 37 mL/min — ABNORMAL LOW (ref >90)
Glucose, Bld: 116 mg/dL — ABNORMAL HIGH (ref 70–99)
Potassium: 4.8 mEq/L (ref 3.7–5.3)
Sodium: 138 mEq/L (ref 137–147)

## 2014-08-29 LAB — SYNOVIAL CELL COUNT + DIFF, W/ CRYSTALS
Lymphocytes-Synovial Fld: 3 % (ref 0–20)
Monocyte-Macrophage-Synovial Fluid: 24 % — ABNORMAL LOW (ref 50–90)
Neutrophil, Synovial: 73 % — ABNORMAL HIGH (ref 0–25)
WBC, SYNOVIAL: 4105 /mm3 — AB (ref 0–200)

## 2014-08-29 LAB — GRAM STAIN

## 2014-08-29 LAB — SEDIMENTATION RATE: Sed Rate: 44 mm/hr — ABNORMAL HIGH (ref 0–22)

## 2014-08-29 LAB — C-REACTIVE PROTEIN: CRP: 2.1 mg/dL — ABNORMAL HIGH (ref <0.60)

## 2014-08-29 MED ORDER — LIDOCAINE HCL (PF) 1 % IJ SOLN
5.0000 mL | Freq: Once | INTRAMUSCULAR | Status: AC
  Administered 2014-08-29: 5 mL
  Filled 2014-08-29: qty 5

## 2014-08-29 MED ORDER — TRAMADOL HCL 50 MG PO TABS
50.0000 mg | ORAL_TABLET | Freq: Once | ORAL | Status: AC
  Administered 2014-08-29: 50 mg via ORAL
  Filled 2014-08-29: qty 1

## 2014-08-29 MED ORDER — FEBUXOSTAT 40 MG PO TABS: 40.0000 mg | ORAL_TABLET | Freq: Every day | ORAL | Status: DC

## 2014-08-29 MED ORDER — PREDNISONE 20 MG PO TABS: 40.0000 mg | ORAL_TABLET | Freq: Every day | ORAL | Status: DC

## 2014-08-29 NOTE — ED Notes (Signed)
To ED for eval of left knee swelling and pain since yesterday. Denies trauma. Pain is from knee down to foot with intermittent sharp pains. No redness noted.

## 2014-08-29 NOTE — ED Provider Notes (Signed)
CSN: [id number]     Arrival date & time 08/29/14  2536 History   First MD Initiated Contact with Patient 08/29/14 [phone]     Chief Complaint  Patient presents with  . Knee Pain     (Consider location/radiation/quality/duration/timing/severity/associated sxs/prior Treatment) HPI Comments: Patient presents to the ER for evaluation of left knee pain and swelling. Patient reports that she started to note "pulling behind her leg and foot "when she walks yesterday. This morning upon awakening she noticed that her left knee was very swollen. She denies any injury, has not twisted it or fallen. Patient reports constant moderate pain in the knee now. There is no redness or warmth. She has not had any fever. She does report that her orthopedic doctor had to drain fluid out of the knee approximately 3 months ago.  Patient is a 80 y.o. female presenting with knee pain.  Knee Pain   Past Medical History  Diagnosis Date  . ABDOMINAL PAIN, CHRONIC 04/07/2008  . ARTHRITIS 03/03/2008  . BRADYCARDIA, CHRONIC 12/17/2008  . CARDIAC MURMUR 02/01/2010  . CONSTIPATION, CHRONIC 10/15/2010  . DEGENERATIVE JOINT DISEASE, KNEES, BILATERAL 12/07/2007  . DEPRESSION 06/17/2009  . DIABETES MELLITUS, TYPE II 09/19/2007    DIET CONTROL   . DYSPHAGIA UNSPECIFIED 12/24/2009  . Gastroparesis 12/24/2009  . GENERALIZED OSTEOARTHROSIS INVOLVING HAND 10/03/2007  . GLAUCOMA 09/19/2007  . GOUTY ARTHROPATHY UNSPECIFIED 02/17/2009  . GOUT 09/19/2007  . HYPERLIPIDEMIA 01/27/2010  . HYPERTENSION 09/19/2007  . LUNG NODULE 03/03/2008  . MENOPAUSAL DISORDER 12/17/2008  . PERIPHERAL NEUROPATHY 06/19/2008  . PERSONAL HISTORY MALIGNANT NEOPLASM STOMACH 09/19/2007  . 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 STOMACH CANCER 03/03/2008  . UTI 12/15/2009  . Arthritis of knee, degenerative 10/19/2011   Past Surgical History  Procedure Laterality Date  . Vagotomy    . Partial gastrectomy    . Billroth ii gastorenterostomy     Family  History  Problem Relation Age of Onset  . Uterine cancer Mother   . Stroke Father     Hemorrhagic  . Cancer Sister     breast cancer and one sister with uterine cancer and one sister with ovarian cancerr  . Colon cancer Maternal Grandmother   . Colon cancer Paternal Grandfather   . Diabetes Other   . Stomach cancer Neg Hx    History  Substance Use Topics  . Smoking status: Former Smoker    Types: Cigarettes  . Smokeless tobacco: Never Used     Comment: use to smoke a pack of cigaretts a day for 25 yrs. but stopped 10 yrs. ago.   . Alcohol Use: No   OB History   Grav Para Term Preterm Abortions TAB SAB Ect Mult Living                 Review of Systems  Musculoskeletal: Positive for arthralgias.  All other systems reviewed and are negative.     Allergies  Voltaren; Dulcolax; Hydrocodone-acetaminophen; Penicillins; and Timolol  Home Medications   Prior to Admission medications   Medication Sig Start Date End Date Taking? Authorizing Provider  amLODipine (NORVASC) 5 MG tablet Take 1 tablet (5 mg total) by mouth daily. 02/26/14 02/26/15 Yes Biagio Borg, MD  DULoxetine (CYMBALTA) 30 MG capsule Take 1 capsule (30 mg total) by mouth daily. 06/10/14  Yes Biagio Borg, MD  ketoconazole (NIZORAL) 2 % cream Apply 1 application topically 2 (two) times daily as needed.  05/20/14  Yes Historical Provider, MD  latanoprost (XALATAN) 0.005 % ophthalmic solution Place 1 drop into both eyes at bedtime.  07/28/14  Yes Historical Provider, MD  losartan (COZAAR) 100 MG tablet Take 100 mg by mouth daily.   Yes Historical Provider, MD  omeprazole (PRILOSEC) 40 MG capsule Take 1 capsule (40 mg total) by mouth daily. 08/15/14  Yes Milus Banister, MD  pravastatin (PRAVACHOL) 10 MG tablet Take 10 mg by mouth every evening.   Yes Historical Provider, MD  tiZANidine (ZANAFLEX) 4 MG tablet Take 4 mg by mouth every 6 (six) hours as needed for muscle spasms.   Yes Historical Provider, MD  traMADol (ULTRAM)  50 MG tablet Take 50 mg by mouth every 6 (six) hours as needed for moderate pain.   Yes Historical Provider, MD  febuxostat (ULORIC) 40 MG tablet Take 1 tablet (40 mg total) by mouth daily. 08/29/14   Orpah Greek, MD  glucose blood (FREESTYLE LITE) test strip Use as instructed 04/29/14   Biagio Borg, MD  predniSONE (DELTASONE) 20 MG tablet Take 2 tablets (40 mg total) by mouth daily with breakfast. 08/29/14   Orpah Greek, MD   BP 147/59  Pulse 55  Temp(Src) 98 F (36.7 C) (Oral)  Resp 16  SpO2 96% Physical Exam  Constitutional: She is oriented to person, place, and time. She appears well-developed and well-nourished. No distress.  HENT:  Head: Normocephalic and atraumatic.  Right Ear: Hearing normal.  Left Ear: Hearing normal.  Nose: Nose normal.  Mouth/Throat: Oropharynx is clear and moist and mucous membranes are normal.  Eyes: Conjunctivae and EOM are normal. Pupils are equal, round, and reactive to light.  Neck: Normal range of motion. Neck supple.  Cardiovascular: Regular rhythm, S1 normal and S2 normal.  Exam reveals no gallop and no friction rub.   No murmur heard. Pulses:      Dorsalis pedis pulses are 1+ on the right side, and 1+ on the left side.  Pulmonary/Chest: Effort normal and breath sounds normal. No respiratory distress. She exhibits no tenderness.  Abdominal: Soft. Normal appearance and bowel sounds are normal. There is no hepatosplenomegaly. There is no tenderness. There is no rebound, no guarding, no tenderness at McBurney's point and negative Murphy's sign. No hernia.  Musculoskeletal: Normal range of motion.       Left knee: She exhibits swelling and effusion. She exhibits normal range of motion, no ecchymosis and no erythema.  Neurological: She is alert and oriented to person, place, and time. She has normal strength. No cranial nerve deficit or sensory deficit. Coordination normal. GCS eye subscore is 4. GCS verbal subscore is 5. GCS motor  subscore is 6.  Skin: Skin is warm, dry and intact. No rash noted. No cyanosis or erythema.  Psychiatric: She has a normal mood and affect. Her speech is normal and behavior is normal. Thought content normal.    ED Course  Procedures (including critical care time)  Procedure: Arthrocentesis (Area marked. Timeout immediately before procedure. Patient identification by medical record number.)  The knee was positioned slightly bent and the area was prepped with Betadine and sterilely draped. Landmarks were identified, a medial approach was used. 1% lidocaine was injected under the skin to raise a large skin wheal for skin anesthesia. An 18-gauge needle was then advanced through the anesthetized skin with constant aspiration on a 66mL syringe. The needle was advanced into the joint capsule and synovial fluid was aspirated until there was no more return. 60 mL of serosanguineous fluid was  aspirated from the joint. The needle was removed and a sterile dressing was held in place until bleeding stopped, and the Band-Aid was placed over the site. The patient tolerated this procedure well. There were no complications.  Labs Review Labs Reviewed  CBC WITH DIFFERENTIAL - Abnormal; Notable for the following:    Monocytes Relative 15 (*)    Monocytes Absolute 1.6 (*)    All other components within normal limits  BASIC METABOLIC PANEL - Abnormal; Notable for the following:    Glucose, Bld 116 (*)    Creatinine, Ser 1.28 (*)    GFR calc non Af Amer 37 (*)    GFR calc Af Amer 43 (*)    All other components within normal limits  SYNOVIAL CELL COUNT + DIFF, W/ CRYSTALS - Abnormal; Notable for the following:    Appearance-Synovial CLOUDY (*)    WBC, Synovial 4105 (*)    Neutrophil, Synovial 73 (*)    Monocyte-Macrophage-Synovial Fluid 24 (*)    All other components within normal limits  SEDIMENTATION RATE - Abnormal; Notable for the following:    Sed Rate 44 (*)    All other components within normal  limits  GRAM STAIN  BODY FLUID CULTURE  C-REACTIVE PROTEIN    Imaging Review Dg Knee Complete 4 Views Left  08/29/2014   CLINICAL DATA:  Left knee pain for 2 days.  No known injury.  EXAM: LEFT KNEE - COMPLETE 4+ VIEW  COMPARISON:  None.  FINDINGS: Advanced tricompartment degenerative changes noted within the left knee. Chondrocalcinosis in the medial compartment. Large joint effusion. Irregularity within the lateral tibial plateau and lateral compartment is felt to be degenerative in nature. No acute fracture, subluxation or dislocation.  IMPRESSION: Advanced tricompartment degenerative changes. Large joint effusion. Chondrocalcinosis. No acute findings.   Electronically Signed   By: Rolm Baptise M.D.   On: 08/29/2014 11:43     EKG Interpretation None      MDM   Final diagnoses:  Knee pain  Pseudogout of knee, left    Patient presents to the ER for evaluation of pain and swelling of the left knee. Patient did not have any overlying erythema or warmth. She denied injury. X-ray showed severe tricompartmental degenerative changes and effusion, no acute findings. Venous duplex showed no evidence of Baker's cyst or DVT. Labs unremarkable.  Centesis was performed. Findings are compatible with CPPD. I discussed her case with Dr. Hulan Saas, who manages her gout and arthritis. He tells me that he has not done a steroid injection since 2008, but did not recommend injection at this time. He recommended starting Uloric 40 mg a day and prednisone 40 mg daily for 5 days. Patient has not had hyperglycemia and her last hemoglobin A1c was 5.9, he felt that administering oral prednisone would be reasonable. He will see the patient in the office for recheck including LFTs (due to Uloric).   Orpah Greek, MD 08/29/14 1537

## 2014-08-29 NOTE — ED Notes (Signed)
Patient transported to Ultrasound 

## 2014-08-29 NOTE — Discharge Instructions (Signed)
Pseudogout Pseudogout is similar to gout. It is an arthritis that causes pain, swelling, and inflammation in a joint. This is due to the presence of calcium pyrophosphate crystals in the joint fluid. This is a different type of crystal than the crystals that cause gout. The joint pain can be severe and may last for days. In some cases, it may last much longer and can mimic rheumatoid arthritis or osteoarthritis. CAUSES  The exact cause of the disease is not known. It develops when crystals of calcium pyrophosphate build up in a joint. These crystals cause inflammation that leads to pain and swelling of the joint.  Chances of developing pseudogout increase with age. It often follows a minor injury.  The condition may be passed down from parent to child (hereditary).  Events such as strokes, heart attacks, or surgery may increase the risk of pseudogout.  Pseudogout can be associated with other disorders (hemophilia, ochronosis, amyloidosis, or hormonal disorders).  Pseudogout can be associated with dehydration, especially following surgery or hospitalization. Patients with known pseudogout should stay well hydrated before and after surgery. SYMPTOMS   Intense, constant pain in one joint that seems to come on for no reason.  The joint area may be hot to the touch, red, swollen, and stiff.  Pain may last from several days to a few weeks. It may then disappear. Later, it may start again, possibly in a different joint.  Pseudogout usually affects the knees. It can also affect the wrists, elbows, shoulders, and ankles. DIAGNOSIS  The diagnosis is often suggested by your exam or is suspected when an abnormal buildup of calcium salts (calcifications) are seen in the cartilage of joints on X-rays. The final diagnosis is made when fluid from the joint is examined under a special microscope used to find calcium pyrophosphate crystals. The crystals of pseudogout and gout may both be present at the same  time. TREATMENT  Nonsteroidal anti-inflammatory drugs (NSAIDs), such as naproxen, treat the pain. Identifying the trigger of pseudogout and treating the underlying cause, such as dehydration, is also important. There is no way to remove the crystals themselves. HOME CARE INSTRUCTIONS   Put ice on the sore joint.  Put ice in a plastic bag.  Place a towel between your skin and the bag.  Leave the ice on for 15-20 minutes at a time, 03-04 times a day, for the first 2 days.  Keep your affected joints raised (elevated) when possible to lessen swelling.  Use crutches (non-weight bearing) as needed. Walk without crutches as the pain allows, or as instructed. Gradually, start bearing weight.  Only take over-the-counter or prescription medicines for pain, discomfort, or fever as directed by your caregiver.  Once you recover from the painful attack, exercise regularly to keep your muscle strength. Not using a sore joint will cause the muscles around it to become weak. This may increase pain. Low-impact exercises, such as swimming, bicycling, water aerobics, and walking, may be best. This will give you energy, strengthen your heart, help you control your weight, and improve your well-being.  Maintain a healthy weight so your joints do not need to bear more weight than necessary. SEEK MEDICAL CARE IF:   You have an increase in joint pain not relieved with medicine.  You have a fever.  You have more serious symptoms such as skin rash, diarrhea, vomiting, headache, or other joint pains. FOR MORE INFORMATION  Arthritis Foundation: www.arthritis.org National Institute of Arthritis and Musculoskeletal and Skin Diseases: www.niams.nih.gov Document Released: 07/09/2004   Document Revised: 01/09/2012 Document Reviewed: 01/29/2010 ExitCare Patient Information 2015 [employer]. This information is not intended to replace advice given to you by your health care provider. Make sure you discuss any  questions you have with your health care provider.  

## 2014-08-29 NOTE — ED Notes (Signed)
PAtient comfortable with discharge and follow up instructions. Prescriptions x2.  Pt's son assisting patient back to assisted living.

## 2014-08-29 NOTE — Progress Notes (Signed)
*  PRELIMINARY RESULTS* Vascular Ultrasound Left lower extremity venous duplex has been completed.  Preliminary findings: no evidence of DVT or baker's cyst.   Landry Mellow, RDMS, RVT  08/29/2014, 11:15 AM

## 2014-09-02 ENCOUNTER — Encounter: Payer: Self-pay | Admitting: Family Medicine

## 2014-09-02 ENCOUNTER — Ambulatory Visit (INDEPENDENT_AMBULATORY_CARE_PROVIDER_SITE_OTHER): Payer: Medicare Other | Admitting: Family Medicine

## 2014-09-02 ENCOUNTER — Other Ambulatory Visit: Payer: Self-pay | Admitting: Family Medicine

## 2014-09-02 VITALS — BP 136/70 | HR 67 | Ht 66.0 in | Wt 165.0 lb

## 2014-09-02 DIAGNOSIS — N189 Chronic kidney disease, unspecified: Secondary | ICD-10-CM

## 2014-09-02 DIAGNOSIS — M1A3621 Chronic gout due to renal impairment, left knee, with tophus (tophi): Secondary | ICD-10-CM

## 2014-09-02 DIAGNOSIS — M1710 Unilateral primary osteoarthritis, unspecified knee: Secondary | ICD-10-CM

## 2014-09-02 LAB — BODY FLUID CULTURE: CULTURE: NO GROWTH

## 2014-09-02 MED ORDER — FEBUXOSTAT 40 MG PO TABS: 40.0000 mg | ORAL_TABLET | Freq: Every day | ORAL | Status: DC

## 2014-09-02 NOTE — Assessment & Plan Note (Signed)
Discussed with patient at length. Discussed that I would like to try some other different medications first but because the patient's other comorbidities steroid injection intra-articularly likely would be the safest for her. Patient will monitor her blood levels. I do believe the patient is likely not feeling well but I do not see any signs of infection at this time. Discussed avoiding any anti-inflammatories over-the-counter at all costs. Discussed icing of the knee. Discussed secondary to patient's severe osteoarthritic changes of this knee think the only way patient will feel better is with a need joint replacement. Once again secondary to history of her sister dying from a cerebrovascular accident after knee replacement patient is very concerned and she states that her family is even more concern. Patient though is having difficulty even with angulation and daily activities that I do not think our conservative therapy will be very beneficial. Patient is no longer able to have another corticosteroid injection for another 8 weeks. Discuss that we cannot due to viscous supplementation until February. If patient has any worsening pain I would highly encourage surgical intervention and will discuss with family if they ever came to an office visit.

## 2014-09-02 NOTE — Assessment & Plan Note (Signed)
Discussed with patient that this is likely the exacerbation of her knee. Patient was given an injection today but we do need to decrease the amount of uric acid and crystal formation that is interbody. Patient will do colchicine low dose daily and have her come back in 2 weeks to recheck her basic metabolic panel. Patient warned the potential side effects and went to seek medical attention. In addition to that I would like to start patient on Uloric. We will do any prior authorization with patient having severe chronic kidney disease.patient will start both of these medications as suggested. Patient is going to come back in 2 weeks to make sure she is doing better.

## 2014-09-02 NOTE — Progress Notes (Signed)
[name] [address], [place] [address] Phone: [phone] Subjective:      CC:  Bilateral knee pain followup  KCL:[id number] Shari Mcmahon is a 80 y.o. female coming in for followup for bilateral knee pain. Patient was seen in the emergency department one day ago but due to severe pain. Patient does have end-stage bone-on-bone osteoarthritis and is in need of surgical repair but due to patient's past medical history in her family of her sister not doing well she is not wanting to have surgical intervention. When patient was in the emergency department patient did have aspiration of the knee but did have a cloudy appearance. Laboratory results were reviewed by me and shows the patient did have more of a gout exacerbation versus CPPD. Patient has had significant other difficulties recently including of chronic abdominal pain and has undergone an EGD showing significant gastritis. Patient has had some of her medications changed recently as well. Patient states that unfortunately her knee pain continues to be painful. Patient had an aspiration but no steroid injection. Patient actually had her last steroid injection 8 weeks ago. Patient states that after the aspiration she is feeling about 50% better. Patient was given medications in the emergency department the patient has not taken them yet. Patient is having difficulty with ambulation and is walking with a walker. Patient states that she has a dull throbbing pain even when she has not walking. Patient has not changed her diet recently and denies any fevers or chills. Patient has had weight loss recently.  Wt Readings from Last 3 Encounters:  09/02/14 165 lb (74.844 kg)  08/15/14 171 lb (77.565 kg)  08/13/14 168 lb 12.8 oz (76.567 kg)       Past medical history, social, surgical and family history all reviewed in electronic medical record.   Review of Systems: No headache, visual changes, nausea,  vomiting, diarrhea, constipation, dizziness, abdominal pain, skin rash, fevers, chills, night sweats, weight loss, swollen lymph nodes, body aches, joint swelling, muscle aches, chest pain, shortness of breath, mood changes.   Objective Blood pressure 136/70, pulse 67, height 5\' 6"  (1.676 m), weight 165 lb (74.844 kg), SpO2 99 %.  General: No apparent distress alert and oriented x3 mood and affect normal, dressed appropriately.  HEENT: Pupils equal, extraocular movements intact  Respiratory: Patient's speak in full sentences and does not appear short of breath  Cardiovascular: No lower extremity edema, non tender, no erythema  Skin: Warm dry intact with no signs of infection or rash on extremities or on axial skeleton.  Abdomen: Soft nontender  Neuro: Cranial nerves II through XII are intact, neurovascularly intact in all extremities with 2+ DTRs and 2+ pulses.  Lymph: No lymphadenopathy of posterior or anterior cervical chain or axillae bilaterally.  Gait normal with good balance and coordination.  MSK: Non tender with full range of motion and good stability and symmetric strength and tone of elbows, wrist, knee and ankles bilaterally. Significant osteoarthritic changes in multiple joints.   Knee: Bilateral On standing patient does have a valgus deformity of the left knee compared to the right knee.Still moderate swelling of the left kneeSevere tenderness to palpation on the medial joint lines bilaterally ROM full in flexion and extension and lower leg rotation. Ligaments with solid consistent endpoints including ACL, PCL, LCL, MCL.  painful patellar compression. Patellar glide with severe crepitus. Patellar and quadriceps tendons unremarkable. Hamstring and quadriceps strength is normal.  Contralateral knee has some tenderness to  palpation over the medial joint line as well as mild decreased range of motion.   After informed written and verbal consent, patient was seated on exam table.  Left knee was prepped with alcohol swab and utilizing anterolateral approach, patient's left knee space was injected with 4:1  marcaine 0.5%: Kenalog 40mg /dL. Patient tolerated the procedure well without immediate complications.     Impression and Recommendations:     This case required medical decision making of moderate complexity.

## 2014-09-02 NOTE — Patient Instructions (Addendum)
I do not see sign of an infection but it likely was your gout Take colcrys twice daily for 3 days then daily thereafter.  The start uloric 1 pill daily.  I want you to come back in 2 weeks and we need to check your kidneys at that time.   Then see me in 2 weeks and if knee is hurting we can do injection.  Otherwise I can talk to your daughter about a knee replacement which is probably best for you.  See you in 2 weeks

## 2014-09-05 ENCOUNTER — Other Ambulatory Visit: Payer: Self-pay | Admitting: Internal Medicine

## 2014-09-15 ENCOUNTER — Telehealth: Payer: Self-pay | Admitting: Gastroenterology

## 2014-09-15 NOTE — Telephone Encounter (Signed)
The pt is getting worse and has early satiety. She gets full after one bite and is bloated and gassy all the time.  Appt sch with Nevin Bloodgood for 09/16/14.  Pt agrees

## 2014-09-15 NOTE — Telephone Encounter (Signed)
Pt is returning your call

## 2014-09-16 ENCOUNTER — Encounter: Payer: Self-pay | Admitting: Nurse Practitioner

## 2014-09-16 ENCOUNTER — Ambulatory Visit (INDEPENDENT_AMBULATORY_CARE_PROVIDER_SITE_OTHER): Payer: Medicare Other | Admitting: Nurse Practitioner

## 2014-09-16 VITALS — BP 112/68 | HR 60 | Ht 66.0 in | Wt 161.2 lb

## 2014-09-16 DIAGNOSIS — R634 Abnormal weight loss: Secondary | ICD-10-CM

## 2014-09-16 DIAGNOSIS — R1310 Dysphagia, unspecified: Secondary | ICD-10-CM

## 2014-09-16 NOTE — Progress Notes (Signed)
     History of Present Illness:   Patient is an 80 year old female with a  history of gastric cancer 2005. I saw her late September for solid food dysphagia. A barium swallow was done and showed mild narrowing of the distal esophagus.. Patient subsequently underwent EGD which showed a mildly erythematous Billroth II anastomosis.  Gastric remnant biopsies compatible with reactive gastropathy, no H. Pylori.   Patient is back today with ongoing swallowing problems. Her weight is down 10 pounds since her visit here late September. She clearly describes dysphagia, not early satiety, not nausea / vomiting. After a couple of bites of food she is unable to tolerate any other solids from a swallowing standpoint. It feels like food is piling up in esophagus. Liquids are okay. She can tolerate cream based soups   Current Medications, Allergies, Past Medical History, Past Surgical History, Family History and Social History were reviewed in [employer] record.  Physical Exam: General: Pleasant, well developed , black female in no acute distress Head: Normocephalic and atraumatic Eyes:  sclerae anicteric, conjunctiva pink  Ears: Normal auditory acuity Lungs: Clear throughout to auscultation Neurological: Alert oriented x 4, grossly nonfocal Psychological:  Alert and cooperative. Normal mood and affect  Assessment and Recommendations:   84. 80 year old female with several week history of solid food dysphagia of unclear etiology. Workup including a barium swallow and upper endoscopy have been nondiagnostic. Patient has lost 10 pounds since late September. Will obtain a modified barium swallow study for further evaluation  2. History of gastric cancer, no evidence for recurrence on recent upper endoscopy

## 2014-09-16 NOTE — Patient Instructions (Signed)
You have been scheduled for a modified barium swallow on 10/01/2015 at 2:00pm. Please arrive 15 minutes prior to your test for registration. You will go to [facility] (1st Floor) for your appointment.  Should you need to cancel or reschedule your appointment, please contact [phone] [place]) or [phone] [place]). _____________________________________________________________________ A Modified Barium Swallow Study, or MBS, is a special x-ray that is taken to check swallowing skills. It is carried out by a Stage manager and a Psychologist, clinical (SLP). During this test, yourmouth, throat, and esophagus, a muscular tube which connects your mouth to your stomach, is checked. The test will help you, your doctor, and the SLP plan what types of foods and liquids are easier for you to swallow. The SLP will also identify positions and ways to help you swallow more easily and safely. What will happen during an MBS? You will be taken to an x-ray room and seated comfortably. You will be asked to swallow small amounts of food and liquid mixed with barium. Barium is a liquid or paste that allows images of your mouth, throat and esophagus to be seen on x-ray. The x-ray captures moving images of the food you are swallowing as it travels from your mouth through your throat and into your esophagus. This test helps identify whether food or liquid is entering your lungs (aspiration). The test also shows which part of your mouth or throat lacks strength or coordination to move the food or liquid in the right direction. This test typically takes 30 minutes to 1 hour to complete. _______________________________________________________________________

## 2014-09-17 ENCOUNTER — Other Ambulatory Visit (HOSPITAL_COMMUNITY): Payer: Self-pay | Admitting: Nurse Practitioner

## 2014-09-17 ENCOUNTER — Ambulatory Visit: Payer: Medicare Other | Admitting: Family Medicine

## 2014-09-17 DIAGNOSIS — R634 Abnormal weight loss: Secondary | ICD-10-CM | POA: Insufficient documentation

## 2014-09-17 DIAGNOSIS — R1314 Dysphagia, pharyngoesophageal phase: Secondary | ICD-10-CM

## 2014-09-17 NOTE — Progress Notes (Signed)
I agree with the note, plan above 

## 2014-09-19 ENCOUNTER — Telehealth: Payer: Self-pay | Admitting: Internal Medicine

## 2014-09-19 NOTE — Telephone Encounter (Signed)
Daughter requesting thyroid panel for her mother, she thinks she may have thyroid issues, i.e trouble swallowing.

## 2014-09-22 NOTE — Telephone Encounter (Signed)
Pt already has labs ordered in the computer, ok to have these done at pt convenience

## 2014-09-23 NOTE — Telephone Encounter (Signed)
Daughter informed labs in the computer and to bring her mother at convenience to the lab.

## 2014-09-30 ENCOUNTER — Ambulatory Visit (HOSPITAL_COMMUNITY)
Admission: RE | Admit: 2014-09-30 | Discharge: 2014-09-30 | Disposition: A | Discharge status: Home / Self Care | Payer: Medicare Other | Source: Ambulatory Visit | Attending: Nurse Practitioner | Admitting: Nurse Practitioner

## 2014-09-30 DIAGNOSIS — M1990 Unspecified osteoarthritis, unspecified site: Secondary | ICD-10-CM | POA: Insufficient documentation

## 2014-09-30 DIAGNOSIS — E119 Type 2 diabetes mellitus without complications: Secondary | ICD-10-CM | POA: Diagnosis not present

## 2014-09-30 DIAGNOSIS — K3184 Gastroparesis: Secondary | ICD-10-CM | POA: Insufficient documentation

## 2014-09-30 DIAGNOSIS — R634 Abnormal weight loss: Secondary | ICD-10-CM | POA: Diagnosis not present

## 2014-09-30 DIAGNOSIS — R1310 Dysphagia, unspecified: Secondary | ICD-10-CM | POA: Diagnosis present

## 2014-09-30 DIAGNOSIS — C169 Malignant neoplasm of stomach, unspecified: Secondary | ICD-10-CM | POA: Insufficient documentation

## 2014-09-30 DIAGNOSIS — R1314 Dysphagia, pharyngoesophageal phase: Secondary | ICD-10-CM

## 2014-09-30 NOTE — Procedures (Signed)
Objective Swallowing Evaluation: Modified Barium Swallowing Study  Patient Details  Name: Shari Mcmahon MRN: [id number] Date of Birth: 02/15/1927  Today's Date: 09/30/2014 Time: 1400-1437 SLP Time Calculation (min) (ACUTE ONLY): 37 min  Past Medical History:  Past Medical History  Diagnosis Date  . ABDOMINAL PAIN, CHRONIC 04/07/2008  . ARTHRITIS 03/03/2008  . BRADYCARDIA, CHRONIC 12/17/2008  . CARDIAC MURMUR 02/01/2010  . CONSTIPATION, CHRONIC 10/15/2010  . DEGENERATIVE JOINT DISEASE, KNEES, BILATERAL 12/07/2007  . DEPRESSION 06/17/2009  . DIABETES MELLITUS, TYPE II 09/19/2007    DIET CONTROL   . DYSPHAGIA UNSPECIFIED 12/24/2009  . Gastroparesis 12/24/2009  . GENERALIZED OSTEOARTHROSIS INVOLVING HAND 10/03/2007  . GLAUCOMA 09/19/2007  . GOUTY ARTHROPATHY UNSPECIFIED 02/17/2009  . GOUT 09/19/2007  . HYPERLIPIDEMIA 01/27/2010  . HYPERTENSION 09/19/2007  . LUNG NODULE 03/03/2008  . MENOPAUSAL DISORDER 12/17/2008  . PERIPHERAL NEUROPATHY 06/19/2008  . PERSONAL HISTORY MALIGNANT NEOPLASM STOMACH 09/19/2007  . 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 STOMACH CANCER 03/03/2008  . UTI 12/15/2009  . Arthritis of knee, degenerative 10/19/2011   Past Surgical History:  Past Surgical History  Procedure Laterality Date  . Vagotomy    . Partial gastrectomy    . Billroth ii gastorenterostomy     HPI:  80 yo female referred by GI MD for MBSS due to concerns for dysphagia and pt's weight loss.  Pt with PMH + for gastric cancer, DM, arthritis.  She denies neurological hx.  Pt reports dysphagia symptoms have been occuring for several weeks with sudden onset.  Pt has lost 12 pounds recently per her daughter's report which both attribute to her dysphagia symptoms.  Pt states Ensure drinks clear well but water and food feels as though it lodges with pt pointing to proximal esophagus.   She states symptoms occurs if she consumes more than 2 bites of foods/sips of water and nothing helps it to do down  causing her regurgitate it.  Therefore pt intake has consisted mostly of liquid Ensure except some pt preferred sausage.    Pt denies pill dysphagia. She also admits to trying to consume more water due to dehydration and UTI concerns.  Recent EGD was negative but pt states she had 3 stents placed and she reports no improvement with swallowing.   Per review of GI Md report, pt had 3 endoclips placed.       Assessment / Plan / Recommendation Clinical Impression  Dysphagia Diagnosis: Within Functional Limits  Clinical impression: Pt with functional oropharyngeal swallow ability without aspiration.   Trace laryngeal penetration of thin liquid x1 during consumption of sequential boluses of thin via cup that cleared with furhter swallows.  Swallow was timely and strong with only trace residuals in pharynx of liquids.  Appearance of prominent cricopharyngeus noted x1 with consumption of nectar that did not restrict barium flow.    Dysphagia symptoms were not reproduced during today's MBSS.  Advised pt and daughter to negative MBSS results and advised to compensation strategies that may be helpful to mitigate her dysphagia symptoms.  Thanks for this referral.      Treatment Recommendation  No treatment recommended at this time    Diet Recommendation Regular;Thin liquid   Medication Administration: Whole meds with liquid Supervision: Patient able to self feed Compensations: Slow rate;Small sips/bites (consume liquids t/o meal) Postural Changes and/or Swallow Maneuvers: Seated upright 90 degrees;Upright 30-60 min after meal    Other  Recommendations Oral Care Recommendations: Oral care BID   Follow Up Recommendations  None  General Date of Onset: 09/30/14 HPI: 80 yo female referred by GI MD for MBSS due to concerns for dysphagia and pt's weight loss.  Pt with PMH + for gastric cancer, DM, arthritis.  She denies neurological hx.  Pt reports dysphagia symptoms have been occuring for several weeks  with sudden onset.  Pt has lost 12 pounds recently per her daughter's report which both attribute to her dysphagia symptoms.  Pt states Ensure drinks clear well but water and food feels as though it lodges with pt pointing to proximal esophagus.   She states symptoms occurs if she consumes more than 2 bites of foods/sips of water and nothing helps it to do down causing her regurgitate it.  Therefore pt intake has consisted mostly of liquid Ensure except some pt preferred sausage.    Pt denies pill dysphagia. She also admits to trying to consume more water due to dehydration and UTI concerns.  Recent EGD was negative but pt states she had 3 stents placed and she reports no improvement with swallowing.   Per review of GI Md report, pt had 3 endoclips placed.   Type of Study: Modified Barium Swallowing Study Reason for Referral: Objectively evaluate swallowing function Previous Swallow Assessment: Recent EGD negative.  Recent esophagram showed - Mild relative narrowing of the distal esophagus which persists throughout the examination most concerning for a mild stricture, but does not restrict the passage of a 13 mm barium tablet.  Tertiary contractions just superior to the stricture with a feline appearance of the esophagus as can be seen with gastroesophageal reflux. No gastroesophageal reflux was observed.   Diet Prior to this Study: Regular;Thin liquids Temperature Spikes Noted: No Respiratory Status: Room air History of Recent Intubation: No Behavior/Cognition: Alert;Cooperative Oral Cavity - Dentition: Dentures, top;Adequate natural dentition Oral Motor / Sensory Function: Within functional limits (no focal cranial nerve deficits) Self-Feeding Abilities: Able to feed self Patient Positioning: Upright in bed Baseline Vocal Quality: Clear Volitional Cough: Strong Volitional Swallow: Able to elicit Anatomy: Within functional limits    Reason for Referral Objectively evaluate swallowing function    Oral Phase Oral Preparation/Oral Phase Oral Phase: WFL Oral - Nectar Oral - Nectar Cup: Within functional limits Oral - Thin Oral - Thin Cup: Within functional limits Oral - Thin Straw: Within functional limits Oral - Solids Oral - Puree: Within functional limits Oral - Regular: Within functional limits   Pharyngeal Phase Pharyngeal Phase Pharyngeal Phase: Impaired Pharyngeal - Nectar Pharyngeal - Nectar Cup: Pharyngeal residue - valleculae Pharyngeal - Thin Pharyngeal - Thin Cup: Pharyngeal residue - valleculae;Penetration/Aspiration during swallow Penetration/Aspiration details (thin cup): Material enters airway, remains ABOVE vocal cords then ejected out Pharyngeal - Thin Straw: Pharyngeal residue - valleculae Pharyngeal - Solids Pharyngeal - Puree: Within functional limits Pharyngeal - Regular: Within functional limits  Cervical Esophageal Phase    GO    Cervical Esophageal Phase Cervical Esophageal Phase: Impaired Cervical Esophageal Phase - Nectar Nectar Cup: Prominent cricopharyngeal segment Cervical Esophageal Phase - Comment Cervical Esophageal Comment: appearance of delayed clearance distally in esophagus (after puree) without pt sensation, consumption of thin liquid possibly aided clearance as upon esophageal sweep at end of exam pt's esophagus appeared clear    Functional Assessment Tool Used: MBS, Clinical judgement Functional Limitations: Swallowing Swallow Current Status (W0981): At least 1 percent but less than 20 percent impaired, limited or restricted Swallow Goal Status [phone]): At least 1 percent but less than 20 percent impaired, limited or restricted Swallow Discharge Status [phone]): At  least 1 percent but less than 20 percent impaired, limited or restricted    [name], [facility] SLP [phone]

## 2014-10-17 ENCOUNTER — Other Ambulatory Visit: Payer: Self-pay | Admitting: *Deleted

## 2014-10-17 MED ORDER — FEBUXOSTAT 40 MG PO TABS: 40.0000 mg | ORAL_TABLET | Freq: Every day | ORAL | Status: DC

## 2014-10-17 NOTE — Telephone Encounter (Signed)
Refill of uloric 40mg  sent into pt's pharmacy.

## 2014-10-18 ENCOUNTER — Other Ambulatory Visit: Payer: Self-pay | Admitting: Internal Medicine

## 2014-10-20 ENCOUNTER — Other Ambulatory Visit: Payer: Self-pay | Admitting: Internal Medicine

## 2014-10-20 ENCOUNTER — Telehealth: Payer: Self-pay | Admitting: Gastroenterology

## 2014-10-21 NOTE — Telephone Encounter (Signed)
The pt declined to accept any extender appt's she will wait for Dr Ardis Hughs appt, she will call with any further concerns

## 2014-10-28 ENCOUNTER — Emergency Department (HOSPITAL_COMMUNITY)
Admission: EM | Admit: 2014-10-28 | Discharge: 2014-10-28 | Disposition: A | Discharge status: Home / Self Care | Payer: Medicare Other | Attending: Emergency Medicine | Admitting: Emergency Medicine

## 2014-10-28 ENCOUNTER — Other Ambulatory Visit (INDEPENDENT_AMBULATORY_CARE_PROVIDER_SITE_OTHER): Payer: Medicare Other

## 2014-10-28 ENCOUNTER — Encounter: Payer: Self-pay | Admitting: Gastroenterology

## 2014-10-28 ENCOUNTER — Ambulatory Visit (INDEPENDENT_AMBULATORY_CARE_PROVIDER_SITE_OTHER): Payer: Medicare Other | Admitting: Gastroenterology

## 2014-10-28 ENCOUNTER — Encounter (HOSPITAL_COMMUNITY): Payer: Self-pay | Admitting: *Deleted

## 2014-10-28 VITALS — BP 120/60 | HR 60 | Ht 66.0 in | Wt 158.0 lb

## 2014-10-28 DIAGNOSIS — R011 Cardiac murmur, unspecified: Secondary | ICD-10-CM | POA: Diagnosis not present

## 2014-10-28 DIAGNOSIS — Z8669 Personal history of other diseases of the nervous system and sense organs: Secondary | ICD-10-CM | POA: Insufficient documentation

## 2014-10-28 DIAGNOSIS — Z79899 Other long term (current) drug therapy: Secondary | ICD-10-CM | POA: Insufficient documentation

## 2014-10-28 DIAGNOSIS — E785 Hyperlipidemia, unspecified: Secondary | ICD-10-CM | POA: Diagnosis not present

## 2014-10-28 DIAGNOSIS — E119 Type 2 diabetes mellitus without complications: Secondary | ICD-10-CM | POA: Insufficient documentation

## 2014-10-28 DIAGNOSIS — Z85028 Personal history of other malignant neoplasm of stomach: Secondary | ICD-10-CM | POA: Diagnosis not present

## 2014-10-28 DIAGNOSIS — Z8709 Personal history of other diseases of the respiratory system: Secondary | ICD-10-CM | POA: Diagnosis not present

## 2014-10-28 DIAGNOSIS — Z87891 Personal history of nicotine dependence: Secondary | ICD-10-CM | POA: Insufficient documentation

## 2014-10-28 DIAGNOSIS — M25569 Pain in unspecified knee: Secondary | ICD-10-CM | POA: Diagnosis present

## 2014-10-28 DIAGNOSIS — M25561 Pain in right knee: Secondary | ICD-10-CM | POA: Diagnosis not present

## 2014-10-28 DIAGNOSIS — I10 Essential (primary) hypertension: Secondary | ICD-10-CM | POA: Diagnosis not present

## 2014-10-28 DIAGNOSIS — F329 Major depressive disorder, single episode, unspecified: Secondary | ICD-10-CM | POA: Insufficient documentation

## 2014-10-28 DIAGNOSIS — H409 Unspecified glaucoma: Secondary | ICD-10-CM | POA: Insufficient documentation

## 2014-10-28 DIAGNOSIS — R1314 Dysphagia, pharyngoesophageal phase: Secondary | ICD-10-CM

## 2014-10-28 DIAGNOSIS — Z8719 Personal history of other diseases of the digestive system: Secondary | ICD-10-CM | POA: Insufficient documentation

## 2014-10-28 DIAGNOSIS — Z8742 Personal history of other diseases of the female genital tract: Secondary | ICD-10-CM | POA: Insufficient documentation

## 2014-10-28 DIAGNOSIS — Z8739 Personal history of other diseases of the musculoskeletal system and connective tissue: Secondary | ICD-10-CM

## 2014-10-28 DIAGNOSIS — M25562 Pain in left knee: Secondary | ICD-10-CM | POA: Insufficient documentation

## 2014-10-28 DIAGNOSIS — R634 Abnormal weight loss: Secondary | ICD-10-CM

## 2014-10-28 DIAGNOSIS — Z87440 Personal history of urinary (tract) infections: Secondary | ICD-10-CM | POA: Insufficient documentation

## 2014-10-28 DIAGNOSIS — Z880 Allergy status to penicillin: Secondary | ICD-10-CM | POA: Insufficient documentation

## 2014-10-28 DIAGNOSIS — M25461 Effusion, right knee: Secondary | ICD-10-CM | POA: Diagnosis not present

## 2014-10-28 LAB — CREATININE, SERUM: Creatinine, Ser: 1.3 mg/dL — ABNORMAL HIGH (ref 0.4–1.2)

## 2014-10-28 LAB — BUN: BUN: 21 mg/dL (ref 6–23)

## 2014-10-28 MED ORDER — OXYCODONE-ACETAMINOPHEN 5-325 MG PO TABS: 1.0000 | ORAL_TABLET | Freq: Four times a day (QID) | ORAL | Status: DC | PRN

## 2014-10-28 MED ORDER — PREDNISONE 20 MG PO TABS: ORAL_TABLET | ORAL | Status: DC

## 2014-10-28 NOTE — Discharge Instructions (Signed)
Ice and elevate knee throughout the day. Alternate between ibuprofen and norco for pain relief, or use your home ultram instead. Do not drive or operate machinery with pain medication use. Use prednisone as directed to help with your flare. Follow up with your regular orthopedist. Return to the ER for changes or worsening symptoms.   Arthritis, Nonspecific Arthritis is inflammation of a joint. This usually means pain, redness, warmth or swelling are present. One or more joints may be involved. There are a number of types of arthritis. Your caregiver may not be able to tell what type of arthritis you have right away. CAUSES  The most common cause of arthritis is the wear and tear on the joint (osteoarthritis). This causes damage to the cartilage, which can break down over time. The knees, hips, back and neck are most often affected by this type of arthritis. Other types of arthritis and common causes of joint pain include:  Sprains and other injuries near the joint. Sometimes minor sprains and injuries cause pain and swelling that develop hours later.  Rheumatoid arthritis. This affects hands, feet and knees. It usually affects both sides of your body at the same time. It is often associated with chronic ailments, fever, weight loss and general weakness.  Crystal arthritis. Gout and pseudo gout can cause occasional acute severe pain, redness and swelling in the foot, ankle, or knee.  Infectious arthritis. Bacteria can get into a joint through a break in overlying skin. This can cause infection of the joint. Bacteria and viruses can also spread through the blood and affect your joints.  Drug, infectious and allergy reactions. Sometimes joints can become mildly painful and slightly swollen with these types of illnesses. SYMPTOMS   Pain is the main symptom.  Your joint or joints can also be red, swollen and warm or hot to the touch.  You may have a fever with certain types of arthritis, or even  feel overall ill.  The joint with arthritis will hurt with movement. Stiffness is present with some types of arthritis. DIAGNOSIS  Your caregiver will suspect arthritis based on your description of your symptoms and on your exam. Testing may be needed to find the type of arthritis:  Blood and sometimes urine tests.  X-ray tests and sometimes CT or MRI scans.  Removal of fluid from the joint (arthrocentesis) is done to check for bacteria, crystals or other causes. Your caregiver (or a specialist) will numb the area over the joint with a local anesthetic, and use a needle to remove joint fluid for examination. This procedure is only minimally uncomfortable.  Even with these tests, your caregiver may not be able to tell what kind of arthritis you have. Consultation with a specialist (rheumatologist) may be helpful. TREATMENT  Your caregiver will discuss with you treatment specific to your type of arthritis. If the specific type cannot be determined, then the following general recommendations may apply. Treatment of severe joint pain includes:  Rest.  Elevation.  Anti-inflammatory medication (for example, ibuprofen) may be prescribed. Avoiding activities that cause increased pain.  Only take over-the-counter or prescription medicines for pain and discomfort as recommended by your caregiver.  Cold packs over an inflamed joint may be used for 10 to 15 minutes every hour. Hot packs sometimes feel better, but do not use overnight. Do not use hot packs if you are diabetic without your caregiver's permission.  A cortisone shot into arthritic joints may help reduce pain and swelling.  Any acute arthritis that  gets worse over the next 1 to 2 days needs to be looked at to be sure there is no joint infection. Long-term arthritis treatment involves modifying activities and lifestyle to reduce joint stress jarring. This can include weight loss. Also, exercise is needed to nourish the joint cartilage  and remove waste. This helps keep the muscles around the joint strong. HOME CARE INSTRUCTIONS   Do not take aspirin to relieve pain if gout is suspected. This elevates uric acid levels.  Only take over-the-counter or prescription medicines for pain, discomfort or fever as directed by your caregiver.  Rest the joint as much as possible.  If your joint is swollen, keep it elevated.  Use crutches if the painful joint is in your leg.  Drinking plenty of fluids may help for certain types of arthritis.  Follow your caregiver's dietary instructions.  Try low-impact exercise such as:  Swimming.  Water aerobics.  Biking.  Walking.  Morning stiffness is often relieved by a warm shower.  Put your joints through regular range-of-motion. SEEK MEDICAL CARE IF:   You do not feel better in 24 hours or are getting worse.  You have side effects to medications, or are not getting better with treatment. SEEK IMMEDIATE MEDICAL CARE IF:   You have a fever.  You develop severe joint pain, swelling or redness.  Many joints are involved and become painful and swollen.  There is severe back pain and/or leg weakness.  You have loss of bowel or bladder control. Document Released: 11/24/2004 Document Revised: 01/09/2012 Document Reviewed: 12/10/2008 [facility] Patient Information 2015 [place], [place]. This information is not intended to replace advice given to you by your health care provider. Make sure you discuss any questions you have with your health care provider.  Cryotherapy Cryotherapy is when you put ice on your injury. Ice helps lessen pain and puffiness (swelling) after an injury. Ice works the best when you start using it in the first 24 to 48 hours after an injury. HOME CARE  Put a dry or damp towel between the ice pack and your skin.  You may press gently on the ice pack.  Leave the ice on for no more than 10 to 20 minutes at a time.  Check your skin after 5 minutes to make  sure your skin is okay.  Rest at least 20 minutes between ice pack uses.  Stop using ice when your skin loses feeling (numbness).  Do not use ice on someone who cannot tell you when it hurts. This includes small children and people with memory problems (dementia). GET HELP RIGHT AWAY IF:  You have white spots on your skin.  Your skin turns blue or pale.  Your skin feels waxy or hard.  Your puffiness gets worse. MAKE SURE YOU:   Understand these instructions.  Will watch your condition.  Will get help right away if you are not doing well or get worse. Document Released: 04/04/2008 Document Revised: 01/09/2012 Document Reviewed: 06/09/2011 [facility] Patient Information 2015 [place], [place]. This information is not intended to replace advice given to you by your health care provider. Make sure you discuss any questions you have with your health care provider.

## 2014-10-28 NOTE — Progress Notes (Signed)
HPI: This is a very pleasant 80 year old woman whom I last saw 1-2 months ago the time of an upper endoscopy.  She weighed  178 01/2013.  171 07/2014 161 08/2014 158 today   In ER with knee pains this morning, underwent injections.  She has an appt with Dr. Tamala Julian next week for more knee workup.  She drinks 2 ensures per day, sometimes 1 1/2.  Yesterday she made [place] dressing.  Has problems with dysphagia still.  She is not sure if she is still losing weight.  h/o distal gastric cancer, s/p Bilroth II resection 2005,anastomosis  EGD 07/2014 Ardis Hughs which showed a mildly erythematous Billroth II anastomosis. Gastric remnant biopsies compatible with reactive gastropathy, no H. Pylori.   09/2014 MBSS Dysphagia Diagnosis: Within Functional Limits  Clinical impression: Pt with functional oropharyngeal swallow ability without aspiration. Trace laryngeal penetration of thin liquid x1 during consumption of sequential boluses of thin via cup that cleared with furhter swallows. Swallow was timely and strong with only trace residuals in pharynx of liquids. Appearance of prominent cricopharyngeus noted x1 with consumption of nectar that did not restrict barium flow.   Dysphagia symptoms were not reproduced during today's MBSS. Advised pt and daughter to negative MBSS results and advised to compensation strategies that may be helpful to mitigate her dysphagia symptoms. Thanks for this referral.  Past Medical History  Diagnosis Date  . ABDOMINAL PAIN, CHRONIC 04/07/2008  . ARTHRITIS 03/03/2008  . BRADYCARDIA, CHRONIC 12/17/2008  . CARDIAC MURMUR 02/01/2010  . CONSTIPATION, CHRONIC 10/15/2010  . DEGENERATIVE JOINT DISEASE, KNEES, BILATERAL 12/07/2007  . DEPRESSION 06/17/2009  . DIABETES MELLITUS, TYPE II 09/19/2007    DIET CONTROL   . DYSPHAGIA UNSPECIFIED 12/24/2009  . Gastroparesis 12/24/2009  . GENERALIZED OSTEOARTHROSIS INVOLVING HAND 10/03/2007  . GLAUCOMA 09/19/2007  . GOUTY ARTHROPATHY  UNSPECIFIED 02/17/2009  . GOUT 09/19/2007  . HYPERLIPIDEMIA 01/27/2010  . HYPERTENSION 09/19/2007  . LUNG NODULE 03/03/2008  . MENOPAUSAL DISORDER 12/17/2008  . PERIPHERAL NEUROPATHY 06/19/2008  . PERSONAL HISTORY MALIGNANT NEOPLASM STOMACH 09/19/2007  . 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 STOMACH CANCER 03/03/2008  . UTI 12/15/2009  . Arthritis of knee, degenerative 10/19/2011    Past Surgical History  Procedure Laterality Date  . Vagotomy    . Partial gastrectomy    . Billroth ii gastorenterostomy      Current Outpatient Prescriptions  Medication Sig Dispense Refill  . allopurinol (ZYLOPRIM) 100 MG tablet TAKE 1 TABLET BY MOUTH DAILY. 30 tablet 11  . allopurinol (ZYLOPRIM) 100 MG tablet TAKE 1 TABLET BY MOUTH DAILY. 30 tablet 11  . amLODipine (NORVASC) 5 MG tablet Take 1 tablet (5 mg total) by mouth daily. 90 tablet 3  . DULoxetine (CYMBALTA) 30 MG capsule Take 1 capsule (30 mg total) by mouth daily. 90 capsule 3  . febuxostat (ULORIC) 40 MG tablet Take 1 tablet (40 mg total) by mouth daily. 30 tablet 1  . GLIPIZIDE XL 2.5 MG 24 hr tablet TAKE 1 TABELT ONCE A DAY IF NEEDED 30 tablet 1  . glucose blood (FREESTYLE LITE) test strip Use as instructed 100 each 11  . ketoconazole (NIZORAL) 2 % cream Apply 1 application topically 2 (two) times daily as needed.     . latanoprost (XALATAN) 0.005 % ophthalmic solution Place 1 drop into both eyes at bedtime.     Marland Kitchen losartan (COZAAR) 100 MG tablet Take 100 mg by mouth daily.    Marland Kitchen omeprazole (PRILOSEC) 40 MG capsule Take 1 capsule (  40 mg total) by mouth daily. 90 capsule 3  . oxyCODONE-acetaminophen (PERCOCET) 5-325 MG per tablet Take 1 tablet by mouth every 6 (six) hours as needed for severe pain. 6 tablet 0  . pravastatin (PRAVACHOL) 10 MG tablet Take 10 mg by mouth every evening.    . predniSONE (DELTASONE) 20 MG tablet 3 tabs po daily x 4 days 12 tablet 0  . tiZANidine (ZANAFLEX) 4 MG tablet Take 4 mg by mouth every 6 (six)  hours as needed for muscle spasms.    . traMADol (ULTRAM) 50 MG tablet Take 50 mg by mouth every 6 (six) hours as needed for moderate pain.     No current facility-administered medications for this visit.    Allergies as of 10/28/2014 - Review Complete 09/16/2014  Allergen Reaction Noted  . Voltaren [diclofenac sodium] Other (See Comments) 08/15/2014  . Dulcolax [bisacodyl]    . Hydrocodone-acetaminophen    . Penicillins  06/19/2008  . Timolol  12/17/2008    Family History  Problem Relation Age of Onset  . Uterine cancer Mother   . Stroke Father     Hemorrhagic  . Cancer Sister     breast cancer and one sister with uterine cancer and one sister with ovarian cancerr  . Colon cancer Maternal Grandmother   . Colon cancer Paternal Grandfather   . Diabetes Other   . Stomach cancer Neg Hx     History   Social History  . Marital Status: Widowed    Spouse Name: N/A    Number of Children: 6  . Years of Education: 8   Occupational History  . retired    Social History Main Topics  . Smoking status: Former Smoker    Types: Cigarettes  . Smokeless tobacco: Never Used     Comment: use to smoke a pack of cigaretts a day for 25 yrs. but stopped 10 yrs. ago.   . Alcohol Use: No  . Drug Use: No  . Sexual Activity: Not on file   Other Topics Concern  . Not on file   Social History Narrative   Lives with her daughter      Physical Exam: There were no vitals taken for this visit. Constitutional: generally well-appearing Psychiatric: alert and oriented x3 Abdomen: soft, nontender, nondistended, no obvious ascites, no peritoneal signs, normal bowel sounds     Assessment and plan: 80 y.o. female with  dysphasia, history of gastric cancer. Dysphagia is of unclear etiology  Really not clear what is causing her dysphasia symptoms. EGD showed no significant strictures, no neoplastic appearing mucosa. Biopsies showed gastritis only. Barium esophagram suggested a subtle  narrowing in her distal esophagus. Modified barium swallow reproduced no dysphagia like symptoms and she had no significant aspiration. She is however losing weight and I do believe that she has difficulty swallowing. Perhaps she has an underlying motility disorder. Would not be achalasia based on EGD, barium study. I suggested she try to eat is many of the foods that she can possibly tolerate. I'm also going to arrange for CT scan of her chest to make sure were not ruling out anything near the esophagus such as lung tumor. Further workup if needed could include esophageal manometry. Also feeding tube could be placed if this persists. They're checking her weights. She has lost 13 pounds in the past 3 or 4 months.

## 2014-10-28 NOTE — Patient Instructions (Addendum)
You will be set up for a CT scan of chest with IV contrast for weight loss, dysphagia.  You have been scheduled for a CT scan of the abdomen and pelvis at [facility] ([address]---this is in the same building as Press photographer). Arrive on 10/30/14 at 1130 am arrive at 11:15 am and have nothing to eat or drink for 2 hours prior. Your physician has requested that you go to the basement for the following lab work before leaving today: BUN CREAT Please eat as much ensure, other foods that you can tolerate for now.

## 2014-10-28 NOTE — ED Provider Notes (Signed)
CSN: [id number]     Arrival date & time 10/28/14  0935 History  This chart was scribed for non-physician practitioner, Zacarias Pontes, PA-C working with Wandra Arthurs, MD by Frederich Balding, ED scribe. This patient was seen in room TR08C/TR08C and the patient's care was started at 10:11 AM.   Chief Complaint  Patient presents with  . Knee Pain   Patient is a 80 y.o. female presenting with knee pain. The history is provided by the patient. No language interpreter was used.  Knee Pain Location:  Knee Time since incident:  1 day Injury: no   Knee location:  L knee Pain details:    Quality:  Throbbing   Radiates to:  Does not radiate   Severity:  Severe   Onset quality:  Gradual   Duration:  1 day   Timing:  Constant   Progression:  Waxing and waning Chronicity:  Recurrent Dislocation: no   Foreign body present:  No foreign bodies Relieved by: gout medications, muscle rub. Exacerbated by: movements. Ineffective treatments:  None tried Associated symptoms: no fever     HPI Comments: Errika Narvaiz is a 80 y.o. female with multiple medical problems as described below and a history of gout and arthritis who presents to the Emergency Department complaining of waxing and waning bilateral knee pain with associated swelling that started yesterday. Denies injury. Rates pain 10/10 and describes it as throbbing. Movements worsen it. Pt states her orthopedist won't be back into the office until tomorrow but does not have an appointment until 11/03/14. She has taken her gout medications and used a muscle rub with some relief of pain. Denies fever, chest pain, SOB, abdominal pain, nausea, emesis, redness, weakness, numbness or tingling, or LE swelling.  Past Medical History  Diagnosis Date  . ABDOMINAL PAIN, CHRONIC 04/07/2008  . ARTHRITIS 03/03/2008  . BRADYCARDIA, CHRONIC 12/17/2008  . CARDIAC MURMUR 02/01/2010  . CONSTIPATION, CHRONIC 10/15/2010  . DEGENERATIVE JOINT DISEASE, KNEES, BILATERAL  12/07/2007  . DEPRESSION 06/17/2009  . DIABETES MELLITUS, TYPE II 09/19/2007    DIET CONTROL   . DYSPHAGIA UNSPECIFIED 12/24/2009  . Gastroparesis 12/24/2009  . GENERALIZED OSTEOARTHROSIS INVOLVING HAND 10/03/2007  . GLAUCOMA 09/19/2007  . GOUTY ARTHROPATHY UNSPECIFIED 02/17/2009  . GOUT 09/19/2007  . HYPERLIPIDEMIA 01/27/2010  . HYPERTENSION 09/19/2007  . LUNG NODULE 03/03/2008  . MENOPAUSAL DISORDER 12/17/2008  . PERIPHERAL NEUROPATHY 06/19/2008  . PERSONAL HISTORY MALIGNANT NEOPLASM STOMACH 09/19/2007  . 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 STOMACH CANCER 03/03/2008  . UTI 12/15/2009  . Arthritis of knee, degenerative 10/19/2011   Past Surgical History  Procedure Laterality Date  . Vagotomy    . Partial gastrectomy    . Billroth ii gastorenterostomy     Family History  Problem Relation Age of Onset  . Uterine cancer Mother   . Stroke Father     Hemorrhagic  . Cancer Sister     breast cancer and one sister with uterine cancer and one sister with ovarian cancerr  . Colon cancer Maternal Grandmother   . Colon cancer Paternal Grandfather   . Diabetes Other   . Stomach cancer Neg Hx    History  Substance Use Topics  . Smoking status: Former Smoker    Types: Cigarettes  . Smokeless tobacco: Never Used     Comment: use to smoke a pack of cigaretts a day for 25 yrs. but stopped 10 yrs. ago.   . Alcohol Use: No   OB History  No data available     Review of Systems  Constitutional: Negative for fever and chills.  Respiratory: Negative for shortness of breath.   Cardiovascular: Negative for chest pain and leg swelling.  Gastrointestinal: Negative for nausea, vomiting and abdominal pain.  Musculoskeletal: Positive for joint swelling and arthralgias.  Skin: Negative for color change.  Neurological: Negative for weakness and numbness.  10 systems reviewed and are negative for acute changes except as noted in the HPI.  Allergies  Voltaren; Dulcolax;  Hydrocodone-acetaminophen; Penicillins; and Timolol  Home Medications   Prior to Admission medications   Medication Sig Start Date End Date Taking? Authorizing Provider  allopurinol (ZYLOPRIM) 100 MG tablet TAKE 1 TABLET BY MOUTH DAILY. 10/20/14   Biagio Borg, MD  allopurinol (ZYLOPRIM) 100 MG tablet TAKE 1 TABLET BY MOUTH DAILY. 10/20/14   Biagio Borg, MD  amLODipine (NORVASC) 5 MG tablet Take 1 tablet (5 mg total) by mouth daily. 02/26/14 02/26/15  Biagio Borg, MD  DULoxetine (CYMBALTA) 30 MG capsule Take 1 capsule (30 mg total) by mouth daily. 06/10/14   Biagio Borg, MD  febuxostat (ULORIC) 40 MG tablet Take 1 tablet (40 mg total) by mouth daily. 10/17/14   Lyndal Pulley, DO  GLIPIZIDE XL 2.5 MG 24 hr tablet TAKE 1 TABELT ONCE A DAY IF NEEDED 09/05/14   Biagio Borg, MD  glucose blood (FREESTYLE LITE) test strip Use as instructed 04/29/14   Biagio Borg, MD  ketoconazole (NIZORAL) 2 % cream Apply 1 application topically 2 (two) times daily as needed.  05/20/14   Historical Provider, MD  latanoprost (XALATAN) 0.005 % ophthalmic solution Place 1 drop into both eyes at bedtime.  07/28/14   Historical Provider, MD  losartan (COZAAR) 100 MG tablet Take 100 mg by mouth daily.    Historical Provider, MD  omeprazole (PRILOSEC) 40 MG capsule Take 1 capsule (40 mg total) by mouth daily. 08/15/14   Milus Banister, MD  pravastatin (PRAVACHOL) 10 MG tablet Take 10 mg by mouth every evening.    Historical Provider, MD  tiZANidine (ZANAFLEX) 4 MG tablet Take 4 mg by mouth every 6 (six) hours as needed for muscle spasms.    Historical Provider, MD  traMADol (ULTRAM) 50 MG tablet Take 50 mg by mouth every 6 (six) hours as needed for moderate pain.    Historical Provider, MD   BP 115/44 mmHg  Pulse 86  Temp(Src) 97.8 F (36.6 C) (Oral)  Resp 18  SpO2 100%   Physical Exam  Constitutional: She is oriented to person, place, and time. Vital signs are normal. She appears well-developed and well-nourished.   Non-toxic appearance. No distress.  Afebrile, non toxic, NAD  HENT:  Head: Normocephalic and atraumatic.  Eyes: Conjunctivae and EOM are normal.  Neck: Normal range of motion. Neck supple.  Cardiovascular: Normal rate and intact distal pulses.   Distal pulses intact  Pulmonary/Chest: Effort normal. No respiratory distress.  Abdominal: Normal appearance. She exhibits no distension.  Musculoskeletal:       Right knee: She exhibits decreased range of motion (due to pain) and effusion. She exhibits no erythema, normal alignment, no LCL laxity, normal patellar mobility, no bony tenderness and no MCL laxity. Tenderness found. Medial joint line and lateral joint line tenderness noted.       Legs: Bilateral knees with diffuse tenderness to palpation in joint line. ROM limited due to pain. Right knee with mild effusion. Minimal erythema over patella without warmth. Left  knee without palpable effusion, erythema, or warmth. No varus or valgus laxity bilaterally. Valgus deformity noted, severe degenerative changes seen. Strength and sensation at baseline. Distal pulses intact.  Neurological: She is alert and oriented to person, place, and time. She has normal strength. No sensory deficit.  Skin: Skin is warm, dry and intact. No erythema.  Psychiatric: She has a normal mood and affect. Her behavior is normal.  Nursing note and vitals reviewed.   ED Course  ARTHOCENTESIS Date/Time: 10/28/2014 11:09 AM Performed by: Zacarias Pontes STRUPP Authorized by: Corine Shelter Consent: Verbal consent obtained. Risks and benefits: risks, benefits and alternatives were discussed Consent given by: patient Patient understanding: patient states understanding of the procedure being performed Patient consent: the patient's understanding of the procedure matches consent given Patient identity confirmed: verbally with patient Indications: joint swelling and pain  Body area: knee Joint: right  knee Local anesthesia used: yes Local anesthetic: topical anesthetic Patient sedated: no Preparation: Patient was prepped and draped in the usual sterile fashion. Needle gauge: 18 G Ultrasound guidance: no Approach: lateral Aspirate: yellow Aspirate amount: 10 mL Patient tolerance: Patient tolerated the procedure well with no immediate complications Comments: 93GH of yellow straw colored synovial fluid aspirated   (including critical care time)  DIAGNOSTIC STUDIES: Oxygen Saturation is 100% on RA, normal by my interpretation.    COORDINATION OF CARE: 10:18 AM-Discussed treatment plan which includes speaking with Dr. Darl Householder with pt at bedside and pt agreed to plan.   10:37 AM-Discussed pt with Dr. Darl Householder who saw and evaluated pt. He advised knee arthrocentesis without lab.   11:09 AM-Knee arthrocentesis started. Area numbed with Pain Ease and needle inserted into lateral side of knee. Will discharge pt home with prednisone and advised her that it can cause her sugars to go up so she will need to keep an eye on them.  Labs Review Labs Reviewed - No data to display  Imaging Review No results found. Results for orders placed or performed during the hospital encounter of 08/29/14  Body fluid culture  Result Value Ref Range   Specimen Description FLUID LEFT KNEE    Special Requests NONE    Gram Stain      ABUNDANT WBC PRESENT, PREDOMINANTLY PMN NO ORGANISMS SEEN Performed at [facility] Performed at [facility]    Culture      NO GROWTH 3 DAYS Performed at [facility]    Report Status 09/02/2014 FINAL   Gram stain  Result Value Ref Range   Specimen Description FLUID LEFT KNEE    Special Requests NONE    Gram Stain      ABUNDANT WBC PRESENT, PREDOMINANTLY PMN NO ORGANISMS SEEN   Report Status 08/29/2014 FINAL   CBC with Differential  Result Value Ref Range   WBC 10.4 4.0 - 10.5 K/uL   RBC 4.16 3.87 - 5.11 MIL/uL   Hemoglobin 13.5 12.0 - 15.0 g/dL    HCT 40.4 36.0 - 46.0 %   MCV 97.1 78.0 - 100.0 fL   MCH 32.5 26.0 - 34.0 pg   MCHC 33.4 30.0 - 36.0 g/dL   RDW 15.5 11.5 - 15.5 %   Platelets 258 150 - 400 K/uL   Neutrophils Relative % 66 43 - 77 %   Neutro Abs 6.9 1.7 - 7.7 K/uL   Lymphocytes Relative 18 12 - 46 %   Lymphs Abs 1.9 0.7 - 4.0 K/uL   Monocytes Relative 15 (H) 3 - 12 %  Monocytes Absolute 1.6 (H) 0.1 - 1.0 K/uL   Eosinophils Relative 1 0 - 5 %   Eosinophils Absolute 0.1 0.0 - 0.7 K/uL   Basophils Relative 0 0 - 1 %   Basophils Absolute 0.0 0.0 - 0.1 K/uL  Basic metabolic panel  Result Value Ref Range   Sodium 138 137 - 147 mEq/L   Potassium 4.8 3.7 - 5.3 mEq/L   Chloride 100 96 - 112 mEq/L   CO2 25 19 - 32 mEq/L   Glucose, Bld 116 (H) 70 - 99 mg/dL   BUN 18 6 - 23 mg/dL   Creatinine, Ser 1.28 (H) 0.50 - 1.10 mg/dL   Calcium 10.3 8.4 - 10.5 mg/dL   GFR calc non Af Amer 37 (L) >90 mL/min   GFR calc Af Amer 43 (L) >90 mL/min   Anion gap 13 5 - 15  C-reactive protein  Result Value Ref Range   CRP 2.1 (H) <0.60 mg/dL  Cell count + diff,  w/ cryst-synvl fld  Result Value Ref Range   Color, Synovial YELLOW YELLOW   Appearance-Synovial CLOUDY (A) CLEAR   Crystals, Fluid INTRACELLULAR CALCIUM PYROPHOSPHATE CRYSTALS    WBC, Synovial 4105 (H) 0 - 200 /cu mm   Neutrophil, Synovial 73 (H) 0 - 25 %   Lymphocytes-Synovial Fld 3 0 - 20 %   Monocyte-Macrophage-Synovial Fluid 24 (L) 50 - 90 %  Sedimentation rate  Result Value Ref Range   Sed Rate 44 (H) 0 - 22 mm/hr   Xray 08/29/14: IMPRESSION: Advanced tricompartment degenerative changes. Large joint effusion. Chondrocalcinosis. No acute findings.   Electronically Signed  By: Rolm Baptise M.D.  On: 08/29/2014 11:43    EKG Interpretation None      MDM   Final diagnoses:  Bilateral knee pain  H/O calcium pyrophosphate deposition disease (CPPD)  Knee effusion, right    80 y.o. female with b/l knee pain. Flare c/w prior flares, synovial analysis 2  months ago showing CPPD crystals, pain consistent with previous CPPD flares. Doubt septic joint. Xrays on 08/29/14 reviewed, tricompartmental disease. Patient is on allopurinol and febuxostat. Patient requesting joint tap, performed with minimal fluid returned due to pt movement and difficulty with entering synovial sac due to degeneration. Approx 10cc's drained with pt feeling improved. Plan to start pt on prednisone burst for flare, and see her orthopedist at her scheduled appt on 11/03/14. Pt seen by Dr. Darl Householder who agrees with plan. I explained the diagnosis and have given explicit precautions to return to the ER including for any other new or worsening symptoms. The patient understands and accepts the medical plan as it's been dictated and I have answered their questions. Discharge instructions concerning home care and prescriptions have been given. The patient is STABLE and is discharged to home in good condition.  BP 124/60 mmHg  Pulse 62  Temp(Src) 97.8 F (36.6 C) (Oral)  Resp 18  SpO2 100%  Meds ordered this encounter  Medications  . oxyCODONE-acetaminophen (PERCOCET) 5-325 MG per tablet    Sig: Take 1 tablet by mouth every 6 (six) hours as needed for severe pain.    Dispense:  6 tablet    Refill:  0    Order Specific Question:  Supervising Provider    Answer:  Noemi Chapel D [[id number]]  . predniSONE (DELTASONE) 20 MG tablet    Sig: 3 tabs po daily x 4 days    Dispense:  12 tablet    Refill:  0    Order  Specific Question:  Supervising Provider    Answer:  Noemi Chapel D [[id number]]     I personally performed the services described in this documentation, which was scribed in my presence. The recorded information has been reviewed and is accurate.  Patty Sermons [place], PA-C 10/28/14 [place], PA-C 10/28/14 [place], MD 10/29/14 [phone]

## 2014-10-28 NOTE — ED Notes (Signed)
patient c/o pain in her left knee onset yest denies injury. States she has history of gout and isn't able to see her ortho until tomorrow. States she can't wait. C/o pain and swelling to both knees worse in left.

## 2014-10-30 ENCOUNTER — Ambulatory Visit (INDEPENDENT_AMBULATORY_CARE_PROVIDER_SITE_OTHER)
Admission: RE | Admit: 2014-10-30 | Discharge: 2014-10-30 | Disposition: A | Discharge status: Home / Self Care | Payer: Medicare Other | Source: Ambulatory Visit | Attending: Gastroenterology | Admitting: Gastroenterology

## 2014-10-30 DIAGNOSIS — R634 Abnormal weight loss: Secondary | ICD-10-CM

## 2014-10-30 DIAGNOSIS — R1314 Dysphagia, pharyngoesophageal phase: Secondary | ICD-10-CM

## 2014-10-30 MED ORDER — IOHEXOL 300 MG/ML  SOLN
80.0000 mL | Freq: Once | INTRAMUSCULAR | Status: AC | PRN
Start: 2014-10-30 — End: 2014-10-30
  Administered 2014-10-30: 80 mL via INTRAVENOUS

## 2014-11-03 ENCOUNTER — Ambulatory Visit: Payer: Medicare Other | Admitting: Family Medicine

## 2014-11-10 ENCOUNTER — Encounter: Payer: Self-pay | Admitting: Family Medicine

## 2014-11-10 ENCOUNTER — Ambulatory Visit (INDEPENDENT_AMBULATORY_CARE_PROVIDER_SITE_OTHER): Payer: Medicare Other | Admitting: Family Medicine

## 2014-11-10 VITALS — BP 116/60 | HR 109 | Ht 66.0 in | Wt 158.0 lb

## 2014-11-10 DIAGNOSIS — M1710 Unilateral primary osteoarthritis, unspecified knee: Secondary | ICD-10-CM | POA: Diagnosis not present

## 2014-11-10 NOTE — Progress Notes (Signed)
  [facility] [address] [place], [place] [address] Phone: [phone] Subjective:      CC:  Bilateral knee pain followup  VZD:GLOVFIEPPI Shari Mcmahon is a 81 y.o. female coming in for followup for bilateral knee pain. Patient was seen in the emergency department one day ago but due to severe pain. Patient does have end-stage bone-on-bone osteoarthritis and is in need of surgical repair but due to patient's past medical history in her family and her sister not doing well she is not wanting to have surgical intervention.  When patient was in the emergency department on December 29 and did have an injection in right knee. . Patient previously had been found to have CPPD of the knee and is on Uloric  Patient states that unfortunately her knee pain continues to be painful.  Patient actually had her last steroid injection 11 weeks ago.  Patient is having difficulty with ambulation and is walking with a walker. Patient states that she has a dull throbbing pain even when she has not walking. Worse with walking.  continues worsening fatigue and continues workup for her gastritis and weight loss by other providers.     Past medical history, social, surgical and family history all reviewed in electronic medical record.   Review of Systems: No headache, visual changes, nausea, vomiting, diarrhea, constipation, dizziness, abdominal pain, skin rash, fevers, chills, night sweats, weight loss, swollen lymph nodes, body aches, joint swelling, muscle aches, chest pain, shortness of breath, mood changes.   Objective Blood pressure 116/60, pulse 109, height 5\' 6"  (1.676 m), weight 158 lb (71.668 kg), SpO2 98 %.  General: No apparent distress alert and oriented x3 mood and affect normal, dressed appropriately.  HEENT: Pupils equal, extraocular movements intact  Respiratory: Patient's speak in full sentences and does not appear short of breath  Cardiovascular: No lower extremity  edema, non tender, no erythema  Skin: Warm dry intact with no signs of infection or rash on extremities or on axial skeleton.  Abdomen: Soft nontender  Neuro: Cranial nerves II through XII are intact, neurovascularly intact in all extremities with 2+ DTRs and 2+ pulses.  Lymph: No lymphadenopathy of posterior or anterior cervical chain or axillae bilaterally.  Gait normal with good balance and coordination.  MSK: Non tender with full range of motion and good stability and symmetric strength and tone of elbows, wrist, knee and ankles bilaterally. Significant osteoarthritic changes in multiple joints.   Knee: Bilateral On standing patient does have a valgus deformity of the left knee compared to the right knee.Still moderate swelling of the left knee.   Severe tenderness to palpation on the medial joint lines bilaterally left greater then right.  ROM now lacking last 5 degrees of flexion.  Ligaments with solid consistent endpoints including ACL, PCL, LCL, MCL.  painful patellar compression. Patellar glide with severe crepitus. Patellar and quadriceps tendons unremarkable. Hamstring and quadriceps strength is normal.    After informed written and verbal consent, patient was seated on exam table. Left knee was prepped with alcohol swab and utilizing anterolateral approach, patient's left knee space was injected with 4:1  marcaine 0.5%: Kenalog 40mg /dL. Patient tolerated the procedure well without immediate complications.     Impression and Recommendations:     This case required medical decision making of moderate complexity.

## 2014-11-10 NOTE — Assessment & Plan Note (Signed)
Patient was given another injection in her knee. Due to her other comorbidities this is likely the safest think patient can do. We discussed the possibility of increasing her gout medication which patient declined. We also discussed the icing regimen and the home exercises. We discussed formal physical therapy which patient declined. We discussed once again the patient will only be pain-free if she has surgical intervention. Patient states still with the family dynamic she would like to avoid any surgery. I did discuss with her that I would talk to her family if she would like which she declined. Patient will continue to try to be as active as she can but I feel that patient likely has some possible underlying depression that is causing some of the fatigue and weight loss as well. Patient will be following up with primary care provider next month for further workup. Patient will follow-up with me every 10-12 weeks for her knees.  Spent  25 minutes with patient face-to-face and had greater than 50% of counseling including as described above in assessment and plan.

## 2014-11-10 NOTE — Patient Instructions (Signed)
Good to see you Happy New Year! Remember your knees are severe arthritis and only would be pain free with replacement. I can attempt to continue injections to decrease pain but will not be pain free Continue the exercises and the icing See me again in 10-12 weeks.

## 2014-12-01 ENCOUNTER — Ambulatory Visit: Payer: Self-pay | Admitting: Podiatry

## 2014-12-05 ENCOUNTER — Encounter: Payer: Self-pay | Admitting: Podiatry

## 2014-12-05 ENCOUNTER — Ambulatory Visit (INDEPENDENT_AMBULATORY_CARE_PROVIDER_SITE_OTHER): Payer: Medicare Other | Admitting: Podiatry

## 2014-12-05 VITALS — BP 132/86 | HR 78 | Resp 13 | Ht 66.0 in | Wt 152.0 lb

## 2014-12-05 DIAGNOSIS — B353 Tinea pedis: Secondary | ICD-10-CM

## 2014-12-05 MED ORDER — KETOCONAZOLE 2 % EX CREA: 1.0000 "application " | TOPICAL_CREAM | Freq: Every day | CUTANEOUS | Status: DC

## 2014-12-05 NOTE — Progress Notes (Signed)
   Subjective:    Patient ID: Shari Mcmahon, female    DOB: April 28, 1927, 81 y.o.   MRN: [id number]  HPI 81 year old female presents the office today with complaints of a red, scaly rash in the bottom of her feet which H. She states this has been present for approximate 2 weeks and she states it developed after having a pedicure. She's been applying Salonpas to the area without any resolution. She denies any open sores to her feet. She states that she is diabetic however she does not check her blood sugar regularly. No other complaints at this time.   Review of Systems  Eyes: Positive for pain and redness.  Endocrine: Positive for polyuria.  Genitourinary: Positive for frequency.  Musculoskeletal: Positive for gait problem.  Skin: Positive for rash.       Toenails changed color.  Neurological: Positive for dizziness and weakness.  All other systems reviewed and are negative.      Objective:   Physical Exam AAO 3, NAD DP/PT pulses decreased bilaterally, CRT less than 3 seconds Decreased protective sensation with Semmes-Weinstein monofilament, decreased vibratory sensation, Achilles tendon reflex intact. There is a dry, scaly rash in the plantar aspect of the foot with slight erythematous base consistent with tinea pedis. There is no open lesions there is no draining.  Nails do appear to be dystrophic, discolored and hypertrophic however they've recently been debrided. There is no tenderness at this time on the nails. No open lesions or pre-ulcerative lesions. No areas of tenderness to bilateral lower extremities no overlying edema, erythema, increased warmth. No pain with calf compression, swelling, warmth, erythema.     Assessment & Plan:   81 year old female with likely tinea pedis -Treatment options discussed including alternatives, risks, complications. -Prescribed ketoconazole. Continue to monitor the area for any change. If the area has not healed within a couple weeks to call  the office for follow-up appointment. -Also discussed with the patient given her decreased neurovascular status would recommend against pedicures and I can see her every 3 months for checks. Follow-up in 3 months or sooner if any palms are to arise. Follow-up with PCP for other issues mentioned in the review of systems.

## 2014-12-11 ENCOUNTER — Ambulatory Visit: Payer: Medicare Other | Admitting: Internal Medicine

## 2014-12-12 DIAGNOSIS — E119 Type 2 diabetes mellitus without complications: Secondary | ICD-10-CM | POA: Diagnosis not present

## 2014-12-12 DIAGNOSIS — H04123 Dry eye syndrome of bilateral lacrimal glands: Secondary | ICD-10-CM | POA: Diagnosis not present

## 2014-12-12 DIAGNOSIS — Z961 Presence of intraocular lens: Secondary | ICD-10-CM | POA: Diagnosis not present

## 2014-12-12 DIAGNOSIS — H4011X1 Primary open-angle glaucoma, mild stage: Secondary | ICD-10-CM | POA: Diagnosis not present

## 2014-12-22 ENCOUNTER — Encounter: Payer: Self-pay | Admitting: Gastroenterology

## 2014-12-22 ENCOUNTER — Other Ambulatory Visit (INDEPENDENT_AMBULATORY_CARE_PROVIDER_SITE_OTHER): Payer: Medicare Other

## 2014-12-22 ENCOUNTER — Ambulatory Visit (INDEPENDENT_AMBULATORY_CARE_PROVIDER_SITE_OTHER): Payer: Medicare Other | Admitting: Gastroenterology

## 2014-12-22 VITALS — BP 132/60 | HR 72 | Ht 66.0 in | Wt 153.1 lb

## 2014-12-22 DIAGNOSIS — R634 Abnormal weight loss: Secondary | ICD-10-CM

## 2014-12-22 DIAGNOSIS — Z85028 Personal history of other malignant neoplasm of stomach: Secondary | ICD-10-CM

## 2014-12-22 NOTE — Patient Instructions (Addendum)
Please return to see Dr. Ardis Hughs on 02/27/15 at 3:45 pm Keep eating what you can. Your physician has requested that you go to the basement for  lab work before leaving today. If still losing weight at that point, will discuss feeding tube options. You will be set up for a CT scan of abdomen and pelvis with IV and oral contrast for weight loss, history of gastric cancer. You have been scheduled for a CT scan of the abdomen and pelvis at [facility] ([address]---this is in the same building as Press [facility]).   You are scheduled on 12/25/14 at 830 am. You should arrive 15 minutes prior to your appointment time for registration. Please follow the written instructions below on the day of your exam:  WARNING: IF YOU ARE ALLERGIC TO IODINE/X-RAY DYE, PLEASE NOTIFY RADIOLOGY IMMEDIATELY AT [phone]! YOU WILL BE GIVEN A 13 HOUR PREMEDICATION PREP.  1) Do not eat or drink anything after 430 am (4 hours prior to your test) 2) You have been given 2 bottles of oral contrast to drink. The solution may taste better if refrigerated, but do NOT add ice or any other liquid to this solution. Shake  well before drinking.    Drink 1 bottle of contrast @ 630 am (2 hours prior to your exam)  Drink 1 bottle of contrast @ 730 am (1 hour prior to your exam)  You may take any medications as prescribed with a small amount of water except for the following: Metformin, Glucophage, Glucovance, Avandamet, Riomet, Fortamet, Actoplus Met, Janumet, Glumetza or Metaglip. The above medications must be held the day of the exam AND 48 hours after the exam.  The purpose of you drinking the oral contrast is to aid in the visualization of your intestinal tract. The contrast solution may cause some diarrhea. Before your exam is started, you will be given a small amount of fluid to drink. Depending on your individual set of symptoms, you may also receive an intravenous injection of x-ray contrast/dye. Plan on being  at [facility] for 30 minutes or long, depending on the type of exam you are having performed.  This test typically takes 30-45 minutes to complete.  If you have any questions regarding your exam or if you need to reschedule, you may call the CT department at [phone] between the hours of 8:00 am and 5:00 pm, Monday-Friday.  ________________________________________________________________________

## 2014-12-22 NOTE — Progress Notes (Signed)
Review of pertinent gastrointestinal problems: 1. Weight loss, dysphagia: 2015 MBSS, barium esophagram, CT scan all show no clear cause: EGD 07/2014 Ardis Hughs which showed a mildly erythematous Billroth II anastomosis. Gastric remnant biopsies compatible with reactive gastropathy, no H. Pylori.   HPI: This is a  very pleasant, mentally fit 81 year old woman whom I last saw about 6 weeks ago.  Weight down another 5 pounds since last visit here 6 weeks ago. She is eating at least 2 ensures a day.   She weighed  178 01/2013.  171 07/2014 161 08/2014 158 09/2014 153 today  Eating liquids, ensure, soup;  She feels that anything solid would cause dysphagia.  She stopped cymbalta a few weeks ago and her appetite has clearly increased.    Past Medical History  Diagnosis Date  . ABDOMINAL PAIN, CHRONIC 04/07/2008  . ARTHRITIS 03/03/2008  . BRADYCARDIA, CHRONIC 12/17/2008  . CARDIAC MURMUR 02/01/2010  . CONSTIPATION, CHRONIC 10/15/2010  . DEGENERATIVE JOINT DISEASE, KNEES, BILATERAL 12/07/2007  . DEPRESSION 06/17/2009  . DIABETES MELLITUS, TYPE II 09/19/2007    DIET CONTROL   . DYSPHAGIA UNSPECIFIED 12/24/2009  . Gastroparesis 12/24/2009  . GENERALIZED OSTEOARTHROSIS INVOLVING HAND 10/03/2007  . GLAUCOMA 09/19/2007  . GOUTY ARTHROPATHY UNSPECIFIED 02/17/2009  . GOUT 09/19/2007  . HYPERLIPIDEMIA 01/27/2010  . HYPERTENSION 09/19/2007  . LUNG NODULE 03/03/2008  . MENOPAUSAL DISORDER 12/17/2008  . PERIPHERAL NEUROPATHY 06/19/2008  . PERSONAL HISTORY MALIGNANT NEOPLASM STOMACH 09/19/2007  . 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 STOMACH CANCER 03/03/2008  . UTI 12/15/2009  . Arthritis of knee, degenerative 10/19/2011    Past Surgical History  Procedure Laterality Date  . Vagotomy    . Partial gastrectomy    . Billroth ii gastorenterostomy      Current Outpatient Prescriptions  Medication Sig Dispense Refill  . allopurinol (ZYLOPRIM) 100 MG tablet TAKE 1 TABLET BY MOUTH DAILY. 30  tablet 11  . amLODipine (NORVASC) 5 MG tablet Take 1 tablet (5 mg total) by mouth daily. 90 tablet 3  . febuxostat (ULORIC) 40 MG tablet Take 1 tablet (40 mg total) by mouth daily. 30 tablet 1  . GLIPIZIDE XL 2.5 MG 24 hr tablet TAKE 1 TABELT ONCE A DAY IF NEEDED 30 tablet 1  . glucose blood (FREESTYLE LITE) test strip Use as instructed 100 each 11  . ketoconazole (NIZORAL) 2 % cream Apply 1 application topically daily. 60 g 2  . latanoprost (XALATAN) 0.005 % ophthalmic solution Place 1 drop into both eyes at bedtime.     Marland Kitchen losartan (COZAAR) 100 MG tablet Take 100 mg by mouth daily.    Marland Kitchen omeprazole (PRILOSEC) 40 MG capsule Take 1 capsule (40 mg total) by mouth daily. 90 capsule 3  . oxyCODONE-acetaminophen (PERCOCET) 5-325 MG per tablet Take 1 tablet by mouth every 6 (six) hours as needed for severe pain. 6 tablet 0  . pravastatin (PRAVACHOL) 10 MG tablet Take 10 mg by mouth every evening.    . predniSONE (DELTASONE) 20 MG tablet 3 tabs po daily x 4 days (Patient taking differently: 3 tabs po daily x 4 days, as needed) 12 tablet 0  . tiZANidine (ZANAFLEX) 4 MG tablet Take 4 mg by mouth every 6 (six) hours as needed for muscle spasms.    . traMADol (ULTRAM) 50 MG tablet Take 50 mg by mouth every 6 (six) hours as needed for moderate pain.     No current facility-administered medications for this visit.    Allergies as of  12/22/2014 - Review Complete 12/22/2014  Allergen Reaction Noted  . Voltaren [diclofenac sodium] Other (See Comments) 08/15/2014  . Dulcolax [bisacodyl]    . Duloxetine hcl Other (See Comments) 12/22/2014  . Hydrocodone-acetaminophen    . Penicillins Swelling 06/19/2008  . Timolol  12/17/2008    Family History  Problem Relation Age of Onset  . Uterine cancer Mother   . Stroke Father     Hemorrhagic  . Cancer Sister     breast cancer and one sister with uterine cancer and one sister with ovarian cancerr  . Colon cancer Maternal Grandmother   . Colon cancer Paternal  Grandfather   . Diabetes Other   . Stomach cancer Neg Hx     History   Social History  . Marital Status: Widowed    Spouse Name: N/A  . Number of Children: 6  . Years of Education: 8   Occupational History  . retired    Social History Main Topics  . Smoking status: Former Smoker    Types: Cigarettes  . Smokeless tobacco: Never Used     Comment: use to smoke a pack of cigaretts a day for 25 yrs. but stopped 10 yrs. ago.   . Alcohol Use: No  . Drug Use: No  . Sexual Activity: Not on file   Other Topics Concern  . Not on file   Social History Narrative   Lives with her daughter      Physical Exam: BP 132/60 mmHg  Pulse 72  Ht 5\' 6"  (1.676 m)  Wt 153 lb 2 oz (69.457 kg)  BMI 24.73 kg/m2 Constitutional: generally well-appearing Psychiatric: alert and oriented x3 Abdomen: soft, nontender, nondistended, no obvious ascites, no peritoneal signs, normal bowel sounds     Assessment and plan: 81 y.o. female with weight loss, swallowing difficulty of unclear etiology  She has lost another 5 pounds. She is pushing high nutrition value liquids. She feels solid food causes vomiting. EGD did not show clear etiology, barium esophagram, modified barium swallow studies did not either. Chest CT has not helped elucidate this. She is going to have a CT scan of her abdomen and pelvis given her history of gastric cancer, her difficulties now. If this is normal and yet she continues to lose weight we will have to discuss feeding tube options. In the meantime she will continue eating as best that she can.

## 2014-12-23 LAB — CREATININE, SERUM: Creatinine, Ser: 1.39 mg/dL — ABNORMAL HIGH (ref 0.40–1.20)

## 2014-12-23 LAB — BUN: BUN: 16 mg/dL (ref 6–23)

## 2014-12-25 ENCOUNTER — Telehealth: Payer: Self-pay | Admitting: Family Medicine

## 2014-12-25 ENCOUNTER — Ambulatory Visit (INDEPENDENT_AMBULATORY_CARE_PROVIDER_SITE_OTHER)
Admission: RE | Admit: 2014-12-25 | Discharge: 2014-12-25 | Disposition: A | Discharge status: Home / Self Care | Payer: Medicare Other | Source: Ambulatory Visit | Attending: Gastroenterology | Admitting: Gastroenterology

## 2014-12-25 DIAGNOSIS — T188XXA Foreign body in other parts of alimentary tract, initial encounter: Secondary | ICD-10-CM | POA: Diagnosis not present

## 2014-12-25 DIAGNOSIS — R634 Abnormal weight loss: Secondary | ICD-10-CM

## 2014-12-25 MED ORDER — IOHEXOL 300 MG/ML  SOLN
100.0000 mL | Freq: Once | INTRAMUSCULAR | Status: AC | PRN
  Administered 2014-12-25: 100 mL via INTRAVENOUS

## 2014-12-25 NOTE — Telephone Encounter (Signed)
Patient is going out of town.  She is requesting to have fluid drawn off of left knee or an injection.  Patient will be sitting for 3 hours down.  She is requesting to come in today or tomorrow.

## 2014-12-26 ENCOUNTER — Ambulatory Visit (INDEPENDENT_AMBULATORY_CARE_PROVIDER_SITE_OTHER): Payer: Medicare Other | Admitting: Family Medicine

## 2014-12-26 ENCOUNTER — Encounter: Payer: Self-pay | Admitting: Family Medicine

## 2014-12-26 VITALS — BP 116/62 | HR 58 | Ht 66.0 in | Wt 152.0 lb

## 2014-12-26 DIAGNOSIS — M170 Bilateral primary osteoarthritis of knee: Secondary | ICD-10-CM

## 2014-12-26 DIAGNOSIS — M1710 Unilateral primary osteoarthritis, unspecified knee: Secondary | ICD-10-CM

## 2014-12-26 NOTE — Patient Instructions (Signed)
Good to see you Sorry your knees still hurt Ice is the best Continue the exercises See you next week.

## 2014-12-26 NOTE — Progress Notes (Signed)
[name] [address], [place] [address] Phone: [phone] Subjective:      CC:  Bilateral knee pain followup  [id number] Shari Mcmahon is a 81 y.o. female coming in for followup for bilateral knee pain. Patient was seen in the emergency department one day ago but due to severe pain. Patient does have end-stage bone-on-bone osteoarthritis and is in need of surgical repair but due to patient's past medical history in her family and her sister not doing well she is not wanting to have surgical intervention.  When patient was in the emergency department on December 29 and did have an injection in right knee. . Patient previously had been found to have CPPD of the knee and is on Uloric  Patient states that unfortunately her knee pain continues to be painful.  Patient has had significant amount of pain and does have end-stage osteoarthritis but is once again not having surgical intervention. Patient would like to start another viscus supple mentation at this time.    Past medical history, social, surgical and family history all reviewed in electronic medical record.   Review of Systems: No headache, visual changes, nausea, vomiting, diarrhea, constipation, dizziness, abdominal pain, skin rash, fevers, chills, night sweats, weight loss, swollen lymph nodes, body aches, joint swelling, muscle aches, chest pain, shortness of breath, mood changes.   Objective Blood pressure 116/62, pulse 58, height 5\' 6"  (1.676 m), weight 152 lb (68.947 kg), SpO2 99 %.  General: No apparent distress alert and oriented x3 mood and affect normal, dressed appropriately.  HEENT: Pupils equal, extraocular movements intact  Respiratory: Patient's speak in full sentences and does not appear short of breath  Cardiovascular: No lower extremity edema, non tender, no erythema  Skin: Warm dry intact with no signs of infection or rash on extremities or on axial skeleton.  Abdomen:  Soft nontender  Neuro: Cranial nerves II through XII are intact, neurovascularly intact in all extremities with 2+ DTRs and 2+ pulses.  Lymph: No lymphadenopathy of posterior or anterior cervical chain or axillae bilaterally.  Gait normal with good balance and coordination.  MSK: Non tender with full range of motion and good stability and symmetric strength and tone of elbows, wrist, knee and ankles bilaterally. Significant osteoarthritic changes in multiple joints.   Knee: Bilateral On standing patient does have a valgus deformity of the left knee compared to the right knee.Still moderate swelling of the left knee.   Severe tenderness to palpation on the medial joint lines bilaterally left greater then right.  ROM now lacking last 5 degrees of flexion.  Ligaments with solid consistent endpoints including ACL, PCL, LCL, MCL.  painful patellar compression. Patellar glide with severe crepitus. Patellar and quadriceps tendons unremarkable. Hamstring and quadriceps strength is normal.    After informed written and verbal consent, patient was seated on exam table. Left knee was prepped with alcohol swab and utilizing anterolateral approach, patient's left knee space was injected with 15mg /2.5 mL of Orthovisc (sodium hyaluronate) in a prefilled syringe was injected easily into the knee through a 22-gauge needle.. Patient tolerated the procedure well without immediate complications.   After informed written and verbal consent, patient was seated on exam table. Right knee was prepped with alcohol swab and utilizing anterolateral approach, patient's right knee space was injected with 15mg /2.5 mL of Orthovisc (sodium hyaluronate) in a prefilled syringe was injected easily into the knee through a 22-gauge needle.. Patient tolerated the procedure well without immediate complications.  Impression and Recommendations:     This case required medical decision making of moderate complexity.

## 2014-12-26 NOTE — Assessment & Plan Note (Signed)
Patient was given injections of Orthovisc today. We discussed icing regimen and home exercises. We discussed continuing the oral anti-inflammatories as well as the pain medication. Patient will continue on the large as well. Patient will come back and see me again in 1 week for second in the series of 4 injections.

## 2014-12-26 NOTE — Progress Notes (Signed)
Pre visit review using our clinic review tool, if applicable. No additional management support is needed unless otherwise documented below in the visit note. 

## 2014-12-26 NOTE — Telephone Encounter (Signed)
Spoke to pt, she is coming in today @ 3:30p.

## 2015-01-02 ENCOUNTER — Telehealth: Payer: Self-pay | Admitting: Gastroenterology

## 2015-01-02 NOTE — Telephone Encounter (Signed)
Pt called for CT results, she received a letter in the mail to call about results. Pt aware of results and pt scheduled for previsit 01/05/15@11am , colon scheduled with Dr. Ardis Hughs in the [facility] 01/07/15@11am . Pt aware of appt.

## 2015-01-05 ENCOUNTER — Encounter: Payer: Self-pay | Admitting: Family Medicine

## 2015-01-05 ENCOUNTER — Ambulatory Visit (AMBULATORY_SURGERY_CENTER): Payer: Self-pay | Admitting: *Deleted

## 2015-01-05 ENCOUNTER — Ambulatory Visit (INDEPENDENT_AMBULATORY_CARE_PROVIDER_SITE_OTHER): Payer: Medicare Other | Admitting: Family Medicine

## 2015-01-05 VITALS — BP 114/62 | HR 56

## 2015-01-05 VITALS — Ht 66.0 in | Wt 152.2 lb

## 2015-01-05 DIAGNOSIS — M170 Bilateral primary osteoarthritis of knee: Secondary | ICD-10-CM

## 2015-01-05 DIAGNOSIS — M1710 Unilateral primary osteoarthritis, unspecified knee: Secondary | ICD-10-CM

## 2015-01-05 DIAGNOSIS — R634 Abnormal weight loss: Secondary | ICD-10-CM

## 2015-01-05 MED ORDER — MOVIPREP 100 G PO SOLR: 1.0000 | Freq: Once | ORAL | Status: DC

## 2015-01-05 NOTE — Progress Notes (Signed)
No home 02 or c pap use No egg or soy allergy No issues with past sedation Pt uses walker but can stand alone , dress  self  No diet pills Pt's procedure is Wednesday 3-9 and just received instructions today. She does states however she has eaten very little i the last few days. Mostly been drinking ensure Son and pt in [place], all questions answered and encouraged to call if further questions. instructed pt and her son many times to not eat any solid foods Tuesday or Wednesday until after the procedure

## 2015-01-05 NOTE — Assessment & Plan Note (Signed)
Patient's will come back in one week for her third out of 4 injections. Discussed with patient this may depend on what is found on her colonoscopy. Then do think that there is a chance the patient may have recurrent colon cancer. Told her that this would be a low priority then. Patient will make the appointment but is understandable if she does not show. All fluid patient will have good news and come back in 1 week.

## 2015-01-05 NOTE — Patient Instructions (Signed)
Good to see you We did injections today Wish you the best with the colonoscopy later this week.  If you can see me again in 1 week.

## 2015-01-05 NOTE — Progress Notes (Signed)
  [name] [address] [place], [place] [address] Phone: [phone] Subjective:      CC:  Bilateral knee pain followup  GYJ:[id number] Shari Mcmahon is a 81 y.o. female coming in for followup for bilateral knee pain. . . Patient previously had been found to have CPPD of the knee and is on Uloric patient also has end-stage osteophytes arthritis of the knees. Patient states that unfortunately her knee pain continues to be painful.  Patient has had significant amount of pain and does have end-stage osteoarthritis but is once again not having surgical intervention. Patient started on Orthovisc injections last week. Patient is here for second in a series of 4 injections.  Patient may have recurrent colon cancer and colonoscopy is scheduled for this week.    Past medical history, social, surgical and family history all reviewed in electronic medical record.   Review of Systems: No headache, visual changes, nausea, vomiting, diarrhea, constipation, dizziness, abdominal pain, skin rash, fevers, chills, night sweats, weight loss, swollen lymph nodes, body aches, joint swelling, muscle aches, chest pain, shortness of breath, mood changes.   Objective Blood pressure 114/62, pulse 56, SpO2 96 %.  General: No apparent distress alert and oriented x3 mood and affect normal, dressed appropriately.  HEENT: Pupils equal, extraocular movements intact  Respiratory: Patient's speak in full sentences and does not appear short of breath  Cardiovascular: No lower extremity edema, non tender, no erythema  Skin: Warm dry intact with no signs of infection or rash on extremities or on axial skeleton.  Abdomen: Soft nontender  Neuro: Cranial nerves II through XII are intact, neurovascularly intact in all extremities with 2+ DTRs and 2+ pulses.  Lymph: No lymphadenopathy of posterior or anterior cervical chain or axillae bilaterally.  Gait normal with good balance and coordination.   MSK: Non tender with full range of motion and good stability and symmetric strength and tone of elbows, wrist, knee and ankles bilaterally. Significant osteoarthritic changes in multiple joints.   Knee: Bilateral On standing patient does have a valgus deformity of the left knee compared to the right knee.Still moderate swelling of the left knee.   Severe tenderness to palpation on the medial joint lines bilaterally left greater then right.  ROM now lacking last 5 degrees of flexion.  Ligaments with solid consistent endpoints including ACL, PCL, LCL, MCL.  painful patellar compression. Patellar glide with severe crepitus. Patellar and quadriceps tendons unremarkable. Hamstring and quadriceps strength is normal.    After informed written and verbal consent, patient was seated on exam table. Left knee was prepped with alcohol swab and utilizing anterolateral approach, patient's left knee space was injected with 15mg /2.5 mL of Orthovisc (sodium hyaluronate) in a prefilled syringe was injected easily into the knee through a 22-gauge needle.. Patient tolerated the procedure well without immediate complications.   After informed written and verbal consent, patient was seated on exam table. Right knee was prepped with alcohol swab and utilizing anterolateral approach, patient's right knee space was injected with 15mg /2.5 mL of Orthovisc (sodium hyaluronate) in a prefilled syringe was injected easily into the knee through a 22-gauge needle.. Patient tolerated the procedure well without immediate complications.    Impression and Recommendations:     This case required medical decision making of moderate complexity.

## 2015-01-05 NOTE — Progress Notes (Signed)
Pre visit review using our clinic review tool, if applicable. No additional management support is needed unless otherwise documented below in the visit note. 

## 2015-01-07 ENCOUNTER — Encounter: Payer: Self-pay | Admitting: Gastroenterology

## 2015-01-07 ENCOUNTER — Ambulatory Visit (AMBULATORY_SURGERY_CENTER): Payer: Medicare Other | Admitting: Gastroenterology

## 2015-01-07 VITALS — BP 147/96 | HR 87 | Temp 96.7°F | Resp 25 | Ht 66.0 in | Wt 152.0 lb

## 2015-01-07 DIAGNOSIS — D122 Benign neoplasm of ascending colon: Secondary | ICD-10-CM | POA: Diagnosis not present

## 2015-01-07 DIAGNOSIS — R933 Abnormal findings on diagnostic imaging of other parts of digestive tract: Secondary | ICD-10-CM | POA: Diagnosis not present

## 2015-01-07 DIAGNOSIS — R634 Abnormal weight loss: Secondary | ICD-10-CM

## 2015-01-07 DIAGNOSIS — D123 Benign neoplasm of transverse colon: Secondary | ICD-10-CM | POA: Diagnosis not present

## 2015-01-07 MED ORDER — SODIUM CHLORIDE 0.9 % IV SOLN: 500.0000 mL | INTRAVENOUS | Status: DC

## 2015-01-07 NOTE — Op Note (Signed)
[facility]  [address]. [place], [id number]   COLONOSCOPY PROCEDURE REPORT  PATIENT: Shari Mcmahon, Shari Mcmahon  MR#: [id number] BIRTHDATE: [birth year] , 29  yrs. old GENDER: female ENDOSCOPIST: Milus Banister, MD PROCEDURE DATE:  01/07/2015 PROCEDURE:   Colonoscopy, diagnostic and Colonoscopy with snare polypectomy First Screening Colonoscopy - Avg.  risk and is 50 yrs.  old or older - No.  Prior Negative Screening - Now for repeat screening. N/A  History of Adenoma - Now for follow-up colonoscopy & has been > or = to 3 yrs.  N/A  Polyps Removed Today? Yes. ASA CLASS:   Class III INDICATIONS:weight loss, abnormal imaging of colon on recent CT. MEDICATIONS: Monitored anesthesia care and Propofol 150 mg IV  DESCRIPTION OF PROCEDURE:   After the risks benefits and alternatives of the procedure were thoroughly explained, informed consent was obtained.  The digital rectal exam revealed no abnormalities of the rectum.   The LB [id number]  endoscope was introduced through the anus and advanced to the cecum, which was identified by both the appendix and ileocecal valve. No adverse events experienced.   The quality of the prep was excellent.  The instrument was then slowly withdrawn as the colon was fully examined.   COLON FINDINGS: Two sessile polyps ranging between 5-17mm in size were found in the transverse colon and ascending colon. Polypectomies were performed with a cold snare.  The resection was complete, the polyp tissue was completely retrieved and sent to histology.  The largest polyp was 55mm, ascending segement (jar 1). The smaller polyp was in transvserse (jar 2).  The examination was otherwise normal.  Retroflexed views revealed no abnormalities. The time to cecum = 3.4 Withdrawal time = 7.6   The scope was withdrawn and the procedure completed. COMPLICATIONS: There were no immediate complications.  ENDOSCOPIC IMPRESSION: 1. Two sessile polyps ranging  between 5-48mm in size were found in the transverse colon and ascending colon; polypectomies were performed with a cold snare 2.   The examination was otherwise normal; There were no concerning, advanced appearing masses or lesions in the colon.  RECOMMENDATIONS: Await final pathology.  Likely will recommend return office visit in 5-6 weeks to check weight, consider other options.  eSigned:  Milus Banister, MD 01/07/2015 11:10 AM

## 2015-01-07 NOTE — Progress Notes (Signed)
Called to room to assist during endoscopic procedure.  Patient ID and intended procedure confirmed with present staff. Received instructions for my participation in the procedure from the performing physician.  

## 2015-01-07 NOTE — Patient Instructions (Signed)
Colon polyps removed today, handout given on polyps. May recommend office visit in 5-6 weeks to check weight.  Call us with any questions or concerns. Thank you!   YOU HAD AN ENDOSCOPIC PROCEDURE TODAY AT [facility]:   Refer to the procedure report that was given to you for any specific questions about what was found during the examination.  If the procedure report does not answer your questions, please call your gastroenterologist to clarify.  If you requested that your care partner not be given the details of your procedure findings, then the procedure report has been included in a sealed envelope for you to review at your convenience later.  YOU SHOULD EXPECT: Some feelings of bloating in the abdomen. Passage of more gas than usual.  Walking can help get rid of the air that was put into your GI tract during the procedure and reduce the bloating. If you had a lower endoscopy (such as a colonoscopy or flexible sigmoidoscopy) you may notice spotting of blood in your stool or on the toilet paper. If you underwent a bowel prep for your procedure, you may not have a normal bowel movement for a few days.  Please Note:  You might notice some irritation and congestion in your nose or some drainage.  This is from the oxygen used during your procedure.  There is no need for concern and it should clear up in a day or so.  SYMPTOMS TO REPORT IMMEDIATELY:   Following lower endoscopy (colonoscopy or flexible sigmoidoscopy):  Excessive amounts of blood in the stool  Significant tenderness or worsening of abdominal pains  Swelling of the abdomen that is new, acute  Fever of 100F or higher   Following upper endoscopy (EGD)  Vomiting of blood or coffee ground material  New chest pain or pain under the shoulder blades  Painful or persistently difficult swallowing  New shortness of breath  Fever of 100F or higher  Black, tarry-looking stools  For urgent or emergent issues, a  gastroenterologist can be reached at any hour by calling [phone].   DIET: Your first meal following the procedure should be a small meal and then it is ok to progress to your normal diet. Heavy or fried foods are harder to digest and may make you feel nauseous or bloated.  Likewise, meals heavy in dairy and vegetables can increase bloating.  Drink plenty of fluids but you should avoid alcoholic beverages for 24 hours.  ACTIVITY:  You should plan to take it easy for the rest of today and you should NOT DRIVE or use heavy machinery until tomorrow (because of the sedation medicines used during the test).    FOLLOW UP: Our staff will call the number listed on your records the next business day following your procedure to check on you and address any questions or concerns that you may have regarding the information given to you following your procedure. If we do not reach you, we will leave a message.  However, if you are feeling well and you are not experiencing any problems, there is no need to return our call.  We will assume that you have returned to your regular daily activities without incident.  If any biopsies were taken you will be contacted by phone or by letter within the next 1-3 weeks.  Please call us at [phone] if you have not heard about the biopsies in 3 weeks.    SIGNATURES/CONFIDENTIALITY: You and/or your care partner have signed  paperwork which will be entered into your electronic medical record.  These signatures attest to the fact that that the information above on your After Visit Summary has been reviewed and is understood.  Full responsibility of the confidentiality of this discharge information lies with you and/or your care-partner.

## 2015-01-07 NOTE — Progress Notes (Signed)
Report to PACU, RN, vss, BBS= Clear.  

## 2015-01-08 ENCOUNTER — Telehealth: Payer: Self-pay | Admitting: *Deleted

## 2015-01-08 NOTE — Telephone Encounter (Signed)
  Follow up Call-  Call back number 01/07/2015 08/15/2014 12/05/2012  Post procedure Call Back phone  # [phone]  Permission to leave phone message Yes Yes Yes     Patient questions:  Do you have a fever, pain , or abdominal swelling? No. Pain Score  0 *  Have you tolerated food without any problems? Yes.    Have you been able to return to your normal activities? Yes.    Do you have any questions about your discharge instructions: Diet   No. Medications  No. Follow up visit  No.  Do you have questions or concerns about your Care? No.  Actions: * If pain score is 4 or above: No action needed, pain <4.

## 2015-01-09 ENCOUNTER — Ambulatory Visit: Payer: Medicare Other | Admitting: Internal Medicine

## 2015-01-12 ENCOUNTER — Telehealth: Payer: Self-pay | Admitting: Family Medicine

## 2015-01-12 NOTE — Telephone Encounter (Signed)
Pt called request Dr.Smith to send something in for a pain. Please call pt

## 2015-01-13 ENCOUNTER — Encounter: Payer: Self-pay | Admitting: Internal Medicine

## 2015-01-13 ENCOUNTER — Ambulatory Visit (INDEPENDENT_AMBULATORY_CARE_PROVIDER_SITE_OTHER): Payer: Medicare Other | Admitting: Internal Medicine

## 2015-01-13 VITALS — BP 118/60 | HR 56 | Temp 98.0°F | Resp 18 | Ht 66.0 in | Wt 156.0 lb

## 2015-01-13 DIAGNOSIS — R2243 Localized swelling, mass and lump, lower limb, bilateral: Secondary | ICD-10-CM

## 2015-01-13 DIAGNOSIS — M10079 Idiopathic gout, unspecified ankle and foot: Secondary | ICD-10-CM

## 2015-01-13 DIAGNOSIS — E119 Type 2 diabetes mellitus without complications: Secondary | ICD-10-CM | POA: Diagnosis not present

## 2015-01-13 DIAGNOSIS — E785 Hyperlipidemia, unspecified: Secondary | ICD-10-CM

## 2015-01-13 DIAGNOSIS — M109 Gout, unspecified: Secondary | ICD-10-CM | POA: Insufficient documentation

## 2015-01-13 MED ORDER — PREDNISONE 20 MG PO TABS: ORAL_TABLET | ORAL | Status: DC

## 2015-01-13 NOTE — Assessment & Plan Note (Signed)
Likely related to acute gout it seems, but cant r/o other - ECG reviewed as per emr, also check BNP, Echo, labs as ordered

## 2015-01-13 NOTE — Assessment & Plan Note (Signed)
stable overall by history and exam, recent data reviewed with pt, and pt to continue medical treatment as before,  to f/u any worsening symptoms or concerns Lab Results  Component Value Date   LDLCALC 76 06/10/2014

## 2015-01-13 NOTE — Telephone Encounter (Signed)
Pt will be seen tomorrow

## 2015-01-13 NOTE — Patient Instructions (Signed)
Your EKG was OK today  You had the steroid shot today  Please take all new medication as prescribed - the prednisone for probable gout in your ankles  You will be contacted regarding the referral for: Echocardiogram to check your heart  Please go to the LAB in the Basement (turn left off the elevator) for the tests to be done today  You will be contacted by phone if any changes need to be made immediately.  Otherwise, you will receive a letter about your results with an explanation, but please check with MyChart first.  Please return in 3 months, or sooner if needed

## 2015-01-13 NOTE — Assessment & Plan Note (Signed)
stable overall by history and exam, recent data reviewed with pt, and pt to continue medical treatment as before,  to f/u any worsening symptoms or concerns le Lab Results  Component Value Date   HGBA1C 6.0 06/10/2014   For f/u lab

## 2015-01-13 NOTE — Progress Notes (Signed)
Subjective:    Patient ID: Shari Mcmahon, female    DOB: [birth year], 81 y.o.   MRN: [id number]  HPI  Here with 2 days onset worsening left > right pain and swelling to distal legs, mostly about the ankles and feet "I woke up and I just cant stand it." Has hx of gout. No fever, trauma or redness - only pain and swelling but mod to severe subjectively,. Also has ongoing knee DJD, sees dr Smith/[facility], had 1/4 orthovisc injections , not improved yet, for second injection tomorrow.  No recent steroid use. Pt denies chest pain, increased sob or doe, wheezing, orthopnea, PND, increased LE swelling, palpitations, dizziness or syncope.  Pt denies new neurological symptoms such as new headache, or facial or extremity weakness or numbness  Does have known mild CKD, as well as recent colonsocpy after abnormal abd ct with colon polpys.  No known hx of significant heart disease Past Medical History  Diagnosis Date  . ABDOMINAL PAIN, CHRONIC 04/07/2008  . ARTHRITIS 03/03/2008  . BRADYCARDIA, CHRONIC 12/17/2008  . CARDIAC MURMUR 02/01/2010  . CONSTIPATION, CHRONIC 10/15/2010  . DEGENERATIVE JOINT DISEASE, KNEES, BILATERAL 12/07/2007  . DEPRESSION 06/17/2009  . DIABETES MELLITUS, TYPE II 09/19/2007    DIET CONTROL   . DYSPHAGIA UNSPECIFIED 12/24/2009  . Gastroparesis 12/24/2009  . GENERALIZED OSTEOARTHROSIS INVOLVING HAND 10/03/2007  . GLAUCOMA 09/19/2007  . GOUTY ARTHROPATHY UNSPECIFIED 02/17/2009  . GOUT 09/19/2007  . HYPERLIPIDEMIA 01/27/2010  . HYPERTENSION 09/19/2007  . LUNG NODULE 03/03/2008  . MENOPAUSAL DISORDER 12/17/2008  . PERIPHERAL NEUROPATHY 06/19/2008  . PERSONAL HISTORY MALIGNANT NEOPLASM STOMACH 09/19/2007  . 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 STOMACH CANCER 03/03/2008  . UTI 12/15/2009  . Arthritis of knee, degenerative 10/19/2011   Past Surgical History  Procedure Laterality Date  . Vagotomy    . Partial gastrectomy    . Billroth ii gastorenterostomy    . Colonoscopy       reports that she has quit smoking. Her smoking use included Cigarettes. She has never used smokeless tobacco. She reports that she does not drink alcohol or use illicit drugs. family history includes Cancer in her sister; Colon cancer in her maternal grandmother and paternal grandfather; Diabetes in her other; Prostate cancer in her brother; Stroke in her father; Uterine cancer in her mother. There is no history of Stomach cancer. Allergies  Allergen Reactions  . Voltaren [Diclofenac Sodium] Other (See Comments)    Stomach irritation  . Dulcolax [Bisacodyl]     Unknown   . Duloxetine Hcl Other (See Comments)    Made patient not want to eat or drink  . Hydrocodone-Acetaminophen     REACTION: Rash  . Penicillins Swelling    Unknown   . Timolol     REACTION: bradycardia worse   Current Outpatient Prescriptions on File Prior to Visit  Medication Sig Dispense Refill  . allopurinol (ZYLOPRIM) 100 MG tablet TAKE 1 TABLET BY MOUTH DAILY. 30 tablet 11  . amLODipine (NORVASC) 5 MG tablet Take 1 tablet (5 mg total) by mouth daily. 90 tablet 3  . febuxostat (ULORIC) 40 MG tablet Take 1 tablet (40 mg total) by mouth daily. 30 tablet 1  . GLIPIZIDE XL 2.5 MG 24 hr tablet TAKE 1 TABELT ONCE A DAY IF NEEDED 30 tablet 1  . glucose blood (FREESTYLE LITE) test strip Use as instructed 100 each 11  . ketoconazole (NIZORAL) 2 % cream Apply 1 application topically daily. 60 g 2  .  latanoprost (XALATAN) 0.005 % ophthalmic solution Place 1 drop into both eyes at bedtime.     Marland Kitchen losartan (COZAAR) 100 MG tablet Take 100 mg by mouth daily.    Marland Kitchen omeprazole (PRILOSEC) 40 MG capsule Take 1 capsule (40 mg total) by mouth daily. 90 capsule 3  . oxyCODONE-acetaminophen (PERCOCET) 5-325 MG per tablet Take 1 tablet by mouth every 6 (six) hours as needed for severe pain. 6 tablet 0  . pravastatin (PRAVACHOL) 10 MG tablet Take 10 mg by mouth every evening.    . predniSONE (DELTASONE) 20 MG tablet 3 tabs po daily x 4  days (Patient taking differently: 3 tabs po daily x 4 days, as needed) 12 tablet 0  . tiZANidine (ZANAFLEX) 4 MG tablet Take 4 mg by mouth every 6 (six) hours as needed for muscle spasms.    . traMADol (ULTRAM) 50 MG tablet Take 50 mg by mouth every 6 (six) hours as needed for moderate pain.     No current facility-administered medications on file prior to visit.   Review of Systems  Constitutional: Negative for unusual diaphoresis or night sweats HENT: Negative for ringing in ear or discharge Eyes: Negative for double vision or worsening visual disturbance.  Respiratory: Negative for choking and stridor.   Gastrointestinal: Negative for vomiting or other signifcant bowel change Genitourinary: Negative for hematuria or change in urine volume.  Musculoskeletal: Negative for other MSK pain or swelling Skin: Negative for color change and worsening wound.  Neurological: Negative for tremors and numbness other than noted  Psychiatric/Behavioral: Negative for decreased concentration or agitation other than above       Objective:   Physical Exam BP 118/60 mmHg  Pulse 56  Temp(Src) 98 F (36.7 C) (Oral)  Resp 18  Ht 5\' 6"  (1.676 m)  Wt 156 lb 0.6 oz (70.779 kg)  BMI 25.20 kg/m2  SpO2 96% VS noted,  Constitutional: Pt appears in no significant distress HENT: Head: NCAT.  Right Ear: External ear normal.  Left Ear: External ear normal.  Eyes: . Pupils are equal, round, and reactive to light. Conjunctivae and EOM are normal Neck: Normal range of motion. Neck supple.  Cardiovascular: Normal rate and regular rhythm.   Pulmonary/Chest: Effort normal and breath sounds without rales or wheezing.  Neurological: Pt is alert. Not confused , motor grossly intact Skin: Skin is warm. No rash, no LE edema Psychiatric: Pt behavior is normal. No agitation.  Bilat knees with severe deg changes, small effusions, NT, no erythema Left > right ankle effusion 1-2+ with marked tender, no erythema Legs  with 1+ edema left > right to knees    Assessment & Plan:

## 2015-01-13 NOTE — Addendum Note (Signed)
Addended by: Valerie Salts on: 01/13/2015 11:09 AM   Modules accepted: Orders

## 2015-01-13 NOTE — Assessment & Plan Note (Signed)
For depomedrol IM, predpac asd, check uric acid,  to f/u any worsening symptoms or concerns

## 2015-01-14 ENCOUNTER — Encounter: Payer: Self-pay | Admitting: Family Medicine

## 2015-01-14 ENCOUNTER — Ambulatory Visit (INDEPENDENT_AMBULATORY_CARE_PROVIDER_SITE_OTHER): Payer: Medicare Other | Admitting: Family Medicine

## 2015-01-14 VITALS — BP 126/62 | HR 58 | Wt 152.0 lb

## 2015-01-14 DIAGNOSIS — M1710 Unilateral primary osteoarthritis, unspecified knee: Secondary | ICD-10-CM

## 2015-01-14 DIAGNOSIS — M1712 Unilateral primary osteoarthritis, left knee: Secondary | ICD-10-CM

## 2015-01-14 DIAGNOSIS — M1711 Unilateral primary osteoarthritis, right knee: Secondary | ICD-10-CM

## 2015-01-14 NOTE — Patient Instructions (Signed)
Good to see you Ice can still help I am hoping this does make a difference There could be one more injection next week.

## 2015-01-14 NOTE — Progress Notes (Signed)
Pre visit review using our clinic review tool, if applicable. No additional management support is needed unless otherwise documented below in the visit note. 

## 2015-01-14 NOTE — Assessment & Plan Note (Signed)
3/4 injection today Discussed icing regimen,patient will continue with the topical anti-inflammatories. Patient will come back and see me again in 1 week for fourth injection.

## 2015-01-14 NOTE — Progress Notes (Signed)
  [name] [address], [place] [address] Phone: [phone] Subjective:      CC:  Bilateral knee pain followup  [id number] Shari Mcmahon is a 81 y.o. female coming in for followup for bilateral knee pain. . . Patient previously had been found to have CPPD of the knee and is on Uloric patient also has end-stage osteophytes arthritis of the knees. Patient states that unfortunately her knee pain continues to be painful.  Patient has had significant amount of pain and does have end-stage osteoarthritis but is once again not having surgical intervention. Patient started on Orthovisc injections last week. Patient is here for 3rd in a series of 4 injections.      Past medical history, social, surgical and family history all reviewed in electronic medical record.   Review of Systems: No headache, visual changes, nausea, vomiting, diarrhea, constipation, dizziness, abdominal pain, skin rash, fevers, chills, night sweats, weight loss, swollen lymph nodes, body aches, joint swelling, muscle aches, chest pain, shortness of breath, mood changes.   Objective Blood pressure 126/62, pulse 58, weight 152 lb (68.947 kg), SpO2 94 %.  General: No apparent distress alert and oriented x3 mood and affect normal, dressed appropriately.  HEENT: Pupils equal, extraocular movements intact  Respiratory: Patient's speak in full sentences and does not appear short of breath  Cardiovascular: No lower extremity edema, non tender, no erythema  Skin: Warm dry intact with no signs of infection or rash on extremities or on axial skeleton.  Abdomen: Soft nontender  Neuro: Cranial nerves II through XII are intact, neurovascularly intact in all extremities with 2+ DTRs and 2+ pulses.  Lymph: No lymphadenopathy of posterior or anterior cervical chain or axillae bilaterally.  Gait normal with good balance and coordination.  MSK: Non tender with full range of motion and good  stability and symmetric strength and tone of elbows, wrist, knee and ankles bilaterally. Significant osteoarthritic changes in multiple joints.   Knee: Bilateral On standing patient does have a valgus deformity of the left knee compared to the right knee.Still moderate swelling of the left knee.   Severe tenderness to palpation on the medial joint lines bilaterally left greater then right.  ROM now lacking last 5 degrees of flexion.  Ligaments with solid consistent endpoints including ACL, PCL, LCL, MCL.  painful patellar compression. Patellar glide with severe crepitus. Patellar and quadriceps tendons unremarkable. Hamstring and quadriceps strength is normal.    After informed written and verbal consent, patient was seated on exam table. Left knee was prepped with alcohol swab and utilizing anterolateral approach, patient's left knee space was injected with 15mg /2.5 mL of Orthovisc (sodium hyaluronate) in a prefilled syringe was injected easily into the knee through a 22-gauge needle.. Patient tolerated the procedure well without immediate complications.   After informed written and verbal consent, patient was seated on exam table. Right knee was prepped with alcohol swab and utilizing anterolateral approach, patient's right knee space was injected with 15mg /2.5 mL of Orthovisc (sodium hyaluronate) in a prefilled syringe was injected easily into the knee through a 22-gauge needle.. Patient tolerated the procedure well without immediate complications.    Impression and Recommendations:     This case required medical decision making of moderate complexity.

## 2015-01-22 ENCOUNTER — Ambulatory Visit (INDEPENDENT_AMBULATORY_CARE_PROVIDER_SITE_OTHER): Payer: Medicare Other | Admitting: Family Medicine

## 2015-01-22 ENCOUNTER — Encounter: Payer: Self-pay | Admitting: Family Medicine

## 2015-01-22 VITALS — BP 112/70 | HR 62 | Wt 150.0 lb

## 2015-01-22 DIAGNOSIS — M1710 Unilateral primary osteoarthritis, unspecified knee: Secondary | ICD-10-CM

## 2015-01-22 DIAGNOSIS — M170 Bilateral primary osteoarthritis of knee: Secondary | ICD-10-CM

## 2015-01-22 NOTE — Progress Notes (Signed)
Pre visit review using our clinic review tool, if applicable. No additional management support is needed unless otherwise documented below in the visit note. 

## 2015-01-22 NOTE — Patient Instructions (Addendum)
Good to see you If you are doing uloric I would increase it to try to keep you form the gout flares.  Ice still can help See you in 1 month.

## 2015-01-22 NOTE — Progress Notes (Signed)
  [name] [address], [place] [address] Phone: [phone] Subjective:     CC:  Bilateral knee pain followup  [id number] Shari Mcmahon is a 81 y.o. female coming in for followup for bilateral knee pain. . . Patient previously had been found to have CPPD of the knee and is on Uloric patient also has end-stage osteophytes arthritis of the knees. Patient states that unfortunately her knee pain continues to be painful likely has increasing some activity.  Patient has had significant amount of pain and does have end-stage osteoarthritis but is once again not having surgical intervention. Patient started on Orthovisc injections last week. Patient is here for her fourth injection into these bilaterally.  Continue to have trouble with appetite.       Past medical history, social, surgical and family history all reviewed in electronic medical record.   Review of Systems: No headache, visual changes, nausea, vomiting, diarrhea, constipation, dizziness, abdominal pain, skin rash, fevers, chills, night sweats, weight loss, swollen lymph nodes, body aches, joint swelling, muscle aches, chest pain, shortness of breath, mood changes.   Objective Blood pressure 112/70, pulse 62, weight 150 lb (68.04 kg), SpO2 99 %.  General: No apparent distress alert and oriented x3 mood and affect normal, dressed appropriately.  HEENT: Pupils equal, extraocular movements intact  Respiratory: Patient's speak in full sentences and does not appear short of breath  Cardiovascular: No lower extremity edema, non tender, no erythema  Skin: Warm dry intact with no signs of infection or rash on extremities or on axial skeleton.  Abdomen: Soft nontender  Neuro: Cranial nerves II through XII are intact, neurovascularly intact in all extremities with 2+ DTRs and 2+ pulses.  Lymph: No lymphadenopathy of posterior or anterior cervical chain or axillae bilaterally.  Gait normal with  good balance and coordination.  MSK: Non tender with full range of motion and good stability and symmetric strength and tone of elbows, wrist, knee and ankles bilaterally. Significant osteoarthritic changes in multiple joints.   Knee: Bilateral On standing patient does have a valgus deformity of the left knee compared to the right knee.Still moderate swelling of the left knee.   Severe tenderness to palpation on the medial joint lines bilaterally left greater then right.  ROM now lacking last 5 degrees of flexion.  Ligaments with solid consistent endpoints including ACL, PCL, LCL, MCL.  painful patellar compression. Patellar glide with severe crepitus. Patellar and quadriceps tendons unremarkable. Hamstring and quadriceps strength is normal.    After informed written and verbal consent, patient was seated on exam table. Left knee was prepped with alcohol swab and utilizing anterolateral approach, patient's left knee space was injected with 15mg /2.5 mL of Orthovisc (sodium hyaluronate) in a prefilled syringe was injected easily into the knee through a 22-gauge needle.. Patient tolerated the procedure well without immediate complications.   After informed written and verbal consent, patient was seated on exam table. Right knee was prepped with alcohol swab and utilizing anterolateral approach, patient's right knee space was injected with 15mg /2.5 mL of Orthovisc (sodium hyaluronate) in a prefilled syringe was injected easily into the knee through a 22-gauge needle.. Patient tolerated the procedure well without immediate complications.    Impression and Recommendations:     This case required medical decision making of moderate complexity.

## 2015-01-22 NOTE — Assessment & Plan Note (Signed)
Patient finish the Orthovisc series and will follow-up again in 4 weeks. Encourage patient to continue the pain medications that she has on hand as well as the icing protocol. Patient knows that she would need replacement if she went and more pain control but patient is not interested in this.

## 2015-02-03 ENCOUNTER — Encounter: Payer: Medicare Other | Admitting: Internal Medicine

## 2015-02-12 ENCOUNTER — Other Ambulatory Visit: Payer: Self-pay | Admitting: Internal Medicine

## 2015-02-16 ENCOUNTER — Telehealth: Payer: Self-pay | Admitting: Family Medicine

## 2015-02-16 NOTE — Telephone Encounter (Signed)
Patient called in advising that she has suffered all weekend and requests to be seen sooner than 02/25/2015. She states that she will have to go to the ER if we cant get her in. Do we have any openings to fit the patient in?

## 2015-02-17 NOTE — Telephone Encounter (Signed)
Spoke to Shari Mcmahon & advised her that it was too soon for another injection & dr. Tamala Julian recommended her to go & talk to someone about possible surgery since the injections are not helping. Shari Mcmahon declined. She scheduled an appt for 4.27.16 when she can get another injection.

## 2015-02-18 ENCOUNTER — Encounter (HOSPITAL_COMMUNITY): Payer: Self-pay

## 2015-02-18 ENCOUNTER — Emergency Department (HOSPITAL_COMMUNITY)
Admission: EM | Admit: 2015-02-18 | Discharge: 2015-02-18 | Disposition: A | Discharge status: Home / Self Care | Payer: Medicare Other | Attending: Emergency Medicine | Admitting: Emergency Medicine

## 2015-02-18 DIAGNOSIS — Z7952 Long term (current) use of systemic steroids: Secondary | ICD-10-CM | POA: Diagnosis not present

## 2015-02-18 DIAGNOSIS — Z880 Allergy status to penicillin: Secondary | ICD-10-CM | POA: Insufficient documentation

## 2015-02-18 DIAGNOSIS — M1009 Idiopathic gout, multiple sites: Secondary | ICD-10-CM | POA: Diagnosis not present

## 2015-02-18 DIAGNOSIS — D649 Anemia, unspecified: Secondary | ICD-10-CM | POA: Insufficient documentation

## 2015-02-18 DIAGNOSIS — G8929 Other chronic pain: Secondary | ICD-10-CM | POA: Diagnosis not present

## 2015-02-18 DIAGNOSIS — H409 Unspecified glaucoma: Secondary | ICD-10-CM | POA: Insufficient documentation

## 2015-02-18 DIAGNOSIS — M79605 Pain in left leg: Secondary | ICD-10-CM | POA: Diagnosis not present

## 2015-02-18 DIAGNOSIS — E119 Type 2 diabetes mellitus without complications: Secondary | ICD-10-CM | POA: Insufficient documentation

## 2015-02-18 DIAGNOSIS — M1990 Unspecified osteoarthritis, unspecified site: Secondary | ICD-10-CM | POA: Diagnosis not present

## 2015-02-18 DIAGNOSIS — I10 Essential (primary) hypertension: Secondary | ICD-10-CM | POA: Diagnosis not present

## 2015-02-18 DIAGNOSIS — Z87891 Personal history of nicotine dependence: Secondary | ICD-10-CM | POA: Insufficient documentation

## 2015-02-18 DIAGNOSIS — E785 Hyperlipidemia, unspecified: Secondary | ICD-10-CM | POA: Insufficient documentation

## 2015-02-18 DIAGNOSIS — M79606 Pain in leg, unspecified: Secondary | ICD-10-CM

## 2015-02-18 DIAGNOSIS — R630 Anorexia: Secondary | ICD-10-CM | POA: Diagnosis not present

## 2015-02-18 DIAGNOSIS — Z79899 Other long term (current) drug therapy: Secondary | ICD-10-CM | POA: Insufficient documentation

## 2015-02-18 DIAGNOSIS — Z85028 Personal history of other malignant neoplasm of stomach: Secondary | ICD-10-CM | POA: Diagnosis not present

## 2015-02-18 DIAGNOSIS — R011 Cardiac murmur, unspecified: Secondary | ICD-10-CM | POA: Insufficient documentation

## 2015-02-18 DIAGNOSIS — Z87440 Personal history of urinary (tract) infections: Secondary | ICD-10-CM | POA: Diagnosis not present

## 2015-02-18 DIAGNOSIS — M25561 Pain in right knee: Secondary | ICD-10-CM | POA: Insufficient documentation

## 2015-02-18 DIAGNOSIS — M25562 Pain in left knee: Secondary | ICD-10-CM | POA: Diagnosis not present

## 2015-02-18 DIAGNOSIS — Z8719 Personal history of other diseases of the digestive system: Secondary | ICD-10-CM | POA: Diagnosis not present

## 2015-02-18 LAB — CBC WITH DIFFERENTIAL/PLATELET
Basophils Absolute: 0 10*3/uL (ref 0.0–0.1)
Basophils Relative: 0 % (ref 0–1)
EOS PCT: 2 % (ref 0–5)
Eosinophils Absolute: 0.2 10*3/uL (ref 0.0–0.7)
HCT: 30.9 % — ABNORMAL LOW (ref 36.0–46.0)
Hemoglobin: 10.3 g/dL — ABNORMAL LOW (ref 12.0–15.0)
LYMPHS ABS: 2.1 10*3/uL (ref 0.7–4.0)
Lymphocytes Relative: 22 % (ref 12–46)
MCH: 33.3 pg (ref 26.0–34.0)
MCHC: 33.3 g/dL (ref 30.0–36.0)
MCV: 100 fL (ref 78.0–100.0)
Monocytes Absolute: 1.4 10*3/uL — ABNORMAL HIGH (ref 0.1–1.0)
Monocytes Relative: 15 % — ABNORMAL HIGH (ref 3–12)
NEUTROS ABS: 5.8 10*3/uL (ref 1.7–7.7)
Neutrophils Relative %: 61 % (ref 43–77)
Platelets: 194 10*3/uL (ref 150–400)
RBC: 3.09 MIL/uL — ABNORMAL LOW (ref 3.87–5.11)
RDW: 15.7 % — ABNORMAL HIGH (ref 11.5–15.5)
WBC: 9.5 10*3/uL (ref 4.0–10.5)

## 2015-02-18 LAB — COMPREHENSIVE METABOLIC PANEL
ALT: 12 U/L (ref 0–35)
AST: 22 U/L (ref 0–37)
Albumin: 2.9 g/dL — ABNORMAL LOW (ref 3.5–5.2)
Alkaline Phosphatase: 78 U/L (ref 39–117)
Anion gap: 8 (ref 5–15)
BUN: 17 mg/dL (ref 6–23)
CALCIUM: 9.2 mg/dL (ref 8.4–10.5)
CO2: 26 mmol/L (ref 19–32)
CREATININE: 1.59 mg/dL — AB (ref 0.50–1.10)
Chloride: 106 mmol/L (ref 96–112)
GFR, EST AFRICAN AMERICAN: 33 mL/min — AB (ref >90)
GFR, EST NON AFRICAN AMERICAN: 28 mL/min — AB (ref >90)
Glucose, Bld: 93 mg/dL (ref 70–99)
Potassium: 4.1 mmol/L (ref 3.5–5.1)
Sodium: 140 mmol/L (ref 135–145)
TOTAL PROTEIN: 6.2 g/dL (ref 6.0–8.3)
Total Bilirubin: 0.8 mg/dL (ref 0.3–1.2)

## 2015-02-18 MED ORDER — ACETAMINOPHEN 325 MG PO TABS
650.0000 mg | ORAL_TABLET | Freq: Once | ORAL | Status: AC
  Administered 2015-02-18: 650 mg via ORAL
  Filled 2015-02-18: qty 2

## 2015-02-18 NOTE — Discharge Instructions (Signed)
Is okay to take Tylenol 650 mg every 4 hours as needed for pain. Keep your scheduled appointment with Dr. Tamala Julian tomorrow. Your kidney function is slightly worse than February 2016. Blood creatinine today is 1.59. You are also mildly anemic. Hemoglobin is 10.3. Please show these discharge instructions to Dr. Tamala Julian

## 2015-02-18 NOTE — ED Provider Notes (Signed)
CSN: [id number]     Arrival date & time 02/18/15  1153 History   First MD Initiated Contact with Patient 02/18/15 1227     Chief Complaint  Patient presents with  . Leg Pain     (Consider location/radiation/quality/duration/timing/severity/associated sxs/prior Treatment) HPI Patient complains of bilateral knee pain and bilateral ankle pain for the past several months, becoming worse over the past 4-5 days. No injury no fever no other associated symptoms. She treated herself Ultram 8 AM today without relief. No injury. Pain is worse with weightbearing improved with nonweightbearing. No other associated symptoms. Past Medical History  Diagnosis Date  . ABDOMINAL PAIN, CHRONIC 04/07/2008  . ARTHRITIS 03/03/2008  . BRADYCARDIA, CHRONIC 12/17/2008  . CARDIAC MURMUR 02/01/2010  . CONSTIPATION, CHRONIC 10/15/2010  . DEGENERATIVE JOINT DISEASE, KNEES, BILATERAL 12/07/2007  . DEPRESSION 06/17/2009  . DIABETES MELLITUS, TYPE II 09/19/2007    DIET CONTROL   . DYSPHAGIA UNSPECIFIED 12/24/2009  . Gastroparesis 12/24/2009  . GENERALIZED OSTEOARTHROSIS INVOLVING HAND 10/03/2007  . GLAUCOMA 09/19/2007  . GOUTY ARTHROPATHY UNSPECIFIED 02/17/2009  . GOUT 09/19/2007  . HYPERLIPIDEMIA 01/27/2010  . HYPERTENSION 09/19/2007  . LUNG NODULE 03/03/2008  . MENOPAUSAL DISORDER 12/17/2008  . PERIPHERAL NEUROPATHY 06/19/2008  . PERSONAL HISTORY MALIGNANT NEOPLASM STOMACH 09/19/2007  . 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 STOMACH CANCER 03/03/2008  . UTI 12/15/2009  . Arthritis of knee, degenerative 10/19/2011   Past Surgical History  Procedure Laterality Date  . Vagotomy    . Partial gastrectomy    . Billroth ii gastorenterostomy    . Colonoscopy     Family History  Problem Relation Age of Onset  . Uterine cancer Mother   . Stroke Father     Hemorrhagic  . Cancer Sister     breast cancer and one sister with uterine cancer and one sister with ovarian cancerr  . Colon cancer Maternal Grandmother    . Colon cancer Paternal Grandfather   . Diabetes Other   . Stomach cancer Neg Hx   . Prostate cancer Brother    History  Substance Use Topics  . Smoking status: Former Smoker    Types: Cigarettes  . Smokeless tobacco: Never Used     Comment: use to smoke a pack of cigaretts a day for 25 yrs. but stopped 10 yrs. ago.   . Alcohol Use: No   OB History    No data available     Review of Systems  Constitutional: Positive for appetite change.       Diminished appetite over the past 4-5 months.  HENT: Negative.   Respiratory: Negative.   Cardiovascular: Negative.   Gastrointestinal: Negative.  Negative for blood in stool.       Denies black stools or blood in stool  Musculoskeletal: Positive for arthralgias and gait problem.       Walks with walker  Skin: Negative.   Neurological: Negative.   Psychiatric/Behavioral: Negative.   All other systems reviewed and are negative.     Allergies  Voltaren; Dulcolax; Duloxetine hcl; Hydrocodone-acetaminophen; Penicillins; and Timolol  Home Medications   Prior to Admission medications   Medication Sig Start Date End Date Taking? Authorizing Provider  allopurinol (ZYLOPRIM) 100 MG tablet TAKE 1 TABLET BY MOUTH DAILY. 10/20/14   Biagio Borg, MD  amLODipine (NORVASC) 5 MG tablet Take 1 tablet (5 mg total) by mouth daily. 02/26/14 02/26/15  Biagio Borg, MD  febuxostat (ULORIC) 40 MG tablet Take 1 tablet (40 mg total)  by mouth daily. 10/17/14   Lyndal Pulley, DO  GLIPIZIDE XL 2.5 MG 24 hr tablet TAKE 1 TABELT ONCE A DAY IF NEEDED 09/05/14   Biagio Borg, MD  glucose blood (FREESTYLE LITE) test strip Use as instructed 04/29/14   Biagio Borg, MD  ketoconazole (NIZORAL) 2 % cream Apply 1 application topically daily. 12/05/14   Trula Slade, DPM  latanoprost (XALATAN) 0.005 % ophthalmic solution Place 1 drop into both eyes at bedtime.  07/28/14   Historical Provider, MD  losartan (COZAAR) 100 MG tablet Take 100 mg by mouth daily.     Historical Provider, MD  losartan (COZAAR) 100 MG tablet TAKE 1 TABLET BY MOUTH ONCE DAILY. 02/12/15   Biagio Borg, MD  omeprazole (PRILOSEC) 40 MG capsule Take 1 capsule (40 mg total) by mouth daily. 08/15/14   Milus Banister, MD  oxyCODONE-acetaminophen (PERCOCET) 5-325 MG per tablet Take 1 tablet by mouth every 6 (six) hours as needed for severe pain. 10/28/14   Mercedes Camprubi-Soms, PA-C  pravastatin (PRAVACHOL) 10 MG tablet Take 10 mg by mouth every evening.    Historical Provider, MD  predniSONE (DELTASONE) 20 MG tablet 3 tabs po daily x 4 days 01/13/15   Biagio Borg, MD  tiZANidine (ZANAFLEX) 4 MG tablet Take 4 mg by mouth every 6 (six) hours as needed for muscle spasms.    Historical Provider, MD  traMADol (ULTRAM) 50 MG tablet Take 50 mg by mouth every 6 (six) hours as needed for moderate pain.    Historical Provider, MD   BP 108/50 mmHg  Pulse 59  Temp(Src) 98.2 F (36.8 C) (Oral)  Resp 18  Ht 5\' 6"  (1.676 m)  Wt 152 lb (68.947 kg)  BMI 24.55 kg/m2  SpO2 100% Physical Exam  Constitutional: She is oriented to person, place, and time. She appears well-developed and well-nourished. No distress.  Chronically ill-appearing  HENT:  Head: Normocephalic and atraumatic.  Eyes: Conjunctivae are normal. Pupils are equal, round, and reactive to light.  Neck: Neck supple. No tracheal deviation present. No thyromegaly present.  Cardiovascular: Normal rate and regular rhythm.   No murmur heard. Pulmonary/Chest: Effort normal and breath sounds normal.  Abdominal: Soft. Bowel sounds are normal. She exhibits no distension. There is no tenderness.  Musculoskeletal: Normal range of motion. She exhibits no edema or tenderness.  Bilateral lower extremities with knees this mildly tender with crepitance on active or passive movement. No ecchymosis no redness or warmth. Ankle is nontender. Mild pain with active motion.  Neurological: She is alert and oriented to person, place, and time. No cranial  nerve deficit. Coordination normal.  Able to walk with minimal assistance.  Skin: Skin is warm and dry. No rash noted.  Psychiatric: She has a normal mood and affect.  Nursing note and vitals reviewed.   ED Course  Procedures (including critical care time) Labs Review Labs Reviewed - No data to display  Imaging Review No results found.   EKG Interpretation None     3:10 PM feels improved after treatment with Tylenol Results for orders placed or performed during the hospital encounter of 02/18/15  Comprehensive metabolic panel  Result Value Ref Range   Sodium 140 135 - 145 mmol/L   Potassium 4.1 3.5 - 5.1 mmol/L   Chloride 106 96 - 112 mmol/L   CO2 26 19 - 32 mmol/L   Glucose, Bld 93 70 - 99 mg/dL   BUN 17 6 - 23 mg/dL  Creatinine, Ser 1.59 (H) 0.50 - 1.10 mg/dL   Calcium 9.2 8.4 - 10.5 mg/dL   Total Protein 6.2 6.0 - 8.3 g/dL   Albumin 2.9 (L) 3.5 - 5.2 g/dL   AST 22 0 - 37 U/L   ALT 12 0 - 35 U/L   Alkaline Phosphatase 78 39 - 117 U/L   Total Bilirubin 0.8 0.3 - 1.2 mg/dL   GFR calc non Af Amer 28 (L) >90 mL/min   GFR calc Af Amer 33 (L) >90 mL/min   Anion gap 8 5 - 15  CBC with Differential/Platelet  Result Value Ref Range   WBC 9.5 4.0 - 10.5 K/uL   RBC 3.09 (L) 3.87 - 5.11 MIL/uL   Hemoglobin 10.3 (L) 12.0 - 15.0 g/dL   HCT 30.9 (L) 36.0 - 46.0 %   MCV 100.0 78.0 - 100.0 fL   MCH 33.3 26.0 - 34.0 pg   MCHC 33.3 30.0 - 36.0 g/dL   RDW 15.7 (H) 11.5 - 15.5 %   Platelets 194 150 - 400 K/uL   Neutrophils Relative % 61 43 - 77 %   Lymphocytes Relative 22 12 - 46 %   Monocytes Relative 15 (H) 3 - 12 %   Eosinophils Relative 2 0 - 5 %   Basophils Relative 0 0 - 1 %   Neutro Abs 5.8 1.7 - 7.7 K/uL   Lymphs Abs 2.1 0.7 - 4.0 K/uL   Monocytes Absolute 1.4 (H) 0.1 - 1.0 K/uL   Eosinophils Absolute 0.2 0.0 - 0.7 K/uL   Basophils Absolute 0.0 0.0 - 0.1 K/uL   RBC Morphology TARGET CELLS    WBC Morphology ATYPICAL LYMPHOCYTES    No results found.  MDM  Pain  felt to be secondary to degenerative arthritis. Patient mildly anemic. Denies black stools are black bowel movements Final diagnoses:  None   Plan keep scheduled appointment with Dr. Tamala Julian tomorrow  Tylenol for pain. Diagnosis #1 lower extremity pain #2 anemia  #3 chronic renal insufficiency   Orlie Dakin, MD 02/18/15 [phone]

## 2015-02-18 NOTE — ED Notes (Signed)
Pt presents with 3 day h/o swelling and pain to both legs.  Pt denies any injury, reports h/o arthritis, gout and neuropathy.  Pt denies falling, reports able to bear weight, but "barely".   Pt also reports 1 month h/o no appetite and nausea.

## 2015-02-19 ENCOUNTER — Ambulatory Visit: Payer: Medicare Other | Admitting: Family Medicine

## 2015-02-19 ENCOUNTER — Other Ambulatory Visit (HOSPITAL_COMMUNITY): Payer: Self-pay | Admitting: Internal Medicine

## 2015-02-19 ENCOUNTER — Ambulatory Visit (HOSPITAL_COMMUNITY): Payer: Medicare Other | Attending: Cardiology | Admitting: Radiology

## 2015-02-19 DIAGNOSIS — R600 Localized edema: Secondary | ICD-10-CM | POA: Diagnosis not present

## 2015-02-19 DIAGNOSIS — R609 Edema, unspecified: Secondary | ICD-10-CM | POA: Insufficient documentation

## 2015-02-19 DIAGNOSIS — E785 Hyperlipidemia, unspecified: Secondary | ICD-10-CM

## 2015-02-19 NOTE — Progress Notes (Signed)
Echocardiogram performed.  

## 2015-02-21 ENCOUNTER — Other Ambulatory Visit: Payer: Self-pay | Admitting: Internal Medicine

## 2015-02-25 ENCOUNTER — Other Ambulatory Visit (INDEPENDENT_AMBULATORY_CARE_PROVIDER_SITE_OTHER): Payer: Medicare Other

## 2015-02-25 ENCOUNTER — Ambulatory Visit (INDEPENDENT_AMBULATORY_CARE_PROVIDER_SITE_OTHER): Payer: Medicare Other | Admitting: Family Medicine

## 2015-02-25 ENCOUNTER — Telehealth: Payer: Self-pay | Admitting: Family Medicine

## 2015-02-25 ENCOUNTER — Encounter: Payer: Self-pay | Admitting: Family Medicine

## 2015-02-25 VITALS — BP 130/78 | HR 63 | Ht 66.0 in | Wt 146.0 lb

## 2015-02-25 DIAGNOSIS — M1711 Unilateral primary osteoarthritis, right knee: Secondary | ICD-10-CM

## 2015-02-25 DIAGNOSIS — D649 Anemia, unspecified: Secondary | ICD-10-CM | POA: Insufficient documentation

## 2015-02-25 DIAGNOSIS — M1712 Unilateral primary osteoarthritis, left knee: Secondary | ICD-10-CM

## 2015-02-25 DIAGNOSIS — R634 Abnormal weight loss: Secondary | ICD-10-CM | POA: Diagnosis not present

## 2015-02-25 DIAGNOSIS — M1710 Unilateral primary osteoarthritis, unspecified knee: Secondary | ICD-10-CM

## 2015-02-25 DIAGNOSIS — N289 Disorder of kidney and ureter, unspecified: Secondary | ICD-10-CM

## 2015-02-25 DIAGNOSIS — D500 Iron deficiency anemia secondary to blood loss (chronic): Secondary | ICD-10-CM | POA: Diagnosis not present

## 2015-02-25 HISTORY — DX: Anemia, unspecified: D64.9

## 2015-02-25 LAB — CBC WITH DIFFERENTIAL/PLATELET
Basophils Absolute: 0.1 10*3/uL (ref 0.0–0.1)
Basophils Relative: 1.8 % (ref 0.0–3.0)
EOS ABS: 0.1 10*3/uL (ref 0.0–0.7)
Eosinophils Relative: 1.7 % (ref 0.0–5.0)
HEMATOCRIT: 32.4 % — AB (ref 36.0–46.0)
Hemoglobin: 10.8 g/dL — ABNORMAL LOW (ref 12.0–15.0)
Lymphocytes Relative: 16.9 % (ref 12.0–46.0)
Lymphs Abs: 1.3 10*3/uL (ref 0.7–4.0)
MCHC: 33.4 g/dL (ref 30.0–36.0)
MCV: 101.1 fl — ABNORMAL HIGH (ref 78.0–100.0)
MONO ABS: 0.7 10*3/uL (ref 0.1–1.0)
Monocytes Relative: 9.5 % (ref 3.0–12.0)
NEUTROS PCT: 70.1 % (ref 43.0–77.0)
Neutro Abs: 5.3 10*3/uL (ref 1.4–7.7)
PLATELETS: 258 10*3/uL (ref 150.0–400.0)
RBC: 3.2 Mil/uL — ABNORMAL LOW (ref 3.87–5.11)
RDW: 16.7 % — AB (ref 11.5–15.5)
WBC: 7.6 10*3/uL (ref 4.0–10.5)

## 2015-02-25 LAB — BASIC METABOLIC PANEL
BUN: 16 mg/dL (ref 6–23)
CALCIUM: 9.5 mg/dL (ref 8.4–10.5)
CO2: 26 mEq/L (ref 19–32)
Chloride: 107 mEq/L (ref 96–112)
Creatinine, Ser: 1.79 mg/dL — ABNORMAL HIGH (ref 0.40–1.20)
GFR: 34.51 mL/min — ABNORMAL LOW (ref >60.00)
GLUCOSE: 92 mg/dL (ref 70–99)
POTASSIUM: 4 meq/L (ref 3.5–5.1)
Sodium: 138 mEq/L (ref 135–145)

## 2015-02-25 MED ORDER — MIRTAZAPINE 15 MG PO TABS: 7.5000 mg | ORAL_TABLET | Freq: Every day | ORAL | Status: DC

## 2015-02-25 NOTE — Telephone Encounter (Signed)
States Dr. Tamala Julian was going to tell her what vitamins to start taking.  Patient does not remember what he said.

## 2015-02-25 NOTE — Progress Notes (Signed)
Pre visit review using our clinic review tool, if applicable. No additional management support is needed unless otherwise documented below in the visit note. 

## 2015-02-25 NOTE — Telephone Encounter (Signed)
Discussed with pt

## 2015-02-25 NOTE — Progress Notes (Signed)
[name] [address], [place] [address] Phone: [phone] Subjective:     CC:  Bilateral knee pain followup  EOF:[id number] Shari Mcmahon is a 81 y.o. female coming in for followup for bilateral knee pain. . . Patient previously had been found to have CPPD of the knee a and did try different gout medications with minimal benefit. Patient is on allopurinol 100 mg at this time. Patient states that unfortunately her knee pain continues to be painful likely has increasing some activity.  Patient has had significant amount of pain and does have end-stage osteoarthritis but is once again not having surgical intervention. Patient finished Orthovisc 4 weeks ago and continues to have pain unfortunately. Having difficulty with ambulation and continues to use a walker.  Continue to have trouble with appetite. Patient is doing ensure twice daily but is having difficulty. Patient states that she does not does not feel hungry overall. Patient usually tries to force herself to 8 but has difficulty sometimes remembering. Patient is living alone most of the time. Denies any significant depression. Denies any suicidal or homicidal ideation. Patient just complains of chronic pain.  Patient was seen in the emergency department and did have a creatinine bump up to 1.590 is likely secondary to more of a dehydration. Patient also has some atypical lymphocytes on her CBC within anemia is also secondary likely to a malnutrition and iron deficiency anemia.      Past medical history, social, surgical and family history all reviewed in electronic medical record.   Review of Systems: No headache, visual changes, nausea, vomiting, diarrhea, constipation, dizziness, abdominal pain, skin rash, fevers, chills, night sweats, weight loss, swollen lymph nodes, body aches, joint swelling, muscle aches, chest pain, shortness of breath, mood changes.   Objective Blood pressure 130/78,  pulse 63, height 5\' 6"  (1.676 m), weight 146 lb (66.225 kg), SpO2 97 %.  General: Patient does appear cachectic compared to previous exams. HEENT: Pupils equal, extraocular movements intact  Respiratory: Patient's speak in full sentences and does not appear short of breath  Cardiovascular: No lower extremity edema, non tender, no erythema  Skin: Warm dry intact with no signs of infection or rash on extremities or on axial skeleton.  Abdomen: Soft nontender  Neuro: Cranial nerves II through XII are intact, neurovascularly intact in all extremities with 2+ DTRs and 2+ pulses.  Lymph: No lymphadenopathy of posterior or anterior cervical chain or axillae bilaterally.  Gait normal with good balance and coordination.  MSK: Non tender with full range of motion and good stability and symmetric strength and tone of elbows, wrist, knee and ankles bilaterally. Significant osteoarthritic changes in multiple joints.   Knee: Bilateral On standing patient does have a valgus deformity of the left knee compared to the right knee.Still moderate swelling of the left knee.   Severe tenderness to palpation on the medial joint lines bilaterally left greater then right.  ROM now lacking last 5 degrees of flexion.  Ligaments with solid consistent endpoints including ACL, PCL, LCL, MCL.  painful patellar compression. Patellar glide with severe crepitus. Patellar and quadriceps tendons unremarkable. Hamstring and quadriceps strength is normal.  No change from previous exam  After informed written and verbal consent, patient was seated on exam table. Left knee was prepped with alcohol swab and utilizing anterolateral approach, patient's left knee space was injected with 4:0.5  marcaine 0.5%: Kenalog 40mg /dL. Patient tolerated the procedure well without immediate complications..   After informed  written and verbal consent, patient was seated on exam table. Right knee was prepped with alcohol swab and utilizing  anterolateral approach, patient's right knee space was injected with 4:0.5  marcaine 0.5%: Kenalog 40mg /dL. Patient tolerated the procedure well without immediate complications.    Impression and Recommendations:     This case required medical decision making of moderate complexity.

## 2015-02-25 NOTE — Telephone Encounter (Signed)
Iron 325 mg 3 times a week and pick up remeron from pharmacy.

## 2015-02-25 NOTE — Patient Instructions (Addendum)
Good to see you I want you to get iron 325 mg 3 times a week.  We will stop the allopurinol because of your kidneys See Dr. Jenny Reichmann soon as well. We need a blood smear with the atypical lymphocytes seen on your tests and need to check you kidneys again.  We injected knees but I cannot do again for at least 3 months and you can not talk me into it.  Start remeron 7.5 mg daily  Increase your ensure though to 3 times daily.  See me again in 4 weeks or we can discuss.

## 2015-02-25 NOTE — Assessment & Plan Note (Signed)
Injected knees again. Patient tolerated the procedure well with half the dose of steroid. Discussed with patient again the only curative measure would be knee replacement but would patient's other health problems she is likely not be a candidate at this time. Patient will continue the other conservative therapies including the icing home exercises and topical anti-inflammatory. We will stop allopurinol secondary to the bump in creatinine. Patient follow-up in 4 weeks.

## 2015-02-25 NOTE — Assessment & Plan Note (Signed)
Concerned with patients anemia. This could be secondary to either kidney failure with patient's bump on the creatinine versus potential malnutrition secondary to patient's oral intake. I also think that patient may have some other underlying cause. Patient had some atypical lymphocytes noted on her last CBC and we will recheck labs. Patient will be following up with primary care provider who can probably have more insight but me. We will continue to monitor.

## 2015-02-25 NOTE — Assessment & Plan Note (Signed)
Patient continues with a loss of weight due to decreased appetite. I believe the patient is having some difficulty and patient will likely have some underlying depression that is playing a role. Patient was put on a very low dose of Remeron to see we can increase this appetite. Patient is to follow-up with her primary care provider. Patient knows to call if any significant differences in mood occurs. Patient otherwise will follow-up in 3-4 weeks to discuss this medicine and to see if we should titrate up to 50 mg.

## 2015-02-27 ENCOUNTER — Ambulatory Visit (INDEPENDENT_AMBULATORY_CARE_PROVIDER_SITE_OTHER): Payer: Medicare Other | Admitting: Gastroenterology

## 2015-02-27 ENCOUNTER — Encounter: Payer: Self-pay | Admitting: Gastroenterology

## 2015-02-27 VITALS — BP 148/72 | HR 64 | Ht 66.0 in | Wt 146.0 lb

## 2015-02-27 DIAGNOSIS — R634 Abnormal weight loss: Secondary | ICD-10-CM

## 2015-02-27 NOTE — Patient Instructions (Signed)
Keep eating as much as you can (2-3 ensures per day, Klondike bars). Drink lots of water. Return to see Dr. Ardis Hughs as needed.

## 2015-02-27 NOTE — Progress Notes (Signed)
Review of pertinent gastrointestinal problems: 1. Weight loss, unexplained beginning 2015; 171pounds 07/2014;161 08/2014;158 09/2014;153 12/2014; 146 pounds 01/2015; h/o gastric cancer 2010; resected. EGD 07/2014 Ardis Hughs which showed a mildly erythematous Billroth II anastomosis. Gastric remnant biopsies compatible with reactive gastropathy, no H. Pylori.  CT scan chest 12/15 unrevealing.  CT scan abd/pelvis 12/2014 3.5 x 3.3 cm soft tissue mass involving the ascending colon highly suspicious for colon cancer. No adenopathy or metastatic liver disease.  Colonoscopy 12/2014 Ardis Hughs, found two small adenomas but no cancers in colon.    HPI: This is a  very pleasant 81 year old woman who continues to lose weight   She has lost 25 pounds since 07/2014; extensive workup above.  Her Creatinine has increased from 1.3 to 1.8 over the past 3-4 months.  Still drinking ensure; 2 per day.  Also tries to eat fruit and drink juice. She has no choking or coughing with the ensure. Tries to eat strawberries, Klondike bars (2 per day).      Past Medical History  Diagnosis Date  . ABDOMINAL PAIN, CHRONIC 04/07/2008  . ARTHRITIS 03/03/2008  . BRADYCARDIA, CHRONIC 12/17/2008  . CARDIAC MURMUR 02/01/2010  . CONSTIPATION, CHRONIC 10/15/2010  . DEGENERATIVE JOINT DISEASE, KNEES, BILATERAL 12/07/2007  . DEPRESSION 06/17/2009  . DIABETES MELLITUS, TYPE II 09/19/2007    DIET CONTROL   . DYSPHAGIA UNSPECIFIED 12/24/2009  . Gastroparesis 12/24/2009  . GENERALIZED OSTEOARTHROSIS INVOLVING HAND 10/03/2007  . GLAUCOMA 09/19/2007  . GOUTY ARTHROPATHY UNSPECIFIED 02/17/2009  . GOUT 09/19/2007  . HYPERLIPIDEMIA 01/27/2010  . HYPERTENSION 09/19/2007  . LUNG NODULE 03/03/2008  . MENOPAUSAL DISORDER 12/17/2008  . PERIPHERAL NEUROPATHY 06/19/2008  . PERSONAL HISTORY MALIGNANT NEOPLASM STOMACH 09/19/2007  . 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 STOMACH CANCER 03/03/2008  . UTI 12/15/2009  . Arthritis of knee, degenerative  10/19/2011    Past Surgical History  Procedure Laterality Date  . Vagotomy    . Partial gastrectomy    . Billroth ii gastorenterostomy    . Colonoscopy      Current Outpatient Prescriptions  Medication Sig Dispense Refill  . acetaminophen (TYLENOL) 500 MG tablet Take 1,000 mg by mouth every 6 (six) hours as needed for mild pain or moderate pain.    Marland Kitchen allopurinol (ZYLOPRIM) 100 MG tablet TAKE 1 TABLET BY MOUTH DAILY. 30 tablet 11  . febuxostat (ULORIC) 40 MG tablet Take 1 tablet (40 mg total) by mouth daily. 30 tablet 1  . GLIPIZIDE XL 2.5 MG 24 hr tablet TAKE 1 TABELT ONCE A DAY IF NEEDED 30 tablet 1  . glucose blood (FREESTYLE LITE) test strip Use as instructed 100 each 11  . ketoconazole (NIZORAL) 2 % cream Apply 1 application topically daily. 60 g 2  . latanoprost (XALATAN) 0.005 % ophthalmic solution Place 1 drop into both eyes at bedtime.     . Liniments (SALONPAS) PADS Apply 1 each topically daily as needed (knee pain).    Marland Kitchen losartan (COZAAR) 100 MG tablet TAKE 1 TABLET BY MOUTH ONCE DAILY. 90 tablet 3  . mirtazapine (REMERON) 15 MG tablet Take 0.5 tablets (7.5 mg total) by mouth at bedtime. 30 tablet 1  . omeprazole (PRILOSEC) 40 MG capsule Take 1 capsule (40 mg total) by mouth daily. 90 capsule 3  . OVER THE COUNTER MEDICATION Take 2 tablets by mouth daily. OTC vitamin    . oxyCODONE-acetaminophen (PERCOCET) 5-325 MG per tablet Take 1 tablet by mouth every 6 (six) hours as needed for severe pain. 6  tablet 0  . pravastatin (PRAVACHOL) 10 MG tablet Take 10 mg by mouth every evening.    . predniSONE (DELTASONE) 20 MG tablet 3 tabs po daily x 4 days 12 tablet 0  . tiZANidine (ZANAFLEX) 4 MG tablet Take 4 mg by mouth every 6 (six) hours as needed for muscle spasms.    . traMADol (ULTRAM) 50 MG tablet Take 50 mg by mouth every 6 (six) hours as needed for moderate pain.     No current facility-administered medications for this visit.    Allergies as of 02/27/2015 - Review Complete  02/27/2015  Allergen Reaction Noted  . Voltaren [diclofenac sodium] Other (See Comments) 08/15/2014  . Dulcolax [bisacodyl]    . Duloxetine hcl Other (See Comments) 12/22/2014  . Hydrocodone-acetaminophen    . Penicillins Swelling 06/19/2008  . Timolol  12/17/2008    Family History  Problem Relation Age of Onset  . Uterine cancer Mother   . Stroke Father     Hemorrhagic  . Cancer Sister     breast cancer and one sister with uterine cancer and one sister with ovarian cancerr  . Colon cancer Maternal Grandmother   . Colon cancer Paternal Grandfather   . Diabetes Other   . Stomach cancer Neg Hx   . Prostate cancer Brother     History   Social History  . Marital Status: Widowed    Spouse Name: N/A  . Number of Children: 6  . Years of Education: 8   Occupational History  . retired    Social History Main Topics  . Smoking status: Former Smoker    Types: Cigarettes  . Smokeless tobacco: Never Used     Comment: use to smoke a pack of cigaretts a day for 25 yrs. but stopped 10 yrs. ago.   . Alcohol Use: No  . Drug Use: No  . Sexual Activity: Not on file   Other Topics Concern  . Not on file   Social History Narrative   Lives with her daughter     Physical Exam: BP 148/72 mmHg  Pulse 64  Ht 5\' 6"  (1.676 m)  Wt 146 lb (66.225 kg)  BMI 23.58 kg/m2 Constitutional: generally well-appearing Psychiatric: alert and oriented x3 Abdomen: soft, nontender, nondistended, no obvious ascites, no peritoneal signs, normal bowel sounds   Assessment and plan: 81 y.o. female with persistent weight loss  Unclear why she is losing this weight. She has difficulty eating too much, says she feels a. I cannot point to any clear anatomic issues in her upper GI tract. I I recommended she continue to eat as best that she can. She is able to sit keep down sure, [employer], ice cream and I recommended she continue that.   Owens Loffler, MD [facility] 02/27/2015, 3:29 PM

## 2015-03-01 HISTORY — PX: HIP FRACTURE SURGERY: SHX118

## 2015-03-03 ENCOUNTER — Other Ambulatory Visit: Payer: Self-pay | Admitting: Internal Medicine

## 2015-03-03 NOTE — Telephone Encounter (Signed)
Faxed script back to [facility].../lmb 

## 2015-03-03 NOTE — Telephone Encounter (Signed)
Done Done hardcopy to [employer]

## 2015-03-05 ENCOUNTER — Encounter: Payer: Self-pay | Admitting: Internal Medicine

## 2015-03-05 ENCOUNTER — Ambulatory Visit (INDEPENDENT_AMBULATORY_CARE_PROVIDER_SITE_OTHER): Payer: Medicare Other | Admitting: Internal Medicine

## 2015-03-05 ENCOUNTER — Other Ambulatory Visit (INDEPENDENT_AMBULATORY_CARE_PROVIDER_SITE_OTHER): Payer: Medicare Other

## 2015-03-05 VITALS — BP 118/70 | HR 70 | Temp 98.0°F | Resp 18 | Ht 66.0 in | Wt 145.0 lb

## 2015-03-05 DIAGNOSIS — E119 Type 2 diabetes mellitus without complications: Secondary | ICD-10-CM | POA: Diagnosis not present

## 2015-03-05 DIAGNOSIS — N179 Acute kidney failure, unspecified: Secondary | ICD-10-CM

## 2015-03-05 DIAGNOSIS — E785 Hyperlipidemia, unspecified: Secondary | ICD-10-CM

## 2015-03-05 DIAGNOSIS — I10 Essential (primary) hypertension: Secondary | ICD-10-CM | POA: Diagnosis not present

## 2015-03-05 LAB — BASIC METABOLIC PANEL
BUN: 19 mg/dL (ref 6–23)
CO2: 23 meq/L (ref 19–32)
CREATININE: 1.32 mg/dL — AB (ref 0.40–1.20)
Calcium: 9.2 mg/dL (ref 8.4–10.5)
Chloride: 108 mEq/L (ref 96–112)
GFR: 49.04 mL/min — ABNORMAL LOW (ref >60.00)
Glucose, Bld: 109 mg/dL — ABNORMAL HIGH (ref 70–99)
Potassium: 4.3 mEq/L (ref 3.5–5.1)
SODIUM: 138 meq/L (ref 135–145)

## 2015-03-05 NOTE — Assessment & Plan Note (Signed)
?   Losartan related, has been off acei for 1 wk, for f/u bmp today, consider renal u/s and/or renal referral if persistently elevated

## 2015-03-05 NOTE — Assessment & Plan Note (Signed)
stable overall by history and exam, recent data reviewed with pt, and pt to continue medical treatment as before,  to f/u any worsening symptoms or concerns Lab Results  Component Value Date   HGBA1C 6.0 06/10/2014

## 2015-03-05 NOTE — Progress Notes (Signed)
Subjective:    Patient ID: Shari Mcmahon, female    DOB: January 20, 1927, 81 y.o.   MRN: [id number]  HPI  Here to f/u recent AKI on losartan now stopped x 1 wk; overall doing ok,  Pt denies chest pain, increasing sob or doe, wheezing, orthopnea, PND, increased LE swelling, palpitations, dizziness or syncope.  Pt denies new neurological symptoms such as new headache, or facial or extremity weakness or numbness.  Pt denies polydipsia, polyuria, or low sugar episode.   Pt denies new neurological symptoms such as new headache, or facial or extremity weakness or numbness.   Pt states overall good compliance with meds, mostly trying to follow appropriate diet, with wt overall stable,  but little exercise however. Denies worsening depressive symptoms, suicidal ideation, or panic  No new complaints Past Medical History  Diagnosis Date  . ABDOMINAL PAIN, CHRONIC 04/07/2008  . ARTHRITIS 03/03/2008  . BRADYCARDIA, CHRONIC 12/17/2008  . CARDIAC MURMUR 02/01/2010  . CONSTIPATION, CHRONIC 10/15/2010  . DEGENERATIVE JOINT DISEASE, KNEES, BILATERAL 12/07/2007  . DEPRESSION 06/17/2009  . DIABETES MELLITUS, TYPE II 09/19/2007    DIET CONTROL   . DYSPHAGIA UNSPECIFIED 12/24/2009  . Gastroparesis 12/24/2009  . GENERALIZED OSTEOARTHROSIS INVOLVING HAND 10/03/2007  . GLAUCOMA 09/19/2007  . GOUTY ARTHROPATHY UNSPECIFIED 02/17/2009  . GOUT 09/19/2007  . HYPERLIPIDEMIA 01/27/2010  . HYPERTENSION 09/19/2007  . LUNG NODULE 03/03/2008  . MENOPAUSAL DISORDER 12/17/2008  . PERIPHERAL NEUROPATHY 06/19/2008  . PERSONAL HISTORY MALIGNANT NEOPLASM STOMACH 09/19/2007  . 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 STOMACH CANCER 03/03/2008  . UTI 12/15/2009  . Arthritis of knee, degenerative 10/19/2011   Past Surgical History  Procedure Laterality Date  . Vagotomy    . Partial gastrectomy    . Billroth ii gastorenterostomy    . Colonoscopy      reports that she has quit smoking. Her smoking use included Cigarettes. She has  never used smokeless tobacco. She reports that she does not drink alcohol or use illicit drugs. family history includes Cancer in her sister; Colon cancer in her maternal grandmother and paternal grandfather; Diabetes in her other; Prostate cancer in her brother; Stroke in her father; Uterine cancer in her mother. There is no history of Stomach cancer. Allergies  Allergen Reactions  . Voltaren [Diclofenac Sodium] Other (See Comments)    Stomach irritation  . Dulcolax [Bisacodyl]     Unknown   . Duloxetine Hcl Other (See Comments)    Made patient not want to eat or drink  . Hydrocodone-Acetaminophen     REACTION: Rash  . Penicillins Swelling    Unknown   . Timolol     REACTION: bradycardia worse   Current Outpatient Prescriptions on File Prior to Visit  Medication Sig Dispense Refill  . acetaminophen (TYLENOL) 500 MG tablet Take 1,000 mg by mouth every 6 (six) hours as needed for mild pain or moderate pain.    Marland Kitchen allopurinol (ZYLOPRIM) 100 MG tablet TAKE 1 TABLET BY MOUTH DAILY. 30 tablet 11  . febuxostat (ULORIC) 40 MG tablet Take 1 tablet (40 mg total) by mouth daily. 30 tablet 1  . GLIPIZIDE XL 2.5 MG 24 hr tablet TAKE 1 TABELT ONCE A DAY IF NEEDED 30 tablet 1  . glucose blood (FREESTYLE LITE) test strip Use as instructed 100 each 11  . ketoconazole (NIZORAL) 2 % cream Apply 1 application topically daily. 60 g 2  . latanoprost (XALATAN) 0.005 % ophthalmic solution Place 1 drop into both eyes at  bedtime.     . Liniments (SALONPAS) PADS Apply 1 each topically daily as needed (knee pain).    Marland Kitchen losartan (COZAAR) 100 MG tablet TAKE 1 TABLET BY MOUTH ONCE DAILY. 90 tablet 3  . mirtazapine (REMERON) 15 MG tablet Take 0.5 tablets (7.5 mg total) by mouth at bedtime. 30 tablet 1  . omeprazole (PRILOSEC) 40 MG capsule Take 1 capsule (40 mg total) by mouth daily. 90 capsule 3  . OVER THE COUNTER MEDICATION Take 2 tablets by mouth daily. OTC vitamin    . oxyCODONE-acetaminophen (PERCOCET) 5-325 MG  per tablet Take 1 tablet by mouth every 6 (six) hours as needed for severe pain. 6 tablet 0  . pravastatin (PRAVACHOL) 10 MG tablet Take 10 mg by mouth every evening.    . predniSONE (DELTASONE) 20 MG tablet 3 tabs po daily x 4 days 12 tablet 0  . tiZANidine (ZANAFLEX) 4 MG tablet Take 4 mg by mouth every 6 (six) hours as needed for muscle spasms.    . traMADol (ULTRAM) 50 MG tablet TAKE 1 TABLET BY MOUTH EVERY 6 HOURS AS NEEDED. 120 tablet 2   No current facility-administered medications on file prior to visit.    Review of Systems  Constitutional: Negative for unusual diaphoresis or night sweats HENT: Negative for ringing in ear or discharge Eyes: Negative for double vision or worsening visual disturbance.  Respiratory: Negative for choking and stridor.   Gastrointestinal: Negative for vomiting or other signifcant bowel change Genitourinary: Negative for hematuria or change in urine volume.  Musculoskeletal: Negative for other MSK pain or swelling Skin: Negative for color change and worsening wound.  Neurological: Negative for tremors and numbness other than noted  Psychiatric/Behavioral: Negative for decreased concentration or agitation other than above       Objective:   Physical Exam BP 118/70 mmHg  Pulse 70  Temp(Src) 98 F (36.7 C) (Oral)  Resp 18  Ht 5\' 6"  (1.676 m)  Wt 145 lb (65.772 kg)  BMI 23.41 kg/m2  SpO2 95% VS noted,  Constitutional: Pt appears in no significant distress HENT: Head: NCAT.  Right Ear: External ear normal.  Left Ear: External ear normal.  Eyes: . Pupils are equal, round, and reactive to light. Conjunctivae and EOM are normal Neck: Normal range of motion. Neck supple.  Cardiovascular: Normal rate and regular rhythm.   Pulmonary/Chest: Effort normal and breath sounds without rales or wheezing.  Abd:  Soft, NT, ND, + BS Neurological: Pt is alert. Not confused , motor grossly intact Skin: Skin is warm. No rash, no LE edema Psychiatric: Pt  behavior is normal. No agitation.      Assessment & Plan:

## 2015-03-05 NOTE — Patient Instructions (Signed)
OK to stay off the losartan, and no new medications are needed today  Please continue all other medications as before  Please have the pharmacy call with any other refills you may need.  Please continue your efforts at being more active, low cholesterol diet, and weight control.  Please keep your appointments with your specialists as you may have planned

## 2015-03-05 NOTE — Assessment & Plan Note (Signed)
stable overall by history and exam, recent data reviewed with pt, and pt to continue medical treatment as before - no new med to replace the losartan for now,  to f/u any worsening symptoms or concerns

## 2015-03-05 NOTE — Assessment & Plan Note (Signed)
stable overall by history and exam, recent data reviewed with pt, and pt to continue medical treatment as before,  to f/u any worsening symptoms or concerns Lab Results  Component Value Date   LDLCALC 76 06/10/2014

## 2015-03-06 ENCOUNTER — Telehealth: Payer: Self-pay | Admitting: *Deleted

## 2015-03-06 ENCOUNTER — Encounter: Payer: Medicare Other | Admitting: Podiatry

## 2015-03-06 NOTE — Telephone Encounter (Signed)
Received call from case manager and patient. Shari Mcmahon was needing pt last BP reading and A1C. Pt states its ok to call give case manager the results. Called Shari Mcmahon back no answer LMOM last A1c done 06/10/2014 it was 6.0, and BP was check 03/05/15 it was 118/70...Shari Mcmahon

## 2015-03-12 ENCOUNTER — Telehealth: Payer: Self-pay | Admitting: Gastroenterology

## 2015-03-13 NOTE — Telephone Encounter (Signed)
I agree, thanks!

## 2015-03-13 NOTE — Telephone Encounter (Signed)
The pt has been drinking Ensure and eating Klondike bars and recently noticed black stools.  No other symptoms that are not normal for her.  She states today the stools are better.  I advised her to watch for bleeding, and worsening stools.  She will call back if the stools turn black again.  She was advised it may be food related.  Forwarded for review

## 2015-03-17 ENCOUNTER — Other Ambulatory Visit (INDEPENDENT_AMBULATORY_CARE_PROVIDER_SITE_OTHER): Payer: Medicare Other

## 2015-03-17 ENCOUNTER — Telehealth: Payer: Self-pay | Admitting: Gastroenterology

## 2015-03-17 DIAGNOSIS — K921 Melena: Secondary | ICD-10-CM

## 2015-03-17 LAB — CBC WITH DIFFERENTIAL/PLATELET
Eosinophils Relative: 1 % (ref 0.0–5.0)
HCT: 32.6 % — ABNORMAL LOW (ref 36.0–46.0)
Hemoglobin: 10.8 g/dL — ABNORMAL LOW (ref 12.0–15.0)
Lymphocytes Relative: 17 % (ref 12.0–46.0)
MCHC: 33.2 g/dL (ref 30.0–36.0)
MCV: 103 fl — AB (ref 78.0–100.0)
Monocytes Relative: 7 % (ref 3.0–12.0)
NEUTROS PCT: 75 % (ref 43.0–77.0)
PLATELETS: 175 10*3/uL (ref 150.0–400.0)
RBC: 3.17 Mil/uL — ABNORMAL LOW (ref 3.87–5.11)
RDW: 17.5 % — ABNORMAL HIGH (ref 11.5–15.5)
WBC: 7.4 10*3/uL (ref 4.0–10.5)

## 2015-03-17 NOTE — Telephone Encounter (Signed)
Cbc today.  Thanks

## 2015-03-17 NOTE — Telephone Encounter (Signed)
Pt aware and will come in today for labs

## 2015-03-17 NOTE — Telephone Encounter (Signed)
Dr Ardis Hughs do you want to see the pt, or have her get labs?

## 2015-03-27 ENCOUNTER — Ambulatory Visit: Payer: Medicare Other | Admitting: Family Medicine

## 2015-03-29 DIAGNOSIS — E785 Hyperlipidemia, unspecified: Secondary | ICD-10-CM | POA: Diagnosis not present

## 2015-03-29 DIAGNOSIS — D62 Acute posthemorrhagic anemia: Secondary | ICD-10-CM | POA: Diagnosis not present

## 2015-03-29 DIAGNOSIS — E874 Mixed disorder of acid-base balance: Secondary | ICD-10-CM | POA: Diagnosis not present

## 2015-03-29 DIAGNOSIS — W010XXA Fall on same level from slipping, tripping and stumbling without subsequent striking against object, initial encounter: Secondary | ICD-10-CM | POA: Diagnosis not present

## 2015-03-29 DIAGNOSIS — I10 Essential (primary) hypertension: Secondary | ICD-10-CM | POA: Diagnosis not present

## 2015-03-29 DIAGNOSIS — I712 Thoracic aortic aneurysm, without rupture: Secondary | ICD-10-CM | POA: Diagnosis not present

## 2015-03-29 DIAGNOSIS — S72011A Unspecified intracapsular fracture of right femur, initial encounter for closed fracture: Secondary | ICD-10-CM | POA: Diagnosis not present

## 2015-03-29 DIAGNOSIS — R52 Pain, unspecified: Secondary | ICD-10-CM | POA: Diagnosis not present

## 2015-03-29 DIAGNOSIS — G309 Alzheimer's disease, unspecified: Secondary | ICD-10-CM | POA: Diagnosis not present

## 2015-03-29 DIAGNOSIS — Z96642 Presence of left artificial hip joint: Secondary | ICD-10-CM | POA: Diagnosis not present

## 2015-03-29 DIAGNOSIS — J9602 Acute respiratory failure with hypercapnia: Secondary | ICD-10-CM | POA: Diagnosis not present

## 2015-03-29 DIAGNOSIS — I129 Hypertensive chronic kidney disease with stage 1 through stage 4 chronic kidney disease, or unspecified chronic kidney disease: Secondary | ICD-10-CM | POA: Diagnosis not present

## 2015-03-29 DIAGNOSIS — S72041A Displaced fracture of base of neck of right femur, initial encounter for closed fracture: Secondary | ICD-10-CM | POA: Diagnosis not present

## 2015-03-29 DIAGNOSIS — N189 Chronic kidney disease, unspecified: Secondary | ICD-10-CM | POA: Diagnosis not present

## 2015-03-29 DIAGNOSIS — Z9181 History of falling: Secondary | ICD-10-CM | POA: Diagnosis not present

## 2015-03-29 DIAGNOSIS — J439 Emphysema, unspecified: Secondary | ICD-10-CM | POA: Diagnosis not present

## 2015-03-29 DIAGNOSIS — M79661 Pain in right lower leg: Secondary | ICD-10-CM | POA: Diagnosis not present

## 2015-03-29 DIAGNOSIS — D72829 Elevated white blood cell count, unspecified: Secondary | ICD-10-CM | POA: Diagnosis not present

## 2015-03-29 DIAGNOSIS — I714 Abdominal aortic aneurysm, without rupture: Secondary | ICD-10-CM | POA: Diagnosis not present

## 2015-03-29 DIAGNOSIS — Z09 Encounter for follow-up examination after completed treatment for conditions other than malignant neoplasm: Secondary | ICD-10-CM | POA: Diagnosis not present

## 2015-03-29 DIAGNOSIS — R279 Unspecified lack of coordination: Secondary | ICD-10-CM | POA: Diagnosis not present

## 2015-03-29 DIAGNOSIS — K219 Gastro-esophageal reflux disease without esophagitis: Secondary | ICD-10-CM | POA: Diagnosis not present

## 2015-03-29 DIAGNOSIS — Z85028 Personal history of other malignant neoplasm of stomach: Secondary | ICD-10-CM | POA: Diagnosis not present

## 2015-03-29 DIAGNOSIS — R1310 Dysphagia, unspecified: Secondary | ICD-10-CM | POA: Diagnosis not present

## 2015-03-29 DIAGNOSIS — M25559 Pain in unspecified hip: Secondary | ICD-10-CM | POA: Diagnosis not present

## 2015-03-29 DIAGNOSIS — Z903 Acquired absence of stomach [part of]: Secondary | ICD-10-CM | POA: Diagnosis not present

## 2015-03-29 DIAGNOSIS — F05 Delirium due to known physiological condition: Secondary | ICD-10-CM | POA: Diagnosis not present

## 2015-03-29 DIAGNOSIS — E119 Type 2 diabetes mellitus without complications: Secondary | ICD-10-CM | POA: Diagnosis not present

## 2015-03-29 DIAGNOSIS — Z880 Allergy status to penicillin: Secondary | ICD-10-CM | POA: Diagnosis not present

## 2015-03-29 DIAGNOSIS — S72001A Fracture of unspecified part of neck of right femur, initial encounter for closed fracture: Secondary | ICD-10-CM | POA: Diagnosis not present

## 2015-03-29 DIAGNOSIS — M25561 Pain in right knee: Secondary | ICD-10-CM | POA: Diagnosis not present

## 2015-03-29 DIAGNOSIS — M109 Gout, unspecified: Secondary | ICD-10-CM | POA: Diagnosis not present

## 2015-03-29 DIAGNOSIS — R6889 Other general symptoms and signs: Secondary | ICD-10-CM | POA: Diagnosis not present

## 2015-03-29 DIAGNOSIS — S72001D Fracture of unspecified part of neck of right femur, subsequent encounter for closed fracture with routine healing: Secondary | ICD-10-CM | POA: Diagnosis not present

## 2015-03-29 DIAGNOSIS — F0390 Unspecified dementia without behavioral disturbance: Secondary | ICD-10-CM | POA: Diagnosis not present

## 2015-03-29 DIAGNOSIS — J432 Centrilobular emphysema: Secondary | ICD-10-CM | POA: Diagnosis not present

## 2015-03-29 DIAGNOSIS — E872 Acidosis: Secondary | ICD-10-CM | POA: Diagnosis not present

## 2015-03-29 DIAGNOSIS — T40601A Poisoning by unspecified narcotics, accidental (unintentional), initial encounter: Secondary | ICD-10-CM | POA: Diagnosis not present

## 2015-03-29 DIAGNOSIS — Z87891 Personal history of nicotine dependence: Secondary | ICD-10-CM | POA: Diagnosis not present

## 2015-03-29 DIAGNOSIS — E1122 Type 2 diabetes mellitus with diabetic chronic kidney disease: Secondary | ICD-10-CM | POA: Diagnosis not present

## 2015-03-29 DIAGNOSIS — R918 Other nonspecific abnormal finding of lung field: Secondary | ICD-10-CM | POA: Diagnosis not present

## 2015-03-29 DIAGNOSIS — M25562 Pain in left knee: Secondary | ICD-10-CM | POA: Diagnosis not present

## 2015-04-03 DIAGNOSIS — L89611 Pressure ulcer of right heel, stage 1: Secondary | ICD-10-CM | POA: Diagnosis not present

## 2015-04-03 DIAGNOSIS — D72829 Elevated white blood cell count, unspecified: Secondary | ICD-10-CM | POA: Diagnosis not present

## 2015-04-03 DIAGNOSIS — F0390 Unspecified dementia without behavioral disturbance: Secondary | ICD-10-CM | POA: Diagnosis not present

## 2015-04-03 DIAGNOSIS — M25562 Pain in left knee: Secondary | ICD-10-CM | POA: Diagnosis not present

## 2015-04-03 DIAGNOSIS — S728X1A Other fracture of right femur, initial encounter for closed fracture: Secondary | ICD-10-CM | POA: Diagnosis not present

## 2015-04-03 DIAGNOSIS — E785 Hyperlipidemia, unspecified: Secondary | ICD-10-CM | POA: Diagnosis not present

## 2015-04-03 DIAGNOSIS — M25561 Pain in right knee: Secondary | ICD-10-CM | POA: Diagnosis not present

## 2015-04-03 DIAGNOSIS — Z9181 History of falling: Secondary | ICD-10-CM | POA: Diagnosis not present

## 2015-04-03 DIAGNOSIS — S72001D Fracture of unspecified part of neck of right femur, subsequent encounter for closed fracture with routine healing: Secondary | ICD-10-CM | POA: Diagnosis not present

## 2015-04-03 DIAGNOSIS — R279 Unspecified lack of coordination: Secondary | ICD-10-CM | POA: Diagnosis not present

## 2015-04-03 DIAGNOSIS — M25551 Pain in right hip: Secondary | ICD-10-CM | POA: Diagnosis not present

## 2015-04-03 DIAGNOSIS — R600 Localized edema: Secondary | ICD-10-CM | POA: Diagnosis not present

## 2015-04-03 DIAGNOSIS — E440 Moderate protein-calorie malnutrition: Secondary | ICD-10-CM | POA: Diagnosis not present

## 2015-04-03 DIAGNOSIS — M109 Gout, unspecified: Secondary | ICD-10-CM | POA: Diagnosis not present

## 2015-04-03 DIAGNOSIS — K219 Gastro-esophageal reflux disease without esophagitis: Secondary | ICD-10-CM | POA: Diagnosis not present

## 2015-04-03 DIAGNOSIS — E119 Type 2 diabetes mellitus without complications: Secondary | ICD-10-CM | POA: Diagnosis not present

## 2015-04-03 DIAGNOSIS — I10 Essential (primary) hypertension: Secondary | ICD-10-CM | POA: Diagnosis not present

## 2015-04-03 DIAGNOSIS — D62 Acute posthemorrhagic anemia: Secondary | ICD-10-CM | POA: Diagnosis not present

## 2015-04-03 DIAGNOSIS — M1009 Idiopathic gout, multiple sites: Secondary | ICD-10-CM | POA: Diagnosis not present

## 2015-04-03 DIAGNOSIS — G309 Alzheimer's disease, unspecified: Secondary | ICD-10-CM | POA: Diagnosis not present

## 2015-04-03 DIAGNOSIS — R6889 Other general symptoms and signs: Secondary | ICD-10-CM | POA: Diagnosis not present

## 2015-04-05 DIAGNOSIS — G309 Alzheimer's disease, unspecified: Secondary | ICD-10-CM | POA: Diagnosis not present

## 2015-04-05 DIAGNOSIS — E440 Moderate protein-calorie malnutrition: Secondary | ICD-10-CM | POA: Diagnosis not present

## 2015-04-05 DIAGNOSIS — S728X1A Other fracture of right femur, initial encounter for closed fracture: Secondary | ICD-10-CM | POA: Diagnosis not present

## 2015-04-05 DIAGNOSIS — E119 Type 2 diabetes mellitus without complications: Secondary | ICD-10-CM | POA: Diagnosis not present

## 2015-04-05 DIAGNOSIS — R600 Localized edema: Secondary | ICD-10-CM | POA: Diagnosis not present

## 2015-04-07 ENCOUNTER — Ambulatory Visit: Payer: Medicare Other | Admitting: Family Medicine

## 2015-04-15 ENCOUNTER — Ambulatory Visit: Payer: Medicare Other | Admitting: Internal Medicine

## 2015-04-16 DIAGNOSIS — M1009 Idiopathic gout, multiple sites: Secondary | ICD-10-CM | POA: Diagnosis not present

## 2015-04-16 DIAGNOSIS — M25561 Pain in right knee: Secondary | ICD-10-CM | POA: Diagnosis not present

## 2015-04-16 DIAGNOSIS — M25551 Pain in right hip: Secondary | ICD-10-CM | POA: Diagnosis not present

## 2015-04-16 DIAGNOSIS — M25562 Pain in left knee: Secondary | ICD-10-CM | POA: Diagnosis not present

## 2015-04-21 DIAGNOSIS — E119 Type 2 diabetes mellitus without complications: Secondary | ICD-10-CM | POA: Diagnosis not present

## 2015-04-21 DIAGNOSIS — S728X1A Other fracture of right femur, initial encounter for closed fracture: Secondary | ICD-10-CM | POA: Diagnosis not present

## 2015-04-21 DIAGNOSIS — M25561 Pain in right knee: Secondary | ICD-10-CM | POA: Diagnosis not present

## 2015-04-21 DIAGNOSIS — M25562 Pain in left knee: Secondary | ICD-10-CM | POA: Diagnosis not present

## 2015-04-24 ENCOUNTER — Telehealth: Payer: Self-pay | Admitting: Family Medicine

## 2015-04-24 NOTE — Telephone Encounter (Signed)
Would like to know when patient had last Cortizone injection.

## 2015-04-27 ENCOUNTER — Other Ambulatory Visit: Payer: Self-pay

## 2015-04-27 NOTE — Telephone Encounter (Signed)
lmovm for pt to return call.  

## 2015-04-29 DIAGNOSIS — L89611 Pressure ulcer of right heel, stage 1: Secondary | ICD-10-CM | POA: Diagnosis not present

## 2015-04-30 NOTE — Telephone Encounter (Signed)
Son has called back.  He can be reached at [phone]

## 2015-04-30 NOTE — Telephone Encounter (Signed)
lmovm for pt to return call.  

## 2015-05-06 DIAGNOSIS — L89611 Pressure ulcer of right heel, stage 1: Secondary | ICD-10-CM | POA: Diagnosis not present

## 2015-05-08 ENCOUNTER — Emergency Department (HOSPITAL_COMMUNITY)
Admission: EM | Admit: 2015-05-08 | Discharge: 2015-05-08 | Disposition: A | Discharge status: Home / Self Care | Payer: Medicare Other | Attending: Emergency Medicine | Admitting: Emergency Medicine

## 2015-05-08 ENCOUNTER — Emergency Department (HOSPITAL_COMMUNITY): Payer: Medicare Other

## 2015-05-08 ENCOUNTER — Encounter (HOSPITAL_COMMUNITY): Payer: Self-pay | Admitting: *Deleted

## 2015-05-08 DIAGNOSIS — K5900 Constipation, unspecified: Secondary | ICD-10-CM | POA: Insufficient documentation

## 2015-05-08 DIAGNOSIS — Z87440 Personal history of urinary (tract) infections: Secondary | ICD-10-CM | POA: Diagnosis not present

## 2015-05-08 DIAGNOSIS — Z8742 Personal history of other diseases of the female genital tract: Secondary | ICD-10-CM | POA: Diagnosis not present

## 2015-05-08 DIAGNOSIS — E119 Type 2 diabetes mellitus without complications: Secondary | ICD-10-CM | POA: Diagnosis not present

## 2015-05-08 DIAGNOSIS — Z8709 Personal history of other diseases of the respiratory system: Secondary | ICD-10-CM | POA: Diagnosis not present

## 2015-05-08 DIAGNOSIS — R011 Cardiac murmur, unspecified: Secondary | ICD-10-CM | POA: Diagnosis not present

## 2015-05-08 DIAGNOSIS — M2550 Pain in unspecified joint: Secondary | ICD-10-CM

## 2015-05-08 DIAGNOSIS — M25551 Pain in right hip: Secondary | ICD-10-CM | POA: Insufficient documentation

## 2015-05-08 DIAGNOSIS — Z85028 Personal history of other malignant neoplasm of stomach: Secondary | ICD-10-CM | POA: Insufficient documentation

## 2015-05-08 DIAGNOSIS — M25559 Pain in unspecified hip: Secondary | ICD-10-CM | POA: Diagnosis not present

## 2015-05-08 DIAGNOSIS — G8929 Other chronic pain: Secondary | ICD-10-CM | POA: Insufficient documentation

## 2015-05-08 DIAGNOSIS — M179 Osteoarthritis of knee, unspecified: Secondary | ICD-10-CM | POA: Diagnosis not present

## 2015-05-08 DIAGNOSIS — M109 Gout, unspecified: Secondary | ICD-10-CM | POA: Insufficient documentation

## 2015-05-08 DIAGNOSIS — I959 Hypotension, unspecified: Secondary | ICD-10-CM | POA: Diagnosis not present

## 2015-05-08 DIAGNOSIS — Z8781 Personal history of (healed) traumatic fracture: Secondary | ICD-10-CM | POA: Insufficient documentation

## 2015-05-08 DIAGNOSIS — Z9889 Other specified postprocedural states: Secondary | ICD-10-CM | POA: Diagnosis not present

## 2015-05-08 DIAGNOSIS — M25552 Pain in left hip: Secondary | ICD-10-CM | POA: Diagnosis not present

## 2015-05-08 DIAGNOSIS — Z96641 Presence of right artificial hip joint: Secondary | ICD-10-CM | POA: Diagnosis not present

## 2015-05-08 DIAGNOSIS — I10 Essential (primary) hypertension: Secondary | ICD-10-CM | POA: Insufficient documentation

## 2015-05-08 DIAGNOSIS — M8588 Other specified disorders of bone density and structure, other site: Secondary | ICD-10-CM | POA: Diagnosis not present

## 2015-05-08 DIAGNOSIS — Z880 Allergy status to penicillin: Secondary | ICD-10-CM | POA: Diagnosis not present

## 2015-05-08 DIAGNOSIS — E785 Hyperlipidemia, unspecified: Secondary | ICD-10-CM | POA: Diagnosis not present

## 2015-05-08 DIAGNOSIS — Z87891 Personal history of nicotine dependence: Secondary | ICD-10-CM | POA: Diagnosis not present

## 2015-05-08 DIAGNOSIS — K5909 Other constipation: Secondary | ICD-10-CM | POA: Diagnosis not present

## 2015-05-08 DIAGNOSIS — F329 Major depressive disorder, single episode, unspecified: Secondary | ICD-10-CM | POA: Diagnosis not present

## 2015-05-08 DIAGNOSIS — M47816 Spondylosis without myelopathy or radiculopathy, lumbar region: Secondary | ICD-10-CM | POA: Diagnosis not present

## 2015-05-08 LAB — CBC WITH DIFFERENTIAL/PLATELET
BASOS ABS: 0 10*3/uL (ref 0.0–0.1)
Basophils Relative: 0 % (ref 0–1)
EOS PCT: 1 % (ref 0–5)
Eosinophils Absolute: 0.1 10*3/uL (ref 0.0–0.7)
HEMATOCRIT: 30.8 % — AB (ref 36.0–46.0)
Hemoglobin: 9.8 g/dL — ABNORMAL LOW (ref 12.0–15.0)
LYMPHS ABS: 1.9 10*3/uL (ref 0.7–4.0)
Lymphocytes Relative: 13 % (ref 12–46)
MCH: 31.2 pg (ref 26.0–34.0)
MCHC: 31.8 g/dL (ref 30.0–36.0)
MCV: 98.1 fL (ref 78.0–100.0)
Monocytes Absolute: 2 10*3/uL — ABNORMAL HIGH (ref 0.1–1.0)
Monocytes Relative: 14 % — ABNORMAL HIGH (ref 3–12)
Neutro Abs: 10.4 10*3/uL — ABNORMAL HIGH (ref 1.7–7.7)
Neutrophils Relative %: 72 % (ref 43–77)
Platelets: 447 10*3/uL — ABNORMAL HIGH (ref 150–400)
RBC: 3.14 MIL/uL — ABNORMAL LOW (ref 3.87–5.11)
RDW: 16.2 % — ABNORMAL HIGH (ref 11.5–15.5)
WBC: 14.4 10*3/uL — ABNORMAL HIGH (ref 4.0–10.5)

## 2015-05-08 LAB — BASIC METABOLIC PANEL
Anion gap: 8 (ref 5–15)
BUN: 14 mg/dL (ref 6–20)
CHLORIDE: 101 mmol/L (ref 101–111)
CO2: 30 mmol/L (ref 22–32)
Calcium: 8.7 mg/dL — ABNORMAL LOW (ref 8.9–10.3)
Creatinine, Ser: 0.98 mg/dL (ref 0.44–1.00)
GFR calc Af Amer: 59 mL/min — ABNORMAL LOW (ref >60)
GFR calc non Af Amer: 51 mL/min — ABNORMAL LOW (ref >60)
Glucose, Bld: 100 mg/dL — ABNORMAL HIGH (ref 65–99)
Potassium: 4.7 mmol/L (ref 3.5–5.1)
SODIUM: 139 mmol/L (ref 135–145)

## 2015-05-08 MED ORDER — HYDROMORPHONE HCL 1 MG/ML IJ SOLN
0.5000 mg | Freq: Once | INTRAMUSCULAR | Status: AC
  Administered 2015-05-08: 0.5 mg via INTRAVENOUS
  Filled 2015-05-08: qty 1

## 2015-05-08 MED ORDER — OXYCODONE-ACETAMINOPHEN 5-325 MG PO TABS: 1.0000 | ORAL_TABLET | ORAL | Status: DC | PRN

## 2015-05-08 MED ORDER — ONDANSETRON HCL 4 MG/2ML IJ SOLN
4.0000 mg | Freq: Once | INTRAMUSCULAR | Status: AC
  Administered 2015-05-08: 4 mg via INTRAVENOUS
  Filled 2015-05-08: qty 2

## 2015-05-08 NOTE — Care Management Note (Addendum)
Case Management Note  Patient Details  Name: Shari Mcmahon MRN: [id number] Date of Birth: 1927/04/21  Subjective/Objective:              44 female [employer] d/c from [place] on Wednesday May 06 2015 to home with family after a rehab stay PMH hip surgery Pt has had hospital bed delivered to home already per son, Pt unable to offer answers to questions.  Daughter states pt had DSS RN & Aide to visit in home on Thursday May 07 2015 who "looked at her hip and thought it was infected on the inside" Reports pt has been having these services for a period of time. Pt also noted to have bedsores after left clapps and daughter stating dressing need changing Pt is active with [facility] for services for [facility], PT, OT, ST and aide to start services per [name] at [place] [phone] on 05/09/15    Action/Plan: spoke with son and daughter and pt Spoke with Dr Betsey Holiday and ED SW [id number] updated EDP on pt services available to her    Expected Discharge Date:    05/08/15             Expected Discharge Plan:    home with home health and PCS via DSS  In-House Referral:   ED SW   Discharge planning Services   home with home health and PCS via [place] Choice:   home with home health and pcs via DSS Choice offered to:   pt daughter, son, pt  DME Arranged:    hospital bed DME Agency:    [place] Go  [facility] Arranged:   [place], PT/OT/Aide, [place] Agency:   confirmed [facility] start date is May 09 2015 per clapps  Status of Service:   completed    Additional Comments:  Robbie Lis, RN 05/08/2015, 3:46 PM

## 2015-05-08 NOTE — Discharge Instructions (Signed)

## 2015-05-08 NOTE — ED Provider Notes (Signed)
CSN: [id number]     Arrival date & time 05/08/15  1509 History   First MD Initiated Contact with Patient 05/08/15 1515     Chief Complaint  Patient presents with  . Hip Pain     (Consider location/radiation/quality/duration/timing/severity/associated sxs/prior Treatment) HPI Comments: Patient presents to the emergency room for further evaluation of bilateral hip pain. Patient had a fall 1 month ago and fractured her right hip. She had surgical repair and was in rehabilitation until yesterday. Patient reports that she has had severe pain in both hips, right greater than left throughout the entire stay in rehabilitation. She has been taking pain medication without improvement.  Patient is a 81 y.o. female presenting with hip pain.  Hip Pain    Past Medical History  Diagnosis Date  . ABDOMINAL PAIN, CHRONIC 04/07/2008  . ARTHRITIS 03/03/2008  . BRADYCARDIA, CHRONIC 12/17/2008  . CARDIAC MURMUR 02/01/2010  . CONSTIPATION, CHRONIC 10/15/2010  . DEGENERATIVE JOINT DISEASE, KNEES, BILATERAL 12/07/2007  . DEPRESSION 06/17/2009  . DIABETES MELLITUS, TYPE II 09/19/2007    DIET CONTROL   . DYSPHAGIA UNSPECIFIED 12/24/2009  . Gastroparesis 12/24/2009  . GENERALIZED OSTEOARTHROSIS INVOLVING HAND 10/03/2007  . GLAUCOMA 09/19/2007  . GOUTY ARTHROPATHY UNSPECIFIED 02/17/2009  . GOUT 09/19/2007  . HYPERLIPIDEMIA 01/27/2010  . HYPERTENSION 09/19/2007  . LUNG NODULE 03/03/2008  . MENOPAUSAL DISORDER 12/17/2008  . PERIPHERAL NEUROPATHY 06/19/2008  . PERSONAL HISTORY MALIGNANT NEOPLASM STOMACH 09/19/2007  . 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 STOMACH CANCER 03/03/2008  . UTI 12/15/2009  . Arthritis of knee, degenerative 10/19/2011   Past Surgical History  Procedure Laterality Date  . Vagotomy    . Partial gastrectomy    . Billroth ii gastorenterostomy    . Colonoscopy     Family History  Problem Relation Age of Onset  . Uterine cancer Mother   . Stroke Father     Hemorrhagic  . Cancer  Sister     breast cancer and one sister with uterine cancer and one sister with ovarian cancerr  . Colon cancer Maternal Grandmother   . Colon cancer Paternal Grandfather   . Diabetes Other   . Stomach cancer Neg Hx   . Prostate cancer Brother    History  Substance Use Topics  . Smoking status: Former Smoker    Types: Cigarettes  . Smokeless tobacco: Never Used     Comment: use to smoke a pack of cigaretts a day for 25 yrs. but stopped 10 yrs. ago.   . Alcohol Use: No   OB History    No data available     Review of Systems  Musculoskeletal: Positive for arthralgias.  All other systems reviewed and are negative.     Allergies  Voltaren; Dulcolax; Duloxetine hcl; Penicillins; and Timolol  Home Medications   Prior to Admission medications   Medication Sig Start Date End Date Taking? Authorizing Provider  acetaminophen (TYLENOL) 500 MG tablet Take 500 mg by mouth every 6 (six) hours as needed for mild pain or moderate pain.    Yes Historical Provider, MD  divalproex (DEPAKOTE SPRINKLE) 125 MG capsule Take 125 mg by mouth.   Yes Historical Provider, MD  febuxostat (ULORIC) 40 MG tablet Take 1 tablet (40 mg total) by mouth daily. 10/17/14  Yes Lyndal Pulley, DO  glucose blood (FREESTYLE LITE) test strip Use as instructed 04/29/14  Yes Biagio Borg, MD  HYDROcodone-acetaminophen (NORCO/VICODIN) 5-325 MG per tablet Take 1-2 tablets by mouth every 6 (six)  hours as needed for moderate pain or severe pain (Dose dependent on severity of pain).   Yes Historical Provider, MD  latanoprost (XALATAN) 0.005 % ophthalmic solution Place 1 drop into both eyes at bedtime.  07/28/14  Yes Historical Provider, MD  Magnesium Hydroxide (MILK OF MAGNESIA PO) Take 15 mLs by mouth daily.   Yes Historical Provider, MD  mirtazapine (REMERON) 7.5 MG tablet Take 7.5 mg by mouth at bedtime.   Yes Historical Provider, MD  Multiple Vitamins-Minerals (MULTIVITAMIN ADULT PO) Take 1 tablet by mouth daily.   Yes  Historical Provider, MD  omeprazole (PRILOSEC) 40 MG capsule Take 1 capsule (40 mg total) by mouth daily. 08/15/14  Yes Milus Banister, MD  OVER THE COUNTER MEDICATION Take 8 oz by mouth 3 (three) times daily. MedPass supplement   Yes Historical Provider, MD  Pollen Extracts (PROSTAT PO) Take 30 mLs by mouth 2 (two) times daily.   Yes Historical Provider, MD  polyethylene glycol (MIRALAX / GLYCOLAX) packet Take 17 g by mouth daily. Mix 17 grams in 8 oz juice/water and drink by mouth once daily   Yes Historical Provider, MD  pravastatin (PRAVACHOL) 10 MG tablet Take 10 mg by mouth at bedtime.    Yes Historical Provider, MD  PRESCRIPTION MEDICATION Skin prep. Apply to blisters on right and left heels twice daily   Yes Historical Provider, MD  senna (SENOKOT) 8.6 MG tablet Take 1 tablet by mouth 2 (two) times daily.   Yes Historical Provider, MD  ZINC OXIDE, TOPICAL, 10 % CREA Apply 1 application topically 3 (three) times daily as needed (Apply topically to sacrum and buttocks 3 times daily as needed).   Yes Historical Provider, MD  allopurinol (ZYLOPRIM) 100 MG tablet TAKE 1 TABLET BY MOUTH DAILY. Patient not taking: Reported on 05/08/2015 10/20/14   Biagio Borg, MD  GLIPIZIDE XL 2.5 MG 24 hr tablet TAKE 1 TABELT ONCE A DAY IF NEEDED Patient not taking: Reported on 05/08/2015 09/05/14   Biagio Borg, MD  ketoconazole (NIZORAL) 2 % cream Apply 1 application topically daily. Patient not taking: Reported on 05/08/2015 12/05/14   Trula Slade, DPM  losartan (COZAAR) 100 MG tablet TAKE 1 TABLET BY MOUTH ONCE DAILY. Patient not taking: Reported on 05/08/2015 02/23/15   Biagio Borg, MD  mirtazapine (REMERON) 15 MG tablet Take 0.5 tablets (7.5 mg total) by mouth at bedtime. Patient not taking: Reported on 05/08/2015 02/25/15   Lyndal Pulley, DO  oxyCODONE-acetaminophen (PERCOCET) 5-325 MG per tablet Take 1 tablet by mouth every 6 (six) hours as needed for severe pain. Patient not taking: Reported on 05/08/2015  10/28/14   Mercedes Camprubi-Soms, PA-C  predniSONE (DELTASONE) 20 MG tablet 3 tabs po daily x 4 days Patient not taking: Reported on 05/08/2015 01/13/15   Biagio Borg, MD  traMADol (ULTRAM) 50 MG tablet TAKE 1 TABLET BY MOUTH EVERY 6 HOURS AS NEEDED. Patient not taking: Reported on 05/08/2015 03/03/15   Biagio Borg, MD   BP 155/82 mmHg  Pulse 78  Temp(Src) 98.2 F (36.8 C) (Oral)  Resp 20  SpO2 95% Physical Exam  Constitutional: She is oriented to person, place, and time. She appears well-developed and well-nourished. No distress.  HENT:  Head: Normocephalic and atraumatic.  Right Ear: Hearing normal.  Left Ear: Hearing normal.  Nose: Nose normal.  Mouth/Throat: Oropharynx is clear and moist and mucous membranes are normal.  Eyes: Conjunctivae and EOM are normal. Pupils are equal, round, and reactive  to light.  Neck: Normal range of motion. Neck supple.  Cardiovascular: Regular rhythm, S1 normal and S2 normal.  Exam reveals no gallop and no friction rub.   No murmur heard. Pulmonary/Chest: Effort normal and breath sounds normal. No respiratory distress. She exhibits no tenderness.  Abdominal: Soft. Normal appearance and bowel sounds are normal. There is no hepatosplenomegaly. There is no tenderness. There is no rebound, no guarding, no tenderness at McBurney's point and negative Murphy's sign. No hernia.  Musculoskeletal:       Right hip: She exhibits decreased range of motion and tenderness. She exhibits no deformity.       Left hip: She exhibits decreased range of motion and tenderness. She exhibits no deformity.  Neurological: She is alert and oriented to person, place, and time. She has normal strength. No cranial nerve deficit or sensory deficit. Coordination normal. GCS eye subscore is 4. GCS verbal subscore is 5. GCS motor subscore is 6.  Skin: Skin is warm, dry and intact. No rash noted. No cyanosis.  Psychiatric: She has a normal mood and affect. Her speech is normal and behavior  is normal. Thought content normal.  Nursing note and vitals reviewed.   ED Course  Procedures (including critical care time) Labs Review Labs Reviewed  CBC WITH DIFFERENTIAL/PLATELET - Abnormal; Notable for the following:    WBC 14.4 (*)    RBC 3.14 (*)    Hemoglobin 9.8 (*)    HCT 30.8 (*)    RDW 16.2 (*)    Platelets 447 (*)    Neutro Abs 10.4 (*)    Monocytes Relative 14 (*)    Monocytes Absolute 2.0 (*)    All other components within normal limits  BASIC METABOLIC PANEL - Abnormal; Notable for the following:    Glucose, Bld 100 (*)    Calcium 8.7 (*)    GFR calc non Af Amer 51 (*)    GFR calc Af Amer 59 (*)    All other components within normal limits  URINALYSIS, ROUTINE W REFLEX MICROSCOPIC (NOT AT [facility])    Imaging Review Dg Lumbar Spine Complete  05/08/2015   CLINICAL DATA:  81 year old female with generalized pain  EXAM: LUMBAR SPINE - COMPLETE 4+ VIEW  COMPARISON:  CT dated 12/25/2014  FINDINGS: Evaluation of the lumbar spine is limited due to osteopenia and patient's body habitus.  There multilevel degenerative changes. No definite acute fracture. The total right right hip arthroplasty is noted.  IMPRESSION: Osteopenia with multilevel degenerative changes. No definite acute fracture.   Electronically Signed   By: Anner Crete M.D.   On: 05/08/2015 16:27   Dg Hips Bilat With Pelvis 3-4 Views  05/08/2015   CLINICAL DATA:  81 year old female with generalized pain.  EXAM: DG HIP (WITH OR WITHOUT PELVIS) 3-4V BILAT  COMPARISON:  CT dated 12/25/2014.  FINDINGS: There is diffuse osteopenia with multilevel degenerative changes of the lumbar spine. A total right hip arthroplasty is noted. There is no acute fracture. No dislocation.  IMPRESSION: Osteopenia.  No fracture or dislocation.  Right hip arthroplasty.   Electronically Signed   By: Anner Crete M.D.   On: 05/08/2015 16:30     EKG Interpretation None      MDM   Final diagnoses:  None   arthralgia  Patient  presents to the emergency department for evaluation of bilateral hip pain. Patient underwent right hip replacement after a hip fracture in May. She was in a nursing home for rehabilitation afterwards and was  just released. Patient indicates that she has had poor pain control throughout her stay in the rehabilitation. Examination reveals pain with range of motion of both hips, but no deformity. X-ray of both hips was unremarkable. Lumbar spine was also performed to rule out injury to the lumbar spine causing pain. This did not show any acute findings.  Case management has reviewed her case. She does have home nurse and aide as well as PT/OT set up. First visit will be tomorrow. Family was counseled that she does not have any findings that would require repeat hospitalization. She is currently taking hydrocodone. Will give prescription for oxycodone for use during the week until she can follow-up with primary doctor.    Orpah Greek, MD 05/08/15 [phone]

## 2015-05-08 NOTE — ED Notes (Signed)
Spoke with Shari Mcmahon's daughter whom resides at home where she lives and she states that she to come back to address at [address],  Pt is alert and oriented per norm NAD,  Per Karen Chafe we are awaiting [facility] arrival to transport pt to her home

## 2015-05-08 NOTE — ED Notes (Signed)
Patient just recently discharged from rehab due to hip replacement back in May. Patient is complaining of right hip pain today.

## 2015-05-08 NOTE — ED Notes (Signed)
[facility] called for transport home. 

## 2015-05-09 DIAGNOSIS — L89152 Pressure ulcer of sacral region, stage 2: Secondary | ICD-10-CM | POA: Diagnosis not present

## 2015-05-09 DIAGNOSIS — L89610 Pressure ulcer of right heel, unstageable: Secondary | ICD-10-CM | POA: Diagnosis not present

## 2015-05-09 DIAGNOSIS — S72001D Fracture of unspecified part of neck of right femur, subsequent encounter for closed fracture with routine healing: Secondary | ICD-10-CM | POA: Diagnosis not present

## 2015-05-09 DIAGNOSIS — M109 Gout, unspecified: Secondary | ICD-10-CM | POA: Diagnosis not present

## 2015-05-09 DIAGNOSIS — M170 Bilateral primary osteoarthritis of knee: Secondary | ICD-10-CM | POA: Diagnosis not present

## 2015-05-09 DIAGNOSIS — Z9181 History of falling: Secondary | ICD-10-CM | POA: Diagnosis not present

## 2015-05-09 DIAGNOSIS — F0390 Unspecified dementia without behavioral disturbance: Secondary | ICD-10-CM | POA: Diagnosis not present

## 2015-05-09 DIAGNOSIS — E119 Type 2 diabetes mellitus without complications: Secondary | ICD-10-CM | POA: Diagnosis not present

## 2015-05-09 DIAGNOSIS — L89620 Pressure ulcer of left heel, unstageable: Secondary | ICD-10-CM | POA: Diagnosis not present

## 2015-05-09 DIAGNOSIS — H409 Unspecified glaucoma: Secondary | ICD-10-CM | POA: Diagnosis not present

## 2015-05-12 DIAGNOSIS — M109 Gout, unspecified: Secondary | ICD-10-CM | POA: Diagnosis not present

## 2015-05-12 DIAGNOSIS — M170 Bilateral primary osteoarthritis of knee: Secondary | ICD-10-CM | POA: Diagnosis not present

## 2015-05-12 DIAGNOSIS — H409 Unspecified glaucoma: Secondary | ICD-10-CM | POA: Diagnosis not present

## 2015-05-12 DIAGNOSIS — Z9181 History of falling: Secondary | ICD-10-CM | POA: Diagnosis not present

## 2015-05-12 DIAGNOSIS — F0390 Unspecified dementia without behavioral disturbance: Secondary | ICD-10-CM | POA: Diagnosis not present

## 2015-05-12 DIAGNOSIS — S72001D Fracture of unspecified part of neck of right femur, subsequent encounter for closed fracture with routine healing: Secondary | ICD-10-CM | POA: Diagnosis not present

## 2015-05-12 DIAGNOSIS — E119 Type 2 diabetes mellitus without complications: Secondary | ICD-10-CM | POA: Diagnosis not present

## 2015-05-12 DIAGNOSIS — L89610 Pressure ulcer of right heel, unstageable: Secondary | ICD-10-CM | POA: Diagnosis not present

## 2015-05-12 DIAGNOSIS — L89152 Pressure ulcer of sacral region, stage 2: Secondary | ICD-10-CM | POA: Diagnosis not present

## 2015-05-12 DIAGNOSIS — L89620 Pressure ulcer of left heel, unstageable: Secondary | ICD-10-CM | POA: Diagnosis not present

## 2015-05-13 DIAGNOSIS — H409 Unspecified glaucoma: Secondary | ICD-10-CM | POA: Diagnosis not present

## 2015-05-13 DIAGNOSIS — E119 Type 2 diabetes mellitus without complications: Secondary | ICD-10-CM | POA: Diagnosis not present

## 2015-05-13 DIAGNOSIS — L89610 Pressure ulcer of right heel, unstageable: Secondary | ICD-10-CM | POA: Diagnosis not present

## 2015-05-13 DIAGNOSIS — M170 Bilateral primary osteoarthritis of knee: Secondary | ICD-10-CM | POA: Diagnosis not present

## 2015-05-13 DIAGNOSIS — L89620 Pressure ulcer of left heel, unstageable: Secondary | ICD-10-CM | POA: Diagnosis not present

## 2015-05-13 DIAGNOSIS — L89152 Pressure ulcer of sacral region, stage 2: Secondary | ICD-10-CM | POA: Diagnosis not present

## 2015-05-13 DIAGNOSIS — F0390 Unspecified dementia without behavioral disturbance: Secondary | ICD-10-CM | POA: Diagnosis not present

## 2015-05-13 DIAGNOSIS — S72001D Fracture of unspecified part of neck of right femur, subsequent encounter for closed fracture with routine healing: Secondary | ICD-10-CM | POA: Diagnosis not present

## 2015-05-13 DIAGNOSIS — M109 Gout, unspecified: Secondary | ICD-10-CM | POA: Diagnosis not present

## 2015-05-13 DIAGNOSIS — Z9181 History of falling: Secondary | ICD-10-CM | POA: Diagnosis not present

## 2015-05-14 DIAGNOSIS — E119 Type 2 diabetes mellitus without complications: Secondary | ICD-10-CM | POA: Diagnosis not present

## 2015-05-14 DIAGNOSIS — L89152 Pressure ulcer of sacral region, stage 2: Secondary | ICD-10-CM | POA: Diagnosis not present

## 2015-05-14 DIAGNOSIS — L89620 Pressure ulcer of left heel, unstageable: Secondary | ICD-10-CM | POA: Diagnosis not present

## 2015-05-14 DIAGNOSIS — S72001D Fracture of unspecified part of neck of right femur, subsequent encounter for closed fracture with routine healing: Secondary | ICD-10-CM | POA: Diagnosis not present

## 2015-05-14 DIAGNOSIS — M109 Gout, unspecified: Secondary | ICD-10-CM | POA: Diagnosis not present

## 2015-05-14 DIAGNOSIS — Z9181 History of falling: Secondary | ICD-10-CM | POA: Diagnosis not present

## 2015-05-14 DIAGNOSIS — H409 Unspecified glaucoma: Secondary | ICD-10-CM | POA: Diagnosis not present

## 2015-05-14 DIAGNOSIS — L89610 Pressure ulcer of right heel, unstageable: Secondary | ICD-10-CM | POA: Diagnosis not present

## 2015-05-14 DIAGNOSIS — F0390 Unspecified dementia without behavioral disturbance: Secondary | ICD-10-CM | POA: Diagnosis not present

## 2015-05-14 DIAGNOSIS — M170 Bilateral primary osteoarthritis of knee: Secondary | ICD-10-CM | POA: Diagnosis not present

## 2015-05-15 ENCOUNTER — Telehealth: Payer: Self-pay | Admitting: Internal Medicine

## 2015-05-15 DIAGNOSIS — H409 Unspecified glaucoma: Secondary | ICD-10-CM | POA: Diagnosis not present

## 2015-05-15 DIAGNOSIS — E119 Type 2 diabetes mellitus without complications: Secondary | ICD-10-CM | POA: Diagnosis not present

## 2015-05-15 DIAGNOSIS — M109 Gout, unspecified: Secondary | ICD-10-CM | POA: Diagnosis not present

## 2015-05-15 DIAGNOSIS — L89620 Pressure ulcer of left heel, unstageable: Secondary | ICD-10-CM | POA: Diagnosis not present

## 2015-05-15 DIAGNOSIS — L89152 Pressure ulcer of sacral region, stage 2: Secondary | ICD-10-CM | POA: Diagnosis not present

## 2015-05-15 DIAGNOSIS — F0390 Unspecified dementia without behavioral disturbance: Secondary | ICD-10-CM | POA: Diagnosis not present

## 2015-05-15 DIAGNOSIS — M170 Bilateral primary osteoarthritis of knee: Secondary | ICD-10-CM | POA: Diagnosis not present

## 2015-05-15 DIAGNOSIS — L89610 Pressure ulcer of right heel, unstageable: Secondary | ICD-10-CM | POA: Diagnosis not present

## 2015-05-15 DIAGNOSIS — S72001D Fracture of unspecified part of neck of right femur, subsequent encounter for closed fracture with routine healing: Secondary | ICD-10-CM | POA: Diagnosis not present

## 2015-05-15 DIAGNOSIS — Z9181 History of falling: Secondary | ICD-10-CM | POA: Diagnosis not present

## 2015-05-15 NOTE — Telephone Encounter (Signed)
Shari Mcmahon from [facility] called and patient has a stage 2 pressure ulcer on there sacrum to left buttock She would like to put calcium algenate silver and cover with duo derm(?) Please call her at [phone]

## 2015-05-18 DIAGNOSIS — M170 Bilateral primary osteoarthritis of knee: Secondary | ICD-10-CM | POA: Diagnosis not present

## 2015-05-18 DIAGNOSIS — L89620 Pressure ulcer of left heel, unstageable: Secondary | ICD-10-CM | POA: Diagnosis not present

## 2015-05-18 DIAGNOSIS — E119 Type 2 diabetes mellitus without complications: Secondary | ICD-10-CM | POA: Diagnosis not present

## 2015-05-18 DIAGNOSIS — M109 Gout, unspecified: Secondary | ICD-10-CM | POA: Diagnosis not present

## 2015-05-18 DIAGNOSIS — Z9181 History of falling: Secondary | ICD-10-CM | POA: Diagnosis not present

## 2015-05-18 DIAGNOSIS — L89152 Pressure ulcer of sacral region, stage 2: Secondary | ICD-10-CM | POA: Diagnosis not present

## 2015-05-18 DIAGNOSIS — L89610 Pressure ulcer of right heel, unstageable: Secondary | ICD-10-CM | POA: Diagnosis not present

## 2015-05-18 DIAGNOSIS — S72001D Fracture of unspecified part of neck of right femur, subsequent encounter for closed fracture with routine healing: Secondary | ICD-10-CM | POA: Diagnosis not present

## 2015-05-18 DIAGNOSIS — H409 Unspecified glaucoma: Secondary | ICD-10-CM | POA: Diagnosis not present

## 2015-05-18 DIAGNOSIS — F0390 Unspecified dementia without behavioral disturbance: Secondary | ICD-10-CM | POA: Diagnosis not present

## 2015-05-19 ENCOUNTER — Encounter: Payer: Self-pay | Admitting: Internal Medicine

## 2015-05-19 ENCOUNTER — Ambulatory Visit (INDEPENDENT_AMBULATORY_CARE_PROVIDER_SITE_OTHER): Payer: Medicare Other | Admitting: Internal Medicine

## 2015-05-19 VITALS — BP 106/62 | HR 83 | Temp 98.3°F

## 2015-05-19 DIAGNOSIS — L89620 Pressure ulcer of left heel, unstageable: Secondary | ICD-10-CM | POA: Diagnosis not present

## 2015-05-19 DIAGNOSIS — E119 Type 2 diabetes mellitus without complications: Secondary | ICD-10-CM | POA: Diagnosis not present

## 2015-05-19 DIAGNOSIS — I10 Essential (primary) hypertension: Secondary | ICD-10-CM

## 2015-05-19 DIAGNOSIS — M129 Arthropathy, unspecified: Secondary | ICD-10-CM

## 2015-05-19 DIAGNOSIS — Z9181 History of falling: Secondary | ICD-10-CM | POA: Diagnosis not present

## 2015-05-19 DIAGNOSIS — F0390 Unspecified dementia without behavioral disturbance: Secondary | ICD-10-CM | POA: Diagnosis not present

## 2015-05-19 DIAGNOSIS — M109 Gout, unspecified: Secondary | ICD-10-CM | POA: Diagnosis not present

## 2015-05-19 DIAGNOSIS — S72001D Fracture of unspecified part of neck of right femur, subsequent encounter for closed fracture with routine healing: Secondary | ICD-10-CM | POA: Diagnosis not present

## 2015-05-19 DIAGNOSIS — M1710 Unilateral primary osteoarthritis, unspecified knee: Secondary | ICD-10-CM | POA: Insufficient documentation

## 2015-05-19 DIAGNOSIS — H409 Unspecified glaucoma: Secondary | ICD-10-CM | POA: Diagnosis not present

## 2015-05-19 DIAGNOSIS — L89610 Pressure ulcer of right heel, unstageable: Secondary | ICD-10-CM | POA: Diagnosis not present

## 2015-05-19 DIAGNOSIS — M170 Bilateral primary osteoarthritis of knee: Secondary | ICD-10-CM | POA: Diagnosis not present

## 2015-05-19 DIAGNOSIS — L89152 Pressure ulcer of sacral region, stage 2: Secondary | ICD-10-CM | POA: Diagnosis not present

## 2015-05-19 MED ORDER — HYDROCODONE-ACETAMINOPHEN 10-325 MG PO TABS: 1.0000 | ORAL_TABLET | Freq: Three times a day (TID) | ORAL | Status: DC | PRN

## 2015-05-19 MED ORDER — PREDNISONE 10 MG PO TABS: 10.0000 mg | ORAL_TABLET | Freq: Every day | ORAL | Status: DC

## 2015-05-19 NOTE — Progress Notes (Signed)
Pre visit review using our clinic review tool, if applicable. No additional management support is needed unless otherwise documented below in the visit note. 

## 2015-05-19 NOTE — Telephone Encounter (Signed)
Ok for verbal if that is ok 

## 2015-05-19 NOTE — Progress Notes (Signed)
Subjective:    Patient ID: Shari Mcmahon, female    DOB: 1927-01-15, 81 y.o.   MRN: [id number]  HPI  Here to f/u uncontrolled ongoing postop pain - oxycodone unfort caused hallucinations, so not taking.  Uloric too expensive, asks for change back to allopurinol 100.  Not taking the potassium pill due to dizziness.  BP now increasing, asks to be back on her Bp meds as before surgury.  Pt denies chest pain, increased sob or doe, wheezing, orthopnea, PND, increased LE swelling, palpitations, dizziness or syncope. Today with acute onset mod to severe bilat knee effusions and pain without trauma or fever.  Has bilat small heel ulcers nearly healed with wound care.  Pt denies polydipsia, polyuria, Pt denies new neurological symptoms such as new headache, or facial or extremity weakness or numbness  Past Medical History  Diagnosis Date  . ABDOMINAL PAIN, CHRONIC 04/07/2008  . ARTHRITIS 03/03/2008  . BRADYCARDIA, CHRONIC 12/17/2008  . CARDIAC MURMUR 02/01/2010  . CONSTIPATION, CHRONIC 10/15/2010  . DEGENERATIVE JOINT DISEASE, KNEES, BILATERAL 12/07/2007  . DEPRESSION 06/17/2009  . DIABETES MELLITUS, TYPE II 09/19/2007    DIET CONTROL   . DYSPHAGIA UNSPECIFIED 12/24/2009  . Gastroparesis 12/24/2009  . GENERALIZED OSTEOARTHROSIS INVOLVING HAND 10/03/2007  . GLAUCOMA 09/19/2007  . GOUTY ARTHROPATHY UNSPECIFIED 02/17/2009  . GOUT 09/19/2007  . HYPERLIPIDEMIA 01/27/2010  . HYPERTENSION 09/19/2007  . LUNG NODULE 03/03/2008  . MENOPAUSAL DISORDER 12/17/2008  . PERIPHERAL NEUROPATHY 06/19/2008  . PERSONAL HISTORY MALIGNANT NEOPLASM STOMACH 09/19/2007  . 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 STOMACH CANCER 03/03/2008  . UTI 12/15/2009  . Arthritis of knee, degenerative 10/19/2011   Past Surgical History  Procedure Laterality Date  . Vagotomy    . Partial gastrectomy    . Billroth ii gastorenterostomy    . Colonoscopy      reports that she has quit smoking. Her smoking use included Cigarettes.  She has never used smokeless tobacco. She reports that she does not drink alcohol or use illicit drugs. family history includes Cancer in her sister; Colon cancer in her maternal grandmother and paternal grandfather; Diabetes in her other; Prostate cancer in her brother; Stroke in her father; Uterine cancer in her mother. There is no history of Stomach cancer. Allergies  Allergen Reactions  . Voltaren [Diclofenac Sodium] Other (See Comments)    Stomach irritation  . Dulcolax [Bisacodyl]     Unknown   . Duloxetine Hcl Other (See Comments)    Made patient not want to eat or drink  . Penicillins Swelling    Unknown   . Timolol     REACTION: bradycardia worse   Current Outpatient Prescriptions on File Prior to Visit  Medication Sig Dispense Refill  . acetaminophen (TYLENOL) 500 MG tablet Take 500 mg by mouth every 6 (six) hours as needed for mild pain or moderate pain.     . divalproex (DEPAKOTE SPRINKLE) 125 MG capsule Take 125 mg by mouth.    Marland Kitchen glucose blood (FREESTYLE LITE) test strip Use as instructed 100 each 11  . latanoprost (XALATAN) 0.005 % ophthalmic solution Place 1 drop into both eyes at bedtime.     . Magnesium Hydroxide (MILK OF MAGNESIA PO) Take 15 mLs by mouth daily.    . mirtazapine (REMERON) 7.5 MG tablet Take 7.5 mg by mouth at bedtime.    . Multiple Vitamins-Minerals (MULTIVITAMIN ADULT PO) Take 1 tablet by mouth daily.    Marland Kitchen omeprazole (PRILOSEC) 40 MG capsule Take  1 capsule (40 mg total) by mouth daily. 90 capsule 3  . OVER THE COUNTER MEDICATION Take 8 oz by mouth 3 (three) times daily. MedPass supplement    . oxyCODONE-acetaminophen (PERCOCET) 5-325 MG per tablet Take 1 tablet by mouth every 4 (four) hours as needed. 20 tablet 0  . Pollen Extracts (PROSTAT PO) Take 30 mLs by mouth 2 (two) times daily.    . polyethylene glycol (MIRALAX / GLYCOLAX) packet Take 17 g by mouth daily. Mix 17 grams in 8 oz juice/water and drink by mouth once daily    . pravastatin (PRAVACHOL)  10 MG tablet Take 10 mg by mouth at bedtime.     Marland Kitchen PRESCRIPTION MEDICATION Skin prep. Apply to blisters on right and left heels twice daily    . senna (SENOKOT) 8.6 MG tablet Take 1 tablet by mouth 2 (two) times daily.    Marland Kitchen ZINC OXIDE, TOPICAL, 10 % CREA Apply 1 application topically 3 (three) times daily as needed (Apply topically to sacrum and buttocks 3 times daily as needed).     No current facility-administered medications on file prior to visit.   Review of Systems  Constitutional: Negative for unusual diaphoresis or night sweats HENT: Negative for ringing in ear or discharge Eyes: Negative for double vision or worsening visual disturbance.  Respiratory: Negative for choking and stridor.   Gastrointestinal: Negative for vomiting or other signifcant bowel change Genitourinary: Negative for hematuria or change in urine volume.  Musculoskeletal: Negative for other MSK pain or swelling Skin: Negative for color change and worsening wound.  Neurological: Negative for tremors and numbness other than noted  Psychiatric/Behavioral: Negative for decreased concentration or agitation other than above       Objective:   Physical Exam BP 106/62 mmHg  Pulse 83  Temp(Src) 98.3 F (36.8 C) (Oral)  SpO2 99% VS noted,  Constitutional: Pt appears in no significant distress HENT: Head: NCAT.  Right Ear: External ear normal.  Left Ear: External ear normal.  Eyes: . Pupils are equal, round, and reactive to light. Conjunctivae and EOM are normal Neck: Normal range of motion. Neck supple.  Cardiovascular: Normal rate and regular rhythm.   Pulmonary/Chest: Effort normal and breath sounds without rales or wheezing.  Bilat knee with warm 1-2+ effusions right > left Neurological: Pt is alert. Not confused , motor grossly intact Skin: Skin is warm. No rash, no LE edema, small superficial ulcers with out drainage or skin change post heels Psychiatric: Pt behavior is normal. No agitation.       Assessment & Plan:

## 2015-05-19 NOTE — Patient Instructions (Signed)
You had the steroid shot today  Please take all new medication as prescribed - the prednisone, and the higher strength hydrocodone  Please continue all other medications as before, and refills have been done if requested - the allopurinol instead of the uloric  Please have the pharmacy call with any other refills you may need.  Please continue your efforts at being more active, low cholesterol diet, and weight control.  Please keep your appointments with your specialists as you may have planned - Dr Sharol Given for July 21, as well as the home health /[facility] for PT, and the wound clinic as planned

## 2015-05-19 NOTE — Telephone Encounter (Signed)
Verbal authorization given per MD

## 2015-05-20 ENCOUNTER — Other Ambulatory Visit: Payer: Self-pay | Admitting: Internal Medicine

## 2015-05-20 DIAGNOSIS — L89610 Pressure ulcer of right heel, unstageable: Secondary | ICD-10-CM | POA: Diagnosis not present

## 2015-05-20 DIAGNOSIS — S72001D Fracture of unspecified part of neck of right femur, subsequent encounter for closed fracture with routine healing: Secondary | ICD-10-CM | POA: Diagnosis not present

## 2015-05-20 DIAGNOSIS — F0390 Unspecified dementia without behavioral disturbance: Secondary | ICD-10-CM | POA: Diagnosis not present

## 2015-05-20 DIAGNOSIS — Z9181 History of falling: Secondary | ICD-10-CM | POA: Diagnosis not present

## 2015-05-20 DIAGNOSIS — M170 Bilateral primary osteoarthritis of knee: Secondary | ICD-10-CM | POA: Diagnosis not present

## 2015-05-20 DIAGNOSIS — H409 Unspecified glaucoma: Secondary | ICD-10-CM | POA: Diagnosis not present

## 2015-05-20 DIAGNOSIS — L89620 Pressure ulcer of left heel, unstageable: Secondary | ICD-10-CM | POA: Diagnosis not present

## 2015-05-20 DIAGNOSIS — M109 Gout, unspecified: Secondary | ICD-10-CM | POA: Diagnosis not present

## 2015-05-20 DIAGNOSIS — E119 Type 2 diabetes mellitus without complications: Secondary | ICD-10-CM | POA: Diagnosis not present

## 2015-05-20 DIAGNOSIS — L89152 Pressure ulcer of sacral region, stage 2: Secondary | ICD-10-CM | POA: Diagnosis not present

## 2015-05-21 DIAGNOSIS — L89620 Pressure ulcer of left heel, unstageable: Secondary | ICD-10-CM | POA: Diagnosis not present

## 2015-05-21 DIAGNOSIS — M170 Bilateral primary osteoarthritis of knee: Secondary | ICD-10-CM | POA: Diagnosis not present

## 2015-05-21 DIAGNOSIS — F0390 Unspecified dementia without behavioral disturbance: Secondary | ICD-10-CM | POA: Diagnosis not present

## 2015-05-21 DIAGNOSIS — S72001D Fracture of unspecified part of neck of right femur, subsequent encounter for closed fracture with routine healing: Secondary | ICD-10-CM | POA: Diagnosis not present

## 2015-05-21 DIAGNOSIS — L89610 Pressure ulcer of right heel, unstageable: Secondary | ICD-10-CM | POA: Diagnosis not present

## 2015-05-21 DIAGNOSIS — M25562 Pain in left knee: Secondary | ICD-10-CM | POA: Diagnosis not present

## 2015-05-21 DIAGNOSIS — M25552 Pain in left hip: Secondary | ICD-10-CM | POA: Diagnosis not present

## 2015-05-21 DIAGNOSIS — Z9181 History of falling: Secondary | ICD-10-CM | POA: Diagnosis not present

## 2015-05-21 DIAGNOSIS — M109 Gout, unspecified: Secondary | ICD-10-CM | POA: Diagnosis not present

## 2015-05-21 DIAGNOSIS — L89152 Pressure ulcer of sacral region, stage 2: Secondary | ICD-10-CM | POA: Diagnosis not present

## 2015-05-21 DIAGNOSIS — E119 Type 2 diabetes mellitus without complications: Secondary | ICD-10-CM | POA: Diagnosis not present

## 2015-05-21 DIAGNOSIS — M25551 Pain in right hip: Secondary | ICD-10-CM | POA: Diagnosis not present

## 2015-05-21 DIAGNOSIS — H409 Unspecified glaucoma: Secondary | ICD-10-CM | POA: Diagnosis not present

## 2015-05-21 DIAGNOSIS — M25561 Pain in right knee: Secondary | ICD-10-CM | POA: Diagnosis not present

## 2015-05-22 DIAGNOSIS — Z9181 History of falling: Secondary | ICD-10-CM | POA: Diagnosis not present

## 2015-05-22 DIAGNOSIS — M170 Bilateral primary osteoarthritis of knee: Secondary | ICD-10-CM | POA: Diagnosis not present

## 2015-05-22 DIAGNOSIS — H409 Unspecified glaucoma: Secondary | ICD-10-CM | POA: Diagnosis not present

## 2015-05-22 DIAGNOSIS — F0390 Unspecified dementia without behavioral disturbance: Secondary | ICD-10-CM | POA: Diagnosis not present

## 2015-05-22 DIAGNOSIS — L89152 Pressure ulcer of sacral region, stage 2: Secondary | ICD-10-CM | POA: Diagnosis not present

## 2015-05-22 DIAGNOSIS — E119 Type 2 diabetes mellitus without complications: Secondary | ICD-10-CM | POA: Diagnosis not present

## 2015-05-22 DIAGNOSIS — L89610 Pressure ulcer of right heel, unstageable: Secondary | ICD-10-CM | POA: Diagnosis not present

## 2015-05-22 DIAGNOSIS — S72001D Fracture of unspecified part of neck of right femur, subsequent encounter for closed fracture with routine healing: Secondary | ICD-10-CM | POA: Diagnosis not present

## 2015-05-22 DIAGNOSIS — M109 Gout, unspecified: Secondary | ICD-10-CM | POA: Diagnosis not present

## 2015-05-22 DIAGNOSIS — L89620 Pressure ulcer of left heel, unstageable: Secondary | ICD-10-CM | POA: Diagnosis not present

## 2015-05-24 NOTE — Assessment & Plan Note (Signed)
stable overall by history and exam, recent data reviewed with pt, and pt to continue medical treatment as before,  to f/u any worsening symptoms or concerns BP Readings from Last 3 Encounters:  05/19/15 106/62  05/08/15 122/53  03/05/15 118/70

## 2015-05-24 NOTE — Assessment & Plan Note (Signed)
stable overall by history and exam, recent data reviewed with pt, and pt to continue medical treatment as before,  to f/u any worsening symptoms or concerns Lab Results  Component Value Date   HGBA1C 6.0 06/10/2014   To call for onset polys or cbg > 200 with steroid tx

## 2015-05-24 NOTE — Assessment & Plan Note (Signed)
Diff includes oa flare vs gout - for depomedrol IM, predpac asd, allopurinol asd, hydrocodone prn,  to f/u any worsening symptoms or concerns

## 2015-05-25 DIAGNOSIS — Z9181 History of falling: Secondary | ICD-10-CM | POA: Diagnosis not present

## 2015-05-25 DIAGNOSIS — L89610 Pressure ulcer of right heel, unstageable: Secondary | ICD-10-CM | POA: Diagnosis not present

## 2015-05-25 DIAGNOSIS — F0390 Unspecified dementia without behavioral disturbance: Secondary | ICD-10-CM | POA: Diagnosis not present

## 2015-05-25 DIAGNOSIS — H409 Unspecified glaucoma: Secondary | ICD-10-CM | POA: Diagnosis not present

## 2015-05-25 DIAGNOSIS — M170 Bilateral primary osteoarthritis of knee: Secondary | ICD-10-CM | POA: Diagnosis not present

## 2015-05-25 DIAGNOSIS — L89152 Pressure ulcer of sacral region, stage 2: Secondary | ICD-10-CM | POA: Diagnosis not present

## 2015-05-25 DIAGNOSIS — S72001D Fracture of unspecified part of neck of right femur, subsequent encounter for closed fracture with routine healing: Secondary | ICD-10-CM | POA: Diagnosis not present

## 2015-05-25 DIAGNOSIS — L89620 Pressure ulcer of left heel, unstageable: Secondary | ICD-10-CM | POA: Diagnosis not present

## 2015-05-25 DIAGNOSIS — E119 Type 2 diabetes mellitus without complications: Secondary | ICD-10-CM | POA: Diagnosis not present

## 2015-05-25 DIAGNOSIS — M109 Gout, unspecified: Secondary | ICD-10-CM | POA: Diagnosis not present

## 2015-05-26 DIAGNOSIS — Z9181 History of falling: Secondary | ICD-10-CM | POA: Diagnosis not present

## 2015-05-26 DIAGNOSIS — L89610 Pressure ulcer of right heel, unstageable: Secondary | ICD-10-CM | POA: Diagnosis not present

## 2015-05-26 DIAGNOSIS — E119 Type 2 diabetes mellitus without complications: Secondary | ICD-10-CM | POA: Diagnosis not present

## 2015-05-26 DIAGNOSIS — L89620 Pressure ulcer of left heel, unstageable: Secondary | ICD-10-CM | POA: Diagnosis not present

## 2015-05-26 DIAGNOSIS — S72001D Fracture of unspecified part of neck of right femur, subsequent encounter for closed fracture with routine healing: Secondary | ICD-10-CM | POA: Diagnosis not present

## 2015-05-26 DIAGNOSIS — H409 Unspecified glaucoma: Secondary | ICD-10-CM | POA: Diagnosis not present

## 2015-05-26 DIAGNOSIS — M170 Bilateral primary osteoarthritis of knee: Secondary | ICD-10-CM | POA: Diagnosis not present

## 2015-05-26 DIAGNOSIS — F0390 Unspecified dementia without behavioral disturbance: Secondary | ICD-10-CM | POA: Diagnosis not present

## 2015-05-26 DIAGNOSIS — M109 Gout, unspecified: Secondary | ICD-10-CM | POA: Diagnosis not present

## 2015-05-26 DIAGNOSIS — L89152 Pressure ulcer of sacral region, stage 2: Secondary | ICD-10-CM | POA: Diagnosis not present

## 2015-05-27 ENCOUNTER — Encounter (HOSPITAL_BASED_OUTPATIENT_CLINIC_OR_DEPARTMENT_OTHER): Payer: Medicare Other | Attending: Surgery

## 2015-05-27 DIAGNOSIS — Z87891 Personal history of nicotine dependence: Secondary | ICD-10-CM | POA: Diagnosis not present

## 2015-05-27 DIAGNOSIS — L89313 Pressure ulcer of right buttock, stage 3: Secondary | ICD-10-CM | POA: Diagnosis not present

## 2015-05-27 DIAGNOSIS — L89153 Pressure ulcer of sacral region, stage 3: Secondary | ICD-10-CM | POA: Insufficient documentation

## 2015-05-27 DIAGNOSIS — L89622 Pressure ulcer of left heel, stage 2: Secondary | ICD-10-CM | POA: Diagnosis not present

## 2015-05-27 DIAGNOSIS — L89613 Pressure ulcer of right heel, stage 3: Secondary | ICD-10-CM | POA: Insufficient documentation

## 2015-05-27 DIAGNOSIS — E11621 Type 2 diabetes mellitus with foot ulcer: Secondary | ICD-10-CM | POA: Diagnosis not present

## 2015-05-27 LAB — GLUCOSE, CAPILLARY: Glucose-Capillary: 96 mg/dL (ref 65–99)

## 2015-05-28 DIAGNOSIS — Z9181 History of falling: Secondary | ICD-10-CM | POA: Diagnosis not present

## 2015-05-28 DIAGNOSIS — H409 Unspecified glaucoma: Secondary | ICD-10-CM | POA: Diagnosis not present

## 2015-05-28 DIAGNOSIS — M170 Bilateral primary osteoarthritis of knee: Secondary | ICD-10-CM | POA: Diagnosis not present

## 2015-05-28 DIAGNOSIS — S72001D Fracture of unspecified part of neck of right femur, subsequent encounter for closed fracture with routine healing: Secondary | ICD-10-CM | POA: Diagnosis not present

## 2015-05-28 DIAGNOSIS — L89152 Pressure ulcer of sacral region, stage 2: Secondary | ICD-10-CM | POA: Diagnosis not present

## 2015-05-28 DIAGNOSIS — M109 Gout, unspecified: Secondary | ICD-10-CM | POA: Diagnosis not present

## 2015-05-28 DIAGNOSIS — L89610 Pressure ulcer of right heel, unstageable: Secondary | ICD-10-CM | POA: Diagnosis not present

## 2015-05-28 DIAGNOSIS — E119 Type 2 diabetes mellitus without complications: Secondary | ICD-10-CM | POA: Diagnosis not present

## 2015-05-28 DIAGNOSIS — L89620 Pressure ulcer of left heel, unstageable: Secondary | ICD-10-CM | POA: Diagnosis not present

## 2015-05-28 DIAGNOSIS — F0390 Unspecified dementia without behavioral disturbance: Secondary | ICD-10-CM | POA: Diagnosis not present

## 2015-05-29 DIAGNOSIS — F0390 Unspecified dementia without behavioral disturbance: Secondary | ICD-10-CM | POA: Diagnosis not present

## 2015-05-29 DIAGNOSIS — Z9181 History of falling: Secondary | ICD-10-CM | POA: Diagnosis not present

## 2015-05-29 DIAGNOSIS — M170 Bilateral primary osteoarthritis of knee: Secondary | ICD-10-CM | POA: Diagnosis not present

## 2015-05-29 DIAGNOSIS — S72001D Fracture of unspecified part of neck of right femur, subsequent encounter for closed fracture with routine healing: Secondary | ICD-10-CM | POA: Diagnosis not present

## 2015-05-29 DIAGNOSIS — L89152 Pressure ulcer of sacral region, stage 2: Secondary | ICD-10-CM | POA: Diagnosis not present

## 2015-05-29 DIAGNOSIS — E119 Type 2 diabetes mellitus without complications: Secondary | ICD-10-CM | POA: Diagnosis not present

## 2015-05-29 DIAGNOSIS — L89620 Pressure ulcer of left heel, unstageable: Secondary | ICD-10-CM | POA: Diagnosis not present

## 2015-05-29 DIAGNOSIS — H409 Unspecified glaucoma: Secondary | ICD-10-CM | POA: Diagnosis not present

## 2015-05-29 DIAGNOSIS — M109 Gout, unspecified: Secondary | ICD-10-CM | POA: Diagnosis not present

## 2015-05-29 DIAGNOSIS — L89610 Pressure ulcer of right heel, unstageable: Secondary | ICD-10-CM | POA: Diagnosis not present

## 2015-06-01 ENCOUNTER — Telehealth: Payer: Self-pay | Admitting: Internal Medicine

## 2015-06-01 DIAGNOSIS — S72001D Fracture of unspecified part of neck of right femur, subsequent encounter for closed fracture with routine healing: Secondary | ICD-10-CM | POA: Diagnosis not present

## 2015-06-01 DIAGNOSIS — L89620 Pressure ulcer of left heel, unstageable: Secondary | ICD-10-CM | POA: Diagnosis not present

## 2015-06-01 DIAGNOSIS — L89152 Pressure ulcer of sacral region, stage 2: Secondary | ICD-10-CM | POA: Diagnosis not present

## 2015-06-01 DIAGNOSIS — H409 Unspecified glaucoma: Secondary | ICD-10-CM | POA: Diagnosis not present

## 2015-06-01 DIAGNOSIS — Z9181 History of falling: Secondary | ICD-10-CM | POA: Diagnosis not present

## 2015-06-01 DIAGNOSIS — F0390 Unspecified dementia without behavioral disturbance: Secondary | ICD-10-CM | POA: Diagnosis not present

## 2015-06-01 DIAGNOSIS — M170 Bilateral primary osteoarthritis of knee: Secondary | ICD-10-CM | POA: Diagnosis not present

## 2015-06-01 DIAGNOSIS — L89610 Pressure ulcer of right heel, unstageable: Secondary | ICD-10-CM | POA: Diagnosis not present

## 2015-06-01 DIAGNOSIS — M109 Gout, unspecified: Secondary | ICD-10-CM | POA: Diagnosis not present

## 2015-06-01 DIAGNOSIS — E119 Type 2 diabetes mellitus without complications: Secondary | ICD-10-CM | POA: Diagnosis not present

## 2015-06-01 NOTE — Telephone Encounter (Signed)
Shari Mcmahon called in and said that she had quite a few questions about her mother and getting her placed somewhere.  She is requesting a call back as soon as possible .     Best number [phone]

## 2015-06-01 NOTE — Telephone Encounter (Signed)
Shari Mcmahon from [facility] called and family would like to continue home health aid and needs your approval. Cindy's number is [phone]

## 2015-06-02 ENCOUNTER — Encounter: Payer: Self-pay | Admitting: Internal Medicine

## 2015-06-02 DIAGNOSIS — L89610 Pressure ulcer of right heel, unstageable: Secondary | ICD-10-CM | POA: Diagnosis not present

## 2015-06-02 DIAGNOSIS — L89620 Pressure ulcer of left heel, unstageable: Secondary | ICD-10-CM | POA: Diagnosis not present

## 2015-06-02 DIAGNOSIS — L89152 Pressure ulcer of sacral region, stage 2: Secondary | ICD-10-CM | POA: Diagnosis not present

## 2015-06-02 DIAGNOSIS — M170 Bilateral primary osteoarthritis of knee: Secondary | ICD-10-CM | POA: Diagnosis not present

## 2015-06-02 DIAGNOSIS — H409 Unspecified glaucoma: Secondary | ICD-10-CM | POA: Diagnosis not present

## 2015-06-02 DIAGNOSIS — E119 Type 2 diabetes mellitus without complications: Secondary | ICD-10-CM | POA: Diagnosis not present

## 2015-06-02 DIAGNOSIS — F0390 Unspecified dementia without behavioral disturbance: Secondary | ICD-10-CM | POA: Diagnosis not present

## 2015-06-02 DIAGNOSIS — Z9181 History of falling: Secondary | ICD-10-CM | POA: Diagnosis not present

## 2015-06-02 DIAGNOSIS — S72001D Fracture of unspecified part of neck of right femur, subsequent encounter for closed fracture with routine healing: Secondary | ICD-10-CM | POA: Diagnosis not present

## 2015-06-02 DIAGNOSIS — M109 Gout, unspecified: Secondary | ICD-10-CM | POA: Diagnosis not present

## 2015-06-02 NOTE — Telephone Encounter (Signed)
See MyChart message dated 06/02/2015

## 2015-06-02 NOTE — Telephone Encounter (Signed)
Verbal authorization given per MD protocol 

## 2015-06-03 ENCOUNTER — Encounter (HOSPITAL_BASED_OUTPATIENT_CLINIC_OR_DEPARTMENT_OTHER): Payer: Medicare Other | Attending: Surgery

## 2015-06-03 DIAGNOSIS — E11621 Type 2 diabetes mellitus with foot ulcer: Secondary | ICD-10-CM | POA: Diagnosis not present

## 2015-06-03 DIAGNOSIS — L89153 Pressure ulcer of sacral region, stage 3: Secondary | ICD-10-CM | POA: Diagnosis not present

## 2015-06-03 DIAGNOSIS — I10 Essential (primary) hypertension: Secondary | ICD-10-CM | POA: Insufficient documentation

## 2015-06-03 DIAGNOSIS — L89622 Pressure ulcer of left heel, stage 2: Secondary | ICD-10-CM | POA: Insufficient documentation

## 2015-06-03 DIAGNOSIS — M1990 Unspecified osteoarthritis, unspecified site: Secondary | ICD-10-CM | POA: Insufficient documentation

## 2015-06-03 DIAGNOSIS — E1140 Type 2 diabetes mellitus with diabetic neuropathy, unspecified: Secondary | ICD-10-CM | POA: Insufficient documentation

## 2015-06-03 DIAGNOSIS — L89613 Pressure ulcer of right heel, stage 3: Secondary | ICD-10-CM | POA: Diagnosis not present

## 2015-06-04 DIAGNOSIS — M109 Gout, unspecified: Secondary | ICD-10-CM | POA: Diagnosis not present

## 2015-06-04 DIAGNOSIS — L89152 Pressure ulcer of sacral region, stage 2: Secondary | ICD-10-CM | POA: Diagnosis not present

## 2015-06-04 DIAGNOSIS — H409 Unspecified glaucoma: Secondary | ICD-10-CM | POA: Diagnosis not present

## 2015-06-04 DIAGNOSIS — L89610 Pressure ulcer of right heel, unstageable: Secondary | ICD-10-CM | POA: Diagnosis not present

## 2015-06-04 DIAGNOSIS — S72001D Fracture of unspecified part of neck of right femur, subsequent encounter for closed fracture with routine healing: Secondary | ICD-10-CM | POA: Diagnosis not present

## 2015-06-04 DIAGNOSIS — F0390 Unspecified dementia without behavioral disturbance: Secondary | ICD-10-CM | POA: Diagnosis not present

## 2015-06-04 DIAGNOSIS — M170 Bilateral primary osteoarthritis of knee: Secondary | ICD-10-CM | POA: Diagnosis not present

## 2015-06-04 DIAGNOSIS — Z9181 History of falling: Secondary | ICD-10-CM | POA: Diagnosis not present

## 2015-06-04 DIAGNOSIS — E119 Type 2 diabetes mellitus without complications: Secondary | ICD-10-CM | POA: Diagnosis not present

## 2015-06-04 DIAGNOSIS — L89620 Pressure ulcer of left heel, unstageable: Secondary | ICD-10-CM | POA: Diagnosis not present

## 2015-06-05 DIAGNOSIS — F0390 Unspecified dementia without behavioral disturbance: Secondary | ICD-10-CM | POA: Diagnosis not present

## 2015-06-05 DIAGNOSIS — M170 Bilateral primary osteoarthritis of knee: Secondary | ICD-10-CM | POA: Diagnosis not present

## 2015-06-05 DIAGNOSIS — Z9181 History of falling: Secondary | ICD-10-CM | POA: Diagnosis not present

## 2015-06-05 DIAGNOSIS — L89620 Pressure ulcer of left heel, unstageable: Secondary | ICD-10-CM | POA: Diagnosis not present

## 2015-06-05 DIAGNOSIS — L89152 Pressure ulcer of sacral region, stage 2: Secondary | ICD-10-CM | POA: Diagnosis not present

## 2015-06-05 DIAGNOSIS — S72001D Fracture of unspecified part of neck of right femur, subsequent encounter for closed fracture with routine healing: Secondary | ICD-10-CM | POA: Diagnosis not present

## 2015-06-05 DIAGNOSIS — M109 Gout, unspecified: Secondary | ICD-10-CM | POA: Diagnosis not present

## 2015-06-05 DIAGNOSIS — L89610 Pressure ulcer of right heel, unstageable: Secondary | ICD-10-CM | POA: Diagnosis not present

## 2015-06-05 DIAGNOSIS — E119 Type 2 diabetes mellitus without complications: Secondary | ICD-10-CM | POA: Diagnosis not present

## 2015-06-05 DIAGNOSIS — H409 Unspecified glaucoma: Secondary | ICD-10-CM | POA: Diagnosis not present

## 2015-06-06 DIAGNOSIS — Z9181 History of falling: Secondary | ICD-10-CM | POA: Diagnosis not present

## 2015-06-06 DIAGNOSIS — M109 Gout, unspecified: Secondary | ICD-10-CM | POA: Diagnosis not present

## 2015-06-06 DIAGNOSIS — S72001D Fracture of unspecified part of neck of right femur, subsequent encounter for closed fracture with routine healing: Secondary | ICD-10-CM | POA: Diagnosis not present

## 2015-06-09 DIAGNOSIS — F0390 Unspecified dementia without behavioral disturbance: Secondary | ICD-10-CM | POA: Diagnosis not present

## 2015-06-09 DIAGNOSIS — H409 Unspecified glaucoma: Secondary | ICD-10-CM | POA: Diagnosis not present

## 2015-06-09 DIAGNOSIS — L89152 Pressure ulcer of sacral region, stage 2: Secondary | ICD-10-CM | POA: Diagnosis not present

## 2015-06-09 DIAGNOSIS — M109 Gout, unspecified: Secondary | ICD-10-CM | POA: Diagnosis not present

## 2015-06-09 DIAGNOSIS — L89620 Pressure ulcer of left heel, unstageable: Secondary | ICD-10-CM | POA: Diagnosis not present

## 2015-06-09 DIAGNOSIS — M170 Bilateral primary osteoarthritis of knee: Secondary | ICD-10-CM | POA: Diagnosis not present

## 2015-06-09 DIAGNOSIS — Z9181 History of falling: Secondary | ICD-10-CM | POA: Diagnosis not present

## 2015-06-09 DIAGNOSIS — S72001D Fracture of unspecified part of neck of right femur, subsequent encounter for closed fracture with routine healing: Secondary | ICD-10-CM | POA: Diagnosis not present

## 2015-06-09 DIAGNOSIS — L89610 Pressure ulcer of right heel, unstageable: Secondary | ICD-10-CM | POA: Diagnosis not present

## 2015-06-09 DIAGNOSIS — E119 Type 2 diabetes mellitus without complications: Secondary | ICD-10-CM | POA: Diagnosis not present

## 2015-06-10 DIAGNOSIS — L89613 Pressure ulcer of right heel, stage 3: Secondary | ICD-10-CM | POA: Diagnosis not present

## 2015-06-10 DIAGNOSIS — M1990 Unspecified osteoarthritis, unspecified site: Secondary | ICD-10-CM | POA: Diagnosis not present

## 2015-06-10 DIAGNOSIS — L89622 Pressure ulcer of left heel, stage 2: Secondary | ICD-10-CM | POA: Diagnosis not present

## 2015-06-10 DIAGNOSIS — E1140 Type 2 diabetes mellitus with diabetic neuropathy, unspecified: Secondary | ICD-10-CM | POA: Diagnosis not present

## 2015-06-10 DIAGNOSIS — E11621 Type 2 diabetes mellitus with foot ulcer: Secondary | ICD-10-CM | POA: Diagnosis not present

## 2015-06-10 DIAGNOSIS — I10 Essential (primary) hypertension: Secondary | ICD-10-CM | POA: Diagnosis not present

## 2015-06-10 DIAGNOSIS — L89153 Pressure ulcer of sacral region, stage 3: Secondary | ICD-10-CM | POA: Diagnosis not present

## 2015-06-11 DIAGNOSIS — M170 Bilateral primary osteoarthritis of knee: Secondary | ICD-10-CM | POA: Diagnosis not present

## 2015-06-11 DIAGNOSIS — L89610 Pressure ulcer of right heel, unstageable: Secondary | ICD-10-CM | POA: Diagnosis not present

## 2015-06-11 DIAGNOSIS — S72001D Fracture of unspecified part of neck of right femur, subsequent encounter for closed fracture with routine healing: Secondary | ICD-10-CM | POA: Diagnosis not present

## 2015-06-11 DIAGNOSIS — F0390 Unspecified dementia without behavioral disturbance: Secondary | ICD-10-CM | POA: Diagnosis not present

## 2015-06-11 DIAGNOSIS — L89620 Pressure ulcer of left heel, unstageable: Secondary | ICD-10-CM | POA: Diagnosis not present

## 2015-06-11 DIAGNOSIS — Z9181 History of falling: Secondary | ICD-10-CM | POA: Diagnosis not present

## 2015-06-11 DIAGNOSIS — H409 Unspecified glaucoma: Secondary | ICD-10-CM | POA: Diagnosis not present

## 2015-06-11 DIAGNOSIS — L89152 Pressure ulcer of sacral region, stage 2: Secondary | ICD-10-CM | POA: Diagnosis not present

## 2015-06-11 DIAGNOSIS — E119 Type 2 diabetes mellitus without complications: Secondary | ICD-10-CM | POA: Diagnosis not present

## 2015-06-11 DIAGNOSIS — M109 Gout, unspecified: Secondary | ICD-10-CM | POA: Diagnosis not present

## 2015-06-12 DIAGNOSIS — E119 Type 2 diabetes mellitus without complications: Secondary | ICD-10-CM | POA: Diagnosis not present

## 2015-06-12 DIAGNOSIS — M170 Bilateral primary osteoarthritis of knee: Secondary | ICD-10-CM | POA: Diagnosis not present

## 2015-06-12 DIAGNOSIS — L89610 Pressure ulcer of right heel, unstageable: Secondary | ICD-10-CM | POA: Diagnosis not present

## 2015-06-12 DIAGNOSIS — H409 Unspecified glaucoma: Secondary | ICD-10-CM | POA: Diagnosis not present

## 2015-06-12 DIAGNOSIS — S72001D Fracture of unspecified part of neck of right femur, subsequent encounter for closed fracture with routine healing: Secondary | ICD-10-CM | POA: Diagnosis not present

## 2015-06-12 DIAGNOSIS — Z9181 History of falling: Secondary | ICD-10-CM | POA: Diagnosis not present

## 2015-06-12 DIAGNOSIS — Z0279 Encounter for issue of other medical certificate: Secondary | ICD-10-CM

## 2015-06-12 DIAGNOSIS — F0390 Unspecified dementia without behavioral disturbance: Secondary | ICD-10-CM | POA: Diagnosis not present

## 2015-06-12 DIAGNOSIS — L89620 Pressure ulcer of left heel, unstageable: Secondary | ICD-10-CM | POA: Diagnosis not present

## 2015-06-12 DIAGNOSIS — M109 Gout, unspecified: Secondary | ICD-10-CM | POA: Diagnosis not present

## 2015-06-12 DIAGNOSIS — L89152 Pressure ulcer of sacral region, stage 2: Secondary | ICD-10-CM | POA: Diagnosis not present

## 2015-06-15 ENCOUNTER — Telehealth: Payer: Self-pay | Admitting: *Deleted

## 2015-06-15 DIAGNOSIS — L89610 Pressure ulcer of right heel, unstageable: Secondary | ICD-10-CM | POA: Diagnosis not present

## 2015-06-15 DIAGNOSIS — H409 Unspecified glaucoma: Secondary | ICD-10-CM | POA: Diagnosis not present

## 2015-06-15 DIAGNOSIS — M170 Bilateral primary osteoarthritis of knee: Secondary | ICD-10-CM | POA: Diagnosis not present

## 2015-06-15 DIAGNOSIS — E119 Type 2 diabetes mellitus without complications: Secondary | ICD-10-CM | POA: Diagnosis not present

## 2015-06-15 DIAGNOSIS — F0390 Unspecified dementia without behavioral disturbance: Secondary | ICD-10-CM | POA: Diagnosis not present

## 2015-06-15 DIAGNOSIS — L89620 Pressure ulcer of left heel, unstageable: Secondary | ICD-10-CM | POA: Diagnosis not present

## 2015-06-15 DIAGNOSIS — M109 Gout, unspecified: Secondary | ICD-10-CM | POA: Diagnosis not present

## 2015-06-15 DIAGNOSIS — S72001D Fracture of unspecified part of neck of right femur, subsequent encounter for closed fracture with routine healing: Secondary | ICD-10-CM | POA: Diagnosis not present

## 2015-06-15 DIAGNOSIS — L89152 Pressure ulcer of sacral region, stage 2: Secondary | ICD-10-CM | POA: Diagnosis not present

## 2015-06-15 DIAGNOSIS — Z9181 History of falling: Secondary | ICD-10-CM | POA: Diagnosis not present

## 2015-06-15 NOTE — Telephone Encounter (Signed)
Received call from home health nurse stating she was currently in the home with the patient. Family thinks she has a UTI been having urine frequency and being confuse. Wanting to get order to for UA. MD is out of office today, but did ok to have UA done...Shari Mcmahon

## 2015-06-16 ENCOUNTER — Encounter (HOSPITAL_COMMUNITY): Payer: Self-pay | Admitting: Emergency Medicine

## 2015-06-16 ENCOUNTER — Emergency Department (HOSPITAL_COMMUNITY): Payer: Medicare Other

## 2015-06-16 ENCOUNTER — Emergency Department (HOSPITAL_COMMUNITY)
Admission: EM | Admit: 2015-06-16 | Discharge: 2015-06-16 | Disposition: A | Discharge status: Home / Self Care | Payer: Medicare Other | Attending: Emergency Medicine | Admitting: Emergency Medicine

## 2015-06-16 DIAGNOSIS — Z8742 Personal history of other diseases of the female genital tract: Secondary | ICD-10-CM | POA: Insufficient documentation

## 2015-06-16 DIAGNOSIS — E785 Hyperlipidemia, unspecified: Secondary | ICD-10-CM | POA: Diagnosis present

## 2015-06-16 DIAGNOSIS — E119 Type 2 diabetes mellitus without complications: Secondary | ICD-10-CM

## 2015-06-16 DIAGNOSIS — M1990 Unspecified osteoarthritis, unspecified site: Secondary | ICD-10-CM | POA: Diagnosis not present

## 2015-06-16 DIAGNOSIS — R300 Dysuria: Secondary | ICD-10-CM

## 2015-06-16 DIAGNOSIS — L89620 Pressure ulcer of left heel, unstageable: Secondary | ICD-10-CM | POA: Diagnosis not present

## 2015-06-16 DIAGNOSIS — Z87440 Personal history of urinary (tract) infections: Secondary | ICD-10-CM | POA: Diagnosis not present

## 2015-06-16 DIAGNOSIS — S72001D Fracture of unspecified part of neck of right femur, subsequent encounter for closed fracture with routine healing: Secondary | ICD-10-CM | POA: Diagnosis not present

## 2015-06-16 DIAGNOSIS — R531 Weakness: Secondary | ICD-10-CM

## 2015-06-16 DIAGNOSIS — J181 Lobar pneumonia, unspecified organism: Secondary | ICD-10-CM

## 2015-06-16 DIAGNOSIS — F329 Major depressive disorder, single episode, unspecified: Secondary | ICD-10-CM | POA: Insufficient documentation

## 2015-06-16 DIAGNOSIS — F4489 Other dissociative and conversion disorders: Secondary | ICD-10-CM | POA: Diagnosis not present

## 2015-06-16 DIAGNOSIS — R011 Cardiac murmur, unspecified: Secondary | ICD-10-CM | POA: Insufficient documentation

## 2015-06-16 DIAGNOSIS — I10 Essential (primary) hypertension: Secondary | ICD-10-CM | POA: Diagnosis not present

## 2015-06-16 DIAGNOSIS — Z85028 Personal history of other malignant neoplasm of stomach: Secondary | ICD-10-CM | POA: Insufficient documentation

## 2015-06-16 DIAGNOSIS — D649 Anemia, unspecified: Secondary | ICD-10-CM | POA: Diagnosis present

## 2015-06-16 DIAGNOSIS — L89610 Pressure ulcer of right heel, unstageable: Secondary | ICD-10-CM | POA: Diagnosis not present

## 2015-06-16 DIAGNOSIS — J188 Other pneumonia, unspecified organism: Secondary | ICD-10-CM | POA: Diagnosis not present

## 2015-06-16 DIAGNOSIS — J189 Pneumonia, unspecified organism: Secondary | ICD-10-CM | POA: Diagnosis not present

## 2015-06-16 DIAGNOSIS — G8929 Other chronic pain: Secondary | ICD-10-CM | POA: Diagnosis not present

## 2015-06-16 DIAGNOSIS — K5900 Constipation, unspecified: Secondary | ICD-10-CM | POA: Insufficient documentation

## 2015-06-16 DIAGNOSIS — R350 Frequency of micturition: Secondary | ICD-10-CM | POA: Diagnosis present

## 2015-06-16 DIAGNOSIS — I639 Cerebral infarction, unspecified: Secondary | ICD-10-CM | POA: Diagnosis not present

## 2015-06-16 DIAGNOSIS — I6789 Other cerebrovascular disease: Secondary | ICD-10-CM | POA: Diagnosis not present

## 2015-06-16 DIAGNOSIS — L89152 Pressure ulcer of sacral region, stage 2: Secondary | ICD-10-CM | POA: Diagnosis not present

## 2015-06-16 DIAGNOSIS — M109 Gout, unspecified: Secondary | ICD-10-CM | POA: Diagnosis not present

## 2015-06-16 DIAGNOSIS — Z880 Allergy status to penicillin: Secondary | ICD-10-CM | POA: Diagnosis not present

## 2015-06-16 DIAGNOSIS — Z79899 Other long term (current) drug therapy: Secondary | ICD-10-CM | POA: Diagnosis not present

## 2015-06-16 DIAGNOSIS — R410 Disorientation, unspecified: Secondary | ICD-10-CM | POA: Diagnosis not present

## 2015-06-16 DIAGNOSIS — M170 Bilateral primary osteoarthritis of knee: Secondary | ICD-10-CM | POA: Diagnosis not present

## 2015-06-16 DIAGNOSIS — H409 Unspecified glaucoma: Secondary | ICD-10-CM | POA: Diagnosis not present

## 2015-06-16 DIAGNOSIS — J159 Unspecified bacterial pneumonia: Secondary | ICD-10-CM | POA: Diagnosis not present

## 2015-06-16 DIAGNOSIS — M6281 Muscle weakness (generalized): Secondary | ICD-10-CM | POA: Diagnosis not present

## 2015-06-16 DIAGNOSIS — R918 Other nonspecific abnormal finding of lung field: Secondary | ICD-10-CM | POA: Diagnosis not present

## 2015-06-16 DIAGNOSIS — Z9181 History of falling: Secondary | ICD-10-CM | POA: Diagnosis not present

## 2015-06-16 DIAGNOSIS — F0390 Unspecified dementia without behavioral disturbance: Secondary | ICD-10-CM | POA: Diagnosis not present

## 2015-06-16 DIAGNOSIS — Z87891 Personal history of nicotine dependence: Secondary | ICD-10-CM | POA: Diagnosis not present

## 2015-06-16 LAB — URINALYSIS, ROUTINE W REFLEX MICROSCOPIC
Bilirubin Urine: NEGATIVE
Glucose, UA: NEGATIVE mg/dL
HGB URINE DIPSTICK: NEGATIVE
Ketones, ur: NEGATIVE mg/dL
LEUKOCYTES UA: NEGATIVE
NITRITE: NEGATIVE
Protein, ur: NEGATIVE mg/dL
SPECIFIC GRAVITY, URINE: 1.019 (ref 1.005–1.030)
UROBILINOGEN UA: 1 mg/dL (ref 0.0–1.0)
pH: 7 (ref 5.0–8.0)

## 2015-06-16 LAB — CBC WITH DIFFERENTIAL/PLATELET
BASOS ABS: 0 10*3/uL (ref 0.0–0.1)
Basophils Relative: 0 % (ref 0–1)
EOS ABS: 0.1 10*3/uL (ref 0.0–0.7)
EOS PCT: 1 % (ref 0–5)
HCT: 27.7 % — ABNORMAL LOW (ref 36.0–46.0)
Hemoglobin: 8.7 g/dL — ABNORMAL LOW (ref 12.0–15.0)
Lymphocytes Relative: 13 % (ref 12–46)
Lymphs Abs: 1.2 10*3/uL (ref 0.7–4.0)
MCH: 30.7 pg (ref 26.0–34.0)
MCHC: 31.4 g/dL (ref 30.0–36.0)
MCV: 97.9 fL (ref 78.0–100.0)
Monocytes Absolute: 0.8 10*3/uL (ref 0.1–1.0)
Monocytes Relative: 8 % (ref 3–12)
Neutro Abs: 7 10*3/uL (ref 1.7–7.7)
Neutrophils Relative %: 78 % — ABNORMAL HIGH (ref 43–77)
PLATELETS: 534 10*3/uL — AB (ref 150–400)
RBC: 2.83 MIL/uL — AB (ref 3.87–5.11)
RDW: 16.9 % — ABNORMAL HIGH (ref 11.5–15.5)
WBC: 9 10*3/uL (ref 4.0–10.5)

## 2015-06-16 LAB — COMPREHENSIVE METABOLIC PANEL
ALT: 9 U/L — AB (ref 14–54)
AST: 22 U/L (ref 15–41)
Albumin: 2.2 g/dL — ABNORMAL LOW (ref 3.5–5.0)
Alkaline Phosphatase: 79 U/L (ref 38–126)
Anion gap: 7 (ref 5–15)
BUN: 14 mg/dL (ref 6–20)
CHLORIDE: 105 mmol/L (ref 101–111)
CO2: 29 mmol/L (ref 22–32)
CREATININE: 0.9 mg/dL (ref 0.44–1.00)
Calcium: 9.1 mg/dL (ref 8.9–10.3)
GFR calc non Af Amer: 56 mL/min — ABNORMAL LOW (ref >60)
Glucose, Bld: 154 mg/dL — ABNORMAL HIGH (ref 65–99)
POTASSIUM: 3.7 mmol/L (ref 3.5–5.1)
SODIUM: 141 mmol/L (ref 135–145)
Total Bilirubin: 0.3 mg/dL (ref 0.3–1.2)
Total Protein: 6.7 g/dL (ref 6.5–8.1)

## 2015-06-16 MED ORDER — DOXYCYCLINE HYCLATE 100 MG PO CAPS: 100.0000 mg | ORAL_CAPSULE | Freq: Two times a day (BID) | ORAL | Status: DC

## 2015-06-16 MED ORDER — SULFAMETHOXAZOLE-TRIMETHOPRIM 800-160 MG PO TABS: 1.0000 | ORAL_TABLET | Freq: Two times a day (BID) | ORAL | Status: DC

## 2015-06-16 NOTE — ED Notes (Signed)
[facility] being Called. [place], [place]

## 2015-06-16 NOTE — ED Notes (Signed)
Pt back in room from xray. Family at bedside.

## 2015-06-16 NOTE — ED Notes (Signed)
Bed: FW26 Expected date:  Expected time:  Means of arrival:  Comments: EMS/[id number]/?UTI

## 2015-06-16 NOTE — ED Notes (Signed)
Pt is currently on Bedpan. Family and social work at the bedside.

## 2015-06-16 NOTE — Discharge Instructions (Signed)
CXR shows possible pneumonia today. Take antibiotics as prescribed. Return without fail for worsening symptoms, including confusion, difficulty breathing, fever, or any other symptoms concerning to you.

## 2015-06-16 NOTE — ED Notes (Signed)
MD at bedside. 

## 2015-06-16 NOTE — Care Management (Signed)
ED CM received call from [facility] ED  regarding SNF placement. CM will notify [facility] ED CSW regarding this request. Brittney contacted at [phone]. No further CM needs

## 2015-06-16 NOTE — Telephone Encounter (Signed)
Home health nurse called back and she couldn't get any urine from her. She looks a lot different today and she may be dehydrated. She does have wounds on both heals and sacrum area. Her right heal looks pretty bad.  Please call Jamilla at [phone]

## 2015-06-16 NOTE — Telephone Encounter (Signed)
Notified Jamilia with md response...Johny Chess

## 2015-06-16 NOTE — Progress Notes (Signed)
CSW met with patient at bedside. Daughter and son were present. Patient presents to [facility] due to urinary frequency and dysuria.   Patient lives with her granddaughter in [place]. Daughter informed CSW that the patient has been dehydrated and had foot issues. Also, daughter states that the patient broke her R hip in May, and has not been able to walk or bear weight since. Daughter states that the patient receives assistance with completing all ADL's. She states at this time family members take turn going to care for patient. However, she states that the patient needs 24 hour care and feels that she should be admitted to a facility.   Currently, the daughter states that the patient receives home health with [facility] and goes to the wound care clinic once a week. Patient informed CSW that her legs are always in constant pain.   Daughter appeared to be overwhelmed at bedside. CSW provided supportive counseling for daughter and patient. CSW also provided education regarding insurance criteria for long term care facilities. CSW encouraged the family to apply for medicaid. CSW spoke with EDP and asked her to add social work to home health care plan.   Daughter and patient are aware of discharge plan to return home. They understand and state that they do not have any questions at this time for CSW.   Daughter/Jackie [phone] Son/Don [phone] Granddaughter/Jocelyn [phone]  Willette Brace [phone] ED CSW 06/16/2015 11:04 PM

## 2015-06-16 NOTE — ED Provider Notes (Signed)
CSN: [id number]     Arrival date & time 06/16/15  1603 History   First MD Initiated Contact with Patient 06/16/15 1607     Chief Complaint  Patient presents with  . Urinary Frequency     (Consider location/radiation/quality/duration/timing/severity/associated sxs/prior Treatment) HPI 81 year old female who presents with dysuria. History of DM, Patient lives with her daughter, and was brought in today for generalized weakness and several days of urinary difficulty. Patient has had some decreased by mouth intake in the setting. Has not had any fevers, chills, nausea, vomiting, diarrhea, abdominal distention or pain. Son initially brought his mother to ED and states that she is at her mental status baseline. Later daughter reports that she has had progressive mental status decline, is more difficult to care for at home by herself and was concerning that she may need placement at nursing facility.    Past Medical History  Diagnosis Date  . ABDOMINAL PAIN, CHRONIC 04/07/2008  . ARTHRITIS 03/03/2008  . BRADYCARDIA, CHRONIC 12/17/2008  . CARDIAC MURMUR 02/01/2010  . CONSTIPATION, CHRONIC 10/15/2010  . DEGENERATIVE JOINT DISEASE, KNEES, BILATERAL 12/07/2007  . DEPRESSION 06/17/2009  . DIABETES MELLITUS, TYPE II 09/19/2007    DIET CONTROL   . DYSPHAGIA UNSPECIFIED 12/24/2009  . Gastroparesis 12/24/2009  . GENERALIZED OSTEOARTHROSIS INVOLVING HAND 10/03/2007  . GLAUCOMA 09/19/2007  . GOUTY ARTHROPATHY UNSPECIFIED 02/17/2009  . GOUT 09/19/2007  . HYPERLIPIDEMIA 01/27/2010  . HYPERTENSION 09/19/2007  . LUNG NODULE 03/03/2008  . MENOPAUSAL DISORDER 12/17/2008  . PERIPHERAL NEUROPATHY 06/19/2008  . PERSONAL HISTORY MALIGNANT NEOPLASM STOMACH 09/19/2007  . 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 STOMACH CANCER 03/03/2008  . UTI 12/15/2009  . Arthritis of knee, degenerative 10/19/2011   Past Surgical History  Procedure Laterality Date  . Vagotomy    . Partial gastrectomy    . Billroth ii  gastorenterostomy    . Colonoscopy     Family History  Problem Relation Age of Onset  . Uterine cancer Mother   . Stroke Father     Hemorrhagic  . Cancer Sister     breast cancer and one sister with uterine cancer and one sister with ovarian cancerr  . Colon cancer Maternal Grandmother   . Colon cancer Paternal Grandfather   . Diabetes Other   . Stomach cancer Neg Hx   . Prostate cancer Brother    Social History  Substance Use Topics  . Smoking status: Former Smoker    Types: Cigarettes  . Smokeless tobacco: Never Used     Comment: use to smoke a pack of cigaretts a day for 25 yrs. but stopped 10 yrs. ago.   . Alcohol Use: No   OB History    No data available     Review of Systems 10/14 systems reviewed and are negative other than those stated in the HPI   Allergies  Voltaren; Dulcolax; Duloxetine hcl; Penicillins; and Timolol  Home Medications   Prior to Admission medications   Medication Sig Start Date End Date Taking? Authorizing Provider  acetaminophen (TYLENOL) 500 MG tablet Take 500 mg by mouth every 6 (six) hours as needed for mild pain or moderate pain.    Yes Historical Provider, MD  allopurinol (ZYLOPRIM) 100 MG tablet Take 100 mg by mouth daily.   Yes Historical Provider, MD  glucose blood (FREESTYLE LITE) test strip Use as instructed 04/29/14  Yes Biagio Borg, MD  HYDROcodone-acetaminophen [facility]) 10-325 MG per tablet Take 1 tablet by mouth every 8 (eight) hours  as needed. Patient taking differently: Take 1 tablet by mouth every 8 (eight) hours as needed for moderate pain or severe pain.  05/19/15  Yes Biagio Borg, MD  latanoprost (XALATAN) 0.005 % ophthalmic solution Place 1 drop into both eyes at bedtime.  07/28/14  Yes Historical Provider, MD  Magnesium Hydroxide (MILK OF MAGNESIA PO) Take 15 mLs by mouth daily.   Yes Historical Provider, MD  mirtazapine (REMERON) 7.5 MG tablet Take 7.5 mg by mouth at bedtime.   Yes Historical Provider, MD  omeprazole  (PRILOSEC) 40 MG capsule Take 1 capsule (40 mg total) by mouth daily. 08/15/14  Yes Milus Banister, MD  pravastatin (PRAVACHOL) 10 MG tablet TAKE 1 TABLET BY MOUTH EVERY EVENING. Patient taking differently: TAKE 10 MG BY MOUTH EVERY EVENING 05/20/15  Yes Biagio Borg, MD  PRESCRIPTION MEDICATION Skin prep. Apply to blisters on right and left heels twice daily   Yes Historical Provider, MD  senna (SENOKOT) 8.6 MG tablet Take 1 tablet by mouth 2 (two) times daily.   Yes Historical Provider, MD  doxycycline (VIBRAMYCIN) 100 MG capsule Take 1 capsule (100 mg total) by mouth 2 (two) times daily. 06/16/15   Forde Dandy, MD  oxyCODONE-acetaminophen (PERCOCET) 5-325 MG per tablet Take 1 tablet by mouth every 4 (four) hours as needed. Patient not taking: Reported on 06/16/2015 05/08/15   Orpah Greek, MD  predniSONE (DELTASONE) 10 MG tablet Take 1 tablet (10 mg total) by mouth daily. 3 tabs by mouth per day for 3 days, then 2 tabs per day for 3 days, then 1 tab per day for 3 days, then stop Patient not taking: Reported on 06/16/2015 05/19/15   Biagio Borg, MD   BP 129/55 mmHg  Pulse 100  Temp(Src) 98.3 F (36.8 C) (Oral)  Resp 18  Ht 5\' 6"  (1.676 m)  Wt 162 lb (73.483 kg)  BMI 26.16 kg/m2  SpO2 96% Physical Exam Physical Exam  Nursing note and vitals reviewed. Constitutional: Well developed, well nourished, non-toxic, and in no acute distress Head: Normocephalic and atraumatic.  Mouth/Throat: Oropharynx is clear and moist.  Neck: Normal range of motion. Neck supple.  Cardiovascular: Normal rate and regular rhythm.   Pulmonary/Chest: Effort normal and breath sounds normal.  Abdominal: Soft. There is no tenderness. There is no rebound and no guarding.  Musculoskeletal: Normal range of motion.  Neurological: Alert, no facial droop, fluent speech, moves all extremities symmetrically Skin: Quarter sized decubitus ulcer extending into subcutaneous tissue without drainage or surrounding  erythema/induration/fluctuance. There is quarter sized wounds noted to bilateral heels without active drainage surrounding erythema, or drainage or induration Psychiatric: Cooperative  ED Course  Procedures (including critical care time) Labs Review Labs Reviewed  CBC WITH DIFFERENTIAL/PLATELET - Abnormal; Notable for the following:    RBC 2.83 (*)    Hemoglobin 8.7 (*)    HCT 27.7 (*)    RDW 16.9 (*)    Platelets 534 (*)    Neutrophils Relative % 78 (*)    All other components within normal limits  COMPREHENSIVE METABOLIC PANEL - Abnormal; Notable for the following:    Glucose, Bld 154 (*)    Albumin 2.2 (*)    ALT 9 (*)    GFR calc non Af Amer 56 (*)    All other components within normal limits  URINALYSIS, ROUTINE W REFLEX MICROSCOPIC (NOT AT [facility]) - Abnormal; Notable for the following:    APPearance CLOUDY (*)    All other  components within normal limits  URINE CULTURE    Imaging Review Dg Chest 2 View  06/16/2015   CLINICAL DATA:  Confusion. Dementia. Urinary frequency and dysuria. Generalized weakness.  EXAM: CHEST  2 VIEW  COMPARISON:  10/30/2014 ; 12/25/2014  FINDINGS: Airspace opacity obscures the left hemidiaphragm.  Tortuous and densely atherosclerotic thoracic aorta especially involving the ascending aorta and arch.  Borderline cardiomegaly.  Mild thoracic spondylosis. Based on the lateral projection a left pleural effusion is not entirely excluded, but no overt blunting of the left lateral costophrenic angle is observed on the semi upright view.  IMPRESSION: 1. Airspace opacity in the left lower lobe, suspicious for pneumonia. An associated left pleural effusion is not excluded. 2. Atherosclerosis. 3. Borderline cardiomegaly.   Electronically Signed   By: Van Clines M.D.   On: 06/16/2015 20:41   I have personally reviewed and evaluated these images and lab results as part of my medical decision-making.   MDM   Final diagnoses:  Dysuria  Weakness  Community  acquired pneumonia    81 year old female who presents with dysuria. She is non-toxic and in no acute distress. She is AF with stable VS. She has unremarkable and non-focal exam. UA does not show infection but is sent for culture. XR shows possible LLL infiltrate, though patient without cough, sob. However, patient's daughter does report generalized weakness progressively worsening. Thus, will treat for possible CAP. Bilateral LE wounds over heels and decubitus appear clean without surrounding cellulitis or drainage. No clear indication for admission today. Social work is present today to help patient with placement from outpatient standpoint. Currently has home health nursing, but awaiting medicaid to come through for placement.Strict return and follow-up instructions reviewed. She expressed understanding of all discharge instructions and felt comfortable with the plan of care.      Forde Dandy, MD 06/17/15 [phone]

## 2015-06-16 NOTE — ED Notes (Addendum)
Pt arrived via EMS with report of poor po intake. Pt reported burning/pain with voiding, lower abd pain, urinary frequency/urgency, and dk amber urine. Pt was taken home from [facility] 3 weeks ago, bil heels being monitor via wound clinic and home health nurse. Pt has Stg II to coccyx area. EMS reported that pt was advise via family to report that she was unsafe at home so she can get placement again.

## 2015-06-16 NOTE — Telephone Encounter (Signed)
Suggest go to ER now for decreased UOP, urine odor, and confusion,

## 2015-06-16 NOTE — Telephone Encounter (Signed)
See note below

## 2015-06-17 ENCOUNTER — Telehealth: Payer: Self-pay | Admitting: Internal Medicine

## 2015-06-17 ENCOUNTER — Other Ambulatory Visit: Payer: Self-pay | Admitting: Internal Medicine

## 2015-06-17 DIAGNOSIS — L89613 Pressure ulcer of right heel, stage 3: Secondary | ICD-10-CM | POA: Diagnosis not present

## 2015-06-17 DIAGNOSIS — L89622 Pressure ulcer of left heel, stage 2: Secondary | ICD-10-CM | POA: Diagnosis not present

## 2015-06-17 DIAGNOSIS — E1140 Type 2 diabetes mellitus with diabetic neuropathy, unspecified: Secondary | ICD-10-CM | POA: Diagnosis not present

## 2015-06-17 DIAGNOSIS — M1990 Unspecified osteoarthritis, unspecified site: Secondary | ICD-10-CM | POA: Diagnosis not present

## 2015-06-17 DIAGNOSIS — E11621 Type 2 diabetes mellitus with foot ulcer: Secondary | ICD-10-CM | POA: Diagnosis not present

## 2015-06-17 DIAGNOSIS — L89153 Pressure ulcer of sacral region, stage 3: Secondary | ICD-10-CM | POA: Diagnosis not present

## 2015-06-17 DIAGNOSIS — I10 Essential (primary) hypertension: Secondary | ICD-10-CM | POA: Diagnosis not present

## 2015-06-17 MED ORDER — DIVALPROEX SODIUM 125 MG PO CSDR: 125.0000 mg | DELAYED_RELEASE_CAPSULE | Freq: Two times a day (BID) | ORAL | Status: DC

## 2015-06-17 NOTE — Telephone Encounter (Signed)
Pt's grand daughter advised. Rx for Depakote sent

## 2015-06-17 NOTE — Telephone Encounter (Signed)
I cant see any record of the hospital giving this, and I would not know qd vs bid  Was pt in a different hospital system?

## 2015-06-17 NOTE — Telephone Encounter (Signed)
If not eating for 4 days , I suspect she may not be drinking well, and may need to go to ER to see regarding dehydration.  There is no need for an order to go to ER, maybe she means an order for Home Health for Disempaction?  But still if not eating or drinking well for 4 days, needs ER especially if weak, dizzy, falls, confused or other unusual symptoms  I could do the depakote then but was it once or twice per day?

## 2015-06-17 NOTE — Telephone Encounter (Signed)
Pt informed, Rx in cabinet for pt pick up  

## 2015-06-17 NOTE — Telephone Encounter (Signed)
Patient daughter is calling to request a fill for divalproex (DEPAKOTE SPRINKLE) 125 MG capsule. We have never prescribed this before but the hospital had her on it. She is experiencing confusion. [facility] is the right pharmacy

## 2015-06-17 NOTE — Telephone Encounter (Signed)
Pt grand daughter Suzie Portela called concerned that patient has been refusing to eat for x 4 days and hasn't had a BM since 06/11/2015.  Granddaughter believed pt has a fecal impaction even with taking daily Senakot and MOM. [place] daughter was advised that pt should go to ER but she states that the ER told her that Dr Jenny Reichmann needs to send order for them to evaluate pt? I advised that this is incorrect. Please advise  Grand daughter also says that pt is taking Depakote QD and received Rx from [facility]

## 2015-06-17 NOTE — Telephone Encounter (Signed)
Done hardcopy to Dahlia  

## 2015-06-18 DIAGNOSIS — L89620 Pressure ulcer of left heel, unstageable: Secondary | ICD-10-CM | POA: Diagnosis not present

## 2015-06-18 DIAGNOSIS — H409 Unspecified glaucoma: Secondary | ICD-10-CM | POA: Diagnosis not present

## 2015-06-18 DIAGNOSIS — L89610 Pressure ulcer of right heel, unstageable: Secondary | ICD-10-CM | POA: Diagnosis not present

## 2015-06-18 DIAGNOSIS — E119 Type 2 diabetes mellitus without complications: Secondary | ICD-10-CM | POA: Diagnosis not present

## 2015-06-18 DIAGNOSIS — M109 Gout, unspecified: Secondary | ICD-10-CM | POA: Diagnosis not present

## 2015-06-18 DIAGNOSIS — S72001D Fracture of unspecified part of neck of right femur, subsequent encounter for closed fracture with routine healing: Secondary | ICD-10-CM | POA: Diagnosis not present

## 2015-06-18 DIAGNOSIS — Z9181 History of falling: Secondary | ICD-10-CM | POA: Diagnosis not present

## 2015-06-18 DIAGNOSIS — M170 Bilateral primary osteoarthritis of knee: Secondary | ICD-10-CM | POA: Diagnosis not present

## 2015-06-18 DIAGNOSIS — F0390 Unspecified dementia without behavioral disturbance: Secondary | ICD-10-CM | POA: Diagnosis not present

## 2015-06-18 DIAGNOSIS — L89152 Pressure ulcer of sacral region, stage 2: Secondary | ICD-10-CM | POA: Diagnosis not present

## 2015-06-18 LAB — URINE CULTURE

## 2015-06-18 NOTE — Telephone Encounter (Signed)
Samella Parr, the home health nurse called back in requesting order to see patient for constipation.  Spoke with Dr. Jenny Reichmann and he did say that would be ok.  She is also requesting an order for Social Work to step in as well.  Can you please send order and follow up with Jamilla at [phone].

## 2015-06-18 NOTE — Telephone Encounter (Signed)
HHRN given verbal authorization

## 2015-06-19 DIAGNOSIS — L89610 Pressure ulcer of right heel, unstageable: Secondary | ICD-10-CM | POA: Diagnosis not present

## 2015-06-19 DIAGNOSIS — M109 Gout, unspecified: Secondary | ICD-10-CM | POA: Diagnosis not present

## 2015-06-19 DIAGNOSIS — Z9181 History of falling: Secondary | ICD-10-CM | POA: Diagnosis not present

## 2015-06-19 DIAGNOSIS — H409 Unspecified glaucoma: Secondary | ICD-10-CM | POA: Diagnosis not present

## 2015-06-19 DIAGNOSIS — E119 Type 2 diabetes mellitus without complications: Secondary | ICD-10-CM | POA: Diagnosis not present

## 2015-06-19 DIAGNOSIS — L89152 Pressure ulcer of sacral region, stage 2: Secondary | ICD-10-CM | POA: Diagnosis not present

## 2015-06-19 DIAGNOSIS — M170 Bilateral primary osteoarthritis of knee: Secondary | ICD-10-CM | POA: Diagnosis not present

## 2015-06-19 DIAGNOSIS — F0390 Unspecified dementia without behavioral disturbance: Secondary | ICD-10-CM | POA: Diagnosis not present

## 2015-06-19 DIAGNOSIS — L89620 Pressure ulcer of left heel, unstageable: Secondary | ICD-10-CM | POA: Diagnosis not present

## 2015-06-19 DIAGNOSIS — S72001D Fracture of unspecified part of neck of right femur, subsequent encounter for closed fracture with routine healing: Secondary | ICD-10-CM | POA: Diagnosis not present

## 2015-06-22 DIAGNOSIS — L89152 Pressure ulcer of sacral region, stage 2: Secondary | ICD-10-CM | POA: Diagnosis not present

## 2015-06-22 DIAGNOSIS — H409 Unspecified glaucoma: Secondary | ICD-10-CM | POA: Diagnosis not present

## 2015-06-22 DIAGNOSIS — L89610 Pressure ulcer of right heel, unstageable: Secondary | ICD-10-CM | POA: Diagnosis not present

## 2015-06-22 DIAGNOSIS — M170 Bilateral primary osteoarthritis of knee: Secondary | ICD-10-CM | POA: Diagnosis not present

## 2015-06-22 DIAGNOSIS — Z9181 History of falling: Secondary | ICD-10-CM | POA: Diagnosis not present

## 2015-06-22 DIAGNOSIS — F0390 Unspecified dementia without behavioral disturbance: Secondary | ICD-10-CM | POA: Diagnosis not present

## 2015-06-22 DIAGNOSIS — L89620 Pressure ulcer of left heel, unstageable: Secondary | ICD-10-CM | POA: Diagnosis not present

## 2015-06-22 DIAGNOSIS — E119 Type 2 diabetes mellitus without complications: Secondary | ICD-10-CM | POA: Diagnosis not present

## 2015-06-22 DIAGNOSIS — S72001D Fracture of unspecified part of neck of right femur, subsequent encounter for closed fracture with routine healing: Secondary | ICD-10-CM | POA: Diagnosis not present

## 2015-06-22 DIAGNOSIS — M109 Gout, unspecified: Secondary | ICD-10-CM | POA: Diagnosis not present

## 2015-06-23 ENCOUNTER — Other Ambulatory Visit: Payer: Self-pay | Admitting: Internal Medicine

## 2015-06-23 ENCOUNTER — Encounter: Payer: Self-pay | Admitting: Internal Medicine

## 2015-06-23 DIAGNOSIS — H409 Unspecified glaucoma: Secondary | ICD-10-CM | POA: Diagnosis not present

## 2015-06-23 DIAGNOSIS — E119 Type 2 diabetes mellitus without complications: Secondary | ICD-10-CM | POA: Diagnosis not present

## 2015-06-23 DIAGNOSIS — S72001D Fracture of unspecified part of neck of right femur, subsequent encounter for closed fracture with routine healing: Secondary | ICD-10-CM | POA: Diagnosis not present

## 2015-06-23 DIAGNOSIS — Z9181 History of falling: Secondary | ICD-10-CM | POA: Diagnosis not present

## 2015-06-23 DIAGNOSIS — M170 Bilateral primary osteoarthritis of knee: Secondary | ICD-10-CM | POA: Diagnosis not present

## 2015-06-23 DIAGNOSIS — F0390 Unspecified dementia without behavioral disturbance: Secondary | ICD-10-CM | POA: Diagnosis not present

## 2015-06-23 DIAGNOSIS — L89610 Pressure ulcer of right heel, unstageable: Secondary | ICD-10-CM | POA: Diagnosis not present

## 2015-06-23 DIAGNOSIS — L89152 Pressure ulcer of sacral region, stage 2: Secondary | ICD-10-CM | POA: Diagnosis not present

## 2015-06-23 DIAGNOSIS — L89620 Pressure ulcer of left heel, unstageable: Secondary | ICD-10-CM | POA: Diagnosis not present

## 2015-06-23 DIAGNOSIS — M109 Gout, unspecified: Secondary | ICD-10-CM | POA: Diagnosis not present

## 2015-06-24 ENCOUNTER — Telehealth: Payer: Self-pay | Admitting: Internal Medicine

## 2015-06-24 NOTE — Telephone Encounter (Signed)
Patient daughter would like to talk to you concerning her mother, about a form that need  to be sent out, please advise

## 2015-06-25 DIAGNOSIS — E119 Type 2 diabetes mellitus without complications: Secondary | ICD-10-CM | POA: Diagnosis not present

## 2015-06-25 DIAGNOSIS — I10 Essential (primary) hypertension: Secondary | ICD-10-CM | POA: Diagnosis not present

## 2015-06-25 DIAGNOSIS — L89152 Pressure ulcer of sacral region, stage 2: Secondary | ICD-10-CM | POA: Diagnosis not present

## 2015-06-25 DIAGNOSIS — Z9181 History of falling: Secondary | ICD-10-CM | POA: Diagnosis not present

## 2015-06-25 DIAGNOSIS — E11621 Type 2 diabetes mellitus with foot ulcer: Secondary | ICD-10-CM | POA: Diagnosis not present

## 2015-06-25 DIAGNOSIS — M1990 Unspecified osteoarthritis, unspecified site: Secondary | ICD-10-CM | POA: Diagnosis not present

## 2015-06-25 DIAGNOSIS — L89622 Pressure ulcer of left heel, stage 2: Secondary | ICD-10-CM | POA: Diagnosis not present

## 2015-06-25 DIAGNOSIS — M170 Bilateral primary osteoarthritis of knee: Secondary | ICD-10-CM | POA: Diagnosis not present

## 2015-06-25 DIAGNOSIS — F0390 Unspecified dementia without behavioral disturbance: Secondary | ICD-10-CM | POA: Diagnosis not present

## 2015-06-25 DIAGNOSIS — L89610 Pressure ulcer of right heel, unstageable: Secondary | ICD-10-CM | POA: Diagnosis not present

## 2015-06-25 DIAGNOSIS — H409 Unspecified glaucoma: Secondary | ICD-10-CM | POA: Diagnosis not present

## 2015-06-25 DIAGNOSIS — L89620 Pressure ulcer of left heel, unstageable: Secondary | ICD-10-CM | POA: Diagnosis not present

## 2015-06-25 DIAGNOSIS — S72001D Fracture of unspecified part of neck of right femur, subsequent encounter for closed fracture with routine healing: Secondary | ICD-10-CM | POA: Diagnosis not present

## 2015-06-25 DIAGNOSIS — L89153 Pressure ulcer of sacral region, stage 3: Secondary | ICD-10-CM | POA: Diagnosis not present

## 2015-06-25 DIAGNOSIS — L89613 Pressure ulcer of right heel, stage 3: Secondary | ICD-10-CM | POA: Diagnosis not present

## 2015-06-25 DIAGNOSIS — E1140 Type 2 diabetes mellitus with diabetic neuropathy, unspecified: Secondary | ICD-10-CM | POA: Diagnosis not present

## 2015-06-25 DIAGNOSIS — M109 Gout, unspecified: Secondary | ICD-10-CM | POA: Diagnosis not present

## 2015-06-26 DIAGNOSIS — L89610 Pressure ulcer of right heel, unstageable: Secondary | ICD-10-CM | POA: Diagnosis not present

## 2015-06-26 DIAGNOSIS — H409 Unspecified glaucoma: Secondary | ICD-10-CM | POA: Diagnosis not present

## 2015-06-26 DIAGNOSIS — M109 Gout, unspecified: Secondary | ICD-10-CM | POA: Diagnosis not present

## 2015-06-26 DIAGNOSIS — L89152 Pressure ulcer of sacral region, stage 2: Secondary | ICD-10-CM | POA: Diagnosis not present

## 2015-06-26 DIAGNOSIS — M170 Bilateral primary osteoarthritis of knee: Secondary | ICD-10-CM | POA: Diagnosis not present

## 2015-06-26 DIAGNOSIS — S72001D Fracture of unspecified part of neck of right femur, subsequent encounter for closed fracture with routine healing: Secondary | ICD-10-CM | POA: Diagnosis not present

## 2015-06-26 DIAGNOSIS — E119 Type 2 diabetes mellitus without complications: Secondary | ICD-10-CM | POA: Diagnosis not present

## 2015-06-26 DIAGNOSIS — L89620 Pressure ulcer of left heel, unstageable: Secondary | ICD-10-CM | POA: Diagnosis not present

## 2015-06-26 DIAGNOSIS — F0390 Unspecified dementia without behavioral disturbance: Secondary | ICD-10-CM | POA: Diagnosis not present

## 2015-06-26 DIAGNOSIS — Z9181 History of falling: Secondary | ICD-10-CM | POA: Diagnosis not present

## 2015-06-30 DIAGNOSIS — S72001D Fracture of unspecified part of neck of right femur, subsequent encounter for closed fracture with routine healing: Secondary | ICD-10-CM | POA: Diagnosis not present

## 2015-06-30 DIAGNOSIS — Z9181 History of falling: Secondary | ICD-10-CM | POA: Diagnosis not present

## 2015-06-30 DIAGNOSIS — H409 Unspecified glaucoma: Secondary | ICD-10-CM | POA: Diagnosis not present

## 2015-06-30 DIAGNOSIS — L89620 Pressure ulcer of left heel, unstageable: Secondary | ICD-10-CM | POA: Diagnosis not present

## 2015-06-30 DIAGNOSIS — F0390 Unspecified dementia without behavioral disturbance: Secondary | ICD-10-CM | POA: Diagnosis not present

## 2015-06-30 DIAGNOSIS — L89610 Pressure ulcer of right heel, unstageable: Secondary | ICD-10-CM | POA: Diagnosis not present

## 2015-06-30 DIAGNOSIS — M170 Bilateral primary osteoarthritis of knee: Secondary | ICD-10-CM | POA: Diagnosis not present

## 2015-06-30 DIAGNOSIS — M109 Gout, unspecified: Secondary | ICD-10-CM | POA: Diagnosis not present

## 2015-06-30 DIAGNOSIS — E119 Type 2 diabetes mellitus without complications: Secondary | ICD-10-CM | POA: Diagnosis not present

## 2015-06-30 DIAGNOSIS — L89152 Pressure ulcer of sacral region, stage 2: Secondary | ICD-10-CM | POA: Diagnosis not present

## 2015-07-01 DIAGNOSIS — N390 Urinary tract infection, site not specified: Secondary | ICD-10-CM | POA: Diagnosis not present

## 2015-07-01 DIAGNOSIS — Z0279 Encounter for issue of other medical certificate: Secondary | ICD-10-CM

## 2015-07-02 DIAGNOSIS — L89152 Pressure ulcer of sacral region, stage 2: Secondary | ICD-10-CM | POA: Diagnosis not present

## 2015-07-02 DIAGNOSIS — E119 Type 2 diabetes mellitus without complications: Secondary | ICD-10-CM | POA: Diagnosis not present

## 2015-07-02 DIAGNOSIS — M170 Bilateral primary osteoarthritis of knee: Secondary | ICD-10-CM | POA: Diagnosis not present

## 2015-07-02 DIAGNOSIS — S72001D Fracture of unspecified part of neck of right femur, subsequent encounter for closed fracture with routine healing: Secondary | ICD-10-CM | POA: Diagnosis not present

## 2015-07-02 DIAGNOSIS — M109 Gout, unspecified: Secondary | ICD-10-CM | POA: Diagnosis not present

## 2015-07-02 DIAGNOSIS — F0390 Unspecified dementia without behavioral disturbance: Secondary | ICD-10-CM | POA: Diagnosis not present

## 2015-07-02 DIAGNOSIS — H409 Unspecified glaucoma: Secondary | ICD-10-CM | POA: Diagnosis not present

## 2015-07-02 DIAGNOSIS — L89620 Pressure ulcer of left heel, unstageable: Secondary | ICD-10-CM | POA: Diagnosis not present

## 2015-07-02 DIAGNOSIS — Z9181 History of falling: Secondary | ICD-10-CM | POA: Diagnosis not present

## 2015-07-02 DIAGNOSIS — L89610 Pressure ulcer of right heel, unstageable: Secondary | ICD-10-CM | POA: Diagnosis not present

## 2015-07-03 DIAGNOSIS — S72001D Fracture of unspecified part of neck of right femur, subsequent encounter for closed fracture with routine healing: Secondary | ICD-10-CM | POA: Diagnosis not present

## 2015-07-03 DIAGNOSIS — E119 Type 2 diabetes mellitus without complications: Secondary | ICD-10-CM | POA: Diagnosis not present

## 2015-07-03 DIAGNOSIS — L89620 Pressure ulcer of left heel, unstageable: Secondary | ICD-10-CM | POA: Diagnosis not present

## 2015-07-03 DIAGNOSIS — L89610 Pressure ulcer of right heel, unstageable: Secondary | ICD-10-CM | POA: Diagnosis not present

## 2015-07-03 DIAGNOSIS — M170 Bilateral primary osteoarthritis of knee: Secondary | ICD-10-CM | POA: Diagnosis not present

## 2015-07-03 DIAGNOSIS — F0390 Unspecified dementia without behavioral disturbance: Secondary | ICD-10-CM | POA: Diagnosis not present

## 2015-07-03 DIAGNOSIS — H409 Unspecified glaucoma: Secondary | ICD-10-CM | POA: Diagnosis not present

## 2015-07-03 DIAGNOSIS — Z9181 History of falling: Secondary | ICD-10-CM | POA: Diagnosis not present

## 2015-07-03 DIAGNOSIS — M109 Gout, unspecified: Secondary | ICD-10-CM | POA: Diagnosis not present

## 2015-07-03 DIAGNOSIS — L89152 Pressure ulcer of sacral region, stage 2: Secondary | ICD-10-CM | POA: Diagnosis not present

## 2015-07-07 DIAGNOSIS — Z9181 History of falling: Secondary | ICD-10-CM | POA: Diagnosis not present

## 2015-07-07 DIAGNOSIS — M109 Gout, unspecified: Secondary | ICD-10-CM | POA: Diagnosis not present

## 2015-07-07 DIAGNOSIS — S72001D Fracture of unspecified part of neck of right femur, subsequent encounter for closed fracture with routine healing: Secondary | ICD-10-CM | POA: Diagnosis not present

## 2015-07-07 NOTE — Telephone Encounter (Signed)
Mailed copy to pt home.

## 2015-07-07 NOTE — Telephone Encounter (Signed)
Form is filled out and faxed.   Pt dtr indicated that the facility that the pt should be sent to is a SNF due to the assistance that the pt needs.

## 2015-07-08 ENCOUNTER — Telehealth: Payer: Self-pay | Admitting: Internal Medicine

## 2015-07-08 DIAGNOSIS — L89612 Pressure ulcer of right heel, stage 2: Secondary | ICD-10-CM | POA: Diagnosis not present

## 2015-07-08 DIAGNOSIS — G309 Alzheimer's disease, unspecified: Secondary | ICD-10-CM | POA: Diagnosis not present

## 2015-07-08 DIAGNOSIS — N189 Chronic kidney disease, unspecified: Secondary | ICD-10-CM | POA: Diagnosis not present

## 2015-07-08 DIAGNOSIS — S72001D Fracture of unspecified part of neck of right femur, subsequent encounter for closed fracture with routine healing: Secondary | ICD-10-CM | POA: Diagnosis not present

## 2015-07-08 DIAGNOSIS — F0280 Dementia in other diseases classified elsewhere without behavioral disturbance: Secondary | ICD-10-CM | POA: Diagnosis not present

## 2015-07-08 DIAGNOSIS — L89152 Pressure ulcer of sacral region, stage 2: Secondary | ICD-10-CM | POA: Diagnosis not present

## 2015-07-08 DIAGNOSIS — M109 Gout, unspecified: Secondary | ICD-10-CM | POA: Diagnosis not present

## 2015-07-08 DIAGNOSIS — L89622 Pressure ulcer of left heel, stage 2: Secondary | ICD-10-CM | POA: Diagnosis not present

## 2015-07-08 DIAGNOSIS — E1122 Type 2 diabetes mellitus with diabetic chronic kidney disease: Secondary | ICD-10-CM | POA: Diagnosis not present

## 2015-07-08 DIAGNOSIS — M170 Bilateral primary osteoarthritis of knee: Secondary | ICD-10-CM | POA: Diagnosis not present

## 2015-07-08 NOTE — Telephone Encounter (Signed)
Catalina Lunger from [facility] called and is requesting the [facility] that Dr. Jenny Reichmann has signed for pt to be faxed to her so she can get pt placed. Catalina Lunger phone [phone] Fax # [phone]

## 2015-07-09 ENCOUNTER — Encounter (HOSPITAL_BASED_OUTPATIENT_CLINIC_OR_DEPARTMENT_OTHER): Payer: Medicare Other | Attending: Internal Medicine

## 2015-07-09 DIAGNOSIS — Z9221 Personal history of antineoplastic chemotherapy: Secondary | ICD-10-CM | POA: Diagnosis not present

## 2015-07-09 DIAGNOSIS — H409 Unspecified glaucoma: Secondary | ICD-10-CM | POA: Insufficient documentation

## 2015-07-09 DIAGNOSIS — Z923 Personal history of irradiation: Secondary | ICD-10-CM | POA: Insufficient documentation

## 2015-07-09 DIAGNOSIS — E11621 Type 2 diabetes mellitus with foot ulcer: Secondary | ICD-10-CM | POA: Diagnosis not present

## 2015-07-09 DIAGNOSIS — I10 Essential (primary) hypertension: Secondary | ICD-10-CM | POA: Diagnosis not present

## 2015-07-09 DIAGNOSIS — M1990 Unspecified osteoarthritis, unspecified site: Secondary | ICD-10-CM | POA: Insufficient documentation

## 2015-07-09 DIAGNOSIS — Z872 Personal history of diseases of the skin and subcutaneous tissue: Secondary | ICD-10-CM | POA: Insufficient documentation

## 2015-07-09 DIAGNOSIS — M109 Gout, unspecified: Secondary | ICD-10-CM | POA: Insufficient documentation

## 2015-07-09 DIAGNOSIS — E1140 Type 2 diabetes mellitus with diabetic neuropathy, unspecified: Secondary | ICD-10-CM | POA: Insufficient documentation

## 2015-07-09 DIAGNOSIS — L89312 Pressure ulcer of right buttock, stage 2: Secondary | ICD-10-CM | POA: Diagnosis not present

## 2015-07-09 DIAGNOSIS — L89613 Pressure ulcer of right heel, stage 3: Secondary | ICD-10-CM | POA: Insufficient documentation

## 2015-07-09 NOTE — Telephone Encounter (Signed)
Spoke to [employer] who is in possession of paperwork. She is going to refax

## 2015-07-10 DIAGNOSIS — L89152 Pressure ulcer of sacral region, stage 2: Secondary | ICD-10-CM | POA: Diagnosis not present

## 2015-07-10 DIAGNOSIS — M170 Bilateral primary osteoarthritis of knee: Secondary | ICD-10-CM | POA: Diagnosis not present

## 2015-07-10 DIAGNOSIS — L89622 Pressure ulcer of left heel, stage 2: Secondary | ICD-10-CM | POA: Diagnosis not present

## 2015-07-10 DIAGNOSIS — G309 Alzheimer's disease, unspecified: Secondary | ICD-10-CM | POA: Diagnosis not present

## 2015-07-10 DIAGNOSIS — E1122 Type 2 diabetes mellitus with diabetic chronic kidney disease: Secondary | ICD-10-CM | POA: Diagnosis not present

## 2015-07-10 DIAGNOSIS — S72001D Fracture of unspecified part of neck of right femur, subsequent encounter for closed fracture with routine healing: Secondary | ICD-10-CM | POA: Diagnosis not present

## 2015-07-10 DIAGNOSIS — M109 Gout, unspecified: Secondary | ICD-10-CM | POA: Diagnosis not present

## 2015-07-10 DIAGNOSIS — N189 Chronic kidney disease, unspecified: Secondary | ICD-10-CM | POA: Diagnosis not present

## 2015-07-10 DIAGNOSIS — L89612 Pressure ulcer of right heel, stage 2: Secondary | ICD-10-CM | POA: Diagnosis not present

## 2015-07-10 DIAGNOSIS — F0280 Dementia in other diseases classified elsewhere without behavioral disturbance: Secondary | ICD-10-CM | POA: Diagnosis not present

## 2015-07-13 DIAGNOSIS — F0280 Dementia in other diseases classified elsewhere without behavioral disturbance: Secondary | ICD-10-CM | POA: Diagnosis not present

## 2015-07-13 DIAGNOSIS — L89622 Pressure ulcer of left heel, stage 2: Secondary | ICD-10-CM | POA: Diagnosis not present

## 2015-07-13 DIAGNOSIS — M170 Bilateral primary osteoarthritis of knee: Secondary | ICD-10-CM | POA: Diagnosis not present

## 2015-07-13 DIAGNOSIS — L89152 Pressure ulcer of sacral region, stage 2: Secondary | ICD-10-CM | POA: Diagnosis not present

## 2015-07-13 DIAGNOSIS — G309 Alzheimer's disease, unspecified: Secondary | ICD-10-CM | POA: Diagnosis not present

## 2015-07-13 DIAGNOSIS — N189 Chronic kidney disease, unspecified: Secondary | ICD-10-CM | POA: Diagnosis not present

## 2015-07-13 DIAGNOSIS — E1122 Type 2 diabetes mellitus with diabetic chronic kidney disease: Secondary | ICD-10-CM | POA: Diagnosis not present

## 2015-07-13 DIAGNOSIS — L89612 Pressure ulcer of right heel, stage 2: Secondary | ICD-10-CM | POA: Diagnosis not present

## 2015-07-13 DIAGNOSIS — S72001D Fracture of unspecified part of neck of right femur, subsequent encounter for closed fracture with routine healing: Secondary | ICD-10-CM | POA: Diagnosis not present

## 2015-07-13 DIAGNOSIS — M109 Gout, unspecified: Secondary | ICD-10-CM | POA: Diagnosis not present

## 2015-07-14 ENCOUNTER — Telehealth: Payer: Self-pay | Admitting: Internal Medicine

## 2015-07-14 DIAGNOSIS — L89622 Pressure ulcer of left heel, stage 2: Secondary | ICD-10-CM | POA: Diagnosis not present

## 2015-07-14 DIAGNOSIS — L89152 Pressure ulcer of sacral region, stage 2: Secondary | ICD-10-CM | POA: Diagnosis not present

## 2015-07-14 DIAGNOSIS — G309 Alzheimer's disease, unspecified: Secondary | ICD-10-CM | POA: Diagnosis not present

## 2015-07-14 DIAGNOSIS — N189 Chronic kidney disease, unspecified: Secondary | ICD-10-CM | POA: Diagnosis not present

## 2015-07-14 DIAGNOSIS — F0280 Dementia in other diseases classified elsewhere without behavioral disturbance: Secondary | ICD-10-CM | POA: Diagnosis not present

## 2015-07-14 DIAGNOSIS — L89612 Pressure ulcer of right heel, stage 2: Secondary | ICD-10-CM | POA: Diagnosis not present

## 2015-07-14 DIAGNOSIS — M170 Bilateral primary osteoarthritis of knee: Secondary | ICD-10-CM | POA: Diagnosis not present

## 2015-07-14 DIAGNOSIS — S72001D Fracture of unspecified part of neck of right femur, subsequent encounter for closed fracture with routine healing: Secondary | ICD-10-CM | POA: Diagnosis not present

## 2015-07-14 DIAGNOSIS — M109 Gout, unspecified: Secondary | ICD-10-CM | POA: Diagnosis not present

## 2015-07-14 DIAGNOSIS — E1122 Type 2 diabetes mellitus with diabetic chronic kidney disease: Secondary | ICD-10-CM | POA: Diagnosis not present

## 2015-07-14 NOTE — Telephone Encounter (Signed)
disregard

## 2015-07-15 ENCOUNTER — Encounter: Payer: Self-pay | Admitting: Internal Medicine

## 2015-07-15 DIAGNOSIS — L89612 Pressure ulcer of right heel, stage 2: Secondary | ICD-10-CM | POA: Diagnosis not present

## 2015-07-15 DIAGNOSIS — F0280 Dementia in other diseases classified elsewhere without behavioral disturbance: Secondary | ICD-10-CM | POA: Diagnosis not present

## 2015-07-15 DIAGNOSIS — E1122 Type 2 diabetes mellitus with diabetic chronic kidney disease: Secondary | ICD-10-CM | POA: Diagnosis not present

## 2015-07-15 DIAGNOSIS — M170 Bilateral primary osteoarthritis of knee: Secondary | ICD-10-CM | POA: Diagnosis not present

## 2015-07-15 DIAGNOSIS — G309 Alzheimer's disease, unspecified: Secondary | ICD-10-CM | POA: Diagnosis not present

## 2015-07-15 DIAGNOSIS — M109 Gout, unspecified: Secondary | ICD-10-CM | POA: Diagnosis not present

## 2015-07-15 DIAGNOSIS — Z0279 Encounter for issue of other medical certificate: Secondary | ICD-10-CM

## 2015-07-15 DIAGNOSIS — L89152 Pressure ulcer of sacral region, stage 2: Secondary | ICD-10-CM | POA: Diagnosis not present

## 2015-07-15 DIAGNOSIS — L89622 Pressure ulcer of left heel, stage 2: Secondary | ICD-10-CM | POA: Diagnosis not present

## 2015-07-15 DIAGNOSIS — S72001D Fracture of unspecified part of neck of right femur, subsequent encounter for closed fracture with routine healing: Secondary | ICD-10-CM | POA: Diagnosis not present

## 2015-07-15 DIAGNOSIS — N189 Chronic kidney disease, unspecified: Secondary | ICD-10-CM | POA: Diagnosis not present

## 2015-07-16 DIAGNOSIS — N189 Chronic kidney disease, unspecified: Secondary | ICD-10-CM | POA: Diagnosis not present

## 2015-07-16 DIAGNOSIS — S72001D Fracture of unspecified part of neck of right femur, subsequent encounter for closed fracture with routine healing: Secondary | ICD-10-CM | POA: Diagnosis not present

## 2015-07-16 DIAGNOSIS — E1122 Type 2 diabetes mellitus with diabetic chronic kidney disease: Secondary | ICD-10-CM | POA: Diagnosis not present

## 2015-07-16 DIAGNOSIS — M170 Bilateral primary osteoarthritis of knee: Secondary | ICD-10-CM | POA: Diagnosis not present

## 2015-07-16 DIAGNOSIS — L89612 Pressure ulcer of right heel, stage 2: Secondary | ICD-10-CM | POA: Diagnosis not present

## 2015-07-16 DIAGNOSIS — M109 Gout, unspecified: Secondary | ICD-10-CM | POA: Diagnosis not present

## 2015-07-16 DIAGNOSIS — L89152 Pressure ulcer of sacral region, stage 2: Secondary | ICD-10-CM | POA: Diagnosis not present

## 2015-07-16 DIAGNOSIS — G309 Alzheimer's disease, unspecified: Secondary | ICD-10-CM | POA: Diagnosis not present

## 2015-07-16 DIAGNOSIS — L89622 Pressure ulcer of left heel, stage 2: Secondary | ICD-10-CM | POA: Diagnosis not present

## 2015-07-16 DIAGNOSIS — F0280 Dementia in other diseases classified elsewhere without behavioral disturbance: Secondary | ICD-10-CM | POA: Diagnosis not present

## 2015-07-20 ENCOUNTER — Telehealth: Payer: Self-pay | Admitting: Internal Medicine

## 2015-07-20 DIAGNOSIS — L89152 Pressure ulcer of sacral region, stage 2: Secondary | ICD-10-CM | POA: Diagnosis not present

## 2015-07-20 DIAGNOSIS — L89612 Pressure ulcer of right heel, stage 2: Secondary | ICD-10-CM | POA: Diagnosis not present

## 2015-07-20 DIAGNOSIS — E1122 Type 2 diabetes mellitus with diabetic chronic kidney disease: Secondary | ICD-10-CM | POA: Diagnosis not present

## 2015-07-20 DIAGNOSIS — N189 Chronic kidney disease, unspecified: Secondary | ICD-10-CM | POA: Diagnosis not present

## 2015-07-20 DIAGNOSIS — S72001D Fracture of unspecified part of neck of right femur, subsequent encounter for closed fracture with routine healing: Secondary | ICD-10-CM | POA: Diagnosis not present

## 2015-07-20 DIAGNOSIS — M109 Gout, unspecified: Secondary | ICD-10-CM | POA: Diagnosis not present

## 2015-07-20 DIAGNOSIS — F0280 Dementia in other diseases classified elsewhere without behavioral disturbance: Secondary | ICD-10-CM | POA: Diagnosis not present

## 2015-07-20 DIAGNOSIS — L89622 Pressure ulcer of left heel, stage 2: Secondary | ICD-10-CM | POA: Diagnosis not present

## 2015-07-20 DIAGNOSIS — G309 Alzheimer's disease, unspecified: Secondary | ICD-10-CM | POA: Diagnosis not present

## 2015-07-20 DIAGNOSIS — M170 Bilateral primary osteoarthritis of knee: Secondary | ICD-10-CM | POA: Diagnosis not present

## 2015-07-20 MED ORDER — TRAZODONE HCL 50 MG PO TABS: 25.0000 mg | ORAL_TABLET | Freq: Every evening | ORAL | Status: DC | PRN

## 2015-07-20 NOTE — Telephone Encounter (Signed)
Shari Mcmahon has been made aware.

## 2015-07-20 NOTE — Telephone Encounter (Signed)
Geni Bers is calling to request help with patient.   I have called social worker to get FL2 back to her. Geni Bers states that the patient has not slept in a week and will not give any rest. She states that she thinks that there is a medication that can be given to geriatric patients to help them sleep. She requests some medication be called in for this. Advised that dr Jenny Reichmann does not work on Monday. She states that dr Tamala Julian has seen patient in the past. Advised that he most likely will not prescribe something like this without dr Judi Cong say-so. Advised that we will call her back once we have more information.

## 2015-07-20 NOTE — Telephone Encounter (Signed)
Sent in trazadone to help

## 2015-07-21 DIAGNOSIS — Z0279 Encounter for issue of other medical certificate: Secondary | ICD-10-CM

## 2015-07-21 DIAGNOSIS — L89152 Pressure ulcer of sacral region, stage 2: Secondary | ICD-10-CM | POA: Diagnosis not present

## 2015-07-21 DIAGNOSIS — S72001D Fracture of unspecified part of neck of right femur, subsequent encounter for closed fracture with routine healing: Secondary | ICD-10-CM | POA: Diagnosis not present

## 2015-07-21 DIAGNOSIS — G309 Alzheimer's disease, unspecified: Secondary | ICD-10-CM | POA: Diagnosis not present

## 2015-07-21 DIAGNOSIS — L89612 Pressure ulcer of right heel, stage 2: Secondary | ICD-10-CM | POA: Diagnosis not present

## 2015-07-21 DIAGNOSIS — M109 Gout, unspecified: Secondary | ICD-10-CM | POA: Diagnosis not present

## 2015-07-21 DIAGNOSIS — N189 Chronic kidney disease, unspecified: Secondary | ICD-10-CM | POA: Diagnosis not present

## 2015-07-21 DIAGNOSIS — L89622 Pressure ulcer of left heel, stage 2: Secondary | ICD-10-CM | POA: Diagnosis not present

## 2015-07-21 DIAGNOSIS — M170 Bilateral primary osteoarthritis of knee: Secondary | ICD-10-CM | POA: Diagnosis not present

## 2015-07-21 DIAGNOSIS — E1122 Type 2 diabetes mellitus with diabetic chronic kidney disease: Secondary | ICD-10-CM | POA: Diagnosis not present

## 2015-07-21 DIAGNOSIS — F0280 Dementia in other diseases classified elsewhere without behavioral disturbance: Secondary | ICD-10-CM | POA: Diagnosis not present

## 2015-07-22 DIAGNOSIS — D631 Anemia in chronic kidney disease: Secondary | ICD-10-CM | POA: Diagnosis not present

## 2015-07-22 DIAGNOSIS — M170 Bilateral primary osteoarthritis of knee: Secondary | ICD-10-CM | POA: Diagnosis not present

## 2015-07-22 DIAGNOSIS — G309 Alzheimer's disease, unspecified: Secondary | ICD-10-CM | POA: Diagnosis not present

## 2015-07-22 DIAGNOSIS — G894 Chronic pain syndrome: Secondary | ICD-10-CM | POA: Diagnosis not present

## 2015-07-22 DIAGNOSIS — S7290XS Unspecified fracture of unspecified femur, sequela: Secondary | ICD-10-CM | POA: Diagnosis not present

## 2015-07-22 DIAGNOSIS — M25511 Pain in right shoulder: Secondary | ICD-10-CM | POA: Diagnosis not present

## 2015-07-22 DIAGNOSIS — I10 Essential (primary) hypertension: Secondary | ICD-10-CM | POA: Diagnosis not present

## 2015-07-22 DIAGNOSIS — Z9181 History of falling: Secondary | ICD-10-CM | POA: Diagnosis not present

## 2015-07-22 DIAGNOSIS — M1A0110 Idiopathic chronic gout, right shoulder, without tophus (tophi): Secondary | ICD-10-CM | POA: Diagnosis not present

## 2015-07-22 DIAGNOSIS — M129 Arthropathy, unspecified: Secondary | ICD-10-CM | POA: Diagnosis not present

## 2015-07-22 DIAGNOSIS — S7290XA Unspecified fracture of unspecified femur, initial encounter for closed fracture: Secondary | ICD-10-CM | POA: Diagnosis not present

## 2015-07-22 DIAGNOSIS — S7290XD Unspecified fracture of unspecified femur, subsequent encounter for closed fracture with routine healing: Secondary | ICD-10-CM | POA: Diagnosis not present

## 2015-07-22 DIAGNOSIS — M109 Gout, unspecified: Secondary | ICD-10-CM | POA: Diagnosis not present

## 2015-07-22 DIAGNOSIS — E785 Hyperlipidemia, unspecified: Secondary | ICD-10-CM | POA: Diagnosis not present

## 2015-07-22 DIAGNOSIS — E119 Type 2 diabetes mellitus without complications: Secondary | ICD-10-CM | POA: Diagnosis not present

## 2015-07-24 DIAGNOSIS — M170 Bilateral primary osteoarthritis of knee: Secondary | ICD-10-CM | POA: Diagnosis not present

## 2015-07-28 ENCOUNTER — Encounter: Payer: Self-pay | Admitting: Internal Medicine

## 2015-07-28 ENCOUNTER — Non-Acute Institutional Stay (SKILLED_NURSING_FACILITY): Payer: Medicare Other | Admitting: Internal Medicine

## 2015-07-28 DIAGNOSIS — N390 Urinary tract infection, site not specified: Secondary | ICD-10-CM

## 2015-07-28 DIAGNOSIS — R634 Abnormal weight loss: Secondary | ICD-10-CM

## 2015-07-28 DIAGNOSIS — S7291XS Unspecified fracture of right femur, sequela: Secondary | ICD-10-CM

## 2015-07-28 DIAGNOSIS — M1710 Unilateral primary osteoarthritis, unspecified knee: Secondary | ICD-10-CM

## 2015-07-28 DIAGNOSIS — G309 Alzheimer's disease, unspecified: Secondary | ICD-10-CM

## 2015-07-28 DIAGNOSIS — Z85028 Personal history of other malignant neoplasm of stomach: Secondary | ICD-10-CM | POA: Diagnosis not present

## 2015-07-28 DIAGNOSIS — G894 Chronic pain syndrome: Secondary | ICD-10-CM

## 2015-07-28 DIAGNOSIS — F0280 Dementia in other diseases classified elsewhere without behavioral disturbance: Secondary | ICD-10-CM | POA: Insufficient documentation

## 2015-07-28 DIAGNOSIS — L8992 Pressure ulcer of unspecified site, stage 2: Secondary | ICD-10-CM | POA: Insufficient documentation

## 2015-07-28 DIAGNOSIS — E119 Type 2 diabetes mellitus without complications: Secondary | ICD-10-CM | POA: Diagnosis not present

## 2015-07-28 DIAGNOSIS — I10 Essential (primary) hypertension: Secondary | ICD-10-CM

## 2015-07-28 DIAGNOSIS — S7291XA Unspecified fracture of right femur, initial encounter for closed fracture: Secondary | ICD-10-CM

## 2015-07-28 DIAGNOSIS — M129 Arthropathy, unspecified: Secondary | ICD-10-CM | POA: Diagnosis not present

## 2015-07-28 HISTORY — DX: Unspecified fracture of right femur, initial encounter for closed fracture: S72.91XA

## 2015-07-28 HISTORY — DX: Dementia in other diseases classified elsewhere, unspecified severity, without behavioral disturbance, psychotic disturbance, mood disturbance, and anxiety: F02.80

## 2015-07-28 HISTORY — DX: Alzheimer's disease, unspecified: G30.9

## 2015-07-28 NOTE — Progress Notes (Signed)
Patient ID: Shari Mcmahon, female   DOB: [birth year], 81 y.o.   MRN: [id number]    HISTORY AND PHYSICAL   DATE: 07/28/15  Location:  [facility]    Place of Service: SNF [phone])   Extended Emergency Contact Information Primary Emergency Contact: Karnes,Donzel  [place] Phone: [phone] Relation: Son Secondary Emergency Contact: Murphy,Jacqueline Address: [place], [place] Phone: [phone] Work Phone: [phone] Relation: Daughter  Advanced Directive information  DNR  Chief Complaint  Patient presents with  . New Admit To SNF    HPI:  81 yo female seen today as a new admission into SNF following transfer from [facility]. She is s/p right femur ORIF due to fracture from a fall. She has 2 pressure sores (right heel and sacrum). Wound care is following her. Pain is controlled with norco. Family has requested that 10 mg dose be d/c'd and make norco 30m dose prn. Pt does not take med on a regular basis per nursing. She has depression and is stable on remeron and depakote. Psychoses stable with depakote. She has not had a gout attack and is currently on uloric and allopurinol for prevention. Prior to leaving [facility] she was dx with UTI and is taking doxy. CBG 83 today and she takes novolog prior to meals per SSI. No low BS reactions. she is on a prednisone taper for unknown reason. GERD stable on PPI. She takes senna for constipation. Cholesterol stable on statin. She takes vitamins/minerals as well as nutritional supplements. She has eye gtts for glaucoma  Past Medical History  Diagnosis Date  . ABDOMINAL PAIN, CHRONIC 04/07/2008  . ARTHRITIS 03/03/2008  . BRADYCARDIA, CHRONIC 12/17/2008  . CARDIAC MURMUR 02/01/2010  . CONSTIPATION, CHRONIC 10/15/2010  . DEGENERATIVE JOINT DISEASE, KNEES, BILATERAL 12/07/2007  . DEPRESSION 06/17/2009  . DIABETES MELLITUS, TYPE II 09/19/2007    DIET  CONTROL   . DYSPHAGIA UNSPECIFIED 12/24/2009  . Gastroparesis 12/24/2009  . GENERALIZED OSTEOARTHROSIS INVOLVING HAND 10/03/2007  . GLAUCOMA 09/19/2007  . GOUTY ARTHROPATHY UNSPECIFIED 02/17/2009  . GOUT 09/19/2007  . HYPERLIPIDEMIA 01/27/2010  . HYPERTENSION 09/19/2007  . LUNG NODULE 03/03/2008  . MENOPAUSAL DISORDER 12/17/2008  . PERIPHERAL NEUROPATHY 06/19/2008  . PERSONAL HISTORY MALIGNANT NEOPLASM STOMACH 09/19/2007  . 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 STOMACH CANCER 03/03/2008  . UTI 12/15/2009  . Arthritis of knee, degenerative 10/19/2011    Past Surgical History  Procedure Laterality Date  . Vagotomy    . Partial gastrectomy    . Billroth ii gastorenterostomy    . Colonoscopy      Patient Care Team: JBiagio Borg MD as PCP - General  Social History   Social History  . Marital Status: Widowed    Spouse Name: N/A  . Number of Children: 6  . Years of Education: 8   Occupational History  . retired    Social History Main Topics  . Smoking status: Former Smoker    Types: Cigarettes  . Smokeless tobacco: Never Used     Comment: use to smoke a pack of cigaretts a day for 25 yrs. but stopped 10 yrs. ago.   . Alcohol Use: No  . Drug Use: No  . Sexual Activity: Not on file   Other Topics Concern  . Not on file   Social History Narrative   Lives with her daughter  reports that she has quit smoking. Her smoking use included Cigarettes. She has never used smokeless tobacco. She reports that she does not drink alcohol or use illicit drugs.  Family History  Problem Relation Age of Onset  . Uterine cancer Mother   . Stroke Father     Hemorrhagic  . Cancer Sister     breast cancer and one sister with uterine cancer and one sister with ovarian cancerr  . Colon cancer Maternal Grandmother   . Colon cancer Paternal Grandfather   . Diabetes Other   . Stomach cancer Neg Hx   . Prostate cancer Brother    No family status information on file.     Immunization History  Administered Date(s) Administered  . H1N1 12/17/2008  . Influenza Whole 08/27/2007, 09/30/2008, 07/31/2009, 07/31/2010  . Pneumococcal Conjugate-13 11/22/2013  . Pneumococcal Polysaccharide-23 10/03/2007, 10/19/2011  . Td 12/15/2009    Allergies  Allergen Reactions  . Voltaren [Diclofenac Sodium] Other (See Comments)    Stomach irritation  . Dulcolax [Bisacodyl]     Unknown   . Duloxetine Hcl Other (See Comments)    Made patient not want to eat or drink  . Penicillins Swelling    Unknown   . Timolol     REACTION: bradycardia worse    Medications: Patient's Medications  New Prescriptions   No medications on file  Previous Medications   ACETAMINOPHEN (TYLENOL) 500 MG TABLET    Take 500 mg by mouth every 6 (six) hours as needed for mild pain or moderate pain.    ALLOPURINOL (ZYLOPRIM) 100 MG TABLET    Take 100 mg by mouth daily.   DIVALPROEX (DEPAKOTE SPRINKLES) 125 MG CAPSULE    Take 1 capsule (125 mg total) by mouth 2 (two) times daily.   DOXYCYCLINE (VIBRAMYCIN) 100 MG CAPSULE    Take 1 capsule (100 mg total) by mouth 2 (two) times daily.   GLUCOSE BLOOD (FREESTYLE LITE) TEST STRIP    Use as instructed   HYDROCODONE-ACETAMINOPHEN (NORCO) 10-325 MG PER TABLET    TAKE 1 TABLET BY MOUTH EVERY 8 HOURS AS NEEDED.   LATANOPROST (XALATAN) 0.005 % OPHTHALMIC SOLUTION    Place 1 drop into both eyes at bedtime.    MAGNESIUM HYDROXIDE (MILK OF MAGNESIA PO)    Take 15 mLs by mouth daily.   MIRTAZAPINE (REMERON) 7.5 MG TABLET    Take 7.5 mg by mouth at bedtime.   OMEPRAZOLE (PRILOSEC) 40 MG CAPSULE    Take 1 capsule (40 mg total) by mouth daily.   OXYCODONE-ACETAMINOPHEN (PERCOCET) 5-325 MG PER TABLET    Take 1 tablet by mouth every 4 (four) hours as needed.   PRAVASTATIN (PRAVACHOL) 10 MG TABLET    TAKE 1 TABLET BY MOUTH EVERY EVENING.   PREDNISONE (DELTASONE) 10 MG TABLET    Take 1 tablet (10 mg total) by mouth daily. 3 tabs by mouth per day for 3 days, then 2  tabs per day for 3 days, then 1 tab per day for 3 days, then stop   PRESCRIPTION MEDICATION    Skin prep. Apply to blisters on right and left heels twice daily   SENNA (SENOKOT) 8.6 MG TABLET    Take 1 tablet by mouth 2 (two) times daily.   TRAZODONE (DESYREL) 50 MG TABLET    Take 0.5-1 tablets (25-50 mg total) by mouth at bedtime as needed for sleep.  Modified Medications   No medications on file  Discontinued Medications   No medications on file  Review of Systems  Unable to perform ROS: Dementia    Filed Vitals:   07/28/15 1629  BP: 160/62  Pulse: 81  Temp: 98.2 F (36.8 C)  Weight: 134 lb (60.782 kg)   Body mass index is 21.64 kg/(m^2).  Physical Exam  Constitutional: She appears well-developed. No distress.  Frail appearing in NAD. Sitting in w/c.   HENT:  Mouth/Throat: Oropharynx is clear and moist. No oropharyngeal exudate.  Eyes: Pupils are equal, round, and reactive to light. No scleral icterus.  Neck: Neck supple. Carotid bruit is not present. No tracheal deviation present. No thyromegaly present.  Cardiovascular: Normal rate, regular rhythm and intact distal pulses.  Exam reveals no gallop and no friction rub.   Murmur (1/6 SEM at base) heard. No LE edema b/l. no calf TTP.   Pulmonary/Chest: Effort normal and breath sounds normal. No stridor. No respiratory distress. She has no wheezes. She has no rales.  Abdominal: Soft. Bowel sounds are normal. She exhibits no distension and no mass. There is no hepatomegaly. There is no tenderness. There is no rebound and no guarding.  Musculoskeletal: She exhibits edema (b/l knee medially with reduced ROM) and tenderness.  Lymphadenopathy:    She has no cervical adenopathy.  Neurological: She is alert. She has normal reflexes.  Skin: Skin is warm and dry. No rash noted.  Per wound care, very small stage 2 right heel and sacral pressure sore. No signs of secondary infection. Dressings c/d/i  Psychiatric: She has a normal mood  and affect. Her behavior is normal.     Labs reviewed: Admission on 06/16/2015, Discharged on 06/16/2015  Component Date Value Ref Range Status  . WBC 06/16/2015 9.0  4.0 - 10.5 K/uL Final  . RBC 06/16/2015 2.83* 3.87 - 5.11 MIL/uL Final  . Hemoglobin 06/16/2015 8.7* 12.0 - 15.0 g/dL Final  . HCT 06/16/2015 27.7* 36.0 - 46.0 % Final  . MCV 06/16/2015 97.9  78.0 - 100.0 fL Final  . MCH 06/16/2015 30.7  26.0 - 34.0 pg Final  . MCHC 06/16/2015 31.4  30.0 - 36.0 g/dL Final  . RDW 06/16/2015 16.9* 11.5 - 15.5 % Final  . Platelets 06/16/2015 534* 150 - 400 K/uL Final  . Neutrophils Relative % 06/16/2015 78* 43 - 77 % Final  . Neutro Abs 06/16/2015 7.0  1.7 - 7.7 K/uL Final  . Lymphocytes Relative 06/16/2015 13  12 - 46 % Final  . Lymphs Abs 06/16/2015 1.2  0.7 - 4.0 K/uL Final  . Monocytes Relative 06/16/2015 8  3 - 12 % Final  . Monocytes Absolute 06/16/2015 0.8  0.1 - 1.0 K/uL Final  . Eosinophils Relative 06/16/2015 1  0 - 5 % Final  . Eosinophils Absolute 06/16/2015 0.1  0.0 - 0.7 K/uL Final  . Basophils Relative 06/16/2015 0  0 - 1 % Final  . Basophils Absolute 06/16/2015 0.0  0.0 - 0.1 K/uL Final  . Sodium 06/16/2015 141  135 - 145 mmol/L Final  . Potassium 06/16/2015 3.7  3.5 - 5.1 mmol/L Final  . Chloride 06/16/2015 105  101 - 111 mmol/L Final  . CO2 06/16/2015 29  22 - 32 mmol/L Final  . Glucose, Bld 06/16/2015 154* 65 - 99 mg/dL Final  . BUN 06/16/2015 14  6 - 20 mg/dL Final  . Creatinine, Ser 06/16/2015 0.90  0.44 - 1.00 mg/dL Final  . Calcium 06/16/2015 9.1  8.9 - 10.3 mg/dL Final  . Total Protein 06/16/2015 6.7  6.5 - 8.1 g/dL Final  .  Albumin 06/16/2015 2.2* 3.5 - 5.0 g/dL Final  . AST 06/16/2015 22  15 - 41 U/L Final  . ALT 06/16/2015 9* 14 - 54 U/L Final  . Alkaline Phosphatase 06/16/2015 79  38 - 126 U/L Final  . Total Bilirubin 06/16/2015 0.3  0.3 - 1.2 mg/dL Final  . GFR calc non Af Amer 06/16/2015 56* >60 mL/min Final  . GFR calc Af Amer 06/16/2015 >60  >60  mL/min Final   Comment: (NOTE) The eGFR has been calculated using the CKD EPI equation. This calculation has not been validated in all clinical situations. eGFR's persistently <60 mL/min signify possible Chronic Kidney Disease.   . Anion gap 06/16/2015 7  5 - 15 Final  . Color, Urine 06/16/2015 YELLOW  YELLOW Final  . APPearance 06/16/2015 CLOUDY* CLEAR Final  . Specific Gravity, Urine 06/16/2015 1.019  1.005 - 1.030 Final  . pH 06/16/2015 7.0  5.0 - 8.0 Final  . Glucose, UA 06/16/2015 NEGATIVE  NEGATIVE mg/dL Final  . Hgb urine dipstick 06/16/2015 NEGATIVE  NEGATIVE Final  . Bilirubin Urine 06/16/2015 NEGATIVE  NEGATIVE Final  . Ketones, ur 06/16/2015 NEGATIVE  NEGATIVE mg/dL Final  . Protein, ur 06/16/2015 NEGATIVE  NEGATIVE mg/dL Final  . Urobilinogen, UA 06/16/2015 1.0  0.0 - 1.0 mg/dL Final  . Nitrite 06/16/2015 NEGATIVE  NEGATIVE Final  . Leukocytes, UA 06/16/2015 NEGATIVE  NEGATIVE Final   MICROSCOPIC NOT DONE ON URINES WITH NEGATIVE PROTEIN, BLOOD, LEUKOCYTES, NITRITE, OR GLUCOSE <1000 mg/dL.  Marland Kitchen Specimen Description 06/16/2015 Urine   Final  . Special Requests 06/16/2015 NONE   Final  . Culture 06/16/2015    Final                   Value:2,000 COLONIES/mL INSIGNIFICANT GROWTH Performed at [facility]   . Report Status 06/16/2015 06/18/2015 FINAL   Final  Appointment on 05/27/2015  Component Date Value Ref Range Status  . Glucose-Capillary 05/27/2015 96  65 - 99 mg/dL Final  . Comment 1 05/27/2015 Notify RN   Final  . Comment 2 05/27/2015 Document in Chart   Final  Admission on 05/08/2015, Discharged on 05/08/2015  Component Date Value Ref Range Status  . WBC 05/08/2015 14.4* 4.0 - 10.5 K/uL Final  . RBC 05/08/2015 3.14* 3.87 - 5.11 MIL/uL Final  . Hemoglobin 05/08/2015 9.8* 12.0 - 15.0 g/dL Final  . HCT 05/08/2015 30.8* 36.0 - 46.0 % Final  . MCV 05/08/2015 98.1  78.0 - 100.0 fL Final  . MCH 05/08/2015 31.2  26.0 - 34.0 pg Final  . MCHC 05/08/2015 31.8  30.0 -  36.0 g/dL Final  . RDW 05/08/2015 16.2* 11.5 - 15.5 % Final  . Platelets 05/08/2015 447* 150 - 400 K/uL Final  . Neutrophils Relative % 05/08/2015 72  43 - 77 % Final  . Neutro Abs 05/08/2015 10.4* 1.7 - 7.7 K/uL Final  . Lymphocytes Relative 05/08/2015 13  12 - 46 % Final  . Lymphs Abs 05/08/2015 1.9  0.7 - 4.0 K/uL Final  . Monocytes Relative 05/08/2015 14* 3 - 12 % Final  . Monocytes Absolute 05/08/2015 2.0* 0.1 - 1.0 K/uL Final  . Eosinophils Relative 05/08/2015 1  0 - 5 % Final  . Eosinophils Absolute 05/08/2015 0.1  0.0 - 0.7 K/uL Final  . Basophils Relative 05/08/2015 0  0 - 1 % Final  . Basophils Absolute 05/08/2015 0.0  0.0 - 0.1 K/uL Final  . Sodium 05/08/2015 139  135 - 145 mmol/L Final  .  Potassium 05/08/2015 4.7  3.5 - 5.1 mmol/L Final  . Chloride 05/08/2015 101  101 - 111 mmol/L Final  . CO2 05/08/2015 30  22 - 32 mmol/L Final  . Glucose, Bld 05/08/2015 100* 65 - 99 mg/dL Final  . BUN 05/08/2015 14  6 - 20 mg/dL Final  . Creatinine, Ser 05/08/2015 0.98  0.44 - 1.00 mg/dL Final  . Calcium 05/08/2015 8.7* 8.9 - 10.3 mg/dL Final  . GFR calc non Af Amer 05/08/2015 51* >60 mL/min Final  . GFR calc Af Amer 05/08/2015 59* >60 mL/min Final   Comment: (NOTE) The eGFR has been calculated using the CKD EPI equation. This calculation has not been validated in all clinical situations. eGFR's persistently <60 mL/min signify possible Chronic Kidney Disease.   . Anion gap 05/08/2015 8  5 - 15 Final    No results found.   Assessment/Plan   ICD-9-CM ICD-10-CM   1. Alzheimer's dementia - stable 331.0 G30.9    294.10 F02.80   2. Essential hypertension - uncontrolled 401.9 I10   3. Arthritis of knee - pain controlled 716.96 M12.9   4. Pressure ulcer stage II - stable 707.00 L89.92    707.22    5. Type 2 diabetes mellitus without complication - stable 312.81 E11.9   6. Chronic pain syndrome - stable 338.4 G89.4   7. PERSONAL HISTORY MALIGNANT NEOPLASM STOMACH V10.04 Z85.028   8.  Loss of weight - probably related to #7 and #1 783.21 R63.4   9. Fracture of right femur, sequela due to fall; s/p ORIF in 03/2015 905.4 S72.91XS   10. Urinary tract infection without hematuria, site unspecified - resolving 599.0 N39.0     --start lisinopril 5 mg po daily  --check BP every shift x 1 week and record  --STOP norco 10/325.  START norco 5/325 q8hrs prn pain  --continue doxy through 9/30th for UTI  --cont other meds as ordered  --PT/ST as ordered. OT as indicated  --cont nutritional supplements  --wound care to follow pressure sores  --GOAL: short term rehab with probable transition to long term care. Communicated with pt and nursing.  --will follow  Monica S. Perlie Gold  [facility] [address] [place], [place] [address] [phone] Cell (Monday-Friday 8 AM - 5 PM) [phone] After 5 PM and follow prompts

## 2015-08-03 ENCOUNTER — Encounter: Payer: Self-pay | Admitting: Internal Medicine

## 2015-08-25 ENCOUNTER — Encounter: Payer: Self-pay | Admitting: Internal Medicine

## 2015-08-25 ENCOUNTER — Non-Acute Institutional Stay (SKILLED_NURSING_FACILITY): Payer: Medicare Other | Admitting: Internal Medicine

## 2015-08-25 DIAGNOSIS — G894 Chronic pain syndrome: Secondary | ICD-10-CM

## 2015-08-25 DIAGNOSIS — F0280 Dementia in other diseases classified elsewhere without behavioral disturbance: Secondary | ICD-10-CM

## 2015-08-25 DIAGNOSIS — M1000 Idiopathic gout, unspecified site: Secondary | ICD-10-CM | POA: Diagnosis not present

## 2015-08-25 DIAGNOSIS — M25511 Pain in right shoulder: Secondary | ICD-10-CM

## 2015-08-25 DIAGNOSIS — G309 Alzheimer's disease, unspecified: Secondary | ICD-10-CM | POA: Diagnosis not present

## 2015-08-25 DIAGNOSIS — I10 Essential (primary) hypertension: Secondary | ICD-10-CM | POA: Diagnosis not present

## 2015-08-25 DIAGNOSIS — Z794 Long term (current) use of insulin: Secondary | ICD-10-CM | POA: Diagnosis not present

## 2015-08-25 DIAGNOSIS — E119 Type 2 diabetes mellitus without complications: Secondary | ICD-10-CM | POA: Diagnosis not present

## 2015-08-25 NOTE — Progress Notes (Signed)
Patient ID: Shari Mcmahon, female   DOB: [birth year], 81 y.o.   MRN: [id number]    DATE: 08/25/15  Location:  [facility]    Place of Service: [phone])   Extended Emergency Contact Information Primary Emergency Contact: Araque,Donzel  [place] Phone: [phone] Relation: Son Secondary Emergency Contact: Murphy,Jacqueline Address: [place], [place] Phone: [phone] Work Phone: [phone] Relation: Daughter  Advanced Directive information  DNR  Chief Complaint  Patient presents with  . Medical Management of Chronic Issues    HPI:  81 yo female long term resident seen today for f/u. She c/o right shoulder pain and unable to lift it without using left hand to assist. She has a hx gout and currently take allopurinol and uloric. She is a poor historian due to dementia. Hx obtained from chart  Chronic pain/arthritis - takes norco prn  Depression - stable on mirtazapine, depkaote  Dementia - stable on aricept.   DM w neuropathy- CBGs 100-250. No low BS reactions. She takes SSI  Constipation - stable. She takes senna. She has GERD and also takes omeprazole  Hyperlipidemia - stable on statin. No myalgias  HTN - BP stable on lisinopril  She takes vitamins/minerals. She uses eye gtts for glaucoma. She has nutritional supplements due to poor po intake   Past Medical History  Diagnosis Date  . ABDOMINAL PAIN, CHRONIC 04/07/2008  . ARTHRITIS 03/03/2008  . BRADYCARDIA, CHRONIC 12/17/2008  . CARDIAC MURMUR 02/01/2010  . CONSTIPATION, CHRONIC 10/15/2010  . DEGENERATIVE JOINT DISEASE, KNEES, BILATERAL 12/07/2007  . DEPRESSION 06/17/2009  . DIABETES MELLITUS, TYPE II 09/19/2007    DIET CONTROL   . DYSPHAGIA UNSPECIFIED 12/24/2009  . Gastroparesis 12/24/2009  . GENERALIZED OSTEOARTHROSIS INVOLVING HAND 10/03/2007  . GLAUCOMA 09/19/2007  . GOUTY ARTHROPATHY UNSPECIFIED 02/17/2009  . GOUT  09/19/2007  . HYPERLIPIDEMIA 01/27/2010  . HYPERTENSION 09/19/2007  . LUNG NODULE 03/03/2008  . MENOPAUSAL DISORDER 12/17/2008  . PERIPHERAL NEUROPATHY 06/19/2008  . PERSONAL HISTORY MALIGNANT NEOPLASM STOMACH 09/19/2007  . 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 STOMACH CANCER 03/03/2008  . UTI 12/15/2009  . Arthritis of knee, degenerative 10/19/2011    Past Surgical History  Procedure Laterality Date  . Vagotomy    . Partial gastrectomy    . Billroth ii gastorenterostomy    . Colonoscopy      Patient Care Team: Biagio Borg, MD as PCP - General  Social History   Social History  . Marital Status: Widowed    Spouse Name: N/A  . Number of Children: 6  . Years of Education: 8   Occupational History  . retired    Social History Main Topics  . Smoking status: Former Smoker    Types: Cigarettes  . Smokeless tobacco: Never Used     Comment: use to smoke a pack of cigaretts a day for 25 yrs. but stopped 10 yrs. ago.   . Alcohol Use: No  . Drug Use: No  . Sexual Activity: Not on file   Other Topics Concern  . Not on file   Social History Narrative   Lives with her daughter     reports that she has quit smoking. Her smoking use included Cigarettes. She has never used smokeless tobacco. She reports that she does not drink alcohol or use illicit drugs.  Immunization History  Administered Date(s) Administered  .  H1N1 12/17/2008  . Influenza Whole 08/27/2007, 09/30/2008, 07/31/2009, 07/31/2010  . Pneumococcal Conjugate-13 11/22/2013  . Pneumococcal Polysaccharide-23 10/03/2007, 10/19/2011  . Td 12/15/2009    Allergies  Allergen Reactions  . Voltaren [Diclofenac Sodium] Other (See Comments)    Stomach irritation  . Dulcolax [Bisacodyl]     Unknown   . Duloxetine Hcl Other (See Comments)    Made patient not want to eat or drink  . Penicillins Swelling    Unknown   . Timolol     REACTION: bradycardia worse    Medications: Patient's Medications   New Prescriptions   No medications on file  Previous Medications   ACETAMINOPHEN (TYLENOL) 500 MG TABLET    Take 500 mg by mouth every 6 (six) hours as needed for mild pain or moderate pain.    ALLOPURINOL (ZYLOPRIM) 100 MG TABLET    Take 100 mg by mouth daily.   DIVALPROEX (DEPAKOTE SPRINKLES) 125 MG CAPSULE    Take 1 capsule (125 mg total) by mouth 2 (two) times daily.   DOXYCYCLINE (VIBRAMYCIN) 100 MG CAPSULE    Take 1 capsule (100 mg total) by mouth 2 (two) times daily.   GLUCOSE BLOOD (FREESTYLE LITE) TEST STRIP    Use as instructed   HYDROCODONE-ACETAMINOPHEN (NORCO) 10-325 MG PER TABLET    TAKE 1 TABLET BY MOUTH EVERY 8 HOURS AS NEEDED.   LATANOPROST (XALATAN) 0.005 % OPHTHALMIC SOLUTION    Place 1 drop into both eyes at bedtime.    MAGNESIUM HYDROXIDE (MILK OF MAGNESIA PO)    Take 15 mLs by mouth daily.   MIRTAZAPINE (REMERON) 7.5 MG TABLET    Take 7.5 mg by mouth at bedtime.   OMEPRAZOLE (PRILOSEC) 40 MG CAPSULE    Take 1 capsule (40 mg total) by mouth daily.   OXYCODONE-ACETAMINOPHEN (PERCOCET) 5-325 MG PER TABLET    Take 1 tablet by mouth every 4 (four) hours as needed.   PRAVASTATIN (PRAVACHOL) 10 MG TABLET    TAKE 1 TABLET BY MOUTH EVERY EVENING.   PREDNISONE (DELTASONE) 10 MG TABLET    Take 1 tablet (10 mg total) by mouth daily. 3 tabs by mouth per day for 3 days, then 2 tabs per day for 3 days, then 1 tab per day for 3 days, then stop   PRESCRIPTION MEDICATION    Skin prep. Apply to blisters on right and left heels twice daily   SENNA (SENOKOT) 8.6 MG TABLET    Take 1 tablet by mouth 2 (two) times daily.   TRAZODONE (DESYREL) 50 MG TABLET    Take 0.5-1 tablets (25-50 mg total) by mouth at bedtime as needed for sleep.  Modified Medications   No medications on file  Discontinued Medications   No medications on file    Review of Systems  Unable to perform ROS: Dementia    Filed Vitals:   08/25/15 1537  BP: 112/67  Pulse: 73  Temp: 97.6 F (36.4 C)  Weight: 128 lb  (58.06 kg)  SpO2: 97%   Body mass index is 20.67 kg/(m^2).  Physical Exam  Constitutional: She appears well-developed. No distress.  Lying in bed in NAD. Frail appearing  HENT:  Mouth/Throat: Oropharynx is clear and moist. No oropharyngeal exudate.  Eyes: Pupils are equal, round, and reactive to light. No scleral icterus.  Neck: Neck supple. Carotid bruit is present. No tracheal deviation present.  Cardiovascular: Normal rate, regular rhythm and intact distal pulses.  Exam reveals no gallop and no friction rub.     Murmur (2/6 SEM) heard. No LE edema b/l. no calf TTP.   Pulmonary/Chest: Effort normal and breath sounds normal. No stridor. No respiratory distress. She has no wheezes. She has no rales.  Abdominal: Soft. Bowel sounds are normal. She exhibits no distension and no mass. There is no hepatomegaly. There is no tenderness. There is no rebound and no guarding.  Musculoskeletal: She exhibits edema and tenderness.  Reduced right ROM and coracoid process TTP with lateral malleolus swelling and TTP. (+) TTP with supination. Reduced grip strength on right. B/l knee swelling medially.  Lymphadenopathy:    She has no cervical adenopathy.  Neurological: She is alert.  Skin: Skin is warm and dry. No rash noted.  Psychiatric: She has a normal mood and affect. Her behavior is normal.     Labs reviewed: Admission on 06/16/2015, Discharged on 06/16/2015  Component Date Value Ref Range Status  . WBC 06/16/2015 9.0  4.0 - 10.5 K/uL Final  . RBC 06/16/2015 2.83* 3.87 - 5.11 MIL/uL Final  . Hemoglobin 06/16/2015 8.7* 12.0 - 15.0 g/dL Final  . HCT 06/16/2015 27.7* 36.0 - 46.0 % Final  . MCV 06/16/2015 97.9  78.0 - 100.0 fL Final  . MCH 06/16/2015 30.7  26.0 - 34.0 pg Final  . MCHC 06/16/2015 31.4  30.0 - 36.0 g/dL Final  . RDW 06/16/2015 16.9* 11.5 - 15.5 % Final  . Platelets 06/16/2015 534* 150 - 400 K/uL Final  . Neutrophils Relative % 06/16/2015 78* 43 - 77 % Final  . Neutro Abs  06/16/2015 7.0  1.7 - 7.7 K/uL Final  . Lymphocytes Relative 06/16/2015 13  12 - 46 % Final  . Lymphs Abs 06/16/2015 1.2  0.7 - 4.0 K/uL Final  . Monocytes Relative 06/16/2015 8  3 - 12 % Final  . Monocytes Absolute 06/16/2015 0.8  0.1 - 1.0 K/uL Final  . Eosinophils Relative 06/16/2015 1  0 - 5 % Final  . Eosinophils Absolute 06/16/2015 0.1  0.0 - 0.7 K/uL Final  . Basophils Relative 06/16/2015 0  0 - 1 % Final  . Basophils Absolute 06/16/2015 0.0  0.0 - 0.1 K/uL Final  . Sodium 06/16/2015 141  135 - 145 mmol/L Final  . Potassium 06/16/2015 3.7  3.5 - 5.1 mmol/L Final  . Chloride 06/16/2015 105  101 - 111 mmol/L Final  . CO2 06/16/2015 29  22 - 32 mmol/L Final  . Glucose, Bld 06/16/2015 154* 65 - 99 mg/dL Final  . BUN 06/16/2015 14  6 - 20 mg/dL Final  . Creatinine, Ser 06/16/2015 0.90  0.44 - 1.00 mg/dL Final  . Calcium 06/16/2015 9.1  8.9 - 10.3 mg/dL Final  . Total Protein 06/16/2015 6.7  6.5 - 8.1 g/dL Final  . Albumin 06/16/2015 2.2* 3.5 - 5.0 g/dL Final  . AST 06/16/2015 22  15 - 41 U/L Final  . ALT 06/16/2015 9* 14 - 54 U/L Final  . Alkaline Phosphatase 06/16/2015 79  38 - 126 U/L Final  . Total Bilirubin 06/16/2015 0.3  0.3 - 1.2 mg/dL Final  . GFR calc non Af Amer 06/16/2015 56* >60 mL/min Final  . GFR calc Af Amer 06/16/2015 >60  >60 mL/min Final   Comment: (NOTE) The eGFR has been calculated using the CKD EPI equation. This calculation has not been validated in all clinical situations. eGFR's persistently <60 mL/min signify possible Chronic Kidney Disease.   . Anion gap 06/16/2015 7  5 - 15 Final  . Color, Urine 06/16/2015 YELLOW  YELLOW Final  . APPearance 06/16/2015 CLOUDY* CLEAR Final  . Specific Gravity, Urine 06/16/2015 1.019  1.005 - 1.030 Final  . pH 06/16/2015 7.0  5.0 - 8.0 Final  . Glucose, UA 06/16/2015 NEGATIVE  NEGATIVE mg/dL Final  . Hgb urine dipstick 06/16/2015 NEGATIVE  NEGATIVE Final  . Bilirubin Urine 06/16/2015 NEGATIVE  NEGATIVE Final  .  Ketones, ur 06/16/2015 NEGATIVE  NEGATIVE mg/dL Final  . Protein, ur 06/16/2015 NEGATIVE  NEGATIVE mg/dL Final  . Urobilinogen, UA 06/16/2015 1.0  0.0 - 1.0 mg/dL Final  . Nitrite 06/16/2015 NEGATIVE  NEGATIVE Final  . Leukocytes, UA 06/16/2015 NEGATIVE  NEGATIVE Final   MICROSCOPIC NOT DONE ON URINES WITH NEGATIVE PROTEIN, BLOOD, LEUKOCYTES, NITRITE, OR GLUCOSE <1000 mg/dL.  Marland Kitchen Specimen Description 06/16/2015 Urine   Final  . Special Requests 06/16/2015 NONE   Final  . Culture 06/16/2015    Final                   Value:2,000 COLONIES/mL INSIGNIFICANT GROWTH Performed at [facility]   . Report Status 06/16/2015 06/18/2015 FINAL   Final  Appointment on 05/27/2015  Component Date Value Ref Range Status  . Glucose-Capillary 05/27/2015 96  65 - 99 mg/dL Final  . Comment 1 05/27/2015 Notify RN   Final  . Comment 2 05/27/2015 Document in Chart   Final    No results found.   Assessment/Plan   ICD-9-CM ICD-10-CM   1. Right shoulder pain - uncontrolled 719.41 M25.511   2. Alzheimer's dementia - stable 331.0 G30.9    294.10 F02.80   3. Chronic pain syndrome - stable 338.4 G89.4   4. Essential hypertension - stable 401.9 I10   5. Type 2 diabetes mellitus without complication, with long-term current use of insulin (HCC) - stable 250.00 E11.9    V58.67 Z79.4   6. Idiopathic gout, unspecified chronicity, unspecified site - stable 274.9 M10.00     Check right shoulder xray  Check uric acid level  Cont other meds as ordered  Cont nutritional supplements as ordered  PT as ordered. OT/ST as indicated  Will follow  Rhonda Vangieson S. Perlie Gold  [facility] [address] [place], [place] [address] [phone] Cell (Monday-Friday 8 AM - 5 PM) [phone] After 5 PM and follow prompts

## 2015-09-01 DIAGNOSIS — M179 Osteoarthritis of knee, unspecified: Secondary | ICD-10-CM | POA: Diagnosis not present

## 2015-09-01 DIAGNOSIS — F339 Major depressive disorder, recurrent, unspecified: Secondary | ICD-10-CM | POA: Diagnosis not present

## 2015-09-01 DIAGNOSIS — E119 Type 2 diabetes mellitus without complications: Secondary | ICD-10-CM | POA: Diagnosis not present

## 2015-09-01 DIAGNOSIS — F0391 Unspecified dementia with behavioral disturbance: Secondary | ICD-10-CM | POA: Diagnosis not present

## 2015-09-01 DIAGNOSIS — F29 Unspecified psychosis not due to a substance or known physiological condition: Secondary | ICD-10-CM | POA: Diagnosis not present

## 2015-09-01 DIAGNOSIS — L89612 Pressure ulcer of right heel, stage 2: Secondary | ICD-10-CM | POA: Diagnosis not present

## 2015-09-01 DIAGNOSIS — K5900 Constipation, unspecified: Secondary | ICD-10-CM | POA: Diagnosis not present

## 2015-09-01 DIAGNOSIS — R634 Abnormal weight loss: Secondary | ICD-10-CM | POA: Diagnosis not present

## 2015-09-01 DIAGNOSIS — G309 Alzheimer's disease, unspecified: Secondary | ICD-10-CM | POA: Diagnosis not present

## 2015-09-01 DIAGNOSIS — D631 Anemia in chronic kidney disease: Secondary | ICD-10-CM | POA: Diagnosis not present

## 2015-09-01 DIAGNOSIS — M1A9XX0 Chronic gout, unspecified, without tophus (tophi): Secondary | ICD-10-CM | POA: Diagnosis not present

## 2015-09-01 DIAGNOSIS — L89152 Pressure ulcer of sacral region, stage 2: Secondary | ICD-10-CM | POA: Diagnosis not present

## 2015-09-01 DIAGNOSIS — E785 Hyperlipidemia, unspecified: Secondary | ICD-10-CM | POA: Diagnosis not present

## 2015-09-01 DIAGNOSIS — N390 Urinary tract infection, site not specified: Secondary | ICD-10-CM | POA: Diagnosis not present

## 2015-09-01 DIAGNOSIS — Z9181 History of falling: Secondary | ICD-10-CM | POA: Diagnosis not present

## 2015-09-01 DIAGNOSIS — L89893 Pressure ulcer of other site, stage 3: Secondary | ICD-10-CM | POA: Diagnosis not present

## 2015-09-01 DIAGNOSIS — S7290XD Unspecified fracture of unspecified femur, subsequent encounter for closed fracture with routine healing: Secondary | ICD-10-CM | POA: Diagnosis not present

## 2015-09-01 DIAGNOSIS — M7989 Other specified soft tissue disorders: Secondary | ICD-10-CM | POA: Diagnosis not present

## 2015-09-01 DIAGNOSIS — H44513 Absolute glaucoma, bilateral: Secondary | ICD-10-CM | POA: Diagnosis not present

## 2015-09-01 DIAGNOSIS — K219 Gastro-esophageal reflux disease without esophagitis: Secondary | ICD-10-CM | POA: Diagnosis not present

## 2015-09-01 DIAGNOSIS — I10 Essential (primary) hypertension: Secondary | ICD-10-CM | POA: Diagnosis not present

## 2015-09-01 DIAGNOSIS — M109 Gout, unspecified: Secondary | ICD-10-CM | POA: Diagnosis not present

## 2015-09-04 ENCOUNTER — Other Ambulatory Visit: Payer: Self-pay | Admitting: *Deleted

## 2015-09-04 MED ORDER — HYDROCODONE-ACETAMINOPHEN 5-325 MG PO TABS: ORAL_TABLET | ORAL | Status: DC

## 2015-09-04 NOTE — Telephone Encounter (Signed)
[facility] 

## 2015-09-10 ENCOUNTER — Encounter (HOSPITAL_BASED_OUTPATIENT_CLINIC_OR_DEPARTMENT_OTHER): Payer: Medicare Other

## 2015-09-11 DIAGNOSIS — L89893 Pressure ulcer of other site, stage 3: Secondary | ICD-10-CM | POA: Diagnosis not present

## 2015-09-18 DIAGNOSIS — L89893 Pressure ulcer of other site, stage 3: Secondary | ICD-10-CM | POA: Diagnosis not present

## 2015-09-20 DIAGNOSIS — M79641 Pain in right hand: Secondary | ICD-10-CM | POA: Diagnosis not present

## 2015-09-20 DIAGNOSIS — M24562 Contracture, left knee: Secondary | ICD-10-CM | POA: Diagnosis not present

## 2015-09-20 DIAGNOSIS — M24561 Contracture, right knee: Secondary | ICD-10-CM | POA: Diagnosis not present

## 2015-09-20 DIAGNOSIS — M179 Osteoarthritis of knee, unspecified: Secondary | ICD-10-CM | POA: Diagnosis not present

## 2015-09-20 DIAGNOSIS — M25521 Pain in right elbow: Secondary | ICD-10-CM | POA: Diagnosis not present

## 2015-09-20 DIAGNOSIS — M25511 Pain in right shoulder: Secondary | ICD-10-CM | POA: Diagnosis not present

## 2015-09-20 DIAGNOSIS — M6281 Muscle weakness (generalized): Secondary | ICD-10-CM | POA: Diagnosis not present

## 2015-09-20 DIAGNOSIS — R1310 Dysphagia, unspecified: Secondary | ICD-10-CM | POA: Diagnosis not present

## 2015-09-20 DIAGNOSIS — R41841 Cognitive communication deficit: Secondary | ICD-10-CM | POA: Diagnosis not present

## 2015-09-21 ENCOUNTER — Non-Acute Institutional Stay (SKILLED_NURSING_FACILITY): Payer: Medicare Other | Admitting: Adult Health

## 2015-09-21 DIAGNOSIS — M10041 Idiopathic gout, right hand: Secondary | ICD-10-CM

## 2015-09-21 DIAGNOSIS — R2231 Localized swelling, mass and lump, right upper limb: Secondary | ICD-10-CM | POA: Diagnosis not present

## 2015-09-21 DIAGNOSIS — I10 Essential (primary) hypertension: Secondary | ICD-10-CM | POA: Diagnosis not present

## 2015-09-21 DIAGNOSIS — M79641 Pain in right hand: Secondary | ICD-10-CM | POA: Diagnosis not present

## 2015-09-22 DIAGNOSIS — R1310 Dysphagia, unspecified: Secondary | ICD-10-CM | POA: Diagnosis not present

## 2015-09-22 DIAGNOSIS — M24562 Contracture, left knee: Secondary | ICD-10-CM | POA: Diagnosis not present

## 2015-09-22 DIAGNOSIS — M79641 Pain in right hand: Secondary | ICD-10-CM | POA: Diagnosis not present

## 2015-09-22 DIAGNOSIS — M25511 Pain in right shoulder: Secondary | ICD-10-CM | POA: Diagnosis not present

## 2015-09-22 DIAGNOSIS — M24561 Contracture, right knee: Secondary | ICD-10-CM | POA: Diagnosis not present

## 2015-09-22 DIAGNOSIS — M6281 Muscle weakness (generalized): Secondary | ICD-10-CM | POA: Diagnosis not present

## 2015-09-22 DIAGNOSIS — M25521 Pain in right elbow: Secondary | ICD-10-CM | POA: Diagnosis not present

## 2015-09-22 DIAGNOSIS — M179 Osteoarthritis of knee, unspecified: Secondary | ICD-10-CM | POA: Diagnosis not present

## 2015-09-22 DIAGNOSIS — R41841 Cognitive communication deficit: Secondary | ICD-10-CM | POA: Diagnosis not present

## 2015-09-23 DIAGNOSIS — E785 Hyperlipidemia, unspecified: Secondary | ICD-10-CM | POA: Diagnosis not present

## 2015-09-23 DIAGNOSIS — M79641 Pain in right hand: Secondary | ICD-10-CM | POA: Diagnosis not present

## 2015-09-23 DIAGNOSIS — R1310 Dysphagia, unspecified: Secondary | ICD-10-CM | POA: Diagnosis not present

## 2015-09-23 DIAGNOSIS — E089 Diabetes mellitus due to underlying condition without complications: Secondary | ICD-10-CM | POA: Diagnosis not present

## 2015-09-23 DIAGNOSIS — M24562 Contracture, left knee: Secondary | ICD-10-CM | POA: Diagnosis not present

## 2015-09-23 DIAGNOSIS — M25511 Pain in right shoulder: Secondary | ICD-10-CM | POA: Diagnosis not present

## 2015-09-23 DIAGNOSIS — M24561 Contracture, right knee: Secondary | ICD-10-CM | POA: Diagnosis not present

## 2015-09-23 DIAGNOSIS — D649 Anemia, unspecified: Secondary | ICD-10-CM | POA: Diagnosis not present

## 2015-09-23 DIAGNOSIS — M179 Osteoarthritis of knee, unspecified: Secondary | ICD-10-CM | POA: Diagnosis not present

## 2015-09-23 DIAGNOSIS — M6281 Muscle weakness (generalized): Secondary | ICD-10-CM | POA: Diagnosis not present

## 2015-09-23 DIAGNOSIS — R41841 Cognitive communication deficit: Secondary | ICD-10-CM | POA: Diagnosis not present

## 2015-09-23 DIAGNOSIS — I10 Essential (primary) hypertension: Secondary | ICD-10-CM | POA: Diagnosis not present

## 2015-09-23 DIAGNOSIS — M25521 Pain in right elbow: Secondary | ICD-10-CM | POA: Diagnosis not present

## 2015-09-29 ENCOUNTER — Non-Acute Institutional Stay (SKILLED_NURSING_FACILITY): Payer: Medicare Other | Admitting: Adult Health

## 2015-09-29 ENCOUNTER — Encounter: Payer: Self-pay | Admitting: Internal Medicine

## 2015-09-29 DIAGNOSIS — D500 Iron deficiency anemia secondary to blood loss (chronic): Secondary | ICD-10-CM | POA: Diagnosis not present

## 2015-09-29 DIAGNOSIS — M79641 Pain in right hand: Secondary | ICD-10-CM | POA: Diagnosis not present

## 2015-09-29 DIAGNOSIS — R634 Abnormal weight loss: Secondary | ICD-10-CM

## 2015-09-29 DIAGNOSIS — F0280 Dementia in other diseases classified elsewhere without behavioral disturbance: Secondary | ICD-10-CM

## 2015-09-29 DIAGNOSIS — M24562 Contracture, left knee: Secondary | ICD-10-CM | POA: Diagnosis not present

## 2015-09-29 DIAGNOSIS — M179 Osteoarthritis of knee, unspecified: Secondary | ICD-10-CM | POA: Diagnosis not present

## 2015-09-29 DIAGNOSIS — M25521 Pain in right elbow: Secondary | ICD-10-CM | POA: Diagnosis not present

## 2015-09-29 DIAGNOSIS — M25511 Pain in right shoulder: Secondary | ICD-10-CM | POA: Diagnosis not present

## 2015-09-29 DIAGNOSIS — G309 Alzheimer's disease, unspecified: Secondary | ICD-10-CM

## 2015-09-29 DIAGNOSIS — H409 Unspecified glaucoma: Secondary | ICD-10-CM | POA: Diagnosis not present

## 2015-09-29 DIAGNOSIS — E119 Type 2 diabetes mellitus without complications: Secondary | ICD-10-CM

## 2015-09-29 DIAGNOSIS — R41841 Cognitive communication deficit: Secondary | ICD-10-CM | POA: Diagnosis not present

## 2015-09-29 DIAGNOSIS — M6281 Muscle weakness (generalized): Secondary | ICD-10-CM | POA: Diagnosis not present

## 2015-09-29 DIAGNOSIS — K219 Gastro-esophageal reflux disease without esophagitis: Secondary | ICD-10-CM

## 2015-09-29 DIAGNOSIS — I10 Essential (primary) hypertension: Secondary | ICD-10-CM | POA: Diagnosis not present

## 2015-09-29 DIAGNOSIS — R1310 Dysphagia, unspecified: Secondary | ICD-10-CM | POA: Diagnosis not present

## 2015-09-29 DIAGNOSIS — M24561 Contracture, right knee: Secondary | ICD-10-CM | POA: Diagnosis not present

## 2015-09-29 DIAGNOSIS — M10041 Idiopathic gout, right hand: Secondary | ICD-10-CM

## 2015-09-30 DIAGNOSIS — R41841 Cognitive communication deficit: Secondary | ICD-10-CM | POA: Diagnosis not present

## 2015-09-30 DIAGNOSIS — M6281 Muscle weakness (generalized): Secondary | ICD-10-CM | POA: Diagnosis not present

## 2015-09-30 DIAGNOSIS — M25521 Pain in right elbow: Secondary | ICD-10-CM | POA: Diagnosis not present

## 2015-09-30 DIAGNOSIS — M79672 Pain in left foot: Secondary | ICD-10-CM | POA: Diagnosis not present

## 2015-09-30 DIAGNOSIS — E119 Type 2 diabetes mellitus without complications: Secondary | ICD-10-CM | POA: Diagnosis not present

## 2015-09-30 DIAGNOSIS — M24561 Contracture, right knee: Secondary | ICD-10-CM | POA: Diagnosis not present

## 2015-09-30 DIAGNOSIS — D509 Iron deficiency anemia, unspecified: Secondary | ICD-10-CM | POA: Diagnosis not present

## 2015-09-30 DIAGNOSIS — M79641 Pain in right hand: Secondary | ICD-10-CM | POA: Diagnosis not present

## 2015-09-30 DIAGNOSIS — M24562 Contracture, left knee: Secondary | ICD-10-CM | POA: Diagnosis not present

## 2015-09-30 DIAGNOSIS — M79671 Pain in right foot: Secondary | ICD-10-CM | POA: Diagnosis not present

## 2015-09-30 DIAGNOSIS — M25511 Pain in right shoulder: Secondary | ICD-10-CM | POA: Diagnosis not present

## 2015-09-30 DIAGNOSIS — M179 Osteoarthritis of knee, unspecified: Secondary | ICD-10-CM | POA: Diagnosis not present

## 2015-09-30 DIAGNOSIS — R1310 Dysphagia, unspecified: Secondary | ICD-10-CM | POA: Diagnosis not present

## 2015-09-30 DIAGNOSIS — E43 Unspecified severe protein-calorie malnutrition: Secondary | ICD-10-CM | POA: Diagnosis not present

## 2015-09-30 DIAGNOSIS — B351 Tinea unguium: Secondary | ICD-10-CM | POA: Diagnosis not present

## 2015-10-02 DIAGNOSIS — M25511 Pain in right shoulder: Secondary | ICD-10-CM | POA: Diagnosis not present

## 2015-10-02 DIAGNOSIS — M6281 Muscle weakness (generalized): Secondary | ICD-10-CM | POA: Diagnosis not present

## 2015-10-02 DIAGNOSIS — R1310 Dysphagia, unspecified: Secondary | ICD-10-CM | POA: Diagnosis not present

## 2015-10-02 DIAGNOSIS — M79641 Pain in right hand: Secondary | ICD-10-CM | POA: Diagnosis not present

## 2015-10-02 DIAGNOSIS — M179 Osteoarthritis of knee, unspecified: Secondary | ICD-10-CM | POA: Diagnosis not present

## 2015-10-02 DIAGNOSIS — M25521 Pain in right elbow: Secondary | ICD-10-CM | POA: Diagnosis not present

## 2015-10-02 DIAGNOSIS — R41841 Cognitive communication deficit: Secondary | ICD-10-CM | POA: Diagnosis not present

## 2015-10-02 DIAGNOSIS — M24562 Contracture, left knee: Secondary | ICD-10-CM | POA: Diagnosis not present

## 2015-10-02 DIAGNOSIS — M24561 Contracture, right knee: Secondary | ICD-10-CM | POA: Diagnosis not present

## 2015-10-06 DIAGNOSIS — Z9181 History of falling: Secondary | ICD-10-CM | POA: Diagnosis not present

## 2015-10-06 DIAGNOSIS — M109 Gout, unspecified: Secondary | ICD-10-CM | POA: Diagnosis not present

## 2015-10-07 DIAGNOSIS — M179 Osteoarthritis of knee, unspecified: Secondary | ICD-10-CM | POA: Diagnosis not present

## 2015-10-07 DIAGNOSIS — R1310 Dysphagia, unspecified: Secondary | ICD-10-CM | POA: Diagnosis not present

## 2015-10-07 DIAGNOSIS — R41841 Cognitive communication deficit: Secondary | ICD-10-CM | POA: Diagnosis not present

## 2015-10-07 DIAGNOSIS — M25511 Pain in right shoulder: Secondary | ICD-10-CM | POA: Diagnosis not present

## 2015-10-07 DIAGNOSIS — M79641 Pain in right hand: Secondary | ICD-10-CM | POA: Diagnosis not present

## 2015-10-07 DIAGNOSIS — M6281 Muscle weakness (generalized): Secondary | ICD-10-CM | POA: Diagnosis not present

## 2015-10-07 DIAGNOSIS — M24561 Contracture, right knee: Secondary | ICD-10-CM | POA: Diagnosis not present

## 2015-10-07 DIAGNOSIS — M25521 Pain in right elbow: Secondary | ICD-10-CM | POA: Diagnosis not present

## 2015-10-07 DIAGNOSIS — M24562 Contracture, left knee: Secondary | ICD-10-CM | POA: Diagnosis not present

## 2015-10-08 DIAGNOSIS — R41841 Cognitive communication deficit: Secondary | ICD-10-CM | POA: Diagnosis not present

## 2015-10-08 DIAGNOSIS — R1310 Dysphagia, unspecified: Secondary | ICD-10-CM | POA: Diagnosis not present

## 2015-10-08 DIAGNOSIS — M24561 Contracture, right knee: Secondary | ICD-10-CM | POA: Diagnosis not present

## 2015-10-08 DIAGNOSIS — M24562 Contracture, left knee: Secondary | ICD-10-CM | POA: Diagnosis not present

## 2015-10-08 DIAGNOSIS — M25511 Pain in right shoulder: Secondary | ICD-10-CM | POA: Diagnosis not present

## 2015-10-08 DIAGNOSIS — M6281 Muscle weakness (generalized): Secondary | ICD-10-CM | POA: Diagnosis not present

## 2015-10-08 DIAGNOSIS — M179 Osteoarthritis of knee, unspecified: Secondary | ICD-10-CM | POA: Diagnosis not present

## 2015-10-08 DIAGNOSIS — M79641 Pain in right hand: Secondary | ICD-10-CM | POA: Diagnosis not present

## 2015-10-08 DIAGNOSIS — M25521 Pain in right elbow: Secondary | ICD-10-CM | POA: Diagnosis not present

## 2015-10-10 DIAGNOSIS — R41841 Cognitive communication deficit: Secondary | ICD-10-CM | POA: Diagnosis not present

## 2015-10-10 DIAGNOSIS — M6281 Muscle weakness (generalized): Secondary | ICD-10-CM | POA: Diagnosis not present

## 2015-10-10 DIAGNOSIS — R1310 Dysphagia, unspecified: Secondary | ICD-10-CM | POA: Diagnosis not present

## 2015-10-10 DIAGNOSIS — M179 Osteoarthritis of knee, unspecified: Secondary | ICD-10-CM | POA: Diagnosis not present

## 2015-10-10 DIAGNOSIS — M25511 Pain in right shoulder: Secondary | ICD-10-CM | POA: Diagnosis not present

## 2015-10-10 DIAGNOSIS — M24562 Contracture, left knee: Secondary | ICD-10-CM | POA: Diagnosis not present

## 2015-10-10 DIAGNOSIS — M24561 Contracture, right knee: Secondary | ICD-10-CM | POA: Diagnosis not present

## 2015-10-10 DIAGNOSIS — M25521 Pain in right elbow: Secondary | ICD-10-CM | POA: Diagnosis not present

## 2015-10-10 DIAGNOSIS — M79641 Pain in right hand: Secondary | ICD-10-CM | POA: Diagnosis not present

## 2015-10-11 DIAGNOSIS — M179 Osteoarthritis of knee, unspecified: Secondary | ICD-10-CM | POA: Diagnosis not present

## 2015-10-11 DIAGNOSIS — M79641 Pain in right hand: Secondary | ICD-10-CM | POA: Diagnosis not present

## 2015-10-11 DIAGNOSIS — M25521 Pain in right elbow: Secondary | ICD-10-CM | POA: Diagnosis not present

## 2015-10-11 DIAGNOSIS — R1310 Dysphagia, unspecified: Secondary | ICD-10-CM | POA: Diagnosis not present

## 2015-10-11 DIAGNOSIS — R41841 Cognitive communication deficit: Secondary | ICD-10-CM | POA: Diagnosis not present

## 2015-10-11 DIAGNOSIS — M24561 Contracture, right knee: Secondary | ICD-10-CM | POA: Diagnosis not present

## 2015-10-11 DIAGNOSIS — M24562 Contracture, left knee: Secondary | ICD-10-CM | POA: Diagnosis not present

## 2015-10-11 DIAGNOSIS — M6281 Muscle weakness (generalized): Secondary | ICD-10-CM | POA: Diagnosis not present

## 2015-10-11 DIAGNOSIS — M25511 Pain in right shoulder: Secondary | ICD-10-CM | POA: Diagnosis not present

## 2015-10-14 DIAGNOSIS — M79641 Pain in right hand: Secondary | ICD-10-CM | POA: Diagnosis not present

## 2015-10-14 DIAGNOSIS — M24562 Contracture, left knee: Secondary | ICD-10-CM | POA: Diagnosis not present

## 2015-10-14 DIAGNOSIS — M24561 Contracture, right knee: Secondary | ICD-10-CM | POA: Diagnosis not present

## 2015-10-14 DIAGNOSIS — R41841 Cognitive communication deficit: Secondary | ICD-10-CM | POA: Diagnosis not present

## 2015-10-14 DIAGNOSIS — R1310 Dysphagia, unspecified: Secondary | ICD-10-CM | POA: Diagnosis not present

## 2015-10-14 DIAGNOSIS — M25511 Pain in right shoulder: Secondary | ICD-10-CM | POA: Diagnosis not present

## 2015-10-14 DIAGNOSIS — M25521 Pain in right elbow: Secondary | ICD-10-CM | POA: Diagnosis not present

## 2015-10-14 DIAGNOSIS — M179 Osteoarthritis of knee, unspecified: Secondary | ICD-10-CM | POA: Diagnosis not present

## 2015-10-14 DIAGNOSIS — M6281 Muscle weakness (generalized): Secondary | ICD-10-CM | POA: Diagnosis not present

## 2015-10-17 DIAGNOSIS — M24561 Contracture, right knee: Secondary | ICD-10-CM | POA: Diagnosis not present

## 2015-10-17 DIAGNOSIS — M79641 Pain in right hand: Secondary | ICD-10-CM | POA: Diagnosis not present

## 2015-10-17 DIAGNOSIS — M24562 Contracture, left knee: Secondary | ICD-10-CM | POA: Diagnosis not present

## 2015-10-17 DIAGNOSIS — M179 Osteoarthritis of knee, unspecified: Secondary | ICD-10-CM | POA: Diagnosis not present

## 2015-10-17 DIAGNOSIS — R41841 Cognitive communication deficit: Secondary | ICD-10-CM | POA: Diagnosis not present

## 2015-10-17 DIAGNOSIS — R1310 Dysphagia, unspecified: Secondary | ICD-10-CM | POA: Diagnosis not present

## 2015-10-17 DIAGNOSIS — M6281 Muscle weakness (generalized): Secondary | ICD-10-CM | POA: Diagnosis not present

## 2015-10-17 DIAGNOSIS — M25521 Pain in right elbow: Secondary | ICD-10-CM | POA: Diagnosis not present

## 2015-10-17 DIAGNOSIS — M25511 Pain in right shoulder: Secondary | ICD-10-CM | POA: Diagnosis not present

## 2015-10-18 DIAGNOSIS — M6281 Muscle weakness (generalized): Secondary | ICD-10-CM | POA: Diagnosis not present

## 2015-10-18 DIAGNOSIS — M24561 Contracture, right knee: Secondary | ICD-10-CM | POA: Diagnosis not present

## 2015-10-18 DIAGNOSIS — M179 Osteoarthritis of knee, unspecified: Secondary | ICD-10-CM | POA: Diagnosis not present

## 2015-10-18 DIAGNOSIS — M79641 Pain in right hand: Secondary | ICD-10-CM | POA: Diagnosis not present

## 2015-10-18 DIAGNOSIS — M25521 Pain in right elbow: Secondary | ICD-10-CM | POA: Diagnosis not present

## 2015-10-18 DIAGNOSIS — M24562 Contracture, left knee: Secondary | ICD-10-CM | POA: Diagnosis not present

## 2015-10-18 DIAGNOSIS — R1310 Dysphagia, unspecified: Secondary | ICD-10-CM | POA: Diagnosis not present

## 2015-10-18 DIAGNOSIS — M25511 Pain in right shoulder: Secondary | ICD-10-CM | POA: Diagnosis not present

## 2015-10-18 DIAGNOSIS — R41841 Cognitive communication deficit: Secondary | ICD-10-CM | POA: Diagnosis not present

## 2015-10-19 DIAGNOSIS — M24562 Contracture, left knee: Secondary | ICD-10-CM | POA: Diagnosis not present

## 2015-10-19 DIAGNOSIS — R1310 Dysphagia, unspecified: Secondary | ICD-10-CM | POA: Diagnosis not present

## 2015-10-19 DIAGNOSIS — M25521 Pain in right elbow: Secondary | ICD-10-CM | POA: Diagnosis not present

## 2015-10-19 DIAGNOSIS — R41841 Cognitive communication deficit: Secondary | ICD-10-CM | POA: Diagnosis not present

## 2015-10-19 DIAGNOSIS — M24561 Contracture, right knee: Secondary | ICD-10-CM | POA: Diagnosis not present

## 2015-10-19 DIAGNOSIS — M25511 Pain in right shoulder: Secondary | ICD-10-CM | POA: Diagnosis not present

## 2015-10-19 DIAGNOSIS — M179 Osteoarthritis of knee, unspecified: Secondary | ICD-10-CM | POA: Diagnosis not present

## 2015-10-19 DIAGNOSIS — M79641 Pain in right hand: Secondary | ICD-10-CM | POA: Diagnosis not present

## 2015-10-19 DIAGNOSIS — M6281 Muscle weakness (generalized): Secondary | ICD-10-CM | POA: Diagnosis not present

## 2015-10-20 DIAGNOSIS — R1310 Dysphagia, unspecified: Secondary | ICD-10-CM | POA: Diagnosis not present

## 2015-10-20 DIAGNOSIS — M6281 Muscle weakness (generalized): Secondary | ICD-10-CM | POA: Diagnosis not present

## 2015-10-20 DIAGNOSIS — H401134 Primary open-angle glaucoma, bilateral, indeterminate stage: Secondary | ICD-10-CM | POA: Diagnosis not present

## 2015-10-20 DIAGNOSIS — M24562 Contracture, left knee: Secondary | ICD-10-CM | POA: Diagnosis not present

## 2015-10-20 DIAGNOSIS — M25521 Pain in right elbow: Secondary | ICD-10-CM | POA: Diagnosis not present

## 2015-10-20 DIAGNOSIS — E119 Type 2 diabetes mellitus without complications: Secondary | ICD-10-CM | POA: Diagnosis not present

## 2015-10-20 DIAGNOSIS — M25511 Pain in right shoulder: Secondary | ICD-10-CM | POA: Diagnosis not present

## 2015-10-20 DIAGNOSIS — R41841 Cognitive communication deficit: Secondary | ICD-10-CM | POA: Diagnosis not present

## 2015-10-20 DIAGNOSIS — M79641 Pain in right hand: Secondary | ICD-10-CM | POA: Diagnosis not present

## 2015-10-20 DIAGNOSIS — M24561 Contracture, right knee: Secondary | ICD-10-CM | POA: Diagnosis not present

## 2015-10-20 DIAGNOSIS — M179 Osteoarthritis of knee, unspecified: Secondary | ICD-10-CM | POA: Diagnosis not present

## 2015-10-20 DIAGNOSIS — Z961 Presence of intraocular lens: Secondary | ICD-10-CM | POA: Diagnosis not present

## 2015-10-27 DIAGNOSIS — M25511 Pain in right shoulder: Secondary | ICD-10-CM | POA: Diagnosis not present

## 2015-10-27 DIAGNOSIS — M25521 Pain in right elbow: Secondary | ICD-10-CM | POA: Diagnosis not present

## 2015-10-27 DIAGNOSIS — M24562 Contracture, left knee: Secondary | ICD-10-CM | POA: Diagnosis not present

## 2015-10-27 DIAGNOSIS — R1310 Dysphagia, unspecified: Secondary | ICD-10-CM | POA: Diagnosis not present

## 2015-10-27 DIAGNOSIS — M6281 Muscle weakness (generalized): Secondary | ICD-10-CM | POA: Diagnosis not present

## 2015-10-27 DIAGNOSIS — M24561 Contracture, right knee: Secondary | ICD-10-CM | POA: Diagnosis not present

## 2015-10-27 DIAGNOSIS — M179 Osteoarthritis of knee, unspecified: Secondary | ICD-10-CM | POA: Diagnosis not present

## 2015-10-27 DIAGNOSIS — M79641 Pain in right hand: Secondary | ICD-10-CM | POA: Diagnosis not present

## 2015-10-27 DIAGNOSIS — R41841 Cognitive communication deficit: Secondary | ICD-10-CM | POA: Diagnosis not present

## 2015-10-28 DIAGNOSIS — M24561 Contracture, right knee: Secondary | ICD-10-CM | POA: Diagnosis not present

## 2015-10-28 DIAGNOSIS — R1310 Dysphagia, unspecified: Secondary | ICD-10-CM | POA: Diagnosis not present

## 2015-10-28 DIAGNOSIS — M79641 Pain in right hand: Secondary | ICD-10-CM | POA: Diagnosis not present

## 2015-10-28 DIAGNOSIS — M25521 Pain in right elbow: Secondary | ICD-10-CM | POA: Diagnosis not present

## 2015-10-28 DIAGNOSIS — M179 Osteoarthritis of knee, unspecified: Secondary | ICD-10-CM | POA: Diagnosis not present

## 2015-10-28 DIAGNOSIS — M25511 Pain in right shoulder: Secondary | ICD-10-CM | POA: Diagnosis not present

## 2015-10-28 DIAGNOSIS — R41841 Cognitive communication deficit: Secondary | ICD-10-CM | POA: Diagnosis not present

## 2015-10-28 DIAGNOSIS — M6281 Muscle weakness (generalized): Secondary | ICD-10-CM | POA: Diagnosis not present

## 2015-10-28 DIAGNOSIS — M24562 Contracture, left knee: Secondary | ICD-10-CM | POA: Diagnosis not present

## 2015-10-29 ENCOUNTER — Encounter: Payer: Self-pay | Admitting: Adult Health

## 2015-10-29 DIAGNOSIS — M25521 Pain in right elbow: Secondary | ICD-10-CM | POA: Diagnosis not present

## 2015-10-29 DIAGNOSIS — M179 Osteoarthritis of knee, unspecified: Secondary | ICD-10-CM | POA: Diagnosis not present

## 2015-10-29 DIAGNOSIS — M79641 Pain in right hand: Secondary | ICD-10-CM | POA: Diagnosis not present

## 2015-10-29 DIAGNOSIS — M24562 Contracture, left knee: Secondary | ICD-10-CM | POA: Diagnosis not present

## 2015-10-29 DIAGNOSIS — R41841 Cognitive communication deficit: Secondary | ICD-10-CM | POA: Diagnosis not present

## 2015-10-29 DIAGNOSIS — M25511 Pain in right shoulder: Secondary | ICD-10-CM | POA: Diagnosis not present

## 2015-10-29 DIAGNOSIS — M24561 Contracture, right knee: Secondary | ICD-10-CM | POA: Diagnosis not present

## 2015-10-29 DIAGNOSIS — R1310 Dysphagia, unspecified: Secondary | ICD-10-CM | POA: Diagnosis not present

## 2015-10-29 DIAGNOSIS — M6281 Muscle weakness (generalized): Secondary | ICD-10-CM | POA: Diagnosis not present

## 2015-10-29 MED ORDER — LISINOPRIL 5 MG PO TABS: 2.5000 mg | ORAL_TABLET | Freq: Every day | ORAL | Status: DC

## 2015-10-29 MED ORDER — COLCHICINE 0.6 MG PO TABS: 0.6000 mg | ORAL_TABLET | Freq: Every day | ORAL | Status: DC | PRN

## 2015-10-29 NOTE — Progress Notes (Signed)
Patient ID: Shari Mcmahon, female   DOB: April 08, 1927, 81 y.o.   MRN: [id number]   Facility: [facility]      Allergies  Allergen Reactions  . Voltaren [Diclofenac Sodium] Other (See Comments)    Stomach irritation  . Dulcolax [Bisacodyl]     Unknown   . Duloxetine Hcl Other (See Comments)    Made patient not want to eat or drink  . Penicillins Swelling    Unknown   . Timolol     REACTION: bradycardia worse    Chief Complaint  Patient presents with  . Acute Visit    right hand pain     HPI:  Staff reports that she is having right hand pain and swelling. She has a history of gout present. Her knuckles are swollen. There are no reports of fever present. Her blood pressure reading is low and will need an adjustment of her blood pressure medication.    Past Medical History  Diagnosis Date  . ABDOMINAL PAIN, CHRONIC 04/07/2008  . ARTHRITIS 03/03/2008  . BRADYCARDIA, CHRONIC 12/17/2008  . CARDIAC MURMUR 02/01/2010  . CONSTIPATION, CHRONIC 10/15/2010  . DEGENERATIVE JOINT DISEASE, KNEES, BILATERAL 12/07/2007  . DEPRESSION 06/17/2009  . DIABETES MELLITUS, TYPE II 09/19/2007    DIET CONTROL   . DYSPHAGIA UNSPECIFIED 12/24/2009  . Gastroparesis 12/24/2009  . GENERALIZED OSTEOARTHROSIS INVOLVING HAND 10/03/2007  . GLAUCOMA 09/19/2007  . GOUTY ARTHROPATHY UNSPECIFIED 02/17/2009  . GOUT 09/19/2007  . HYPERLIPIDEMIA 01/27/2010  . HYPERTENSION 09/19/2007  . LUNG NODULE 03/03/2008  . MENOPAUSAL DISORDER 12/17/2008  . PERIPHERAL NEUROPATHY 06/19/2008  . PERSONAL HISTORY MALIGNANT NEOPLASM STOMACH 09/19/2007  . 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 STOMACH CANCER 03/03/2008  . UTI 12/15/2009  . Arthritis of knee, degenerative 10/19/2011    Past Surgical History  Procedure Laterality Date  . Vagotomy    . Partial gastrectomy    . Billroth ii gastorenterostomy    . Colonoscopy      VITAL SIGNS BP 92/50 mmHg  Pulse 68  Ht 5\' 5"  (1.651 m)  Wt 128 lb 3.2 oz (58.151  kg)  BMI 21.33 kg/m2  Patient's Medications  New Prescriptions   No medications on file  Previous Medications   ACETAMINOPHEN (TYLENOL) 500 MG TABLET    Take 500 mg by mouth every 6 (six) hours as needed for mild pain or moderate pain.    ALLOPURINOL (ZYLOPRIM) 100 MG TABLET    Take 100 mg by mouth daily.   AMINO ACIDS-PROTEIN HYDROLYS (FEEDING SUPPLEMENT, PRO-STAT SUGAR FREE 64,) LIQD    Take 30 mLs by mouth 2 (two) times daily.   DIVALPROEX (DEPAKOTE SPRINKLES) 125 MG CAPSULE    Take 1 capsule (125 mg total) by mouth 2 (two) times daily.   DONEPEZIL (ARICEPT) 5 MG TABLET    Take 5 mg by mouth at bedtime.   FEBUXOSTAT (ULORIC) 40 MG TABLET    Take 40 mg by mouth daily.   HYDROCODONE-ACETAMINOPHEN (NORCO/VICODIN) 5-325 MG TABLET    Take one tablet by mouth every 8 hours for pain. Not to exceed 3000mg  of APAP form all sources/24hr   INSULIN ASPART (NOVOLOG) 100 UNIT/ML INJECTION    Inject 3 Units into the skin 3 (three) times daily before meals. For cbg >=150   LATANOPROST (XALATAN) 0.005 % OPHTHALMIC SOLUTION    Place 1 drop into both eyes at bedtime.    LISINOPRIL (PRINIVIL,ZESTRIL) 5 MG TABLET    Take 5 mg by mouth daily.  MIRTAZAPINE (REMERON) 7.5 MG TABLET    Take 7.5 mg by mouth at bedtime.   MULTIPLE VITAMIN TABLET    Take 1 tablet by mouth daily.   OMEPRAZOLE (PRILOSEC) 40 MG CAPSULE    Take 1 capsule (40 mg total) by mouth daily.   PRAVASTATIN (PRAVACHOL) 10 MG TABLET    TAKE 1 TABLET BY MOUTH EVERY EVENING.   SENNA (SENOKOT) 8.6 MG TABLET    Take 1 tablet by mouth 2 (two) times daily. Reported on 10/29/2015  Modified Medications   No medications on file  Discontinued Medications     SIGNIFICANT DIAGNOSTIC EXAMS  08-25-15: right shoulder x-ray: mild osteoarthritis    LABS REVIEWED:   08-26-15: uric acid: 1.6 09-04-15: urine culture: proteus mirabilis: cipro     Review of Systems  Unable to perform ROS: dementia     Physical Exam  Constitutional: No distress.    Eyes: Conjunctivae are normal.  Neck: Neck supple. No JVD present. No thyromegaly present.  Has carotid bruit  Cardiovascular: Normal rate, regular rhythm and intact distal pulses.   Murmur heard. 2/6  Respiratory: Effort normal and breath sounds normal. No respiratory distress. She has no wheezes.  GI: Soft. Bowel sounds are normal. She exhibits no distension. There is no tenderness.  Musculoskeletal: She exhibits no edema.  Able to move all extremities  Right hand: 3-5th knuckles are are red hot and swollen.   Lymphadenopathy:    She has no cervical adenopathy.  Neurological: She is alert.  Skin: Skin is warm and dry. She is not diaphoretic.  Psychiatric: She has a normal mood and affect.      ASSESSMENT/ PLAN:  1. Hypertension: will reduce her lisinopril to 2.5 mg daily will monitor  2. Gout: will stop allopurinol; will begin colchicine 0.6 mg daily for one week then daily as needed; will get an x-ray of her right hand   Will check cbc; cmp; lipids; hgb a1c        Ok Edwards NP [facility]  Contact [phone] Monday through Friday 8am- 5pm  After hours call [phone]

## 2015-10-29 NOTE — Progress Notes (Signed)
Patient ID: Shari Mcmahon, female   DOB: [birth year], 81 y.o.   MRN: [id number]    Facility: [facility]      Allergies  Allergen Reactions  . Voltaren [Diclofenac Sodium] Other (See Comments)    Stomach irritation  . Dulcolax [Bisacodyl]     Unknown   . Duloxetine Hcl Other (See Comments)    Made patient not want to eat or drink  . Penicillins Swelling    Unknown   . Timolol     REACTION: bradycardia worse    Chief Complaint  Patient presents with  . Medical Management of Chronic Issues    HPI:  She is a long term resident of this facility being seen for the management of her chronic illnesses. Overall there is little change in her status. She is unable to fully participate in the hpi or ros.  Her blood pressure readings are slightly low and will need to be lowered. There are no nursing concerns at this time.    Past Medical History  Diagnosis Date  . ABDOMINAL PAIN, CHRONIC 04/07/2008  . ARTHRITIS 03/03/2008  . BRADYCARDIA, CHRONIC 12/17/2008  . CARDIAC MURMUR 02/01/2010  . CONSTIPATION, CHRONIC 10/15/2010  . DEGENERATIVE JOINT DISEASE, KNEES, BILATERAL 12/07/2007  . DEPRESSION 06/17/2009  . DIABETES MELLITUS, TYPE II 09/19/2007    DIET CONTROL   . DYSPHAGIA UNSPECIFIED 12/24/2009  . Gastroparesis 12/24/2009  . GENERALIZED OSTEOARTHROSIS INVOLVING HAND 10/03/2007  . GLAUCOMA 09/19/2007  . GOUTY ARTHROPATHY UNSPECIFIED 02/17/2009  . GOUT 09/19/2007  . HYPERLIPIDEMIA 01/27/2010  . HYPERTENSION 09/19/2007  . LUNG NODULE 03/03/2008  . MENOPAUSAL DISORDER 12/17/2008  . PERIPHERAL NEUROPATHY 06/19/2008  . PERSONAL HISTORY MALIGNANT NEOPLASM STOMACH 09/19/2007  . 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 STOMACH CANCER 03/03/2008  . UTI 12/15/2009  . Arthritis of knee, degenerative 10/19/2011    Past Surgical History  Procedure Laterality Date  . Vagotomy    . Partial gastrectomy    . Billroth ii gastorenterostomy    . Colonoscopy      VITAL SIGNS BP  92/50 mmHg  Pulse 68  Ht 5\' 5"  (1.651 m)  Wt 128 lb 3.2 oz (58.151 kg)  BMI 21.33 kg/m2  Patient's Medications  New Prescriptions   No medications on file  Previous Medications   ACETAMINOPHEN (TYLENOL) 500 MG TABLET    Take 500 mg by mouth every 6 (six) hours as needed for mild pain or moderate pain.    AMINO ACIDS-PROTEIN HYDROLYS (FEEDING SUPPLEMENT, PRO-STAT SUGAR FREE 64,) LIQD    Take 30 mLs by mouth 2 (two) times daily.   COLCHICINE 0.6 MG TABLET    Take 1 tablet (0.6 mg total) by mouth daily as needed.   DIVALPROEX (DEPAKOTE SPRINKLES) 125 MG CAPSULE    Take 1 capsule (125 mg total) by mouth 2 (two) times daily.   DONEPEZIL (ARICEPT) 5 MG TABLET    Take 5 mg by mouth at bedtime.   FEBUXOSTAT (ULORIC) 40 MG TABLET    Take 40 mg by mouth daily.   HYDROCODONE-ACETAMINOPHEN (NORCO/VICODIN) 5-325 MG TABLET    Take one tablet by mouth every 8 hours for pain. Not to exceed 3000mg  of APAP form all sources/24hr   INSULIN ASPART (NOVOLOG) 100 UNIT/ML INJECTION    Inject 3 Units into the skin 3 (three) times daily before meals. For cbg >=150   LATANOPROST (XALATAN) 0.005 % OPHTHALMIC SOLUTION    Place 1 drop into both eyes at bedtime.  LISINOPRIL (PRINIVIL,ZESTRIL) 5 MG TABLET    Take 5 mg  by mouth daily.   MIRTAZAPINE (REMERON) 7.5 MG TABLET    Take 7.5 mg by mouth at bedtime.   MULTIPLE VITAMIN TABLET    Take 1 tablet by mouth daily.   OMEPRAZOLE (PRILOSEC) 40 MG CAPSULE    Take 1 capsule (40 mg total) by mouth daily.   PRAVASTATIN (PRAVACHOL) 10 MG TABLET    TAKE 1 TABLET BY MOUTH EVERY EVENING.   SENNA (SENOKOT) 8.6 MG TABLET    Take 1 tablet by mouth 2 (two) times daily.  Modified Medications   No medications on file  Discontinued Medications   No medications on file     SIGNIFICANT DIAGNOSTIC EXAMS   08-25-15: right shoulder x-ray: mild osteoarthritis  09-21-15: right hand x-ray: negative     LABS REVIEWED:   08-26-15: uric acid: 1.6 09-04-15: urine culture: proteus  mirabilis: cipro  09-23-15: wbc 10.5; hgb 7.4; hct 23.3; mcv 98.7 plt 578; glucose 71; bun 17; creat 0.78; k+ 4.4; na++142; liver normal albumin 1.9; chol 91; ldl 40; trig 77; hdl 36; hgb a1c 5.4     Review of Systems  Unable to perform ROS: dementia     Physical Exam  Constitutional: No distress.  Eyes: Conjunctivae are normal.  Neck: Neck supple. No JVD present. No thyromegaly present.  Has carotid bruit  Cardiovascular: Normal rate, regular rhythm and intact distal pulses.   Murmur heard. 2/6  Respiratory: Effort normal and breath sounds normal. No respiratory distress. She has no wheezes.  GI: Soft. Bowel sounds are normal. She exhibits no distension. There is no tenderness.  Musculoskeletal: She exhibits no edema.  Able to move all extremities  Right hand: 3-5th knuckles are are red hot and swollen.   Lymphadenopathy:    She has no cervical adenopathy.  Neurological: She is alert.  Skin: Skin is warm and dry. She is not diaphoretic.  Psychiatric: She has a normal mood and affect.      ASSESSMENT/ PLAN:  1. Hypertension: will reduce her lisinopril to 2.5 mg daily will monitor  2. Gout: will stop allopurinol; will continue colchicine 0.6 mg daily  as needed; uloric 40 mg daily   3. Dyslipidemia: will continue pravachol 10 mg daily her ldl is 40  4. Alzheimer's disease: no significant change in status; will continue aricept 5 mg nightly will continue depakote sprinkles 125 mg twice daily for mood stabilization   5. Weight loss: will continue remeron 7.5 mg nightly her weight is 128.2 pounds  6. Jerrye Bushy: will continue prilosec 20 mg daily   7. Constipation: will continue senna twice daily   8. Diabetes: is diet controlled; is presently not on medications; her hgb a1c is 5.4  9. Anemia: her hgb is 7.4; will check iron studies and will guaiac her stool  10. Glaucoma: will continue latanoprost to both eyes nightly    Will check urine for micro-albumin            Ok Edwards NP [facility]  Contact [phone] Monday through Friday 8am- 5pm  After hours call [phone]

## 2015-10-30 DIAGNOSIS — L89620 Pressure ulcer of left heel, unstageable: Secondary | ICD-10-CM | POA: Diagnosis not present

## 2015-10-30 DIAGNOSIS — K219 Gastro-esophageal reflux disease without esophagitis: Secondary | ICD-10-CM

## 2015-10-30 HISTORY — DX: Gastro-esophageal reflux disease without esophagitis: K21.9

## 2015-11-04 ENCOUNTER — Non-Acute Institutional Stay (SKILLED_NURSING_FACILITY): Payer: Medicare Other | Admitting: Adult Health

## 2015-11-04 DIAGNOSIS — D500 Iron deficiency anemia secondary to blood loss (chronic): Secondary | ICD-10-CM | POA: Diagnosis not present

## 2015-11-04 DIAGNOSIS — F0280 Dementia in other diseases classified elsewhere without behavioral disturbance: Secondary | ICD-10-CM | POA: Diagnosis not present

## 2015-11-04 DIAGNOSIS — K219 Gastro-esophageal reflux disease without esophagitis: Secondary | ICD-10-CM | POA: Diagnosis not present

## 2015-11-04 DIAGNOSIS — I10 Essential (primary) hypertension: Secondary | ICD-10-CM | POA: Diagnosis not present

## 2015-11-04 DIAGNOSIS — M10041 Idiopathic gout, right hand: Secondary | ICD-10-CM | POA: Diagnosis not present

## 2015-11-04 DIAGNOSIS — E785 Hyperlipidemia, unspecified: Secondary | ICD-10-CM | POA: Diagnosis not present

## 2015-11-04 DIAGNOSIS — G309 Alzheimer's disease, unspecified: Secondary | ICD-10-CM | POA: Diagnosis not present

## 2015-11-04 DIAGNOSIS — R634 Abnormal weight loss: Secondary | ICD-10-CM

## 2015-11-04 DIAGNOSIS — E1143 Type 2 diabetes mellitus with diabetic autonomic (poly)neuropathy: Secondary | ICD-10-CM | POA: Diagnosis not present

## 2015-11-04 DIAGNOSIS — H409 Unspecified glaucoma: Secondary | ICD-10-CM | POA: Diagnosis not present

## 2015-11-04 DIAGNOSIS — K3184 Gastroparesis: Secondary | ICD-10-CM

## 2015-11-06 DIAGNOSIS — Z9181 History of falling: Secondary | ICD-10-CM | POA: Diagnosis not present

## 2015-11-06 DIAGNOSIS — M109 Gout, unspecified: Secondary | ICD-10-CM | POA: Diagnosis not present

## 2015-11-13 DIAGNOSIS — S9002XD Contusion of left ankle, subsequent encounter: Secondary | ICD-10-CM | POA: Diagnosis not present

## 2015-11-13 DIAGNOSIS — L89624 Pressure ulcer of left heel, stage 4: Secondary | ICD-10-CM | POA: Diagnosis not present

## 2015-11-15 ENCOUNTER — Encounter: Payer: Self-pay | Admitting: Adult Health

## 2015-11-15 DIAGNOSIS — E1143 Type 2 diabetes mellitus with diabetic autonomic (poly)neuropathy: Secondary | ICD-10-CM | POA: Insufficient documentation

## 2015-11-15 HISTORY — DX: Type 2 diabetes mellitus with diabetic autonomic (poly)neuropathy: E11.43

## 2015-11-15 MED ORDER — GABAPENTIN 100 MG PO CAPS: 100.0000 mg | ORAL_CAPSULE | Freq: Every day | ORAL | Status: DC

## 2015-11-15 MED ORDER — PRO-STAT SUGAR FREE PO LIQD: 30.0000 mL | Freq: Three times a day (TID) | ORAL | Status: DC

## 2015-11-15 NOTE — Progress Notes (Signed)
Patient ID: Shari Mcmahon, female   DOB: [birth year], 82 y.o.   MRN: [id number]   Facility: [facility]      Allergies  Allergen Reactions  . Voltaren [Diclofenac Sodium] Other (See Comments)    Stomach irritation  . Dulcolax [Bisacodyl]     Unknown   . Duloxetine Hcl Other (See Comments)    Made patient not want to eat or drink  . Penicillins Swelling    Unknown   . Timolol     REACTION: bradycardia worse    Chief Complaint  Patient presents with  . Medical Management of Chronic Issues    HPI:  She is a long term resident of this facility being seen for the management of her chronic illnesses. I have been asked to evaluate her wounds as well. She has a pressure area on her bilateral heels and sacral area.  She cannot fully participate in the hpi or ros; but is complaining of burning and tingling in her feet and state that they hurt all the time.    Past Medical History  Diagnosis Date  . ABDOMINAL PAIN, CHRONIC 04/07/2008  . ARTHRITIS 03/03/2008  . BRADYCARDIA, CHRONIC 12/17/2008  . CARDIAC MURMUR 02/01/2010  . CONSTIPATION, CHRONIC 10/15/2010  . DEGENERATIVE JOINT DISEASE, KNEES, BILATERAL 12/07/2007  . DEPRESSION 06/17/2009  . DIABETES MELLITUS, TYPE II 09/19/2007    DIET CONTROL   . DYSPHAGIA UNSPECIFIED 12/24/2009  . Gastroparesis 12/24/2009  . GENERALIZED OSTEOARTHROSIS INVOLVING HAND 10/03/2007  . GLAUCOMA 09/19/2007  . GOUTY ARTHROPATHY UNSPECIFIED 02/17/2009  . GOUT 09/19/2007  . HYPERLIPIDEMIA 01/27/2010  . HYPERTENSION 09/19/2007  . LUNG NODULE 03/03/2008  . MENOPAUSAL DISORDER 12/17/2008  . PERIPHERAL NEUROPATHY 06/19/2008  . PERSONAL HISTORY MALIGNANT NEOPLASM STOMACH 09/19/2007  . 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 STOMACH CANCER 03/03/2008  . UTI 12/15/2009  . Arthritis of knee, degenerative 10/19/2011    Past Surgical History  Procedure Laterality Date  . Vagotomy    . Partial gastrectomy    . Billroth ii gastorenterostomy    .  Colonoscopy      VITAL SIGNS BP 129/71 mmHg  Pulse 61  Ht 5\' 5"  (1.651 m)  Wt 139 lb (63.05 kg)  BMI 23.13 kg/m2  Patient's Medications  New Prescriptions   No medications on file  Previous Medications   ACETAMINOPHEN (TYLENOL) 500 MG TABLET    Take 500 mg by mouth every 6 (six) hours as needed for mild pain or moderate pain.    ALLOPURINOL (ZYLOPRIM) 100 MG TABLET    Take 100 mg by mouth daily.   AMINO ACIDS-PROTEIN HYDROLYS (FEEDING SUPPLEMENT, PRO-STAT SUGAR FREE 64,) LIQD    Take 30 mLs by mouth 2 (two) times daily.   COLCHICINE 0.6 MG TABLET    Take 1 tablet (0.6 mg total) by mouth daily as needed.   DIVALPROEX (DEPAKOTE SPRINKLES) 125 MG CAPSULE    Take 1 capsule (125 mg total) by mouth 2 (two) times daily.   DONEPEZIL (ARICEPT) 5 MG TABLET    Take 5 mg by mouth at bedtime.   FEBUXOSTAT (ULORIC) 40 MG TABLET    Take 40 mg by mouth daily.   HYDROCODONE-ACETAMINOPHEN (NORCO/VICODIN) 5-325 MG TABLET    Take one tablet by mouth every 8 hours for pain. Not to exceed 3000mg  of APAP form all sources/24hr   INSULIN ASPART (NOVOLOG) 100 UNIT/ML INJECTION    Inject 3 Units into the skin 3 (three) times daily before meals. For cbg >=150  LATANOPROST (XALATAN) 0.005 % OPHTHALMIC SOLUTION    Place 1 drop into both eyes at bedtime.    LISINOPRIL (PRINIVIL,ZESTRIL) 5 MG TABLET    Take 0.5 tablets (2.5 mg total) by mouth daily.   MIRTAZAPINE (REMERON) 7.5 MG TABLET    Take 7.5 mg by mouth at bedtime.   MULTIPLE VITAMIN TABLET    Take 1 tablet by mouth daily.   OMEPRAZOLE (PRILOSEC) 40 MG CAPSULE    Take 1 capsule (40 mg total) by mouth daily.   PRAVASTATIN (PRAVACHOL) 10 MG TABLET    TAKE 1 TABLET BY MOUTH EVERY EVENING.   SENNA (SENOKOT) 8.6 MG TABLET    Take 1 tablet by mouth 2 (two) times daily.   Modified Medications   No medications on file  Discontinued Medications   No medications on file     SIGNIFICANT DIAGNOSTIC EXAMS   08-25-15: right shoulder x-ray: mild  osteoarthritis  09-21-15: right hand x-ray: negative     LABS REVIEWED:   08-26-15: uric acid: 1.6 09-04-15: urine culture: proteus mirabilis: cipro  09-23-15: wbc 10.5; hgb 7.4; hct 23.3; mcv 98.7 plt 578; glucose 71; bun 17; creat 0.78; k+ 4.4; na++142; liver normal albumin 1.9; chol 91; ldl 40; trig 77; hdl 36; hgb a1c 5.4     Review of Systems  Unable to perform ROS: dementia     Physical Exam  Constitutional: No distress.  Eyes: Conjunctivae are normal.  Neck: Neck supple. No JVD present. No thyromegaly present.  Has carotid bruit  Cardiovascular: Normal rate, regular rhythm and intact distal pulses.   Murmur heard. 2/6  Respiratory: Effort normal and breath sounds normal. No respiratory distress. She has no wheezes.  GI: Soft. Bowel sounds are normal. She exhibits no distension. There is no tenderness.  Musculoskeletal: She exhibits no edema.  Able to move all extremities    Lymphadenopathy:    She has no cervical adenopathy.  Neurological: She is alert.  Skin: Skin is warm and dry. She is not diaphoretic.  Left heel unstaged: slough is present 3.0 x 1.5 santyl being used daily  Right heel and toes and bunion with deep tissue injury  Psychiatric: She has a normal mood and affect.      ASSESSMENT/ PLAN:  1. Hypertension: will continue  lisinopril  2.5 mg daily will monitor  2. Gout:  will continue colchicine 0.6 mg daily  as needed; uloric 40 mg daily; allopurinol 100 mg daily   3. Dyslipidemia: will stop her pravachol at this time her ldl is 40   4. Alzheimer's disease: no significant change in status; will continue aricept 5 mg nightly will continue depakote sprinkles 125 mg twice daily for mood stabilization   5. Weight loss: will continue remeron 7.5 mg nightly her weight is 139 pounds; will increase prostat to 30 cc three times daily for albumin 1.9   6. Gerd: will continue prilosec 40 mg daily   7. Constipation: will continue senna twice daily   8.  Diabetes: is diet controlled; is presently not on medications; her hgb a1c is 5.4  9. Anemia: her hgb is 7.4; will check iron studies and will guaiac her stool  10. Glaucoma: will continue latanoprost to both eyes nightly   11. Left and right heel pressure areas: will continue current treatment and will monitor   12. Peripheral neuropathy: will begin neurontin 100 mg nightly for her pain and will monitor her status.       Ok Edwards NP [facility]  Contact [phone] Monday through Friday 8am- 5pm  After hours call [phone]

## 2015-11-20 ENCOUNTER — Non-Acute Institutional Stay (SKILLED_NURSING_FACILITY): Payer: Medicare Other | Admitting: Adult Health

## 2015-11-20 DIAGNOSIS — E1143 Type 2 diabetes mellitus with diabetic autonomic (poly)neuropathy: Secondary | ICD-10-CM

## 2015-11-20 DIAGNOSIS — I10 Essential (primary) hypertension: Secondary | ICD-10-CM | POA: Diagnosis not present

## 2015-11-23 DIAGNOSIS — L89620 Pressure ulcer of left heel, unstageable: Secondary | ICD-10-CM | POA: Diagnosis not present

## 2015-11-27 DIAGNOSIS — L89624 Pressure ulcer of left heel, stage 4: Secondary | ICD-10-CM | POA: Diagnosis not present

## 2015-11-27 DIAGNOSIS — S20219D Contusion of unspecified front wall of thorax, subsequent encounter: Secondary | ICD-10-CM | POA: Diagnosis not present

## 2015-12-04 DIAGNOSIS — L89890 Pressure ulcer of other site, unstageable: Secondary | ICD-10-CM | POA: Diagnosis not present

## 2015-12-04 DIAGNOSIS — L89153 Pressure ulcer of sacral region, stage 3: Secondary | ICD-10-CM | POA: Diagnosis not present

## 2015-12-07 DIAGNOSIS — M109 Gout, unspecified: Secondary | ICD-10-CM | POA: Diagnosis not present

## 2015-12-07 DIAGNOSIS — Z9181 History of falling: Secondary | ICD-10-CM | POA: Diagnosis not present

## 2015-12-11 DIAGNOSIS — L89624 Pressure ulcer of left heel, stage 4: Secondary | ICD-10-CM | POA: Diagnosis not present

## 2015-12-11 DIAGNOSIS — L89153 Pressure ulcer of sacral region, stage 3: Secondary | ICD-10-CM | POA: Diagnosis not present

## 2015-12-11 DIAGNOSIS — L89890 Pressure ulcer of other site, unstageable: Secondary | ICD-10-CM | POA: Diagnosis not present

## 2015-12-15 DIAGNOSIS — M1711 Unilateral primary osteoarthritis, right knee: Secondary | ICD-10-CM | POA: Diagnosis not present

## 2015-12-15 DIAGNOSIS — M170 Bilateral primary osteoarthritis of knee: Secondary | ICD-10-CM | POA: Diagnosis not present

## 2015-12-15 DIAGNOSIS — M1712 Unilateral primary osteoarthritis, left knee: Secondary | ICD-10-CM | POA: Diagnosis not present

## 2015-12-18 DIAGNOSIS — L89153 Pressure ulcer of sacral region, stage 3: Secondary | ICD-10-CM | POA: Diagnosis not present

## 2015-12-18 DIAGNOSIS — L89624 Pressure ulcer of left heel, stage 4: Secondary | ICD-10-CM | POA: Diagnosis not present

## 2015-12-18 DIAGNOSIS — L97219 Non-pressure chronic ulcer of right calf with unspecified severity: Secondary | ICD-10-CM | POA: Diagnosis not present

## 2015-12-25 DIAGNOSIS — M79671 Pain in right foot: Secondary | ICD-10-CM | POA: Diagnosis not present

## 2015-12-25 DIAGNOSIS — L89620 Pressure ulcer of left heel, unstageable: Secondary | ICD-10-CM | POA: Diagnosis not present

## 2015-12-25 DIAGNOSIS — M79672 Pain in left foot: Secondary | ICD-10-CM | POA: Diagnosis not present

## 2015-12-25 DIAGNOSIS — L89624 Pressure ulcer of left heel, stage 4: Secondary | ICD-10-CM | POA: Diagnosis not present

## 2015-12-25 DIAGNOSIS — I739 Peripheral vascular disease, unspecified: Secondary | ICD-10-CM | POA: Diagnosis not present

## 2015-12-25 DIAGNOSIS — B351 Tinea unguium: Secondary | ICD-10-CM | POA: Diagnosis not present

## 2015-12-25 DIAGNOSIS — L97213 Non-pressure chronic ulcer of right calf with necrosis of muscle: Secondary | ICD-10-CM | POA: Diagnosis not present

## 2015-12-29 DIAGNOSIS — L89153 Pressure ulcer of sacral region, stage 3: Secondary | ICD-10-CM | POA: Diagnosis not present

## 2015-12-29 DIAGNOSIS — S81801D Unspecified open wound, right lower leg, subsequent encounter: Secondary | ICD-10-CM | POA: Diagnosis not present

## 2015-12-29 DIAGNOSIS — L89620 Pressure ulcer of left heel, unstageable: Secondary | ICD-10-CM | POA: Diagnosis not present

## 2015-12-31 ENCOUNTER — Non-Acute Institutional Stay (SKILLED_NURSING_FACILITY): Payer: Medicare Other | Admitting: Adult Health

## 2015-12-31 ENCOUNTER — Encounter: Payer: Self-pay | Admitting: Adult Health

## 2015-12-31 DIAGNOSIS — G309 Alzheimer's disease, unspecified: Secondary | ICD-10-CM | POA: Diagnosis not present

## 2015-12-31 DIAGNOSIS — E1143 Type 2 diabetes mellitus with diabetic autonomic (poly)neuropathy: Secondary | ICD-10-CM | POA: Diagnosis not present

## 2015-12-31 DIAGNOSIS — K219 Gastro-esophageal reflux disease without esophagitis: Secondary | ICD-10-CM | POA: Diagnosis not present

## 2015-12-31 DIAGNOSIS — F0280 Dementia in other diseases classified elsewhere without behavioral disturbance: Secondary | ICD-10-CM | POA: Diagnosis not present

## 2015-12-31 DIAGNOSIS — L8990 Pressure ulcer of unspecified site, unspecified stage: Secondary | ICD-10-CM

## 2015-12-31 DIAGNOSIS — I10 Essential (primary) hypertension: Secondary | ICD-10-CM | POA: Diagnosis not present

## 2015-12-31 DIAGNOSIS — E1151 Type 2 diabetes mellitus with diabetic peripheral angiopathy without gangrene: Secondary | ICD-10-CM

## 2015-12-31 NOTE — Progress Notes (Signed)
Patient ID: Shari Mcmahon, female   DOB: [birth year], 82 y.o.   MRN: [id number]   Facility: [facility]       Allergies  Allergen Reactions  . Voltaren [Diclofenac Sodium] Other (See Comments)    Stomach irritation  . Dulcolax [Bisacodyl]     Unknown   . Duloxetine Hcl Other (See Comments)    Made patient not want to eat or drink  . Penicillins Swelling    Unknown   . Timolol     REACTION: bradycardia worse    Chief Complaint  Patient presents with  . Medical Management of Chronic Issues    Follow up    HPI:  She is a long term resident of this facility being seen for the management of her chronic illnesses. She has multiple ulcerations. She does have necrotic ares on both both feet. She does have neuropathic pain present to both feet; but is unable to fully participate in the hpi or ros.    Past Medical History  Diagnosis Date  . ABDOMINAL PAIN, CHRONIC 04/07/2008  . ARTHRITIS 03/03/2008  . BRADYCARDIA, CHRONIC 12/17/2008  . CARDIAC MURMUR 02/01/2010  . CONSTIPATION, CHRONIC 10/15/2010  . DEGENERATIVE JOINT DISEASE, KNEES, BILATERAL 12/07/2007  . DEPRESSION 06/17/2009  . DIABETES MELLITUS, TYPE II 09/19/2007    DIET CONTROL   . DYSPHAGIA UNSPECIFIED 12/24/2009  . Gastroparesis 12/24/2009  . GENERALIZED OSTEOARTHROSIS INVOLVING HAND 10/03/2007  . GLAUCOMA 09/19/2007  . GOUTY ARTHROPATHY UNSPECIFIED 02/17/2009  . GOUT 09/19/2007  . HYPERLIPIDEMIA 01/27/2010  . HYPERTENSION 09/19/2007  . LUNG NODULE 03/03/2008  . MENOPAUSAL DISORDER 12/17/2008  . PERIPHERAL NEUROPATHY 06/19/2008  . PERSONAL HISTORY MALIGNANT NEOPLASM STOMACH 09/19/2007  . 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 STOMACH CANCER 03/03/2008  . UTI 12/15/2009  . Arthritis of knee, degenerative 10/19/2011    Past Surgical History  Procedure Laterality Date  . Vagotomy    . Partial gastrectomy    . Billroth ii gastorenterostomy    . Colonoscopy      VITAL SIGNS BP 121/66 mmHg  Pulse 70   Temp(Src) 97.8 F (36.6 C) (Oral)  Resp 18  Ht 5\' 5"  (1.651 m)  Wt 138 lb 2 oz (62.653 kg)  BMI 22.99 kg/m2  SpO2 96%  Patient's Medications  New Prescriptions   No medications on file  Previous Medications   AMINO ACIDS-PROTEIN HYDROLYS (FEEDING SUPPLEMENT, PRO-STAT SUGAR FREE 64,) LIQD    Take 30 mLs by mouth 3 (three) times daily with meals.   COLCHICINE 0.6 MG TABLET    Take 1 tablet (0.6 mg total) by mouth daily as needed.   DIVALPROEX (DEPAKOTE SPRINKLES) 125 MG CAPSULE    Take 1 capsule (125 mg total) by mouth 2 (two) times daily.   DONEPEZIL (ARICEPT) 5 MG TABLET    Take 5 mg by mouth at bedtime.   FEBUXOSTAT (ULORIC) 40 MG TABLET    Take 40 mg by mouth daily.   HYDROCODONE-ACETAMINOPHEN (NORCO/VICODIN) 5-325 MG TABLET    Take one tablet by mouth every 8 hours for pain. Not to exceed 3000mg  of APAP form all sources/24hr   INSULIN ASPART (NOVOLOG) 100 UNIT/ML INJECTION    Inject 3 Units into the skin 3 (three) times daily before meals. For cbg >=150   LATANOPROST (XALATAN) 0.005 % OPHTHALMIC SOLUTION    Place 1 drop into both eyes at bedtime.    MAGNESIUM HYDROXIDE (MILK OF MAGNESIA) 400 MG/5ML SUSPENSION    Take 30 mLs by mouth  daily.   MEMANTINE (NAMENDA XR) 14 MG CP24 24 HR CAPSULE    Take 14 mg by mouth daily.   MIRTAZAPINE (REMERON) 7.5 MG TABLET    Take 7.5 mg by mouth at bedtime.   MULTIPLE VITAMIN TABLET    Take 1 tablet by mouth daily.   OMEPRAZOLE (PRILOSEC) 40 MG CAPSULE    Take 1 capsule (40 mg total) by mouth daily.   SENNA (SENOKOT) 8.6 MG TABLET    Take 1 tablet by mouth 2 (two) times daily. Reported on 10/29/2015   VITAMIN C (ASCORBIC ACID) 500 MG TABLET    Take 500 mg by mouth 2 (two) times daily.   ZINC SULFATE 220 MG CAPSULE    Take 220 mg by mouth daily. X 45 days until 01/09/16  Modified Medications   No medications on file  Discontinued Medications     SIGNIFICANT DIAGNOSTIC EXAMS  08-25-15: right shoulder x-ray: mild osteoarthritis  09-21-15: right  hand x-ray: negative     LABS REVIEWED:   08-26-15: uric acid: 1.6 09-04-15: urine culture: proteus mirabilis: cipro  09-23-15: wbc 10.5; hgb 7.4; hct 23.3; mcv 98.7 plt 578; glucose 71; bun 17; creat 0.78; k+ 4.4; na++142; liver normal albumin 1.9; chol 91; ldl 40; trig 77; hdl 36; hgb a1c 5.4     Review of Systems  Unable to perform ROS: dementia     Physical Exam  Constitutional: No distress.  Eyes: Conjunctivae are normal.  Neck: Neck supple. No JVD present. No thyromegaly present.  Has carotid bruit  Cardiovascular: Normal rate, regular rhythm pedal pulses very faint no post tib pulses present .   Murmur heard. 2/6  Respiratory: Effort normal and breath sounds normal. No respiratory distress. She has no wheezes.  GI: Soft. Bowel sounds are normal. She exhibits no distension. There is no tenderness.  Musculoskeletal: She exhibits no edema.  Able to move all extremities    Lymphadenopathy:    She has no cervical adenopathy.  Neurological: She is alert.  Skin: Skin is warm and dry. She is not diaphoretic.  Sacral wound; 20% slough 2 x 2.4 x 0.3 cm Left heel: 30% slough 2 x 1.4 x 0.1 cm with surrounding tissue is soft Right lateral leg 40% slough 2 x 0.99 x 0.5 cm  Bilateral feet: toes are necrotic Psychiatric: She has a normal mood and affect.      ASSESSMENT/ PLAN:  1. Hypertension: is currently not on medication; will monitor  2. Gout:  will continue colchicine 0.6 mg daily  as needed; uloric 40 mg daily;   3. Dyslipidemia: is currently not on medications; will monitor   4. Alzheimer's disease: no significant change in status; will continue aricept 5 mg nightly namenda xr 14 mg daily  will continue depakote sprinkles 125 mg twice daily for mood stabilization   5. Weight loss: will continue remeron 7.5 mg nightly her weight is 139 pounds; will continue prostat  30 cc three times daily for albumin 1.9   6. Gerd: will continue prilosec 40 mg daily   7.  Constipation: will continue senna twice daily   8. Diabetes: is diet controlled; is on novolog 3 units for cbg >=150; her hgb a1c is 5.4  9. Anemia: her hgb is 7.4; will monitor  10. Glaucoma: will continue latanoprost to both eyes nightly   11. Multiple ulcerations: will continue current wound treatment; will get an x-ray of left heel; will setup vascular consult will monitor   12. Peripheral neuropathy: she  did not tolerate neurontin; causing excessive lethargy and confusion; is currently not on medications will monitor her status.      Will get urine for micro-albumin         Ok Edwards NP [facility]  Contact [phone] Monday through Friday 8am- 5pm  After hours call [phone]

## 2015-12-31 NOTE — Progress Notes (Signed)
Patient ID: Shari Mcmahon, female   DOB: 1927/05/08, 82 y.o.   MRN: [id number]   Facility[facility]       Allergies  Allergen Reactions  . Voltaren [Diclofenac Sodium] Other (See Comments)    Stomach irritation  . Dulcolax [Bisacodyl]     Unknown   . Duloxetine Hcl Other (See Comments)    Made patient not want to eat or drink  . Penicillins Swelling    Unknown   . Timolol     REACTION: bradycardia worse    Chief Complaint  Patient presents with  . Acute Visit    lethargy     HPI:  She has been started on neurontin for her diabetic neuropathic pain. She will not wake up entirely. Her blood pressure is low as well. She is unable to fully participate in the hpi or ros.    Past Medical History  Diagnosis Date  . ABDOMINAL PAIN, CHRONIC 04/07/2008  . ARTHRITIS 03/03/2008  . BRADYCARDIA, CHRONIC 12/17/2008  . CARDIAC MURMUR 02/01/2010  . CONSTIPATION, CHRONIC 10/15/2010  . DEGENERATIVE JOINT DISEASE, KNEES, BILATERAL 12/07/2007  . DEPRESSION 06/17/2009  . DIABETES MELLITUS, TYPE II 09/19/2007    DIET CONTROL   . DYSPHAGIA UNSPECIFIED 12/24/2009  . Gastroparesis 12/24/2009  . GENERALIZED OSTEOARTHROSIS INVOLVING HAND 10/03/2007  . GLAUCOMA 09/19/2007  . GOUTY ARTHROPATHY UNSPECIFIED 02/17/2009  . GOUT 09/19/2007  . HYPERLIPIDEMIA 01/27/2010  . HYPERTENSION 09/19/2007  . LUNG NODULE 03/03/2008  . MENOPAUSAL DISORDER 12/17/2008  . PERIPHERAL NEUROPATHY 06/19/2008  . PERSONAL HISTORY MALIGNANT NEOPLASM STOMACH 09/19/2007  . 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 STOMACH CANCER 03/03/2008  . UTI 12/15/2009  . Arthritis of knee, degenerative 10/19/2011    Past Surgical History  Procedure Laterality Date  . Vagotomy    . Partial gastrectomy    . Billroth ii gastorenterostomy    . Colonoscopy      VITAL SIGNS BP 109/50 mmHg  Pulse 78  Temp(Src) 97.7 F (36.5 C)  Ht 5\' 5"  (1.651 m)  Wt 134 lb 9.6 oz (61.054 kg)  BMI 22.40 kg/m2  SpO2  96%  Patient's Medications  New Prescriptions   No medications on file  Previous Medications   AMINO ACIDS-PROTEIN HYDROLYS (FEEDING SUPPLEMENT, PRO-STAT SUGAR FREE 64,) LIQD    Take 30 mLs by mouth 3 (three) times daily with meals.   COLCHICINE 0.6 MG TABLET    Take 1 tablet (0.6 mg total) by mouth daily as needed.   DIVALPROEX (DEPAKOTE SPRINKLES) 125 MG CAPSULE    Take 1 capsule (125 mg total) by mouth 2 (two) times daily.   DONEPEZIL (ARICEPT) 5 MG TABLET    Take 5 mg by mouth at bedtime.   FEBUXOSTAT (ULORIC) 40 MG TABLET    Take 40 mg by mouth daily.   HYDROCODONE-ACETAMINOPHEN (NORCO/VICODIN) 5-325 MG TABLET    Take one tablet by mouth every 8 hours for pain. Not to exceed 3000mg  of APAP form all sources/24hr   INSULIN ASPART (NOVOLOG) 100 UNIT/ML INJECTION    Inject 3 Units into the skin 3 (three) times daily before meals. For cbg >=150   LATANOPROST (XALATAN) 0.005 % OPHTHALMIC SOLUTION    Place 1 drop into both eyes at bedtime.    Lisinopril 2.5 mg  2.5 mg daily    MAGNESIUM HYDROXIDE (MILK OF MAGNESIA) 400 MG/5ML SUSPENSION    Take 30 mLs by mouth daily.   MEMANTINE (NAMENDA XR) 14 MG CP24 24 HR CAPSULE  Take 14 mg by mouth daily.   MIRTAZAPINE (REMERON) 7.5 MG TABLET    Take 7.5 mg by mouth at bedtime.   MULTIPLE VITAMIN TABLET    Take 1 tablet by mouth daily.   neurontin 100 mg nightly    OMEPRAZOLE (PRILOSEC) 40 MG CAPSULE    Take 1 capsule (40 mg total) by mouth daily.   SENNA (SENOKOT) 8.6 MG TABLET    Take 1 tablet by mouth 2 (two) times daily. Reported on 10/29/2015   VITAMIN C (ASCORBIC ACID) 500 MG TABLET    Take 500 mg by mouth 2 (two) times daily.   ZINC SULFATE 220 MG CAPSULE    Take 220 mg by mouth daily. X 45 days until 01/09/16  Modified Medications   No medications on file  Discontinued Medications   No medications on file     SIGNIFICANT DIAGNOSTIC EXAMS   08-25-15: right shoulder x-ray: mild osteoarthritis  09-21-15: right hand x-ray: negative      LABS REVIEWED:   08-26-15: uric acid: 1.6 09-04-15: urine culture: proteus mirabilis: cipro  09-23-15: wbc 10.5; hgb 7.4; hct 23.3; mcv 98.7 plt 578; glucose 71; bun 17; creat 0.78; k+ 4.4; na++142; liver normal albumin 1.9; chol 91; ldl 40; trig 77; hdl 36; hgb a1c 5.4     Review of Systems  Unable to perform ROS: dementia     Physical Exam  Constitutional: No distress.  Eyes: Conjunctivae are normal.  Neck: Neck supple. No JVD present. No thyromegaly present.  Has carotid bruit  Cardiovascular: Normal rate, regular rhythm and intact distal pulses.   Murmur heard. 2/6  Respiratory: Effort normal and breath sounds normal. No respiratory distress. She has no wheezes.  GI: Soft. Bowel sounds are normal. She exhibits no distension. There is no tenderness.  Musculoskeletal: She exhibits no edema.  Able to move all extremities    Lymphadenopathy:    She has no cervical adenopathy.  Neurological: She is alert.  Skin: Skin is warm and dry. She is not diaphoretic.  Left heel unstaged: no signs of infection  Right heel and toes and bunion with deep tissue injury  Psychiatric: She has a normal mood and affect.      ASSESSMENT/ PLAN:  1. Peripheral neuropathy: will stop the neurontin as she is unable to tolerate this medication and will monitor  2. Hypertension: will stop her lisinopril and will monitor her status.      Ok Edwards NP [facility]  Contact [phone] Monday through Friday 8am- 5pm  After hours call [phone]

## 2016-01-01 DIAGNOSIS — N390 Urinary tract infection, site not specified: Secondary | ICD-10-CM | POA: Diagnosis not present

## 2016-01-01 DIAGNOSIS — E1151 Type 2 diabetes mellitus with diabetic peripheral angiopathy without gangrene: Secondary | ICD-10-CM | POA: Insufficient documentation

## 2016-01-01 DIAGNOSIS — L8990 Pressure ulcer of unspecified site, unspecified stage: Secondary | ICD-10-CM | POA: Insufficient documentation

## 2016-01-01 DIAGNOSIS — L89610 Pressure ulcer of right heel, unstageable: Secondary | ICD-10-CM | POA: Diagnosis not present

## 2016-01-01 DIAGNOSIS — S81809A Unspecified open wound, unspecified lower leg, initial encounter: Secondary | ICD-10-CM | POA: Diagnosis not present

## 2016-01-01 DIAGNOSIS — M79672 Pain in left foot: Secondary | ICD-10-CM | POA: Diagnosis not present

## 2016-01-01 DIAGNOSIS — L89624 Pressure ulcer of left heel, stage 4: Secondary | ICD-10-CM | POA: Diagnosis not present

## 2016-01-01 DIAGNOSIS — L97213 Non-pressure chronic ulcer of right calf with necrosis of muscle: Secondary | ICD-10-CM | POA: Diagnosis not present

## 2016-01-01 HISTORY — DX: Type 2 diabetes mellitus with diabetic peripheral angiopathy without gangrene: E11.51

## 2016-01-04 ENCOUNTER — Non-Acute Institutional Stay (SKILLED_NURSING_FACILITY): Payer: Medicare Other | Admitting: Adult Health

## 2016-01-04 ENCOUNTER — Encounter: Payer: Self-pay | Admitting: Adult Health

## 2016-01-04 DIAGNOSIS — Z9181 History of falling: Secondary | ICD-10-CM | POA: Diagnosis not present

## 2016-01-04 DIAGNOSIS — M109 Gout, unspecified: Secondary | ICD-10-CM | POA: Diagnosis not present

## 2016-01-04 DIAGNOSIS — L97423 Non-pressure chronic ulcer of left heel and midfoot with necrosis of muscle: Secondary | ICD-10-CM | POA: Diagnosis not present

## 2016-01-04 NOTE — Progress Notes (Signed)
Patient ID: Shari Mcmahon, female   DOB: 06/06/1927, 82 y.o.   MRN: [id number]   Facility: [facility]       Allergies  Allergen Reactions  . Voltaren [Diclofenac Sodium] Other (See Comments)    Stomach irritation  . Dulcolax [Bisacodyl]     Unknown   . Duloxetine Hcl Other (See Comments)    Made patient not want to eat or drink  . Penicillins Swelling    Unknown   . Timolol     REACTION: bradycardia worse    Chief Complaint  Patient presents with  . Acute Visit    Acute     HPI:  I have been asked to evaluate left heel wounds and her feet. She does have atherosclerotic disease. Her heel wound is getting worse; her toes are necrotic. There are no reports of fever present. She is unable to participate in the hpi or ros.   Past Medical History  Diagnosis Date  . ABDOMINAL PAIN, CHRONIC 04/07/2008  . ARTHRITIS 03/03/2008  . BRADYCARDIA, CHRONIC 12/17/2008  . CARDIAC MURMUR 02/01/2010  . CONSTIPATION, CHRONIC 10/15/2010  . DEGENERATIVE JOINT DISEASE, KNEES, BILATERAL 12/07/2007  . DEPRESSION 06/17/2009  . DIABETES MELLITUS, TYPE II 09/19/2007    DIET CONTROL   . DYSPHAGIA UNSPECIFIED 12/24/2009  . Gastroparesis 12/24/2009  . GENERALIZED OSTEOARTHROSIS INVOLVING HAND 10/03/2007  . GLAUCOMA 09/19/2007  . GOUTY ARTHROPATHY UNSPECIFIED 02/17/2009  . GOUT 09/19/2007  . HYPERLIPIDEMIA 01/27/2010  . HYPERTENSION 09/19/2007  . LUNG NODULE 03/03/2008  . MENOPAUSAL DISORDER 12/17/2008  . PERIPHERAL NEUROPATHY 06/19/2008  . PERSONAL HISTORY MALIGNANT NEOPLASM STOMACH 09/19/2007  . 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 STOMACH CANCER 03/03/2008  . UTI 12/15/2009  . Arthritis of knee, degenerative 10/19/2011    Past Surgical History  Procedure Laterality Date  . Vagotomy    . Partial gastrectomy    . Billroth ii gastorenterostomy    . Colonoscopy      VITAL SIGNS BP 121/51 mmHg  Pulse 68  Temp(Src) 99.1 F (37.3 C) (Oral)  Resp 16  Ht 5\' 5"  (1.651 m)  Wt  138 lb 2 oz (62.653 kg)  BMI 22.99 kg/m2  SpO2 96%  Patient's Medications  New Prescriptions   No medications on file  Previous Medications   AMINO ACIDS-PROTEIN HYDROLYS (FEEDING SUPPLEMENT, PRO-STAT SUGAR FREE 64,) LIQD    Take 30 mLs by mouth 3 (three) times daily with meals.   COLCHICINE 0.6 MG TABLET    Take 1 tablet (0.6 mg total) by mouth daily as needed.   DIVALPROEX (DEPAKOTE SPRINKLES) 125 MG CAPSULE    Take 1 capsule (125 mg total) by mouth 2 (two) times daily.   DONEPEZIL (ARICEPT) 10 MG TABLET    Take 10 mg by mouth at bedtime.   FEBUXOSTAT (ULORIC) 40 MG TABLET    Take 40 mg by mouth daily.   HYDROCODONE-ACETAMINOPHEN (NORCO/VICODIN) 5-325 MG TABLET    Take one tablet by mouth every 8 hours for pain. Not to exceed 3000mg  of APAP form all sources/24hr   INSULIN ASPART (NOVOLOG) 100 UNIT/ML INJECTION    Inject 3 Units into the skin 3 (three) times daily before meals. For cbg >=150   LATANOPROST (XALATAN) 0.005 % OPHTHALMIC SOLUTION    Place 1 drop into both eyes at bedtime.    MAGNESIUM HYDROXIDE (MILK OF MAGNESIA) 400 MG/5ML SUSPENSION    Take 30 mLs by mouth daily.   MEMANTINE (NAMENDA XR) 14 MG CP24 24 HR  CAPSULE    Take 14 mg by mouth daily.   MIRTAZAPINE (REMERON) 7.5 MG TABLET    Take 7.5 mg by mouth at bedtime.   MULTIPLE VITAMIN TABLET    Take 1 tablet by mouth daily.   OMEPRAZOLE (PRILOSEC) 40 MG CAPSULE    Take 1 capsule (40 mg total) by mouth daily.   SENNA (SENOKOT) 8.6 MG TABLET    Take 1 tablet by mouth 2 (two) times daily. Reported on 10/29/2015   VITAMIN C (ASCORBIC ACID) 500 MG TABLET    Take 500 mg by mouth 2 (two) times daily.   ZINC SULFATE 220 MG CAPSULE    Take 220 mg by mouth daily. X 45 days until 01/09/16  Modified Medications   No medications on file  Discontinued Medications     SIGNIFICANT DIAGNOSTIC EXAMS  08-25-15: right shoulder x-ray: mild osteoarthritis  09-21-15: right hand x-ray: negative   01-01-16: left heel: soft tissue erosion post  to calcaneus possible osteomyelitis involving post calcaneus achilles and plantar calcaneus spurs  01-01-16: bilateral ABI: moderate diffuse atheromatous changes. Right: 1.38; left 0.96     LABS REVIEWED:   08-26-15: uric acid: 1.6 09-04-15: urine culture: proteus mirabilis: cipro  09-23-15: wbc 10.5; hgb 7.4; hct 23.3; mcv 98.7 plt 578; glucose 71; bun 17; creat 0.78; k+ 4.4; na++142; liver normal albumin 1.9; chol 91; ldl 40; trig 77; hdl 36; hgb a1c 5.4  01-02-16: urine culture; providencia stuartii: septra ds 01-04-16: urine micro-albumin 1.5    Review of Systems  Unable to perform ROS: dementia     Physical Exam  Constitutional: No distress.  Eyes: Conjunctivae are normal.  Neck: Neck supple. No JVD present. No thyromegaly present.  Has carotid bruit  Cardiovascular: Normal rate, regular rhythm pedal pulses very faint no post tib pulses present .   Murmur heard. 2/6  Respiratory: Effort normal and breath sounds normal. No respiratory distress. She has no wheezes.  GI: Soft. Bowel sounds are normal. She exhibits no distension. There is no tenderness.  Musculoskeletal: She exhibits no edema.  Able to move all extremities    Lymphadenopathy:    She has no cervical adenopathy.  Neurological: She is alert.  Skin: Skin is warm and dry. She is not diaphoretic.  Sacral wound; 20% slough 2 x 2.4 x 0.3 cm Left heel: 30% slough 2 x 1.4 x 0.1 cm with surrounding tissue is soft Right lateral leg 40% slough 2 x 0.99 x 0.5 cm  Bilateral feet: toes are necrotic Psychiatric: She has a normal mood and affect.      ASSESSMENT/ PLAN:   11. Multiple ulcerations: will continue current wound treatment; will setup a ct scan of her left foot. Awaiting vascular consult. Will insert picc line and treat as indicated. Her daughter is aware of her status.    Time spent with patient  40  minutes >50% time spent counseling; reviewing medical record; tests; labs; and developing future plan of  care      Ok Edwards NP [facility]  Contact [phone] Monday through Friday 8am- 5pm  After hours call [phone]

## 2016-01-05 ENCOUNTER — Other Ambulatory Visit: Payer: Self-pay | Admitting: Internal Medicine

## 2016-01-05 DIAGNOSIS — M79672 Pain in left foot: Secondary | ICD-10-CM

## 2016-01-05 DIAGNOSIS — N390 Urinary tract infection, site not specified: Secondary | ICD-10-CM | POA: Diagnosis not present

## 2016-01-05 DIAGNOSIS — B999 Unspecified infectious disease: Secondary | ICD-10-CM

## 2016-01-06 ENCOUNTER — Ambulatory Visit (HOSPITAL_COMMUNITY): Admission: RE | Admit: 2016-01-06 | Payer: Medicare Other | Source: Ambulatory Visit

## 2016-01-07 ENCOUNTER — Encounter: Payer: Self-pay | Admitting: Vascular Surgery

## 2016-01-08 ENCOUNTER — Ambulatory Visit (HOSPITAL_COMMUNITY): Payer: Medicare Other

## 2016-01-08 DIAGNOSIS — L89610 Pressure ulcer of right heel, unstageable: Secondary | ICD-10-CM | POA: Diagnosis not present

## 2016-01-08 DIAGNOSIS — L97212 Non-pressure chronic ulcer of right calf with fat layer exposed: Secondary | ICD-10-CM | POA: Diagnosis not present

## 2016-01-08 DIAGNOSIS — L89624 Pressure ulcer of left heel, stage 4: Secondary | ICD-10-CM | POA: Diagnosis not present

## 2016-01-11 ENCOUNTER — Ambulatory Visit (HOSPITAL_COMMUNITY)
Admission: RE | Admit: 2016-01-11 | Discharge: 2016-01-11 | Disposition: A | Discharge status: Home / Self Care | Payer: Medicare Other | Source: Ambulatory Visit | Attending: Internal Medicine | Admitting: Internal Medicine

## 2016-01-11 ENCOUNTER — Encounter: Payer: Self-pay | Admitting: Vascular Surgery

## 2016-01-11 ENCOUNTER — Ambulatory Visit (HOSPITAL_COMMUNITY): Payer: Medicare Other

## 2016-01-11 DIAGNOSIS — L97429 Non-pressure chronic ulcer of left heel and midfoot with unspecified severity: Secondary | ICD-10-CM | POA: Insufficient documentation

## 2016-01-11 DIAGNOSIS — R600 Localized edema: Secondary | ICD-10-CM | POA: Diagnosis not present

## 2016-01-11 DIAGNOSIS — L97529 Non-pressure chronic ulcer of other part of left foot with unspecified severity: Secondary | ICD-10-CM | POA: Diagnosis not present

## 2016-01-11 DIAGNOSIS — M79672 Pain in left foot: Secondary | ICD-10-CM | POA: Diagnosis not present

## 2016-01-11 DIAGNOSIS — M8580 Other specified disorders of bone density and structure, unspecified site: Secondary | ICD-10-CM | POA: Insufficient documentation

## 2016-01-11 MED ORDER — IOHEXOL 300 MG/ML  SOLN
100.0000 mL | Freq: Once | INTRAMUSCULAR | Status: AC | PRN
  Administered 2016-01-11: 100 mL via INTRAVENOUS

## 2016-01-14 ENCOUNTER — Encounter: Payer: Self-pay | Admitting: Vascular Surgery

## 2016-01-14 ENCOUNTER — Ambulatory Visit (INDEPENDENT_AMBULATORY_CARE_PROVIDER_SITE_OTHER): Payer: Medicare Other | Admitting: Vascular Surgery

## 2016-01-14 DIAGNOSIS — I70263 Atherosclerosis of native arteries of extremities with gangrene, bilateral legs: Secondary | ICD-10-CM | POA: Diagnosis not present

## 2016-01-14 NOTE — Progress Notes (Signed)
[facility] [place]  Patient name: Shari Mcmahon MRN: [id number] DOB: [birth year] Sex: female  REASON FOR CONSULT: necrotic toes right foot and ulceration left heel.  HPI: Shari Mcmahon is a 82 y.o. female, who noted the gradual onset of some wounds on the toes of her right foot about 2 months ago. She's also developed a superficial ulceration on both heels. She does describe some pain in her feet but states that elevation helps which would suggest that this pain is not ischemic in origin.  She did fall and break her right hip in May 2016 and has been nonambulatory since that time. She denies any history of rest pain. She is in a skilled nursing facility. Her appetite is fairly poor.  I have reviewed the records that were sent with the patient. She does have a history of Alzheimer's disease. Patient also has a history of a stage II pressure sore of the sacrum.  Past Medical History  Diagnosis Date  . ABDOMINAL PAIN, CHRONIC 04/07/2008  . ARTHRITIS 03/03/2008  . BRADYCARDIA, CHRONIC 12/17/2008  . CARDIAC MURMUR 02/01/2010  . CONSTIPATION, CHRONIC 10/15/2010  . DEGENERATIVE JOINT DISEASE, KNEES, BILATERAL 12/07/2007  . DEPRESSION 06/17/2009  . DIABETES MELLITUS, TYPE II 09/19/2007    DIET CONTROL   . DYSPHAGIA UNSPECIFIED 12/24/2009  . Gastroparesis 12/24/2009  . GENERALIZED OSTEOARTHROSIS INVOLVING HAND 10/03/2007  . GLAUCOMA 09/19/2007  . GOUTY ARTHROPATHY UNSPECIFIED 02/17/2009  . GOUT 09/19/2007  . HYPERLIPIDEMIA 01/27/2010  . HYPERTENSION 09/19/2007  . LUNG NODULE 03/03/2008  . MENOPAUSAL DISORDER 12/17/2008  . PERIPHERAL NEUROPATHY 06/19/2008  . PERSONAL HISTORY MALIGNANT NEOPLASM STOMACH 09/19/2007  . 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 STOMACH CANCER 03/03/2008  . UTI 12/15/2009  . Arthritis of knee, degenerative 10/19/2011    Family History  Problem Relation Age of Onset  . Uterine cancer Mother   . Stroke Father     Hemorrhagic    . Cancer Sister     breast cancer and one sister with uterine cancer and one sister with ovarian cancerr  . Colon cancer Maternal Grandmother   . Colon cancer Paternal Grandfather   . Diabetes Other   . Stomach cancer Neg Hx   . Prostate cancer Brother     SOCIAL HISTORY: Social History   Social History  . Marital Status: Widowed    Spouse Name: N/A  . Number of Children: 6  . Years of Education: 8   Occupational History  . retired    Social History Main Topics  . Smoking status: Former Smoker    Types: Cigarettes  . Smokeless tobacco: Never Used     Comment: use to smoke a pack of cigaretts a day for 25 yrs. but stopped 10 yrs. ago.   . Alcohol Use: No  . Drug Use: No  . Sexual Activity: Not on file   Other Topics Concern  . Not on file   Social History Narrative   Lives with her daughter    Allergies  Allergen Reactions  . Voltaren [Diclofenac Sodium] Other (See Comments)    Stomach irritation  . Dulcolax [Bisacodyl]     Unknown   . Duloxetine Hcl Other (See Comments)    Made patient not want to eat or drink  . Penicillins Swelling    Unknown   . Timolol     REACTION: bradycardia worse    Current Outpatient Prescriptions  Medication Sig Dispense Refill  . Amino Acids-Protein Hydrolys (  FEEDING SUPPLEMENT, PRO-STAT SUGAR FREE 64,) LIQD Take 30 mLs by mouth 3 (three) times daily with meals. 900 mL prn  . colchicine 0.6 MG tablet Take 1 tablet (0.6 mg total) by mouth daily as needed. 90 tablet prn  . divalproex (DEPAKOTE SPRINKLES) 125 MG capsule Take 1 capsule (125 mg total) by mouth 2 (two) times daily. 60 capsule 5  . donepezil (ARICEPT) 10 MG tablet Take 10 mg by mouth at bedtime.    . febuxostat (ULORIC) 40 MG tablet Take 40 mg by mouth daily.    . insulin aspart (NOVOLOG) 100 UNIT/ML injection Inject 3 Units into the skin 3 (three) times daily before meals. For cbg >=150    . latanoprost (XALATAN) 0.005 % ophthalmic solution Place 1 drop into both eyes  at bedtime.     . magnesium hydroxide (MILK OF MAGNESIA) 400 MG/5ML suspension Take 30 mLs by mouth daily.    . memantine (NAMENDA XR) 14 MG CP24 24 hr capsule Take 14 mg by mouth daily.    . mirtazapine (REMERON) 7.5 MG tablet Take 7.5 mg by mouth at bedtime.    . Multiple Vitamin tablet Take 1 tablet by mouth daily.    Marland Kitchen omeprazole (PRILOSEC) 40 MG capsule Take 1 capsule (40 mg total) by mouth daily. 90 capsule 3  . senna (SENOKOT) 8.6 MG tablet Take 1 tablet by mouth 2 (two) times daily. Reported on 10/29/2015    . vitamin C (ASCORBIC ACID) 500 MG tablet Take 500 mg by mouth 2 (two) times daily.    Marland Kitchen zinc sulfate 220 MG capsule Take 220 mg by mouth daily. X 45 days until 01/09/16    . HYDROcodone-acetaminophen (NORCO/VICODIN) 5-325 MG tablet Take one tablet by mouth every 8 hours for pain. Not to exceed 3000mg  of APAP form all sources/24hr (Patient not taking: Reported on 01/14/2016) 90 tablet 0   No current facility-administered medications for this visit.    REVIEW OF SYSTEMS:  [X]  denotes positive finding, [ ]  denotes negative finding Cardiac  Comments:  Chest pain or chest pressure:    Shortness of breath upon exertion: X   Short of breath when lying flat:    Irregular heart rhythm:        Vascular    Pain in calf, thigh, or hip brought on by ambulation:    Pain in feet at night that wakes you up from your sleep:     Blood clot in your veins:    Leg swelling:         Pulmonary    Oxygen at home:    Productive cough:     Wheezing:         Neurologic    Sudden weakness in arms or legs:     Sudden numbness in arms or legs:     Sudden onset of difficulty speaking or slurred speech:    Temporary loss of vision in one eye:     Problems with dizziness:         Gastrointestinal    Blood in stool:     Vomited blood:         Genitourinary    Burning when urinating:     Blood in urine:        Psychiatric    Major depression:         Hematologic    Bleeding problems:      Problems with blood clotting too easily:        Skin  Rashes or ulcers:        Constitutional    Fever or chills:      PHYSICAL EXAM: Filed Vitals:   01/14/16 1515  BP: 134/59  Pulse: 63  Temp: 97.1 F (36.2 C)  TempSrc: Oral  Height: 5\' 5"  (1.651 m)  Weight: 138 lb (62.596 kg)    GENERAL: The patient is a well-nourished female, in no acute distress. The vital signs are documented above. CARDIAC: There is a regular rate and rhythm. He does have a fairly  loud systolic ejection murmur. VASCULAR: I do not detect carotid bruits. She has palpable femoral and popliteal pulses bilaterally. I cannot palpate pedal pulses. She has a monophasic dorsalis pedis signal bilaterally and a fairly brisk posterior tibial signal bilaterally. PULMONARY: There is good air exchange bilaterally without wheezing or rales. ABDOMEN: Soft and non-tender with normal pitched bowel sounds.  MUSCULOSKELETAL: There are no major deformities or cyanosis. NEUROLOGIC: No focal weakness or paresthesias are detected. SKIN: There are no ulcers or rashes noted. PSYCHIATRIC: The patient has a normal affect.  DATA:   BILATERAL LOWER EXTREMITY DOPPLER: I have reviewed the Doppler study that was done by [facility] on 01/01/2016.  On the right side it was evidence of diffuse atherosclerosis. There was biphasic flow in the right common femoral artery with monophasic flow below that. ABI was greater than 100% likely related to calcified  arteries. On the left side there was biphasic flow in the common femoral artery with monophasic flow below that and an ABI of 96%.  I have reviewed the x-ray of the left calcaneus which shows soft tissue erosion to the calcaneus with possible osteomyelitis.  MEDICAL ISSUES:  NONHEALING WOUNDS BOTH FEET WITH TIBIAL ARTERY OCCLUSIVE DISEASE: Based on her exam she has evidence of some tibial artery occlusive disease although Doppler signals are fairly reasonable.  Regardless, she is not a candidate for revascularization given her age and multiple medical comorbidities including Alzheimer's disease, severe protein calorie malnutrition, and likely aortic stenosis. I have made some recommendations for the skilled nursing facility to prevent pressure sores on the heels and to keep dry gauze between the toes. It would also be helpful to soak the foot daily in lukewarm dialysis soap soaks. If the wounds progress on the right foot than she could be considered for toe amputations. She has made it clear that she does not want a more proximal amputation. I will see her back as needed.   Deitra Mayo [facility] [place]: [phone]

## 2016-01-15 DIAGNOSIS — L89624 Pressure ulcer of left heel, stage 4: Secondary | ICD-10-CM | POA: Diagnosis not present

## 2016-01-15 DIAGNOSIS — I83014 Varicose veins of right lower extremity with ulcer of heel and midfoot: Secondary | ICD-10-CM | POA: Diagnosis not present

## 2016-01-15 DIAGNOSIS — L97212 Non-pressure chronic ulcer of right calf with fat layer exposed: Secondary | ICD-10-CM | POA: Diagnosis not present

## 2016-01-17 DIAGNOSIS — L97423 Non-pressure chronic ulcer of left heel and midfoot with necrosis of muscle: Secondary | ICD-10-CM

## 2016-01-17 HISTORY — DX: Non-pressure chronic ulcer of left heel and midfoot with necrosis of muscle: L97.423

## 2016-01-21 ENCOUNTER — Emergency Department (HOSPITAL_COMMUNITY): Payer: Medicare Other

## 2016-01-21 ENCOUNTER — Inpatient Hospital Stay (HOSPITAL_COMMUNITY)
Admission: EM | Admit: 2016-01-21 | Discharge: 2016-01-29 | DRG: 177 | Disposition: A | Discharge status: Skilled Nursing Facility | Payer: Medicare Other | Attending: Internal Medicine | Admitting: Internal Medicine

## 2016-01-21 ENCOUNTER — Encounter (HOSPITAL_COMMUNITY): Payer: Self-pay

## 2016-01-21 DIAGNOSIS — E1142 Type 2 diabetes mellitus with diabetic polyneuropathy: Secondary | ICD-10-CM | POA: Diagnosis not present

## 2016-01-21 DIAGNOSIS — Z66 Do not resuscitate: Secondary | ICD-10-CM | POA: Diagnosis not present

## 2016-01-21 DIAGNOSIS — R509 Fever, unspecified: Secondary | ICD-10-CM | POA: Diagnosis not present

## 2016-01-21 DIAGNOSIS — I10 Essential (primary) hypertension: Secondary | ICD-10-CM | POA: Diagnosis present

## 2016-01-21 DIAGNOSIS — N189 Chronic kidney disease, unspecified: Secondary | ICD-10-CM | POA: Diagnosis not present

## 2016-01-21 DIAGNOSIS — I129 Hypertensive chronic kidney disease with stage 1 through stage 4 chronic kidney disease, or unspecified chronic kidney disease: Secondary | ICD-10-CM | POA: Diagnosis not present

## 2016-01-21 DIAGNOSIS — F0280 Dementia in other diseases classified elsewhere without behavioral disturbance: Secondary | ICD-10-CM | POA: Diagnosis present

## 2016-01-21 DIAGNOSIS — G629 Polyneuropathy, unspecified: Secondary | ICD-10-CM | POA: Diagnosis present

## 2016-01-21 DIAGNOSIS — L97529 Non-pressure chronic ulcer of other part of left foot with unspecified severity: Secondary | ICD-10-CM | POA: Diagnosis not present

## 2016-01-21 DIAGNOSIS — R54 Age-related physical debility: Secondary | ICD-10-CM | POA: Diagnosis present

## 2016-01-21 DIAGNOSIS — E1151 Type 2 diabetes mellitus with diabetic peripheral angiopathy without gangrene: Secondary | ICD-10-CM | POA: Diagnosis present

## 2016-01-21 DIAGNOSIS — Z85028 Personal history of other malignant neoplasm of stomach: Secondary | ICD-10-CM

## 2016-01-21 DIAGNOSIS — M1990 Unspecified osteoarthritis, unspecified site: Secondary | ICD-10-CM | POA: Diagnosis present

## 2016-01-21 DIAGNOSIS — E1152 Type 2 diabetes mellitus with diabetic peripheral angiopathy with gangrene: Secondary | ICD-10-CM | POA: Diagnosis present

## 2016-01-21 DIAGNOSIS — G8929 Other chronic pain: Secondary | ICD-10-CM | POA: Diagnosis present

## 2016-01-21 DIAGNOSIS — E870 Hyperosmolality and hypernatremia: Secondary | ICD-10-CM | POA: Diagnosis not present

## 2016-01-21 DIAGNOSIS — M1000 Idiopathic gout, unspecified site: Secondary | ICD-10-CM | POA: Diagnosis present

## 2016-01-21 DIAGNOSIS — Z515 Encounter for palliative care: Secondary | ICD-10-CM | POA: Diagnosis not present

## 2016-01-21 DIAGNOSIS — I771 Stricture of artery: Secondary | ICD-10-CM | POA: Diagnosis present

## 2016-01-21 DIAGNOSIS — E43 Unspecified severe protein-calorie malnutrition: Secondary | ICD-10-CM | POA: Diagnosis present

## 2016-01-21 DIAGNOSIS — R52 Pain, unspecified: Secondary | ICD-10-CM

## 2016-01-21 DIAGNOSIS — J189 Pneumonia, unspecified organism: Secondary | ICD-10-CM | POA: Diagnosis not present

## 2016-01-21 DIAGNOSIS — E1143 Type 2 diabetes mellitus with diabetic autonomic (poly)neuropathy: Secondary | ICD-10-CM | POA: Diagnosis not present

## 2016-01-21 DIAGNOSIS — E1129 Type 2 diabetes mellitus with other diabetic kidney complication: Secondary | ICD-10-CM | POA: Diagnosis not present

## 2016-01-21 DIAGNOSIS — R1310 Dysphagia, unspecified: Secondary | ICD-10-CM | POA: Diagnosis present

## 2016-01-21 DIAGNOSIS — R4702 Dysphasia: Secondary | ICD-10-CM | POA: Diagnosis present

## 2016-01-21 DIAGNOSIS — Z79899 Other long term (current) drug therapy: Secondary | ICD-10-CM | POA: Diagnosis not present

## 2016-01-21 DIAGNOSIS — Z794 Long term (current) use of insulin: Secondary | ICD-10-CM

## 2016-01-21 DIAGNOSIS — Z87440 Personal history of urinary (tract) infections: Secondary | ICD-10-CM

## 2016-01-21 DIAGNOSIS — N179 Acute kidney failure, unspecified: Secondary | ICD-10-CM | POA: Diagnosis not present

## 2016-01-21 DIAGNOSIS — Z823 Family history of stroke: Secondary | ICD-10-CM | POA: Diagnosis not present

## 2016-01-21 DIAGNOSIS — Z9181 History of falling: Secondary | ICD-10-CM | POA: Diagnosis not present

## 2016-01-21 DIAGNOSIS — E11621 Type 2 diabetes mellitus with foot ulcer: Secondary | ICD-10-CM | POA: Diagnosis present

## 2016-01-21 DIAGNOSIS — D649 Anemia, unspecified: Secondary | ICD-10-CM | POA: Diagnosis present

## 2016-01-21 DIAGNOSIS — G9340 Encephalopathy, unspecified: Secondary | ICD-10-CM | POA: Diagnosis present

## 2016-01-21 DIAGNOSIS — M10041 Idiopathic gout, right hand: Secondary | ICD-10-CM

## 2016-01-21 DIAGNOSIS — E119 Type 2 diabetes mellitus without complications: Secondary | ICD-10-CM | POA: Diagnosis not present

## 2016-01-21 DIAGNOSIS — E8770 Fluid overload, unspecified: Secondary | ICD-10-CM | POA: Diagnosis present

## 2016-01-21 DIAGNOSIS — Y95 Nosocomial condition: Secondary | ICD-10-CM | POA: Diagnosis present

## 2016-01-21 DIAGNOSIS — Z87891 Personal history of nicotine dependence: Secondary | ICD-10-CM

## 2016-01-21 DIAGNOSIS — L97519 Non-pressure chronic ulcer of other part of right foot with unspecified severity: Secondary | ICD-10-CM | POA: Diagnosis not present

## 2016-01-21 DIAGNOSIS — Z833 Family history of diabetes mellitus: Secondary | ICD-10-CM | POA: Diagnosis not present

## 2016-01-21 DIAGNOSIS — L89153 Pressure ulcer of sacral region, stage 3: Secondary | ICD-10-CM | POA: Diagnosis not present

## 2016-01-21 DIAGNOSIS — R402421 Glasgow coma scale score 9-12, in the field [EMT or ambulance]: Secondary | ICD-10-CM | POA: Diagnosis not present

## 2016-01-21 DIAGNOSIS — A419 Sepsis, unspecified organism: Secondary | ICD-10-CM | POA: Diagnosis not present

## 2016-01-21 DIAGNOSIS — Z6822 Body mass index (BMI) 22.0-22.9, adult: Secondary | ICD-10-CM

## 2016-01-21 DIAGNOSIS — M179 Osteoarthritis of knee, unspecified: Secondary | ICD-10-CM | POA: Diagnosis present

## 2016-01-21 DIAGNOSIS — L97423 Non-pressure chronic ulcer of left heel and midfoot with necrosis of muscle: Secondary | ICD-10-CM | POA: Diagnosis present

## 2016-01-21 DIAGNOSIS — R531 Weakness: Secondary | ICD-10-CM | POA: Insufficient documentation

## 2016-01-21 DIAGNOSIS — J690 Pneumonitis due to inhalation of food and vomit: Secondary | ICD-10-CM | POA: Diagnosis not present

## 2016-01-21 DIAGNOSIS — L8990 Pressure ulcer of unspecified site, unspecified stage: Secondary | ICD-10-CM | POA: Diagnosis present

## 2016-01-21 DIAGNOSIS — G309 Alzheimer's disease, unspecified: Secondary | ICD-10-CM | POA: Diagnosis not present

## 2016-01-21 DIAGNOSIS — R627 Adult failure to thrive: Secondary | ICD-10-CM | POA: Diagnosis present

## 2016-01-21 DIAGNOSIS — S72001D Fracture of unspecified part of neck of right femur, subsequent encounter for closed fracture with routine healing: Secondary | ICD-10-CM | POA: Diagnosis not present

## 2016-01-21 DIAGNOSIS — R4182 Altered mental status, unspecified: Secondary | ICD-10-CM | POA: Diagnosis not present

## 2016-01-21 DIAGNOSIS — L89309 Pressure ulcer of unspecified buttock, unspecified stage: Secondary | ICD-10-CM | POA: Diagnosis not present

## 2016-01-21 DIAGNOSIS — I120 Hypertensive chronic kidney disease with stage 5 chronic kidney disease or end stage renal disease: Secondary | ICD-10-CM | POA: Diagnosis not present

## 2016-01-21 DIAGNOSIS — Z7189 Other specified counseling: Secondary | ICD-10-CM | POA: Insufficient documentation

## 2016-01-21 LAB — I-STAT CHEM 8, ED
BUN: 45 mg/dL — AB (ref 6–20)
CHLORIDE: 106 mmol/L (ref 101–111)
CREATININE: 1.3 mg/dL — AB (ref 0.44–1.00)
Calcium, Ion: 1.38 mmol/L — ABNORMAL HIGH (ref 1.13–1.30)
GLUCOSE: 138 mg/dL — AB (ref 65–99)
HCT: 32 % — ABNORMAL LOW (ref 36.0–46.0)
Hemoglobin: 10.9 g/dL — ABNORMAL LOW (ref 12.0–15.0)
POTASSIUM: 4.3 mmol/L (ref 3.5–5.1)
Sodium: 142 mmol/L (ref 135–145)
TCO2: 26 mmol/L (ref 0–100)

## 2016-01-21 LAB — URINALYSIS, ROUTINE W REFLEX MICROSCOPIC
Bilirubin Urine: NEGATIVE
GLUCOSE, UA: NEGATIVE mg/dL
Ketones, ur: NEGATIVE mg/dL
Nitrite: NEGATIVE
PH: 5.5 (ref 5.0–8.0)
Protein, ur: 100 mg/dL — AB
SPECIFIC GRAVITY, URINE: 1.018 (ref 1.005–1.030)

## 2016-01-21 LAB — CBC WITH DIFFERENTIAL/PLATELET
BASOS ABS: 0 10*3/uL (ref 0.0–0.1)
Basophils Relative: 0 %
Eosinophils Absolute: 0.2 10*3/uL (ref 0.0–0.7)
Eosinophils Relative: 1 %
HEMATOCRIT: 26.6 % — AB (ref 36.0–46.0)
Hemoglobin: 8.7 g/dL — ABNORMAL LOW (ref 12.0–15.0)
LYMPHS ABS: 2.7 10*3/uL (ref 0.7–4.0)
LYMPHS PCT: 11 %
MCH: 32.1 pg (ref 26.0–34.0)
MCHC: 32.7 g/dL (ref 30.0–36.0)
MCV: 98.2 fL (ref 78.0–100.0)
Monocytes Absolute: 1.8 10*3/uL — ABNORMAL HIGH (ref 0.1–1.0)
Monocytes Relative: 8 %
NEUTROS ABS: 18.9 10*3/uL — AB (ref 1.7–7.7)
Neutrophils Relative %: 80 %
Platelets: 364 10*3/uL (ref 150–400)
RBC: 2.71 MIL/uL — AB (ref 3.87–5.11)
RDW: 19.6 % — ABNORMAL HIGH (ref 11.5–15.5)
WBC: 23.6 10*3/uL — AB (ref 4.0–10.5)

## 2016-01-21 LAB — URINE MICROSCOPIC-ADD ON

## 2016-01-21 LAB — INFLUENZA PANEL BY PCR (TYPE A & B)
H1N1FLUPCR: NOT DETECTED
INFLAPCR: NEGATIVE
Influenza B By PCR: NEGATIVE

## 2016-01-21 LAB — HEPATIC FUNCTION PANEL
ALT: 9 U/L — AB (ref 14–54)
AST: 20 U/L (ref 15–41)
Albumin: 2 g/dL — ABNORMAL LOW (ref 3.5–5.0)
Alkaline Phosphatase: 60 U/L (ref 38–126)
Total Bilirubin: 0.3 mg/dL (ref 0.3–1.2)
Total Protein: 6.1 g/dL — ABNORMAL LOW (ref 6.5–8.1)

## 2016-01-21 LAB — MRSA PCR SCREENING: MRSA by PCR: NEGATIVE

## 2016-01-21 LAB — CREATININE, SERUM
CREATININE: 1.35 mg/dL — AB (ref 0.44–1.00)
GFR calc Af Amer: 40 mL/min — ABNORMAL LOW (ref >60)
GFR calc non Af Amer: 34 mL/min — ABNORMAL LOW (ref >60)

## 2016-01-21 LAB — I-STAT CG4 LACTIC ACID, ED
LACTIC ACID, VENOUS: 1.97 mmol/L (ref 0.5–2.0)
LACTIC ACID, VENOUS: 1.19 mmol/L (ref 0.5–2.0)

## 2016-01-21 LAB — GLUCOSE, CAPILLARY
GLUCOSE-CAPILLARY: 85 mg/dL (ref 65–99)
GLUCOSE-CAPILLARY: 127 mg/dL — AB (ref 65–99)
Glucose-Capillary: 112 mg/dL — ABNORMAL HIGH (ref 65–99)

## 2016-01-21 LAB — C-REACTIVE PROTEIN: CRP: 11.8 mg/dL — ABNORMAL HIGH (ref <1.0)

## 2016-01-21 LAB — SEDIMENTATION RATE: Sed Rate: 80 mm/hr — ABNORMAL HIGH (ref 0–22)

## 2016-01-21 MED ORDER — MAGNESIUM HYDROXIDE 400 MG/5ML PO SUSP
30.0000 mL | Freq: Every day | ORAL | Status: DC
  Administered 2016-01-21 – 2016-01-25 (×4): 30 mL via ORAL
  Filled 2016-01-21 (×2): qty 30

## 2016-01-21 MED ORDER — MULTIPLE VITAMIN PO TABS: 1.0000 | ORAL_TABLET | Freq: Every day | ORAL | Status: DC

## 2016-01-21 MED ORDER — HYDROCODONE-ACETAMINOPHEN 5-325 MG PO TABS
1.0000 | ORAL_TABLET | Freq: Four times a day (QID) | ORAL | Status: DC | PRN
  Administered 2016-01-22 – 2016-01-24 (×5): 1 via ORAL
  Filled 2016-01-21 (×5): qty 1

## 2016-01-21 MED ORDER — SODIUM CHLORIDE 0.9 % IV SOLN
Freq: Once | INTRAVENOUS | Status: AC
  Administered 2016-01-21: 06:00:00 via INTRAVENOUS

## 2016-01-21 MED ORDER — DIVALPROEX SODIUM 125 MG PO CSDR
125.0000 mg | DELAYED_RELEASE_CAPSULE | Freq: Two times a day (BID) | ORAL | Status: DC
  Administered 2016-01-21 – 2016-01-25 (×9): 125 mg via ORAL
  Filled 2016-01-21 (×10): qty 1

## 2016-01-21 MED ORDER — INSULIN ASPART 100 UNIT/ML ~~LOC~~ SOLN: 0.0000 [IU] | Freq: Every day | SUBCUTANEOUS | Status: DC

## 2016-01-21 MED ORDER — MEMANTINE HCL ER 28 MG PO CP24
28.0000 mg | ORAL_CAPSULE | Freq: Every day | ORAL | Status: DC
  Administered 2016-01-21 – 2016-01-29 (×9): 28 mg via ORAL
  Filled 2016-01-21 (×9): qty 1

## 2016-01-21 MED ORDER — ALTEPLASE 2 MG IJ SOLR
2.0000 mg | Freq: Once | INTRAMUSCULAR | Status: AC
  Administered 2016-01-21: 2 mg
  Filled 2016-01-21: qty 2

## 2016-01-21 MED ORDER — LATANOPROST 0.005 % OP SOLN
1.0000 [drp] | Freq: Every day | OPHTHALMIC | Status: DC
  Administered 2016-01-21 – 2016-01-28 (×8): 1 [drp] via OPHTHALMIC
  Filled 2016-01-21 (×2): qty 2.5

## 2016-01-21 MED ORDER — PANTOPRAZOLE SODIUM 40 MG PO TBEC
40.0000 mg | DELAYED_RELEASE_TABLET | Freq: Every day | ORAL | Status: DC
  Administered 2016-01-21 – 2016-01-29 (×9): 40 mg via ORAL
  Filled 2016-01-21 (×9): qty 1

## 2016-01-21 MED ORDER — BOOST HIGH PROTEIN PO LIQD: 1.0000 | Freq: Three times a day (TID) | ORAL | Status: DC

## 2016-01-21 MED ORDER — ACETAMINOPHEN 650 MG RE SUPP: 650.0000 mg | Freq: Four times a day (QID) | RECTAL | Status: DC | PRN

## 2016-01-21 MED ORDER — ONDANSETRON HCL 4 MG/2ML IJ SOLN: 4.0000 mg | Freq: Four times a day (QID) | INTRAMUSCULAR | Status: DC | PRN

## 2016-01-21 MED ORDER — ONDANSETRON HCL 4 MG PO TABS: 4.0000 mg | ORAL_TABLET | Freq: Four times a day (QID) | ORAL | Status: DC | PRN

## 2016-01-21 MED ORDER — VANCOMYCIN HCL IN DEXTROSE 750-5 MG/150ML-% IV SOLN
750.0000 mg | INTRAVENOUS | Status: DC
  Administered 2016-01-22 – 2016-01-23 (×2): 750 mg via INTRAVENOUS
  Filled 2016-01-21 (×2): qty 150

## 2016-01-21 MED ORDER — FEBUXOSTAT 40 MG PO TABS
40.0000 mg | ORAL_TABLET | Freq: Every day | ORAL | Status: DC
  Administered 2016-01-21 – 2016-01-29 (×9): 40 mg via ORAL
  Filled 2016-01-21 (×9): qty 1

## 2016-01-21 MED ORDER — SENNA 8.6 MG PO TABS
1.0000 | ORAL_TABLET | Freq: Two times a day (BID) | ORAL | Status: DC
  Administered 2016-01-21 – 2016-01-29 (×15): 8.6 mg via ORAL

## 2016-01-21 MED ORDER — DEXTROSE 5 % IV SOLN
1.0000 g | INTRAVENOUS | Status: DC
  Administered 2016-01-21 – 2016-01-25 (×5): 1 g via INTRAVENOUS
  Filled 2016-01-21 (×6): qty 1

## 2016-01-21 MED ORDER — PRO-STAT SUGAR FREE PO LIQD
30.0000 mL | Freq: Three times a day (TID) | ORAL | Status: DC
  Administered 2016-01-22 – 2016-01-29 (×13): 30 mL via ORAL
  Filled 2016-01-21 (×26): qty 30

## 2016-01-21 MED ORDER — VANCOMYCIN HCL IN DEXTROSE 1-5 GM/200ML-% IV SOLN
1000.0000 mg | Freq: Once | INTRAVENOUS | Status: AC
  Administered 2016-01-21: 1000 mg via INTRAVENOUS
  Filled 2016-01-21: qty 200

## 2016-01-21 MED ORDER — SODIUM CHLORIDE 0.9 % IV SOLN
INTRAVENOUS | Status: DC
  Administered 2016-01-21: 23:00:00 via INTRAVENOUS

## 2016-01-21 MED ORDER — DEXTROSE 5 % IV SOLN
10.0000 mg/kg | Freq: Two times a day (BID) | INTRAVENOUS | Status: DC
  Administered 2016-01-21: 625 mg via INTRAVENOUS
  Filled 2016-01-21: qty 12.5

## 2016-01-21 MED ORDER — ENOXAPARIN SODIUM 30 MG/0.3ML ~~LOC~~ SOLN
30.0000 mg | SUBCUTANEOUS | Status: DC
  Administered 2016-01-21 – 2016-01-28 (×8): 30 mg via SUBCUTANEOUS
  Filled 2016-01-21 (×10): qty 0.3

## 2016-01-21 MED ORDER — DONEPEZIL HCL 10 MG PO TABS
10.0000 mg | ORAL_TABLET | Freq: Every day | ORAL | Status: DC
  Administered 2016-01-21 – 2016-01-28 (×8): 10 mg via ORAL
  Filled 2016-01-21 (×10): qty 1

## 2016-01-21 MED ORDER — VITAMIN C 500 MG PO TABS
500.0000 mg | ORAL_TABLET | Freq: Two times a day (BID) | ORAL | Status: DC
  Administered 2016-01-21 – 2016-01-29 (×17): 500 mg via ORAL
  Filled 2016-01-21 (×19): qty 1

## 2016-01-21 MED ORDER — BOOST PLUS PO LIQD
237.0000 mL | Freq: Three times a day (TID) | ORAL | Status: DC
  Administered 2016-01-21 – 2016-01-29 (×14): 237 mL via ORAL
  Filled 2016-01-21 (×26): qty 237

## 2016-01-21 MED ORDER — METRONIDAZOLE IN NACL 5-0.79 MG/ML-% IV SOLN
500.0000 mg | Freq: Once | INTRAVENOUS | Status: AC
  Administered 2016-01-21: 500 mg via INTRAVENOUS
  Filled 2016-01-21: qty 100

## 2016-01-21 MED ORDER — SODIUM CHLORIDE 0.9% FLUSH
10.0000 mL | INTRAVENOUS | Status: DC | PRN
  Administered 2016-01-26 – 2016-01-29 (×6): 10 mL
  Filled 2016-01-21 (×6): qty 40

## 2016-01-21 MED ORDER — INSULIN ASPART 100 UNIT/ML ~~LOC~~ SOLN: 0.0000 [IU] | Freq: Three times a day (TID) | SUBCUTANEOUS | Status: DC

## 2016-01-21 MED ORDER — MIRTAZAPINE 7.5 MG PO TABS
7.5000 mg | ORAL_TABLET | Freq: Every day | ORAL | Status: DC
  Administered 2016-01-21 – 2016-01-28 (×8): 7.5 mg via ORAL
  Filled 2016-01-21 (×10): qty 1

## 2016-01-21 MED ORDER — SODIUM CHLORIDE 0.9% FLUSH
10.0000 mL | Freq: Two times a day (BID) | INTRAVENOUS | Status: DC
  Administered 2016-01-22 – 2016-01-27 (×4): 10 mL

## 2016-01-21 MED ORDER — ACETAMINOPHEN 325 MG PO TABS
650.0000 mg | ORAL_TABLET | Freq: Four times a day (QID) | ORAL | Status: DC | PRN
  Administered 2016-01-22 – 2016-01-27 (×3): 650 mg via ORAL
  Filled 2016-01-21 (×3): qty 2

## 2016-01-21 MED ORDER — SODIUM CHLORIDE 0.9 % IV BOLUS (SEPSIS)
1000.0000 mL | Freq: Once | INTRAVENOUS | Status: AC
  Administered 2016-01-21: 1000 mL via INTRAVENOUS

## 2016-01-21 MED ORDER — CEFEPIME HCL 2 G IJ SOLR: 2.0000 g | INTRAMUSCULAR | Status: DC

## 2016-01-21 MED ORDER — ADULT MULTIVITAMIN W/MINERALS CH
1.0000 | ORAL_TABLET | Freq: Every day | ORAL | Status: DC
  Administered 2016-01-21 – 2016-01-29 (×9): 1 via ORAL
  Filled 2016-01-21 (×9): qty 1

## 2016-01-21 NOTE — ED Provider Notes (Signed)
Pt signed out to me at shift change. Pt with foot infection, recently treated with iv/PICC line antibotics, brought to ED with complaint of AMS. Pt with elevated WBC of  23. Prior provider tried calling hospitalist for admission who required LP prior to accepting pt. CT head pending.   7:21 AM CT head negative. I went to speak to the patient's daughter. I explained to her that her CT scan was unremarkable. Patient's daughter is very upset that we did a CT scan. 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 She seemed very upset about as doing this test. I explained why we did it. She then asked to see who was going to do lumbar puncture when I told her was me, she expressed her discontent. I will get a physician to attempt her lumbar puncture.  Jeannett Senior, PA-C 01/21/16 [place], MD 01/21/16 [phone]

## 2016-01-21 NOTE — H&P (Signed)
History and Physical:    Shari Mcmahon   [id number] DOB: 1927-10-18 DOA: 01/21/2016  Referring MD/provider: Dr. Venora Maples PCP: Gildardo Cranker, DO   Chief Complaint: Lethargy and fever  History of Present Illness:   Shari Mcmahon is an 82 y.o. female with PMH of dementia, severe protein calorie malnutrition, dysphasia, recurrent UTIs, type 2 diabetes and peripheral vascular disease (not a candidate for revascularization) with osteomyelitis of the left calcaneus treated with IV antibiotics via a PICC line (which was apparently placed on or about 01/04/16 according to progress notes, and received 14 days of IV cefepime), who was brought to the hospital for evaluation from her SNF secondary to lethargy and changes in her mental status. The history is gleaned entirely from the chart and her daughter who is at the bedside as the patient has dementia and cannot provide any details. The only complaint she has are a burning sensation to her bilateral feet and her sacral area. Apparently, the patient was referred for admission overnight, but the admitting M.D. wanted her to have an LP before he would accept her. She was reassessed by the ED physician this morning and is more alert. [name], who is a Marine scientist, did not feel she needed an LP given multiple other potential sources of infection.  ROS:   Review of Systems  Unable to perform ROS: dementia   Past Medical History:   Past Medical History  Diagnosis Date  . ABDOMINAL PAIN, CHRONIC 04/07/2008  . ARTHRITIS 03/03/2008  . BRADYCARDIA, CHRONIC 12/17/2008  . CARDIAC MURMUR 02/01/2010  . CONSTIPATION, CHRONIC 10/15/2010  . DEGENERATIVE JOINT DISEASE, KNEES, BILATERAL 12/07/2007  . DEPRESSION 06/17/2009  . DIABETES MELLITUS, TYPE II 09/19/2007    DIET CONTROL   . DYSPHAGIA UNSPECIFIED 12/24/2009  . Gastroparesis 12/24/2009  . GENERALIZED OSTEOARTHROSIS INVOLVING HAND 10/03/2007  . GLAUCOMA 09/19/2007  . GOUTY ARTHROPATHY UNSPECIFIED  02/17/2009  . GOUT 09/19/2007  . HYPERLIPIDEMIA 01/27/2010  . HYPERTENSION 09/19/2007  . LUNG NODULE 03/03/2008  . MENOPAUSAL DISORDER 12/17/2008  . PERIPHERAL NEUROPATHY 06/19/2008  . PERSONAL HISTORY MALIGNANT NEOPLASM STOMACH 09/19/2007  . 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 STOMACH CANCER 03/03/2008  . UTI 12/15/2009  . Arthritis of knee, degenerative 10/19/2011    Past Surgical History:   Past Surgical History  Procedure Laterality Date  . Vagotomy    . Partial gastrectomy    . Billroth ii gastorenterostomy    . Colonoscopy      Social History:   Social History   Social History  . Marital Status: Widowed    Spouse Name: N/A  . Number of Children: 6  . Years of Education: 8   Occupational History  . retired    Social History Main Topics  . Smoking status: Former Smoker    Types: Cigarettes  . Smokeless tobacco: Never Used     Comment: use to smoke a pack of cigaretts a day for 25 yrs. but stopped 10 yrs. ago.   . Alcohol Use: No  . Drug Use: No  . Sexual Activity: Not on file   Other Topics Concern  . Not on file   Social History Narrative   Lives with her daughter    Family history:   Family History  Problem Relation Age of Onset  . Uterine cancer Mother   . Stroke Father     Hemorrhagic  . Cancer Sister     breast cancer and one sister with uterine  cancer and one sister with ovarian cancerr  . Colon cancer Maternal Grandmother   . Colon cancer Paternal Grandfather   . Diabetes Other   . Stomach cancer Neg Hx   . Prostate cancer Brother     Allergies   Voltaren; Dulcolax; Duloxetine hcl; Penicillins; and Timolol  Current Medications:   Prior to Admission medications   Medication Sig Start Date End Date Taking? Authorizing Provider  Amino Acids-Protein Hydrolys (FEEDING SUPPLEMENT, PRO-STAT SUGAR FREE 64,) LIQD Take 30 mLs by mouth 3 (three) times daily with meals. 11/15/15  Yes Gerlene Fee, NP  ceFEPIme 2 g in  dextrose 5 % 50 mL Inject 2 g into the vein every 12 (twelve) hours. For 14 days   Yes Historical Provider, MD  colchicine 0.6 MG tablet Take 1 tablet (0.6 mg total) by mouth daily as needed. 10/29/15  Yes Gerlene Fee, NP  divalproex (DEPAKOTE SPRINKLES) 125 MG capsule Take 1 capsule (125 mg total) by mouth 2 (two) times daily. 06/17/15  Yes Biagio Borg, MD  donepezil (ARICEPT) 10 MG tablet Take 10 mg by mouth at bedtime.   Yes Historical Provider, MD  febuxostat (ULORIC) 40 MG tablet Take 40 mg by mouth daily.   Yes Historical Provider, MD  feeding supplement (BOOST HIGH PROTEIN) LIQD Take 1 Container by mouth 3 (three) times daily between meals.   Yes Historical Provider, MD  HYDROcodone-acetaminophen (NORCO/VICODIN) 5-325 MG tablet Take one tablet by mouth every 8 hours for pain. Not to exceed 3000mg  of APAP form all sources/24hr 09/04/15  Yes Tiffany L Reed, DO  insulin aspart (NOVOLOG) 100 UNIT/ML injection Inject 3 Units into the skin 3 (three) times daily before meals. For cbg >=150   Yes Historical Provider, MD  latanoprost (XALATAN) 0.005 % ophthalmic solution Place 1 drop into both eyes at bedtime.  07/28/14  Yes Historical Provider, MD  magnesium hydroxide (MILK OF MAGNESIA) 400 MG/5ML suspension Take 30 mLs by mouth daily.   Yes Historical Provider, MD  memantine (NAMENDA XR) 28 MG CP24 24 hr capsule Take 28 mg by mouth daily.   Yes Historical Provider, MD  mirtazapine (REMERON) 7.5 MG tablet Take 7.5 mg by mouth at bedtime.   Yes Historical Provider, MD  Multiple Vitamin tablet Take 1 tablet by mouth daily.   Yes Historical Provider, MD  omeprazole (PRILOSEC) 40 MG capsule Take 1 capsule (40 mg total) by mouth daily. 08/15/14  Yes Milus Banister, MD  oseltamivir (TAMIFLU) 75 MG capsule Take 75 mg by mouth daily. For 14 days   Yes Historical Provider, MD  senna (SENOKOT) 8.6 MG tablet Take 1 tablet by mouth 2 (two) times daily. Reported on 10/29/2015   Yes Historical Provider, MD    vitamin C (ASCORBIC ACID) 500 MG tablet Take 500 mg by mouth 2 (two) times daily.   Yes Historical Provider, MD    Physical Exam:   Filed Vitals:   01/21/16 0545 01/21/16 0640 01/21/16 0816 01/21/16 0817  BP: 112/42 132/52 107/52   Pulse: 65 63 59   Temp:   98.4 F (36.9 C)   TempSrc:   Oral   Resp:  16 14   Height:    5\' 6"  (1.676 m)  Weight:    62.596 kg (138 lb)  SpO2: 99% 98% 100%      Physical Exam: Blood pressure 107/52, pulse 59, temperature 98.4 F (36.9 C), temperature source Oral, resp. rate 14, height 5\' 6"  (1.676 m), weight 62.596 kg (138  lb), SpO2 100 %. Gen: No acute distress. Awake, alert. Head: Normocephalic, atraumatic. Eyes: PERRL, EOMI, sclerae nonicteric. Mouth: Oropharynx reveals moist mucous membranes, edentulous upper jaw, fair dentition lower jaw. Neck: Supple, no thyromegaly, no lymphadenopathy, no jugular venous distention. Chest: Lungs clear to ascultation bilaterally. CV: Heart sounds are regular with a grade II/VI systolic ejection murmur. Abdomen: Soft, nontender, nondistended with normal active bowel sounds. Extremities: Extremities are without clubbing, edema or cyanosis.  Dry skin.  Right heel with scabbed area, but well healed. Skin: Warm and dry. Neuro: Alert and oriented to self. Grossly nonfocal. Psych: Mood and affect normal.   Data Review:    Labs: Basic Metabolic Panel:  Recent Labs Lab 01/21/16 0226  NA 142  K 4.3  CL 106  GLUCOSE 138*  BUN 45*  CREATININE 1.30*   Liver Function Tests:  Recent Labs Lab 01/21/16 0430  AST 20  ALT 9*  ALKPHOS 60  BILITOT 0.3  PROT 6.1*  ALBUMIN 2.0*   CBC:  Recent Labs Lab 01/21/16 0212 01/21/16 0226  WBC 23.6*  --   NEUTROABS 18.9*  --   HGB 8.7* 10.9*  HCT 26.6* 32.0*  MCV 98.2  --   PLT 364  --     Radiographic Studies: Dg Chest 2 View  01/21/2016  CLINICAL DATA:  Fever.  Altered mental status. EXAM: CHEST  2 VIEW COMPARISON:  06/16/2015 FINDINGS: Shallow  inspiration with atelectasis in the lung bases. Mild cardiac enlargement with mild vascular congestion. Interstitial changes in the lungs likely represent edema. Possible superimposed infiltration in the right lung base. Calcified and tortuous aorta. No pneumothorax. Left PICC catheter with tip over the low SVC region. Small bilateral pleural effusions. IMPRESSION: Cardiac enlargement with mild pulmonary vascular congestion and interstitial edema. Small pleural effusions. Possible superimposed consolidation in the right lung base. Electronically Signed   By: Lucienne Capers M.D.   On: 01/21/2016 03:09   Dg Pelvis 1-2 Views  01/21/2016  CLINICAL DATA:  Osteonecrosis of both feet. Ulcers in the feet. Decubitus ulcer to the buttocks. EXAM: PELVIS - 1-2 VIEW COMPARISON:  CT abdomen and pelvis 12/25/2014 FINDINGS: Diffuse bone demineralization. Visualization of pelvis is limited due to overlying stool. No definite evidence of any acute displaced fracture. Degenerative changes in the lower lumbar spine and left hip. Previous right hip arthroplasty, incompletely visualized. IMPRESSION: Diffuse bone demineralization. No acute displaced fractures. Overlying stool limits visualization. Electronically Signed   By: Lucienne Capers M.D.   On: 01/21/2016 04:36   Ct Head Wo Contrast  01/21/2016  CLINICAL DATA:  Initial valuation for acute altered mental status. Fever. EXAM: CT HEAD WITHOUT CONTRAST TECHNIQUE: Contiguous axial images were obtained from the base of the skull through the vertex without intravenous contrast. COMPARISON:  None. FINDINGS: Diffuse prominence of the CSF containing spaces is compatible with generalized cerebral atrophy. Patchy hypodensity within the periventricular and deep white matter both cerebral hemispheres most consistent with chronic small vessel ischemic disease. No acute intracranial hemorrhage. No large vessel territory infarct. No mass lesion, midline shift, or mass effect. No  hydrocephalus. No extra-axial fluid collection para Scalp soft tissues within normal limits. No acute abnormality about the orbits. Paranasal sinuses are clear. No mastoid effusion. Calvarium intact. IMPRESSION: 1. No acute intracranial process. 2. Age-related cerebral atrophy with mild chronic small vessel ischemic disease. Electronically Signed   By: Jeannine Boga M.D.   On: 01/21/2016 06:56   Dg Foot 2 Views Right  01/21/2016  CLINICAL DATA:  Ulcers  to the plantar surface and toes of both feet. EXAM: RIGHT FOOT - 2 VIEW COMPARISON:  None. FINDINGS: Diffuse bone demineralization. Suggestion of bone loss along the fourth and fifth metatarsal heads may indicate osteomyelitis. MRI would be more sensitive for evaluation of osteomyelitis if clinically indicated. No acute fracture or dislocation. Soft tissue swelling most prominent over the hindfoot region. Vascular calcifications. IMPRESSION: Diffuse bone demineralization. Suggestion of bone loss over the fourth and fifth metatarsal heads which may indicate changes of osteomyelitis. Electronically Signed   By: Lucienne Capers M.D.   On: 01/21/2016 04:40   Dg Foot Complete Left  01/21/2016  CLINICAL DATA:  Ulcers of the plantar surface and toes of both feet. EXAM: LEFT FOOT - COMPLETE 3+ VIEW COMPARISON:  None. FINDINGS: Diffuse bone demineralization. No acute fracture or dislocation. No definite bone erosion or cortical changes to suggest osteomyelitis. However, MRI would be more sensitive for evaluation of osteomyelitis if clinically indicated. Vascular calcifications are present. Soft tissue swelling over the plantar aspect of the foot most prominent over the hindfoot region. IMPRESSION: Diffuse bone demineralization. No definite radiographic evidence of osteomyelitis. Electronically Signed   By: Lucienne Capers M.D.   On: 01/21/2016 04:39    EKG: Independently reviewed. Sinus rhythm at 62 bpm. Borderline T-wave changes in the inferior leads  (flattening).   Assessment/Plan:   Principal Problem:   HCAP (healthcare-associated pneumonia)In a patient with dysphasia and Alzheimer's dementia - Chest x-ray showed right lung base infiltrate. Patient with known history of dysphasia. - Likely an aspiration pneumonia in a patient at risk for resistant organisms. - We'll obtain blood cultures, sputum culture, strep pneumonia antigen and influenza PCR. - Patient has are ready received Tamiflu as an outpatient, so would hold further Tamiflu for now. - Treat empirically with cefepime and vancomycin to cover resistant organisms. - Speech therapy evaluation requested.  Active Problems:   Essential hypertension - Not currently on antihypertensives, and blood pressure controlled.    Alzheimer's dementia - Continue Aricept, Remeron and Depakote.    Diabetes mellitus, type 2 (HCC) - Insulin sensitive SSI ordered.    Peripheral autonomic neuropathy due to diabetes mellitus ([place]) - Unfortunately, has not tolerated Neurontin due to excessive lethargy.    Type II diabetes mellitus with peripheral circulatory disorder (HCC) - Not a candidate for revascularization.    Pressure ulcer/ Ulcer of left heel and midfoot with necrosis of muscle ([place]) - Wound care consultation requested.    DVT prophylaxis - Lovenox ordered.  Code Status / Family Communication / Disposition Plan:   Code Status: DO NOT RESUSCITATE Family Communication: POA: Daughter, Guadalupe Dawn at the bedside.  [phone]) Disposition Plan: From [facility]. She can return there when stable, likely in 3-4 days.   Time spent: 1 hour.  Sarh Kirschenbaum [facility] Pager [phone] Cell: [phone]   If 7PM-7AM, please contact night-coverage [email or web address] Password [facility] 01/21/2016, 10:25 AM

## 2016-01-21 NOTE — Progress Notes (Addendum)
Pharmacy Antibiotic Note  Shari Mcmahon is a 82 y.o. female admitted on 01/21/2016 with bacteremia.  Pharmacy has been consulted for cefepime dosing.  Plan: Cefepime 2gm IV q24h Acyclovir 625 mg IV q12h F/u Scr     No data recorded.   Recent Labs Lab 01/21/16 0212 01/21/16 0226 01/21/16 0228 01/21/16 0450  WBC 23.6*  --   --   --   CREATININE  --  1.30*  --   --   LATICACIDVEN  --   --  1.97 1.19    Estimated Creatinine Clearance: 27.4 mL/min (by C-G formula based on Cr of 1.3).    Allergies  Allergen Reactions  . Voltaren [Diclofenac Sodium] Other (See Comments)    Stomach irritation  . Dulcolax [Bisacodyl]     Unknown   . Duloxetine Hcl Other (See Comments)    Made patient not want to eat or drink  . Penicillins Swelling    Unknown   . Timolol     REACTION: bradycardia worse    Antimicrobials this admission: 3/23 cefepime >>  On PTA started 3/9 3/23 flagyl >> x1 ED 3/23 vancomycin >> x1 ED 3/23 acyclovir >>  Dose adjustments this admission:   Microbiology results:  BCx:   UCx:    Sputum:    MRSA PCR:   Thank you for allowing pharmacy to be a part of this patient's care.  Dorrene German 01/21/2016 6:09 AM

## 2016-01-21 NOTE — ED Provider Notes (Signed)
9:14 AM Patient just completed a course of antibiotics for a left calcaneal osteomyelitis. She presents to [place] from a nursing facility with reports of fever and decreased level of consciousness. Workup in the emergency department demonstrates acute kidney injury as well as 6-30 white blood cells in her urine showed possible urinary tract infection. Chest x-ray shows possible developing infiltrate which is consistent with her congestion family as described. She also has a PICC line in her left upper extremity without surrounding signs of infection. She is at high risk for bacterial bloodstream infection because of this. Rectal temperature in the emergency department now is 98.4. She couldn't answer questions and she tells me that her heels and bottom hurts. She does have a sacral decubitus ulcer without surrounding signs of infection. Initial plan was for admission to hospitalist who was concerned about the possibility of meningitis requested lumbar puncture prior to accepting the patient.  At this time I do not believe the patient has meningitis. I think it puts the patient at higher risk of infection and possible infection of an already sterile . Abdomen there are several alternatives to be causing her fever and she's been covered with vancomycin and cefepime at this time. Lungs discussion with family about this. The patient is a DO NOT RESUSCITATE patient. She otherwise wants aggressive care besides CPR and mechanical ventilation and pressors.  I personally reviewed the imaging tests through PACS system I reviewed available ER/hospitalization records through the [place], MD 01/21/16 [phone]

## 2016-01-21 NOTE — Consult Note (Signed)
WOC wound consult note Reason for Consult: pressure injuries, arterial insufficiency (seen by Dr. Scot Dock) with dry gangrene  Wound type:Pressure Pressure Ulcer POA: Yes Measurement:sacral:  1.5cm x 1cm x 0.4cm in area of previous wound healing (scarring) measuring 6cm x 3.5cm. Stage 3. Pink, moist wound bed with serous exudate (small).  Dry gangrene on right foot, 4th digit and callous on left foot, lateral aspect of 5th digit measuring 2.5cm x 2cm. Right posterior LE with full thickness wound measuring 1.5cm x 1cm x 0.2cm with small to moderate serous exudate. Wound bed:AS described above. Drainage (amount, consistency, odor) As described above Periwound:intact, dry. Dressing procedure/placement/frequency:I will continue present treatment, with a calcium alginate dressing topped with a silicone foam, but increase dressing changes to daily so that we can observe progress.  A mattress replacement, new Prevalon pressure redistribution heel boots and a chair cushion for her use when OOB in chair are provided.  Patient has urinary incontinence. [facility] nursing team will not follow, but will remain available to this patient, the nursing and medical teams.  Please re-consult if needed. Thanks, Maudie Flakes, MSN, RN, [place], Arther Abbott  Pager# [phone]

## 2016-01-21 NOTE — ED Notes (Signed)
The LPN at the facility states that the patient was altered when they woke her up to check on her. Pt has dementia and alzheimers

## 2016-01-21 NOTE — ED Notes (Signed)
Bed: [id number] Expected date:  Expected time:  Means of arrival:  Comments: Decreased loc, dementia

## 2016-01-21 NOTE — Progress Notes (Signed)
Pharmacy Antibiotic Note  Shari Mcmahon is a 82 y.o. female admitted on 01/21/2016 with pneumonia (HCAP).  Pharmacy has been consulted for Vancomycin and Cefepime dosing.  Pt has a h/o recurrent UTIs and recently received Cefepime 2g q12h 3/9 >> 3/22 (14 day course) per [facility] MAR for osteomyelitis of left heel.  Upon presentation, meningitis was being considered so acyclovir started but was quickly ruled out.    Plan: Vancomycin 750 mg IV every 24 hours.  Goal trough 15-20 mcg/mL.  Cefepime 1g IV q24h for CrCl<30 ml/min.  Height: 5\' 6"  (167.6 cm) Weight: 138 lb (62.596 kg) (per daughter) IBW/kg (Calculated) : 59.3  Temp (24hrs), Avg:98.5 F (36.9 C), Min:98.4 F (36.9 C), Max:98.6 F (37 C)   Recent Labs Lab 01/21/16 0212 01/21/16 0226 01/21/16 0228 01/21/16 0450  WBC 23.6*  --   --   --   CREATININE  --  1.30*  --   --   LATICACIDVEN  --   --  1.97 1.19    Estimated Creatinine Clearance: 28.5 mL/min (by C-G formula based on Cr of 1.3).    Allergies  Allergen Reactions  . Voltaren [Diclofenac Sodium] Other (See Comments)    Stomach irritation  . Dulcolax [Bisacodyl]     Unknown   . Duloxetine Hcl Other (See Comments)    Made patient not want to eat or drink  . Penicillins Swelling    Unknown   . Timolol     REACTION: bradycardia worse    Antimicrobials this admission: 3/9 cefepime >>  3/23 flagyl x 1  3/23 vancomycin >> 3/23 acyclovir >> 3/23  Dose adjustments this admission: -  Microbiology results: 3/23 BCx: sent 3/23 UCx: sent   Thank you for allowing pharmacy to be a part of this patient's care.  Hershal Coria 01/21/2016 11:49 AM

## 2016-01-21 NOTE — ED Provider Notes (Signed)
CSN: [id number]     Arrival date & time 01/21/16  0140 History   First MD Initiated Contact with Patient 01/21/16 0147     Chief Complaint  Patient presents with  . Altered Mental Status     (Consider location/radiation/quality/duration/timing/severity/associated sxs/prior Treatment) HPI Comments: This is an 82 year old female who lives in a nursing home with a history of Alzheimer's and dementia she was awakened just prior to arrival for a wellness check she was found to be altered and febrile she was sent for emergency department evaluation per the report vital signs were and O2 sat of 90% and temperature of 102 and low blood pressure of 87.  Patient is a 82 y.o. female presenting with altered mental status. The history is provided by the nursing home. The history is limited by the condition of the patient.  Altered Mental Status Presenting symptoms: behavior changes   Severity:  Moderate Most recent episode:  Today Episode history:  Unable to specify Chronicity:  New Context: dementia and nursing home resident   Associated symptoms: fever   Associated symptoms: no vomiting     Past Medical History  Diagnosis Date  . ABDOMINAL PAIN, CHRONIC 04/07/2008  . ARTHRITIS 03/03/2008  . BRADYCARDIA, CHRONIC 12/17/2008  . CARDIAC MURMUR 02/01/2010  . CONSTIPATION, CHRONIC 10/15/2010  . DEGENERATIVE JOINT DISEASE, KNEES, BILATERAL 12/07/2007  . DEPRESSION 06/17/2009  . DIABETES MELLITUS, TYPE II 09/19/2007    DIET CONTROL   . DYSPHAGIA UNSPECIFIED 12/24/2009  . Gastroparesis 12/24/2009  . GENERALIZED OSTEOARTHROSIS INVOLVING HAND 10/03/2007  . GLAUCOMA 09/19/2007  . GOUTY ARTHROPATHY UNSPECIFIED 02/17/2009  . GOUT 09/19/2007  . HYPERLIPIDEMIA 01/27/2010  . HYPERTENSION 09/19/2007  . LUNG NODULE 03/03/2008  . MENOPAUSAL DISORDER 12/17/2008  . PERIPHERAL NEUROPATHY 06/19/2008  . PERSONAL HISTORY MALIGNANT NEOPLASM STOMACH 09/19/2007  . 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  STOMACH CANCER 03/03/2008  . UTI 12/15/2009  . Arthritis of knee, degenerative 10/19/2011   Past Surgical History  Procedure Laterality Date  . Vagotomy    . Partial gastrectomy    . Billroth ii gastorenterostomy    . Colonoscopy     Family History  Problem Relation Age of Onset  . Uterine cancer Mother   . Stroke Father     Hemorrhagic  . Cancer Sister     breast cancer and one sister with uterine cancer and one sister with ovarian cancerr  . Colon cancer Maternal Grandmother   . Colon cancer Paternal Grandfather   . Diabetes Other   . Stomach cancer Neg Hx   . Prostate cancer Brother    Social History  Substance Use Topics  . Smoking status: Former Smoker    Types: Cigarettes  . Smokeless tobacco: Never Used     Comment: use to smoke a pack of cigaretts a day for 25 yrs. but stopped 10 yrs. ago.   . Alcohol Use: No   OB History    No data available     Review of Systems  Unable to perform ROS: Dementia  Constitutional: Positive for fever.  Respiratory: Negative for cough.   Gastrointestinal: Negative for vomiting.  Musculoskeletal: Positive for back pain.  Skin: Positive for wound.  All other systems reviewed and are negative.     Allergies  Voltaren; Dulcolax; Duloxetine hcl; Penicillins; and Timolol  Home Medications   Prior to Admission medications   Medication Sig Start Date End Date Taking? Authorizing Provider  Amino Acids-Protein Hydrolys (FEEDING SUPPLEMENT, PRO-STAT SUGAR FREE  64,) LIQD Take 30 mLs by mouth 3 (three) times daily with meals. 11/15/15  Yes Gerlene Fee, NP  ceFEPIme 2 g in dextrose 5 % 50 mL Inject 2 g into the vein every 12 (twelve) hours. For 14 days   Yes Historical Provider, MD  colchicine 0.6 MG tablet Take 1 tablet (0.6 mg total) by mouth daily as needed. 10/29/15  Yes Gerlene Fee, NP  divalproex (DEPAKOTE SPRINKLES) 125 MG capsule Take 1 capsule (125 mg total) by mouth 2 (two) times daily. 06/17/15  Yes Biagio Borg, MD   donepezil (ARICEPT) 10 MG tablet Take 10 mg by mouth at bedtime.   Yes Historical Provider, MD  febuxostat (ULORIC) 40 MG tablet Take 40 mg by mouth daily.   Yes Historical Provider, MD  feeding supplement (BOOST HIGH PROTEIN) LIQD Take 1 Container by mouth 3 (three) times daily between meals.   Yes Historical Provider, MD  HYDROcodone-acetaminophen (NORCO/VICODIN) 5-325 MG tablet Take one tablet by mouth every 8 hours for pain. Not to exceed 3000mg  of APAP form all sources/24hr 09/04/15  Yes Tiffany L Reed, DO  insulin aspart (NOVOLOG) 100 UNIT/ML injection Inject 3 Units into the skin 3 (three) times daily before meals. For cbg >=150   Yes Historical Provider, MD  latanoprost (XALATAN) 0.005 % ophthalmic solution Place 1 drop into both eyes at bedtime.  07/28/14  Yes Historical Provider, MD  magnesium hydroxide (MILK OF MAGNESIA) 400 MG/5ML suspension Take 30 mLs by mouth daily.   Yes Historical Provider, MD  memantine (NAMENDA XR) 28 MG CP24 24 hr capsule Take 28 mg by mouth daily.   Yes Historical Provider, MD  mirtazapine (REMERON) 7.5 MG tablet Take 7.5 mg by mouth at bedtime.   Yes Historical Provider, MD  Multiple Vitamin tablet Take 1 tablet by mouth daily.   Yes Historical Provider, MD  omeprazole (PRILOSEC) 40 MG capsule Take 1 capsule (40 mg total) by mouth daily. 08/15/14  Yes Milus Banister, MD  oseltamivir (TAMIFLU) 75 MG capsule Take 75 mg by mouth daily. For 14 days   Yes Historical Provider, MD  senna (SENOKOT) 8.6 MG tablet Take 1 tablet by mouth 2 (two) times daily. Reported on 10/29/2015   Yes Historical Provider, MD  vitamin C (ASCORBIC ACID) 500 MG tablet Take 500 mg by mouth 2 (two) times daily.   Yes Historical Provider, MD   BP 152/46 mmHg  Pulse 65  Temp(Src) 98 F (36.7 C) (Oral)  Resp 15  Ht 5\' 6"  (1.676 m)  Wt 62.596 kg  BMI 22.28 kg/m2  SpO2 100% Physical Exam  Constitutional: She appears well-developed and well-nourished.  HENT:  Head: Normocephalic.   Eyes: Pupils are equal, round, and reactive to light.  Neck: Normal range of motion.  Cardiovascular: Normal rate and regular rhythm.   Pulmonary/Chest: Effort normal and breath sounds normal. No respiratory distress.  Abdominal: Soft. She exhibits no distension. There is no tenderness.  Genitourinary:  stage 3 decubi coccyx  Musculoskeletal:  Multiple ulcers of toe R foot, decubi posterior R calf  Skin: Skin is warm and dry.  Nursing note and vitals reviewed.   ED Course  Procedures (including critical care time) Labs Review Labs Reviewed  CBC WITH DIFFERENTIAL/PLATELET - Abnormal; Notable for the following:    WBC 23.6 (*)    RBC 2.71 (*)    Hemoglobin 8.7 (*)    HCT 26.6 (*)    RDW 19.6 (*)    Neutro Abs 18.9 (*)  Monocytes Absolute 1.8 (*)    All other components within normal limits  URINALYSIS, ROUTINE W REFLEX MICROSCOPIC (NOT AT [facility]) - Abnormal; Notable for the following:    APPearance CLOUDY (*)    Hgb urine dipstick MODERATE (*)    Protein, ur 100 (*)    Leukocytes, UA MODERATE (*)    All other components within normal limits  SEDIMENTATION RATE - Abnormal; Notable for the following:    Sed Rate 80 (*)    All other components within normal limits  C-REACTIVE PROTEIN - Abnormal; Notable for the following:    CRP 11.8 (*)    All other components within normal limits  URINE MICROSCOPIC-ADD ON - Abnormal; Notable for the following:    Squamous Epithelial / LPF 0-5 (*)    Bacteria, UA FEW (*)    All other components within normal limits  HEPATIC FUNCTION PANEL - Abnormal; Notable for the following:    Total Protein 6.1 (*)    Albumin 2.0 (*)    ALT 9 (*)    Bilirubin, Direct <0.1 (*)    All other components within normal limits  CREATININE, SERUM - Abnormal; Notable for the following:    Creatinine, Ser 1.35 (*)    GFR calc non Af Amer 34 (*)    GFR calc Af Amer 40 (*)    All other components within normal limits  GLUCOSE, CAPILLARY - Abnormal; Notable  for the following:    Glucose-Capillary 127 (*)    All other components within normal limits  I-STAT CHEM 8, ED - Abnormal; Notable for the following:    BUN 45 (*)    Creatinine, Ser 1.30 (*)    Glucose, Bld 138 (*)    Calcium, Ion 1.38 (*)    Hemoglobin 10.9 (*)    HCT 32.0 (*)    All other components within normal limits  CULTURE, BLOOD (ROUTINE X 2)  MRSA PCR SCREENING  CULTURE, BLOOD (ROUTINE X 2)  URINE CULTURE  CULTURE, EXPECTORATED SPUTUM-ASSESSMENT  GRAM STAIN  INFLUENZA PANEL BY PCR (TYPE A & B, H1N1)  GLUCOSE, CAPILLARY  STREP PNEUMONIAE URINARY ANTIGEN  BASIC METABOLIC PANEL  CBC  I-STAT CG4 LACTIC ACID, ED  I-STAT CG4 LACTIC ACID, ED    Imaging Review Dg Chest 2 View  01/21/2016  CLINICAL DATA:  Fever.  Altered mental status. EXAM: CHEST  2 VIEW COMPARISON:  06/16/2015 FINDINGS: Shallow inspiration with atelectasis in the lung bases. Mild cardiac enlargement with mild vascular congestion. Interstitial changes in the lungs likely represent edema. Possible superimposed infiltration in the right lung base. Calcified and tortuous aorta. No pneumothorax. Left PICC catheter with tip over the low SVC region. Small bilateral pleural effusions. IMPRESSION: Cardiac enlargement with mild pulmonary vascular congestion and interstitial edema. Small pleural effusions. Possible superimposed consolidation in the right lung base. Electronically Signed   By: Lucienne Capers M.D.   On: 01/21/2016 03:09   Dg Pelvis 1-2 Views  01/21/2016  CLINICAL DATA:  Osteonecrosis of both feet. Ulcers in the feet. Decubitus ulcer to the buttocks. EXAM: PELVIS - 1-2 VIEW COMPARISON:  CT abdomen and pelvis 12/25/2014 FINDINGS: Diffuse bone demineralization. Visualization of pelvis is limited due to overlying stool. No definite evidence of any acute displaced fracture. Degenerative changes in the lower lumbar spine and left hip. Previous right hip arthroplasty, incompletely visualized. IMPRESSION: Diffuse  bone demineralization. No acute displaced fractures. Overlying stool limits visualization. Electronically Signed   By: Lucienne Capers M.D.   On: 01/21/2016  04:36   Ct Head Wo Contrast  01/21/2016  CLINICAL DATA:  Initial valuation for acute altered mental status. Fever. EXAM: CT HEAD WITHOUT CONTRAST TECHNIQUE: Contiguous axial images were obtained from the base of the skull through the vertex without intravenous contrast. COMPARISON:  None. FINDINGS: Diffuse prominence of the CSF containing spaces is compatible with generalized cerebral atrophy. Patchy hypodensity within the periventricular and deep white matter both cerebral hemispheres most consistent with chronic small vessel ischemic disease. No acute intracranial hemorrhage. No large vessel territory infarct. No mass lesion, midline shift, or mass effect. No hydrocephalus. No extra-axial fluid collection para Scalp soft tissues within normal limits. No acute abnormality about the orbits. Paranasal sinuses are clear. No mastoid effusion. Calvarium intact. IMPRESSION: 1. No acute intracranial process. 2. Age-related cerebral atrophy with mild chronic small vessel ischemic disease. Electronically Signed   By: Jeannine Boga M.D.   On: 01/21/2016 06:56   Dg Foot 2 Views Right  01/21/2016  CLINICAL DATA:  Ulcers to the plantar surface and toes of both feet. EXAM: RIGHT FOOT - 2 VIEW COMPARISON:  None. FINDINGS: Diffuse bone demineralization. Suggestion of bone loss along the fourth and fifth metatarsal heads may indicate osteomyelitis. MRI would be more sensitive for evaluation of osteomyelitis if clinically indicated. No acute fracture or dislocation. Soft tissue swelling most prominent over the hindfoot region. Vascular calcifications. IMPRESSION: Diffuse bone demineralization. Suggestion of bone loss over the fourth and fifth metatarsal heads which may indicate changes of osteomyelitis. Electronically Signed   By: Lucienne Capers M.D.   On:  01/21/2016 04:40   Dg Foot Complete Left  01/21/2016  CLINICAL DATA:  Ulcers of the plantar surface and toes of both feet. EXAM: LEFT FOOT - COMPLETE 3+ VIEW COMPARISON:  None. FINDINGS: Diffuse bone demineralization. No acute fracture or dislocation. No definite bone erosion or cortical changes to suggest osteomyelitis. However, MRI would be more sensitive for evaluation of osteomyelitis if clinically indicated. Vascular calcifications are present. Soft tissue swelling over the plantar aspect of the foot most prominent over the hindfoot region. IMPRESSION: Diffuse bone demineralization. No definite radiographic evidence of osteomyelitis. Electronically Signed   By: Lucienne Capers M.D.   On: 01/21/2016 04:39   I have personally reviewed and evaluated these images and lab results as part of my medical decision-making.   EKG Interpretation   Date/Time:  Thursday January 21 2016 06:49:57 EDT Ventricular Rate:  62 PR Interval:  174 QRS Duration: 89 QT Interval:  424 QTC Calculation: 431 R Axis:   30 Text Interpretation:  Sinus rhythm Borderline T abnormalities, inferior  leads No acute changes Confirmed by Kathrynn Humble, MD, ANKIT [phone]) on  01/21/2016 7:20:00 AM      MDM   Final diagnoses:  Sepsis, due to unspecified organism ([place])  AKI (acute kidney injury) ([place])  HCAP (healthcare-associated pneumonia)         Junius Creamer, NP 01/21/16 1953  Varney Biles, MD 01/22/16 1708

## 2016-01-22 DIAGNOSIS — R627 Adult failure to thrive: Secondary | ICD-10-CM

## 2016-01-22 DIAGNOSIS — Z515 Encounter for palliative care: Secondary | ICD-10-CM | POA: Insufficient documentation

## 2016-01-22 LAB — GLUCOSE, CAPILLARY
GLUCOSE-CAPILLARY: 83 mg/dL (ref 65–99)
GLUCOSE-CAPILLARY: 112 mg/dL — AB (ref 65–99)
GLUCOSE-CAPILLARY: 102 mg/dL — AB (ref 65–99)
Glucose-Capillary: 99 mg/dL (ref 65–99)

## 2016-01-22 LAB — CBC
HEMATOCRIT: 22.5 % — AB (ref 36.0–46.0)
HEMOGLOBIN: 7.4 g/dL — AB (ref 12.0–15.0)
MCH: 31.8 pg (ref 26.0–34.0)
MCHC: 32.9 g/dL (ref 30.0–36.0)
MCV: 96.6 fL (ref 78.0–100.0)
Platelets: 258 10*3/uL (ref 150–400)
RBC: 2.33 MIL/uL — ABNORMAL LOW (ref 3.87–5.11)
RDW: 19.7 % — ABNORMAL HIGH (ref 11.5–15.5)
WBC: 20.4 10*3/uL — ABNORMAL HIGH (ref 4.0–10.5)

## 2016-01-22 LAB — BASIC METABOLIC PANEL
ANION GAP: 6 (ref 5–15)
BUN: 36 mg/dL — ABNORMAL HIGH (ref 6–20)
CALCIUM: 9.3 mg/dL (ref 8.9–10.3)
CO2: 24 mmol/L (ref 22–32)
Chloride: 112 mmol/L — ABNORMAL HIGH (ref 101–111)
Creatinine, Ser: 1.51 mg/dL — ABNORMAL HIGH (ref 0.44–1.00)
GFR calc Af Amer: 35 mL/min — ABNORMAL LOW (ref >60)
GFR calc non Af Amer: 30 mL/min — ABNORMAL LOW (ref >60)
GLUCOSE: 62 mg/dL — AB (ref 65–99)
Potassium: 4.3 mmol/L (ref 3.5–5.1)
Sodium: 142 mmol/L (ref 135–145)

## 2016-01-22 MED ORDER — ENSURE ENLIVE PO LIQD
237.0000 mL | Freq: Two times a day (BID) | ORAL | Status: DC
  Administered 2016-01-23 – 2016-01-29 (×13): 237 mL via ORAL

## 2016-01-22 MED ORDER — SODIUM CHLORIDE 0.9 % IV SOLN: INTRAVENOUS | Status: DC

## 2016-01-22 NOTE — Care Management Note (Signed)
Case Management Note  Patient Details  Name: Shari Mcmahon MRN: [id number] Date of Birth: Mar 13, 1927  Subjective/Objective:   Admitted with PNA                 Action/Plan: Discharge planning per CSW  Expected Discharge Date:   (UNKNOWN)               Expected Discharge Plan:  Skilled Nursing Facility  In-House Referral:  Clinical Social Work  Discharge planning Services  CM Consult  Post Acute Care Choice:  NA Choice offered to:  NA  DME Arranged:  N/A DME Agency:  NA  HH Arranged:  NA HH Agency:  NA  Status of Service:  Completed, signed off  Medicare Important Message Given:    Date Medicare IM Given:    Medicare IM give by:    Date Additional Medicare IM Given:    Additional Medicare Important Message give by:     If discussed at [place] of Stay Meetings, dates discussed:    Additional Comments:  Guadalupe Maple, RN 01/22/2016, 10:08 AM [phone]

## 2016-01-22 NOTE — Progress Notes (Signed)
Palliative consult request received Chart reviewed Met with patient, 2 daughters at the bedside They request for a family meeting when 4 of the patient's children are available.  Plan: [facility] family meeting scheduled for 3-25 at 1430.  Further recommendations and full note to follow.   Loistine Chance MD [facility] health palliative medicine team [phone] office  [phone] pager

## 2016-01-22 NOTE — Progress Notes (Signed)
PROGRESS NOTE    Shari Mcmahon  [id number]  DOB: 11/01/1926  DOA: 01/21/2016 PCP: Gildardo Cranker, DO Outpatient Specialists: Vascular surgeon: Dr. Al Decant course: 82 y.o. female with PMH of dementia, severe protein calorie malnutrition, dysphagia, recurrent UTIs, type 2 diabetes,  peripheral vascular disease (not a candidate for revascularization) with osteomyelitis of the left calcaneus treated with IV antibiotics via a PICC line (which was apparently placed on or about 01/04/16 according to progress notes, and received 14 days of IV cefepime), chronic pain related to arthritis/gout, HLD, HTN, who was brought to the hospital on 3/23 for evaluation from her SNF secondary to lethargy and changes in her mental status. She was admitted for possible aspiration versus HCAP. Palliative care consulted for goals of care.   Assessment & Plan:    Aspiration pneumonia versus HCAP (healthcare-associated pneumonia)In a patient with dysphagia and Alzheimer's dementia - Chest x-ray showed right lung base infiltrate. Patient with known history of dysphagia. - Likely an aspiration pneumonia in a patient at risk for resistant organisms. As per daughter, patient has had prior episodes of aspiration. - Blood cultures 2: Negative to date. Urine culture-pending. MRSA PCR: Negative. Flu panel PCR: Negative. Urinary streptococcal antigen and sputum cultures have not been sent. - Patient has are ready received Tamiflu as an outpatient, so would hold further Tamiflu for now. - Treating empirically with cefepime and vancomycin to cover resistant organisms. - Speech therapy input appreciated and recommend regular diet, thin liquids and aspiration precautions.  Acute kidney injury Baseline creatinine not clear. Possibly due to poor oral intake. Brief IV fluids and follow BMP in a.m.  Chronic anemia  - Baseline hemoglobin probably in the 8 g per DL range. Hemoglobin 7.4 today. No  reported bleeding. Follow CBCs and transfuse if hemoglobin <7 g per DL.    Acute encephalopathy - Secondary to acute illness complicating underlying dementia. Improving. - CT head without acute findings.  Multiple pressure injuries - Evaluation and management as per wound care team evaluation on 01/21/16.   Essential hypertension - Not currently on antihypertensives, and blood pressure controlled.   Alzheimer's dementia - Continue Aricept, Remeron and Depakote.   Diabetes mellitus, type 2 (HCC) - Insulin sensitive SSI ordered. Controlled.   Peripheral autonomic neuropathy due to diabetes mellitus ([place]) - Unfortunately, has not tolerated Neurontin due to excessive lethargy.   Type II diabetes mellitus with peripheral circulatory disorder (HCC) - Not a candidate for revascularization.   Pressure ulcer/ Ulcer of left heel and midfoot with necrosis of muscle ([place]) - Wound care consultation appreciated.   Dysphagia - Diet per speech therapy recommendations.   Severe protein calorie malnutrition - Secondary to advanced age, dementia and poor oral intake.  Chronic pain -Related to diffuse arthritis, gout, peripheral neuropathy. As per daughter, patient also with pain with minimal movement of any part of her body. This is one reason why she has not participated in therapies in the past.  Adult failure to thrive - Patient used to be fully functional, driving and helping out other people until fall in May 2016. Since then she has not ambulated and mostly bedbound or moves around with the assistance of a wheelchair. Progressive steady decline. Poor overall long-term prognosis given advanced age, frail physical status, dementia, poor nutrition and concern for recurrent aspiration related to mental status, nonhealing and development of new pressure sores due to nonambulatory status. Discussed at length with patient's daughter/healthcare power of attorney who is an Therapist, sports with [place]  healthcare. She is agreeable to palliative care consultation for goals of care.        DVT prophylaxis: Lovenox Code Status: DO NOT RESUSCITATE  Family Communication: Discussed with patient's daughter/healthcare power of attorney Mr. Shari Mcmahon at bedside on 3/24. Disposition Plan: DC to SNF when stable.   Consultants:  Palliative care team   Procedures:  None   Antimicrobials:  Acyclovir 3/231 dose   IV cefepime 3/23 >  Metronidazole 3/221 dose  IV vancomycin 3/23 >  Subjective: Pleasantly confused. Complains of generalized pain. As per daughter, patient is better-more alert, ate well at breakfast this morning.   Objective: Filed Vitals:   01/21/16 1415 01/21/16 2237 01/22/16 0638 01/22/16 1443  BP: 152/46 135/42 142/50 134/53  Pulse: 65 62 64 63  Temp: 98 F (36.7 C) 99.9 F (37.7 C) 97.9 F (36.6 C) 98.2 F (36.8 C)  TempSrc: Oral Axillary Axillary Oral  Resp: 15 15 15 14   Height:      Weight:      SpO2: 100% 100% 99% 100%    Intake/Output Summary (Last 24 hours) at 01/22/16 1457 Last data filed at 01/22/16 1036  Gross per 24 hour  Intake    830 ml  Output      0 ml  Net    830 ml   Filed Weights   01/21/16 0817 01/21/16 1129  Weight: 62.596 kg (138 lb) 62.596 kg (138 lb)    Exam:  General exam: Small built, frail, chronically ill-looking elderly female lying comfortably propped up in bed.  Respiratory system: poor inspiratory effort. Diminished breath sounds in the bases. Rest of lung fields clear to auscultation. No increased work of breathing. Cardiovascular system: S1 & S2 heard, RRR. No JVD, murmurs, gallops, clicks or pedal edema. Gastrointestinal system: Abdomen is nondistended, soft and nontender. Normal bowel sounds heard. Central nervous system: Alert and oriented self. No focal neurological deficits. Extremities: any movement of extremities elicits painful response. No acute findings.    Data Reviewed: Basic Metabolic  Panel:  Recent Labs Lab 01/21/16 0226 01/21/16 0434 01/22/16 0320  NA 142  --  142  K 4.3  --  4.3  CL 106  --  112*  CO2  --   --  24  GLUCOSE 138*  --  62*  BUN 45*  --  36*  CREATININE 1.30* 1.35* 1.51*  CALCIUM  --   --  9.3   Liver Function Tests:  Recent Labs Lab 01/21/16 0430  AST 20  ALT 9*  ALKPHOS 60  BILITOT 0.3  PROT 6.1*  ALBUMIN 2.0*   No results for input(s): LIPASE, AMYLASE in the last 168 hours. No results for input(s): AMMONIA in the last 168 hours. CBC:  Recent Labs Lab 01/21/16 0212 01/21/16 0226 01/22/16 0320  WBC 23.6*  --  20.4*  NEUTROABS 18.9*  --   --   HGB 8.7* 10.9* 7.4*  HCT 26.6* 32.0* 22.5*  MCV 98.2  --  96.6  PLT 364  --  258   Cardiac Enzymes: No results for input(s): CKTOTAL, CKMB, CKMBINDEX, TROPONINI in the last 168 hours. BNP (last 3 results) No results for input(s): PROBNP in the last 8760 hours. CBG:  Recent Labs Lab 01/21/16 1251 01/21/16 1714 01/21/16 2300 01/22/16 0750 01/22/16 1123  GLUCAP 85 127* 112* 83 99    Recent Results (from the past 240 hour(s))  Culture, blood (Routine X 2) w Reflex to ID Panel     Status: None (Preliminary  result)   Collection Time: 01/21/16  4:30 AM  Result Value Ref Range Status   Specimen Description BLOOD BLOOD RIGHT HAND  Final   Special Requests BOTTLES DRAWN AEROBIC AND ANAEROBIC 6CC  Final   Culture   Final    NO GROWTH 1 DAY Performed at [facility]    Report Status PENDING  Incomplete  Culture, blood (Routine X 2) w Reflex to ID Panel     Status: None (Preliminary result)   Collection Time: 01/21/16  4:30 AM  Result Value Ref Range Status   Specimen Description BLOOD BLOOD LEFT HAND  Final   Special Requests BOTTLES DRAWN AEROBIC AND ANAEROBIC [place]  Final   Culture   Final    NO GROWTH 1 DAY Performed at [facility]    Report Status PENDING  Incomplete  Urine culture     Status: None (Preliminary result)   Collection Time: 01/21/16  8:21 AM   Result Value Ref Range Status   Specimen Description URINE, CATHETERIZED  Final   Special Requests NONE  Final   Culture   Final    CULTURE REINCUBATED FOR BETTER GROWTH Performed at [facility]    Report Status PENDING  Incomplete  MRSA PCR Screening     Status: None   Collection Time: 01/21/16 12:45 PM  Result Value Ref Range Status   MRSA by PCR NEGATIVE NEGATIVE Final    Comment:        The GeneXpert MRSA Assay (FDA approved for NASAL specimens only), is one component of a comprehensive MRSA colonization surveillance program. It is not intended to diagnose MRSA infection nor to guide or monitor treatment for MRSA infections.          Studies: Dg Chest 2 View  01/21/2016  CLINICAL DATA:  Fever.  Altered mental status. EXAM: CHEST  2 VIEW COMPARISON:  06/16/2015 FINDINGS: Shallow inspiration with atelectasis in the lung bases. Mild cardiac enlargement with mild vascular congestion. Interstitial changes in the lungs likely represent edema. Possible superimposed infiltration in the right lung base. Calcified and tortuous aorta. No pneumothorax. Left PICC catheter with tip over the low SVC region. Small bilateral pleural effusions. IMPRESSION: Cardiac enlargement with mild pulmonary vascular congestion and interstitial edema. Small pleural effusions. Possible superimposed consolidation in the right lung base. Electronically Signed   By: Lucienne Capers M.D.   On: 01/21/2016 03:09   Dg Pelvis 1-2 Views  01/21/2016  CLINICAL DATA:  Osteonecrosis of both feet. Ulcers in the feet. Decubitus ulcer to the buttocks. EXAM: PELVIS - 1-2 VIEW COMPARISON:  CT abdomen and pelvis 12/25/2014 FINDINGS: Diffuse bone demineralization. Visualization of pelvis is limited due to overlying stool. No definite evidence of any acute displaced fracture. Degenerative changes in the lower lumbar spine and left hip. Previous right hip arthroplasty, incompletely visualized. IMPRESSION: Diffuse bone  demineralization. No acute displaced fractures. Overlying stool limits visualization. Electronically Signed   By: Lucienne Capers M.D.   On: 01/21/2016 04:36   Ct Head Wo Contrast  01/21/2016  CLINICAL DATA:  Initial valuation for acute altered mental status. Fever. EXAM: CT HEAD WITHOUT CONTRAST TECHNIQUE: Contiguous axial images were obtained from the base of the skull through the vertex without intravenous contrast. COMPARISON:  None. FINDINGS: Diffuse prominence of the CSF containing spaces is compatible with generalized cerebral atrophy. Patchy hypodensity within the periventricular and deep white matter both cerebral hemispheres most consistent with chronic small vessel ischemic disease. No acute intracranial hemorrhage. No large vessel  territory infarct. No mass lesion, midline shift, or mass effect. No hydrocephalus. No extra-axial fluid collection para Scalp soft tissues within normal limits. No acute abnormality about the orbits. Paranasal sinuses are clear. No mastoid effusion. Calvarium intact. IMPRESSION: 1. No acute intracranial process. 2. Age-related cerebral atrophy with mild chronic small vessel ischemic disease. Electronically Signed   By: Jeannine Boga M.D.   On: 01/21/2016 06:56   Dg Foot 2 Views Right  01/21/2016  CLINICAL DATA:  Ulcers to the plantar surface and toes of both feet. EXAM: RIGHT FOOT - 2 VIEW COMPARISON:  None. FINDINGS: Diffuse bone demineralization. Suggestion of bone loss along the fourth and fifth metatarsal heads may indicate osteomyelitis. MRI would be more sensitive for evaluation of osteomyelitis if clinically indicated. No acute fracture or dislocation. Soft tissue swelling most prominent over the hindfoot region. Vascular calcifications. IMPRESSION: Diffuse bone demineralization. Suggestion of bone loss over the fourth and fifth metatarsal heads which may indicate changes of osteomyelitis. Electronically Signed   By: Lucienne Capers M.D.   On: 01/21/2016  04:40   Dg Foot Complete Left  01/21/2016  CLINICAL DATA:  Ulcers of the plantar surface and toes of both feet. EXAM: LEFT FOOT - COMPLETE 3+ VIEW COMPARISON:  None. FINDINGS: Diffuse bone demineralization. No acute fracture or dislocation. No definite bone erosion or cortical changes to suggest osteomyelitis. However, MRI would be more sensitive for evaluation of osteomyelitis if clinically indicated. Vascular calcifications are present. Soft tissue swelling over the plantar aspect of the foot most prominent over the hindfoot region. IMPRESSION: Diffuse bone demineralization. No definite radiographic evidence of osteomyelitis. Electronically Signed   By: Lucienne Capers M.D.   On: 01/21/2016 04:39        Scheduled Meds: . ceFEPime (MAXIPIME) IV  1 g Intravenous Q24H  . divalproex  125 mg Oral BID  . donepezil  10 mg Oral QHS  . enoxaparin (LOVENOX) injection  30 mg Subcutaneous Q24H  . febuxostat  40 mg Oral Daily  . feeding supplement (PRO-STAT SUGAR FREE 64)  30 mL Oral TID WC  . insulin aspart  0-5 Units Subcutaneous QHS  . insulin aspart  0-9 Units Subcutaneous TID WC  . lactose free nutrition  237 mL Oral TID BM  . latanoprost  1 drop Both Eyes QHS  . magnesium hydroxide  30 mL Oral Daily  . memantine  28 mg Oral Daily  . mirtazapine  7.5 mg Oral QHS  . multivitamin with minerals  1 tablet Oral Daily  . pantoprazole  40 mg Oral Daily  . senna  1 tablet Oral BID  . sodium chloride flush  10-40 mL Intracatheter Q12H  . vancomycin  750 mg Intravenous Q24H  . vitamin C  500 mg Oral BID   Continuous Infusions: . sodium chloride 100 mL/hr at 01/21/16 2300    Principal Problem:   HCAP (healthcare-associated pneumonia) Active Problems:   Essential hypertension   Dysphagia   Alzheimer's dementia   Diabetes mellitus, type 2 (HCC)   Peripheral autonomic neuropathy due to diabetes mellitus (HCC)   Type II diabetes mellitus with peripheral circulatory disorder (HCC)   Pressure  ulcer   Ulcer of left heel and midfoot with necrosis of muscle ([place])    Time spent: 45 minutes.    Vernell Leep, MD, FACP, FHM. [facility] Pager [phone]  If 7PM-7AM, please contact night-coverage [email or web address] Password [email or web address] 01/22/2016, 2:57 PM    LOS: 1 day

## 2016-01-22 NOTE — Evaluation (Addendum)
Clinical/Bedside Swallow Evaluation Patient Details  Name: Shari Mcmahon MRN: [id number] Date of Birth: [birth year]  Today's Date: 01/22/2016 Time: SLP Start Time (ACUTE ONLY): 1145 SLP Stop Time (ACUTE ONLY): 1230 SLP Time Calculation (min) (ACUTE ONLY): 45 min  Past Medical History:  Past Medical History  Diagnosis Date  . ABDOMINAL PAIN, CHRONIC 04/07/2008  . ARTHRITIS 03/03/2008  . BRADYCARDIA, CHRONIC 12/17/2008  . CARDIAC MURMUR 02/01/2010  . CONSTIPATION, CHRONIC 10/15/2010  . DEGENERATIVE JOINT DISEASE, KNEES, BILATERAL 12/07/2007  . DEPRESSION 06/17/2009  . DIABETES MELLITUS, TYPE II 09/19/2007    DIET CONTROL   . DYSPHAGIA UNSPECIFIED 12/24/2009  . Gastroparesis 12/24/2009  . GENERALIZED OSTEOARTHROSIS INVOLVING HAND 10/03/2007  . GLAUCOMA 09/19/2007  . GOUTY ARTHROPATHY UNSPECIFIED 02/17/2009  . GOUT 09/19/2007  . HYPERLIPIDEMIA 01/27/2010  . HYPERTENSION 09/19/2007  . LUNG NODULE 03/03/2008  . MENOPAUSAL DISORDER 12/17/2008  . PERIPHERAL NEUROPATHY 06/19/2008  . PERSONAL HISTORY MALIGNANT NEOPLASM STOMACH 09/19/2007  . 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 STOMACH CANCER 03/03/2008  . UTI 12/15/2009  . Arthritis of knee, degenerative 10/19/2011   Past Surgical History:  Past Surgical History  Procedure Laterality Date  . Vagotomy    . Partial gastrectomy    . Billroth ii gastorenterostomy    . Colonoscopy     HPI:  pt is an 82 yo female adm to [facility] with AMS, fever - found to have probable right lobe infiltrate.  PMH + for dysphagia = per esophagram 10/15 "tertiary contractions, feline esophagus" appearance, GERD, HTN, gastric cancer s/p partial gastrectomy, gastroparesis.  Swallow evaluation ordered.      Assessment / Plan / Recommendation Clinical Impression  Pt has h/o of known esophageal dysmotility and possible narrowing distally per esophagram in 2015. Today CN exam unremarkable but pt with weak cough - which she states is her norm.  Pt is  immediately clearing her throat and then delayed coughing with thin via straw.  She tolerates thin via cup well without overt indication of airway compromise.  Pt reports displeasure with nectar thick juice - and given h/o esophageal deficits, suspect may worsen her aspiration risk.  Slow mastication noted but was effective without severe residuals.    Suspect her baseline esophageal deficits are exacerbated at this time.  Family and pt deny significant issues with dysphagia prior to admission and deny pt having pnas.     Would recommend to modify diet and use compensation strategies to mitigate risk. Recommend continue soft foods/thin - NO STRAWS - start meals with liquids, use puree to clear solid oral particles, drink water with meals and stay upright for 30-60 minutes after.    Daughter present and both daughter/pt educated to findings/recommendations and were in agreement.  Will follow up x1 to assure tolerance if pt remains here Monday March 27th.  If issues noted before then, please page SLP on w/e duty [id number].  Thanks.     Aspiration Risk  Mild aspiration risk    Diet Recommendation Thin liquid;Regular (family assured they will choose soft foods)   Liquid Administration via: Cup;No straw;Spoon Medication Administration: Whole meds with puree Compensations: Slow rate;Small sips/bites Postural Changes: Seated upright at 90 degrees;Remain upright for at least 30 minutes after po intake    Other  Recommendations Oral Care Recommendations: Oral care BID Other Recommendations: Clarify dietary restrictions   Follow up Recommendations       Frequency and Duration min 1 x/week  1 week       Prognosis  Prognosis for Safe Diet Advancement: Fair      Swallow Study   General Date of Onset: 01/22/16 HPI: pt is an 82 yo female adm to [facility] with AMS, fever - found to have probable right lobe infiltrate.  PMH + for dysphagia = per esophagram 10/15 "tertiary contractions, feline esophagus"  appearance, GERD, HTN, gastric cancer s/p partial gastrectomy, gastroparesis.  Swallow evaluation ordered.    Type of Study: Bedside Swallow Evaluation Diet Prior to this Study: Regular;Thin liquids Temperature Spikes Noted: Yes Respiratory Status: Nasal cannula History of Recent Intubation: No Behavior/Cognition: Alert;Cooperative;Pleasant mood Oral Cavity Assessment: Within Functional Limits (fissures noted in lingua) Oral Care Completed by SLP: No Oral Cavity - Dentition: Edentulous;Other (Comment) (lower dentition present, pt reports upper denture lost) Vision: Functional for self-feeding Self-Feeding Abilities: Needs assist (pt normally feeds herself with left hand but today has weakness) Patient Positioning: Upright in bed Baseline Vocal Quality: Normal Volitional Cough: Weak Volitional Swallow: Able to elicit    Oral/Motor/Sensory Function Overall Oral Motor/Sensory Function: Within functional limits (mild generalized weakness)   Ice Chips Ice chips: Within functional limits Presentation: Spoon   Thin Liquid Thin Liquid: Impaired Presentation: Straw;Cup;Spoon;Self Fed Oral Phase Impairments: Reduced lingual movement/coordination Oral Phase Functional Implications: Other (comment) (suspect possible premature spillage of thin into pharynx) Pharyngeal  Phase Impairments: Cough - Immediate;Cough - Delayed;Throat Clearing - Immediate Other Comments: cough with thin via straw x2/2, no indication of airway compromise with small cup boluses    Nectar Thick Nectar Thick Liquid: Within functional limits Presentation: Cup;Self Fed Other Comments: pt dislikes taste of thickened juice   Honey Thick Honey Thick Liquid: Not tested   Puree Puree: Within functional limits Presentation: Self Fed;Spoon   Solid   GO   Solid: Impaired Presentation: Self Fed Oral Phase Impairments: Reduced lingual movement/coordination;Impaired mastication Oral Phase Functional Implications:  (slow  mastication and transit but no oral residuals) Pharyngeal Phase Impairments: Throat Clearing - Delayed Other Comments: throat clear rectified with bolus of applesauce =         Shari Mcmahon, Shari [facility] SLP [phone]

## 2016-01-22 NOTE — NC FL2 (Signed)
[place] LEVEL OF CARE SCREENING TOOL     IDENTIFICATION  Patient Name: Shari Mcmahon Birthdate: 01/28/1927 Sex: female Admission Date (Current Location): 01/21/2016  [facility] and [place] Number:  Herbalist and Address:  [facility],  [place] [address], [place]      Provider Number: [id number]  Attending Physician Name and Address:  Modena Jansky, MD  Relative Name and Phone Number:       Current Level of Care: Hospital Recommended Level of Care: [place] Prior Approval Number:    Date Approved/Denied:   PASRR Number:    Discharge Plan: SNF    Current Diagnoses: Patient Active Problem List   Diagnosis Date Noted  . HCAP (healthcare-associated pneumonia) 01/21/2016  . Ulcer of left heel and midfoot with necrosis of muscle ([place]) 01/17/2016  . Type II diabetes mellitus with peripheral circulatory disorder ([place]) 01/01/2016  . Pressure ulcer 01/01/2016  . Peripheral autonomic neuropathy due to diabetes mellitus ([place]) 11/15/2015  . GERD without esophagitis 10/30/2015  . Alzheimer's dementia 07/28/2015  . Diabetes mellitus, type 2 ([place]) 07/28/2015  . Fracture of right femur ([place]) 07/28/2015  . Arthritis of knee 05/19/2015  . Anemia 02/25/2015  . Loss of weight 09/17/2014  . Chronic pain syndrome 06/16/2014  . Primary localized osteoarthrosis, lower leg 01/08/2014  . Rotator cuff tear arthropathy of both shoulders 12/09/2013  . CONSTIPATION, CHRONIC 10/15/2010  . Hyperlipidemia 01/27/2010  . Gastroparesis 12/24/2009  . Dysphagia 12/24/2009  . DEPRESSION 06/17/2009  . BRADYCARDIA, CHRONIC 12/17/2008  . PERIPHERAL NEUROPATHY 06/19/2008  . LUNG NODULE 03/03/2008  . GENERALIZED OSTEOARTHROSIS INVOLVING HAND 10/03/2007  . Gout 09/19/2007  . GLAUCOMA 09/19/2007  . Essential hypertension 09/19/2007    Orientation RESPIRATION BLADDER Height & Weight      (disoriented x 4)  Normal Incontinent Weight:  62.596 kg (138 lb) Height:  5\' 6"  (167.6 cm)  BEHAVIORAL SYMPTOMS/MOOD NEUROLOGICAL BOWEL NUTRITION STATUS  Other (Comment) (no behaviors)   Incontinent Diet  AMBULATORY STATUS COMMUNICATION OF NEEDS Skin   Total Care Verbally Other (Comment) (stage 2 pressure ulcer on sacrum; stage 1 ulcer on left heel)                       Personal Care Assistance Level of Assistance  Total care           Functional Limitations Info  Sight, Hearing, Speech Sight Info: Adequate Hearing Info: Adequate Speech Info: Adequate    SPECIAL CARE FACTORS FREQUENCY                       Contractures Contractures Info: Not present    Additional Factors Info  Code Status Code Status Info: DNR             Current Medications (01/22/2016):  This is the current hospital active medication list Current Facility-Administered Medications  Medication Dose Route Frequency Provider Last Rate Last Dose  . 0.9 %  sodium chloride infusion   Intravenous Continuous Venetia Maxon Rama, MD 100 mL/hr at 01/21/16 2300    . acetaminophen (TYLENOL) tablet 650 mg  650 mg Oral Q6H PRN Venetia Maxon Rama, MD   650 mg at 01/22/16 1135   Or  . acetaminophen (TYLENOL) suppository 650 mg  650 mg Rectal Q6H PRN Christina P Rama, MD      . ceFEPIme (MAXIPIME) 1 g in dextrose 5 % 50 mL IVPB  1 g  Intravenous Q24H Donald Prose Runyon, RPH   1 g at 01/21/16 1720  . divalproex (DEPAKOTE SPRINKLE) capsule 125 mg  125 mg Oral BID Venetia Maxon Rama, MD   125 mg at 01/22/16 1034  . donepezil (ARICEPT) tablet 10 mg  10 mg Oral QHS Venetia Maxon Rama, MD   10 mg at 01/21/16 2255  . enoxaparin (LOVENOX) injection 30 mg  30 mg Subcutaneous Q24H Venetia Maxon Rama, MD   30 mg at 01/21/16 2257  . febuxostat (ULORIC) tablet 40 mg  40 mg Oral Daily Venetia Maxon Rama, MD   40 mg at 01/22/16 1034  . feeding supplement (PRO-STAT SUGAR FREE 64) liquid 30 mL  30 mL Oral TID WC Christina P Rama, MD   30 mL at 01/22/16 0800  .  HYDROcodone-acetaminophen (NORCO/VICODIN) 5-325 MG per tablet 1 tablet  1 tablet Oral Q6H PRN Christina P Rama, MD      . insulin aspart (novoLOG) injection 0-5 Units  0-5 Units Subcutaneous QHS Venetia Maxon Rama, MD   0 Units at 01/21/16 2257  . insulin aspart (novoLOG) injection 0-9 Units  0-9 Units Subcutaneous TID WC Venetia Maxon Rama, MD   0 Units at 01/21/16 1255  . lactose free nutrition (BOOST PLUS) liquid 237 mL  237 mL Oral TID BM Venetia Maxon Rama, MD   237 mL at 01/22/16 1036  . latanoprost (XALATAN) 0.005 % ophthalmic solution 1 drop  1 drop Both Eyes QHS Venetia Maxon Rama, MD   1 drop at 01/21/16 2257  . magnesium hydroxide (MILK OF MAGNESIA) suspension 30 mL  30 mL Oral Daily Venetia Maxon Rama, MD   30 mL at 01/22/16 1035  . memantine (NAMENDA XR) 24 hr capsule 28 mg  28 mg Oral Daily Venetia Maxon Rama, MD   28 mg at 01/22/16 1034  . mirtazapine (REMERON) tablet 7.5 mg  7.5 mg Oral QHS Venetia Maxon Rama, MD   7.5 mg at 01/21/16 2254  . multivitamin with minerals tablet 1 tablet  1 tablet Oral Daily Venetia Maxon Rama, MD   1 tablet at 01/22/16 1035  . ondansetron (ZOFRAN) tablet 4 mg  4 mg Oral Q6H PRN Christina P Rama, MD       Or  . ondansetron (ZOFRAN) injection 4 mg  4 mg Intravenous Q6H PRN Christina P Rama, MD      . pantoprazole (PROTONIX) EC tablet 40 mg  40 mg Oral Daily Venetia Maxon Rama, MD   40 mg at 01/22/16 1034  . senna (SENOKOT) tablet 8.6 mg  1 tablet Oral BID Venetia Maxon Rama, MD   8.6 mg at 01/22/16 1035  . sodium chloride flush (NS) 0.9 % injection 10-40 mL  10-40 mL Intracatheter Q12H Christina P Rama, MD   10 mL at 01/22/16 1036  . sodium chloride flush (NS) 0.9 % injection 10-40 mL  10-40 mL Intracatheter PRN Venetia Maxon Rama, MD      . vancomycin (VANCOCIN) IVPB 750 mg/150 ml premix  750 mg Intravenous Q24H Donald Prose Runyon, RPH   750 mg at 01/22/16 0350  . vitamin C (ASCORBIC ACID) tablet 500 mg  500 mg Oral BID Venetia Maxon Rama, MD   500 mg at 01/22/16 1034      Discharge Medications: Please see discharge summary for a list of discharge medications.  Relevant Imaging Results:  Relevant Lab Results:   Additional Information SS # [id number]  Chihiro Frey, Randall An, LCSW

## 2016-01-22 NOTE — Clinical Social Work Note (Signed)
Clinical Social Work Assessment  Patient Details  Name: Shari Mcmahon MRN: [id number] Date of Birth: Sep 11, 1927  Date of referral:  01/22/16               Reason for consult:  Facility Placement, Discharge Planning                Permission sought to share information with:  Facility Art therapist granted to share information::  Yes, Verbal Permission Granted  Name::        Agency::     Relationship::     Contact Information:     Housing/Transportation Living arrangements for the past 2 months:  [place] of Information:  Facility, Adult Children Patient Interpreter Needed:  None Criminal Activity/Legal Involvement Pertinent to Current Situation/Hospitalization:  No - Comment as needed Significant Relationships:  Adult Children Lives with:  Facility Resident Do you feel safe going back to the place where you live?  Yes Need for family participation in patient care:  Yes (Comment)  Care giving concerns: No concerns reported at this time.   Social Worker assessment / plan:  Pt hospitalized on 01/21/16 with HCAP . Pt is a LTC resident from [facility]. Pt is disoriented x 4 and unable to participate in d/c planning. CSW met with pt's daughter at bedside. Daughter would like pt to return to SNF at d/c. SNF contacted and d/c plan confirmed. CSW will continue to follow to assist with d/c planning to SNF.  Employment status:  Retired Nurse, adult, Medicaid In [place] PT Recommendations:  Not assessed at this time Information / Referral to community resources:     Patient/Family's Response to care: Daughter would like pt to return to SNF at d/c.  Patient/Family's Understanding of and Emotional Response to Diagnosis, Current Treatment, and Prognosis:  Daughter is aware of pt's medical status. " I our family could care for my mother at home we would. That just isn't possible. We would rather have at home than in  placement." Pt has a caring and supportive family that visits SNF often to ensure pt gets the care she needs.  Emotional Assessment Appearance:  Appears stated age Attitude/Demeanor/Rapport:  Other (cooperative) Affect (typically observed):  Calm, Appropriate Orientation:   (disoriented x 4) Alcohol / Substance use:  Not Applicable Psych involvement (Current and /or in the community):  No (Comment)  Discharge Needs  Concerns to be addressed:  Discharge Planning Concerns Readmission within the last 30 days:  No Current discharge risk:  None Barriers to Discharge:  No Barriers Identified   Luretha Rued, [place] 01/22/2016, 2:27 PM

## 2016-01-22 NOTE — Progress Notes (Signed)
Initial Nutrition Assessment  DOCUMENTATION CODES:   Not applicable  INTERVENTION:  -Ensure Enlive po BID, each supplement provides 350 kcal and 20 grams of protein -RD continue to monitor  NUTRITION DIAGNOSIS:   Inadequate oral intake related to lethargy/confusion as evidenced by per patient/family report.  GOAL:   Patient will meet greater than or equal to 90% of their needs  MONITOR:   PO intake, I & O's, Labs, Supplement acceptance  REASON FOR ASSESSMENT:   Consult Assessment of nutrition requirement/status  ASSESSMENT:   Shari Mcmahon is an 82 y.o. female with PMH of dementia, severe protein calorie malnutrition, dysphasia, recurrent UTIs, type 2 diabetes and peripheral vascular disease (not a candidate for revascularization) with osteomyelitis of the left calcaneus treated with IV antibiotics via a PICC line (which was apparently placed on or about 01/04/16 according to progress notes, and received 14 days of IV cefepime), who was brought to the hospital for evaluation from her SNF secondary to lethargy and changes in her mental status.  [name] was unable to provide any hx. Daughters at bedside state pt has good appetite. Endorse wt loss but per chart pt's wt stable x6 months. PO Intake in chart 50-100%  Nutrition-Focused physical exam completed. Findings are mild fat depletion, moderate muscle depletion, and no edema.   Pt does suffer from osteomyelitis, poor wound healing r/t t2dm. Will provide Ensure during stay to try to assist.   Labs and medications reviewed.  Diet Order:  Diet heart healthy/carb modified Room service appropriate?: Yes; Fluid consistency:: Thin  Skin:  Wound (see comment) (Stg I To Heel, Stg 2 to Sacrum)  Last BM:  PTA  Height:   Ht Readings from Last 1 Encounters:  01/21/16 5\' 6"  (1.676 m)    Weight:   Wt Readings from Last 1 Encounters:  01/21/16 138 lb (62.596 kg)    Ideal Body Weight:  59.09 kg  BMI:  Body mass index  is 22.28 kg/(m^2).  Estimated Nutritional Needs:   Kcal:  1500-1800 calories  Protein:  60-70 grams  Fluid:  >/= 1.5L  EDUCATION NEEDS:   No education needs identified at this time  [name]. [name], MS, RD LDN After Hours/Weekend Pager [phone]

## 2016-01-23 ENCOUNTER — Inpatient Hospital Stay (HOSPITAL_COMMUNITY): Payer: Medicare Other

## 2016-01-23 DIAGNOSIS — N179 Acute kidney failure, unspecified: Secondary | ICD-10-CM | POA: Insufficient documentation

## 2016-01-23 DIAGNOSIS — Z7189 Other specified counseling: Secondary | ICD-10-CM | POA: Insufficient documentation

## 2016-01-23 DIAGNOSIS — J690 Pneumonitis due to inhalation of food and vomit: Secondary | ICD-10-CM | POA: Insufficient documentation

## 2016-01-23 LAB — BASIC METABOLIC PANEL
ANION GAP: 8 (ref 5–15)
BUN: 40 mg/dL — ABNORMAL HIGH (ref 6–20)
CHLORIDE: 112 mmol/L — AB (ref 101–111)
CO2: 22 mmol/L (ref 22–32)
Calcium: 9.4 mg/dL (ref 8.9–10.3)
Creatinine, Ser: 1.76 mg/dL — ABNORMAL HIGH (ref 0.44–1.00)
GFR, EST AFRICAN AMERICAN: 29 mL/min — AB (ref >60)
GFR, EST NON AFRICAN AMERICAN: 25 mL/min — AB (ref >60)
Glucose, Bld: 91 mg/dL (ref 65–99)
POTASSIUM: 4.3 mmol/L (ref 3.5–5.1)
Sodium: 142 mmol/L (ref 135–145)

## 2016-01-23 LAB — CBC
HEMATOCRIT: 22.7 % — AB (ref 36.0–46.0)
HEMOGLOBIN: 7.5 g/dL — AB (ref 12.0–15.0)
MCH: 31.5 pg (ref 26.0–34.0)
MCHC: 33 g/dL (ref 30.0–36.0)
MCV: 95.4 fL (ref 78.0–100.0)
Platelets: 266 10*3/uL (ref 150–400)
RBC: 2.38 MIL/uL — ABNORMAL LOW (ref 3.87–5.11)
RDW: 18.9 % — AB (ref 11.5–15.5)
WBC: 13.6 10*3/uL — AB (ref 4.0–10.5)

## 2016-01-23 LAB — GLUCOSE, CAPILLARY
GLUCOSE-CAPILLARY: 85 mg/dL (ref 65–99)
GLUCOSE-CAPILLARY: 95 mg/dL (ref 65–99)
Glucose-Capillary: 94 mg/dL (ref 65–99)
Glucose-Capillary: 97 mg/dL (ref 65–99)

## 2016-01-23 LAB — TYPE AND SCREEN
ABO/RH(D): A POS
Antibody Screen: NEGATIVE

## 2016-01-23 LAB — ABO/RH: ABO/RH(D): A POS

## 2016-01-23 LAB — URINE CULTURE: Culture: 20000

## 2016-01-23 MED ORDER — SODIUM CHLORIDE 0.9 % IV SOLN
INTRAVENOUS | Status: DC
  Administered 2016-01-23: 15:00:00 via INTRAVENOUS
  Administered 2016-01-24: 100 mL/h via INTRAVENOUS

## 2016-01-23 NOTE — Progress Notes (Signed)
CSW received call from RN, that patient and daughter would like to see social worker regarding discharge plans.   CSW met with pt at bedsidede, and gave permission to speak with patient duaghter Shari Mcmahon who was also at bedside. Per patient she would like to discharge to [facility] instead of going to any other nursing home. Shari Mcmahon requesting to speak with RN CM regarding home health services, and private duty nursing.   Per Shari Mcmahon, patient has had several bed sores and she is not happy with the care she has received in any of the nursing homes. 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  Patient states she would prefer to live with any of her children instead of returning.   Patient shares that Shari Mcmahon is her HCPOA, however did not realize she would be making all the decisions.   Patient is alert and oriented x4.   Per chart review and discussion with patient, there is a lot of disagreement between children and the youngest daughter, Shari Mcmahon who wants to take patient home.  CSW encouraged patient and patient daughter to talk with rn cm and to consider having extra help at home to ensure best care and prevent caregiver burn out.   Patient continues to state she would prefer to discharge to [facility] home instead of any nursing home.   CSW left message for rn cm.   [name], LCSW  Clinical Social Work   [phone].

## 2016-01-23 NOTE — Progress Notes (Signed)
PROGRESS NOTE    Shari Mcmahon  [id number]  DOB: 1927/10/08  DOA: 01/21/2016 PCP: Gildardo Cranker, DO Outpatient Specialists: Vascular surgeon: Dr. Al Decant course: 82 y.o. female with PMH of dementia, severe protein calorie malnutrition, dysphagia, recurrent UTIs, type 2 diabetes,  peripheral vascular disease (not a candidate for revascularization) with osteomyelitis of the left calcaneus treated with IV antibiotics via a PICC line (which was apparently placed on or about 01/04/16 according to progress notes, and received 14 days of IV cefepime), chronic pain related to arthritis/gout, HLD, HTN, who was brought to the hospital on 3/23 for evaluation from her SNF secondary to lethargy and changes in her mental status. She was admitted for possible aspiration versus HCAP. Palliative care consulted for goals of care.   Assessment & Plan:    Aspiration pneumonia versus HCAP (healthcare-associated pneumonia)In a patient with dysphagia and Alzheimer's dementia - Chest x-ray showed right lung base infiltrate. Patient with known history of dysphagia. - Likely an aspiration pneumonia in a patient at risk for resistant organisms. As per daughter, patient has had prior episodes of aspiration. - Blood cultures 2: Negative to date. Urine culture-VRE (only 20,000 colonies-likely a contaminant or a colonizer). MRSA PCR: Negative. Flu panel PCR: Negative. Urinary streptococcal antigen and sputum cultures have not been sent. - Patient has are ready received Tamiflu as an outpatient, so would hold further Tamiflu for now. - Treating empirically with cefepime and vancomycin to cover resistant organisms. DC vancomycin. Continue cefepime for additional day then transitioned to oral quinolones to complete 7 days treatment. - Speech therapy input appreciated and recommend regular diet, thin liquids and aspiration precautions.  Acute kidney injury Baseline creatinine not clear.  Possibly due to poor oral intake. Despite IV fluids, creatinine has progressively increased. Renal ultrasound without hydronephrosis. Increased IV fluids. Follow BMP in a.m.  Chronic anemia  - Baseline hemoglobin probably in the 8 g per DL range. Hemoglobin holding steady in the mid 7 g per DL range. No reported bleeding. Follow CBCs and transfuse if hemoglobin <7 g per DL.    Acute encephalopathy - Secondary to acute illness complicating underlying dementia. Improving and as per healthcare power of attorney, almost back to baseline. - CT head without acute findings.  Multiple pressure injuries - Evaluation and management as per wound care team evaluation on 01/21/16.   Essential hypertension - Not currently on antihypertensives, and blood pressure controlled.   Alzheimer's dementia - Continue Aricept, Remeron and Depakote.   Diabetes mellitus, type 2 (HCC) - Insulin sensitive SSI ordered. Controlled.   Peripheral autonomic neuropathy due to diabetes mellitus ([place]) - Unfortunately, has not tolerated Neurontin due to excessive lethargy.   Type II diabetes mellitus with peripheral circulatory disorder (HCC) - Not a candidate for revascularization.   Pressure ulcer/ Ulcer of left heel and midfoot with necrosis of muscle ([place]) - Wound care consultation appreciated.   Dysphagia - Diet per speech therapy recommendations.   Severe protein calorie malnutrition - Secondary to advanced age, dementia and poor oral intake.  Chronic pain -Related to diffuse arthritis, gout, peripheral neuropathy. As per daughter, patient also with pain with minimal movement of any part of her body. This is one reason why she has not participated in therapies in the past.  Adult failure to thrive - Patient used to be fully functional, driving and helping out other people until fall in May 2016. Since then she has not ambulated and mostly bedbound or moves around with the assistance  of a wheelchair.  Progressive steady decline. Poor overall long-term prognosis given advanced age, frail physical status, dementia, poor nutrition and concern for recurrent aspiration related to mental status, nonhealing and development of new pressure sores due to nonambulatory status. Discussed at length with patient's daughter/healthcare power of attorney who is an Therapist, sports with [place] [employer]. She is agreeable to palliative care consultation for goals of care.    - Palliative care input appreciated. Apparent disagreement in family. DO NOT RESUSCITATE confirmed. Youngest daughter wishes to take patient home with his other 3 siblings would like her to go to SNF with palliative care and eventually hospice.     DVT prophylaxis: Lovenox Code Status: DO NOT RESUSCITATE  Family Communication: Discussed with patient's daughter/healthcare power of attorney Mr. Guadalupe Dawn at bedside on 3/25, prior to palliative care meeting. Disposition Plan: DC to home with youngest daughter.   Consultants:  Palliative care team   Procedures:  None   Antimicrobials:  Acyclovir 3/231 dose   IV cefepime 3/23 >  Metronidazole 3/221 dose  IV vancomycin 3/23 >  Subjective: Pleasantly confused. Complains of generalized pain in right hand with associated swelling.   Objective: Filed Vitals:   01/22/16 2244 01/23/16 0623 01/23/16 1113 01/23/16 1400  BP: 121/46 149/55 158/52 160/60  Pulse: 64 70 73 66  Temp: 98 F (36.7 C) 98.1 F (36.7 C) 98.1 F (36.7 C) 98.7 F (37.1 C)  TempSrc: Oral Oral Oral Oral  Resp: 16 16 18 20   Height:      Weight:      SpO2: 100% 98% 100% 100%    Intake/Output Summary (Last 24 hours) at 01/23/16 1555 Last data filed at 01/23/16 1400  Gross per 24 hour  Intake   1688 ml  Output      0 ml  Net   1688 ml   Filed Weights   01/21/16 0817 01/21/16 1129  Weight: 62.596 kg (138 lb) 62.596 kg (138 lb)    Exam:  General exam: Small built, frail, chronically ill-looking elderly  female lying comfortably propped up in bed.  Respiratory system: poor inspiratory effort. Diminished breath sounds in the bases. Rest of lung fields clear to auscultation. No increased work of breathing. Cardiovascular system: S1 & S2 heard, RRR. No JVD, murmurs, gallops, clicks or pedal edema. Gastrointestinal system: Abdomen is nondistended, soft and nontender. Normal bowel sounds heard. Central nervous system: Alert and oriented self and place. No focal neurological deficits. Extremities: any movement of extremities elicits painful response-less so this morning. No acute findings. Chronic arthritic changes of both hands and fingers without acute findings.   Data Reviewed: Basic Metabolic Panel:  Recent Labs Lab 01/21/16 0226 01/21/16 0434 01/22/16 0320 01/23/16 0500  NA 142  --  142 142  K 4.3  --  4.3 4.3  CL 106  --  112* 112*  CO2  --   --  24 22  GLUCOSE 138*  --  62* 91  BUN 45*  --  36* 40*  CREATININE 1.30* 1.35* 1.51* 1.76*  CALCIUM  --   --  9.3 9.4   Liver Function Tests:  Recent Labs Lab 01/21/16 0430  AST 20  ALT 9*  ALKPHOS 60  BILITOT 0.3  PROT 6.1*  ALBUMIN 2.0*   No results for input(s): LIPASE, AMYLASE in the last 168 hours. No results for input(s): AMMONIA in the last 168 hours. CBC:  Recent Labs Lab 01/21/16 0212 01/21/16 0226 01/22/16 0320 01/23/16 0500  WBC 23.6*  --  20.4* 13.6*  NEUTROABS 18.9*  --   --   --   HGB 8.7* 10.9* 7.4* 7.5*  HCT 26.6* 32.0* 22.5* 22.7*  MCV 98.2  --  96.6 95.4  PLT 364  --  258 266   Cardiac Enzymes: No results for input(s): CKTOTAL, CKMB, CKMBINDEX, TROPONINI in the last 168 hours. BNP (last 3 results) No results for input(s): PROBNP in the last 8760 hours. CBG:  Recent Labs Lab 01/22/16 1123 01/22/16 1709 01/22/16 2241 01/23/16 0757 01/23/16 1220  GLUCAP 99 112* 102* 94 97    Recent Results (from the past 240 hour(s))  Culture, blood (Routine X 2) w Reflex to ID Panel     Status: None  (Preliminary result)   Collection Time: 01/21/16  4:30 AM  Result Value Ref Range Status   Specimen Description BLOOD BLOOD RIGHT HAND  Final   Special Requests BOTTLES DRAWN AEROBIC AND ANAEROBIC 6CC  Final   Culture   Final    NO GROWTH 2 DAYS Performed at [facility]    Report Status PENDING  Incomplete  Culture, blood (Routine X 2) w Reflex to ID Panel     Status: None (Preliminary result)   Collection Time: 01/21/16  4:30 AM  Result Value Ref Range Status   Specimen Description BLOOD BLOOD LEFT HAND  Final   Special Requests BOTTLES DRAWN AEROBIC AND ANAEROBIC [place]  Final   Culture   Final    NO GROWTH 2 DAYS Performed at [facility]    Report Status PENDING  Incomplete  Urine culture     Status: None   Collection Time: 01/21/16  8:21 AM  Result Value Ref Range Status   Specimen Description URINE, CATHETERIZED  Final   Special Requests NONE  Final   Culture   Final    20,000 COLONIES/mL VANCOMYCIN RESISTANT ENTEROCOCCUS Performed at [facility]    Report Status 01/23/2016 FINAL  Final   Organism ID, Bacteria VANCOMYCIN RESISTANT ENTEROCOCCUS  Final      Susceptibility   Vancomycin resistant enterococcus - MIC*    AMPICILLIN >=32 RESISTANT Resistant     LEVOFLOXACIN >=8 RESISTANT Resistant     NITROFURANTOIN 256 RESISTANT Resistant     VANCOMYCIN >=32 RESISTANT Resistant     LINEZOLID 2 SENSITIVE Sensitive     * 20,000 COLONIES/mL VANCOMYCIN RESISTANT ENTEROCOCCUS  MRSA PCR Screening     Status: None   Collection Time: 01/21/16 12:45 PM  Result Value Ref Range Status   MRSA by PCR NEGATIVE NEGATIVE Final    Comment:        The GeneXpert MRSA Assay (FDA approved for NASAL specimens only), is one component of a comprehensive MRSA colonization surveillance program. It is not intended to diagnose MRSA infection nor to guide or monitor treatment for MRSA infections.          Studies: US Renal  01/23/2016  CLINICAL DATA:  82 year old  female with a history of acute kidney injury and diabetes EXAM: RENAL / URINARY TRACT ULTRASOUND COMPLETE COMPARISON:  CT 12/25/2014 FINDINGS: Right Kidney: Length: 8.1 cm. Echogenicity of the right kidney similar to the adjacent liver. No hydronephrosis. Flow confirmed in the hilum. Left Kidney: Length: 7.8 cm. Echogenicity of the left kidney relatively symmetric to the right. No spleen confidently visualized. No hydronephrosis.  Flow appears present at the left kidney hilum. Bladder: Appears normal for degree of bladder distention. IMPRESSION: Sonographic survey demonstrates no hydronephrosis. Signed, Dulcy Fanny. Earleen Newport, DO [facility] Electronically Signed   By: Corrie Mckusick D.O.   On: 01/23/2016 10:26        Scheduled Meds: . ceFEPime (MAXIPIME) IV  1 g Intravenous Q24H  . divalproex  125 mg Oral BID  . donepezil  10 mg Oral QHS  . enoxaparin (LOVENOX) injection  30 mg Subcutaneous Q24H  . febuxostat  40 mg Oral Daily  . feeding supplement (ENSURE ENLIVE)  237 mL Oral BID BM  . feeding supplement (PRO-STAT SUGAR FREE 64)  30 mL Oral TID WC  . lactose free nutrition  237 mL Oral TID BM  . latanoprost  1 drop Both Eyes QHS  . magnesium hydroxide  30 mL Oral Daily  . memantine  28 mg Oral Daily  . mirtazapine  7.5 mg Oral QHS  . multivitamin with minerals  1 tablet Oral Daily  . pantoprazole  40 mg Oral Daily  . senna  1 tablet Oral BID  . sodium chloride flush  10-40 mL Intracatheter Q12H  . vancomycin  750 mg Intravenous Q24H  . vitamin C  500 mg Oral BID   Continuous Infusions: . sodium chloride 100 mL/hr at 01/23/16 1523    Principal Problem:   HCAP (healthcare-associated pneumonia) Active Problems:   Essential hypertension   Dysphagia   Alzheimer's dementia   Diabetes mellitus, type 2 (HCC)   Peripheral autonomic neuropathy due to diabetes mellitus (HCC)   Type II diabetes mellitus with peripheral circulatory  disorder (HCC)   Pressure ulcer   Ulcer of left heel and midfoot with necrosis of muscle ([place])   Encounter for palliative care   Goals of care, counseling/discussion    Time spent: 25 minutes.    Vernell Leep, MD, FACP, FHM. [facility] Pager [phone]  If 7PM-7AM, please contact night-coverage [email or web address] Password [email or web address] 01/23/2016, 3:55 PM    LOS: 2 days

## 2016-01-23 NOTE — Progress Notes (Signed)
Patient's urine culture positive for VRE.  Message sent to Dr. Algis Liming.  Patient placed on Contact Precautions.  Patient's daughter, [name], informed of infection and need for precautions and given handout on VRE.  Zandra Abts [facility]  01/23/2016

## 2016-01-23 NOTE — Evaluation (Signed)
Physical Therapy Evaluation Patient Details Name: Shari Mcmahon MRN: [id number] DOB: 03-31-1927 Today's Date: 01/23/2016   History of Present Illness  Shari Mcmahon is an 82 y.o. female adm  with lethargy, AMS; PMH of dementia,  malnutrition, dysphasia, recurrent UTIs, type 2 diabetes, peripheral vascular disease (not a candidate for revascularization) with osteomyelitis of the left calcaneus PC meeting scheduled for today  Clinical Impression  Patient evaluated by Physical Therapy with no further acute PT needs identified. All education has been completed and the patient has no further questions.  See below for any follow-up Physical Therapy or equipment needs. PT is signing off. Thank you for this referral.  Pt is extremely painful in all extremities and resistant to imposed movement (d/t pain), she is unable to perform  Cervical ROM with head supported and keeps neck in R lateral flexion; she report she requires assist for all ADLs and gets OOB occasionally with hoyer lift; she has reports she was able to assist with feeding but recently  unable to even do that for herself; Pt is pleasant and attempts to be cooperative but greatly limited d/t pain; Pt is not at candidate for rehab at this time, will sign off;      Follow Up Recommendations No PT follow up    Equipment Recommendations  None recommended by PT    Recommendations for Other Services       Precautions / Restrictions        Mobility  Bed Mobility               General bed mobility comments: unable to test d/t pain--pt total assist at SNF  Transfers                 General transfer comment: NT/unable  Ambulation/Gait             General Gait Details: non amb prior to adm  Stairs            Wheelchair Mobility    Modified Rankin (Stroke Patients Only)       Balance                                             Pertinent Vitals/Pain Pain Assessment:  Faces Faces Pain Scale: Hurts worst Pain Location: all extremities Pain Intervention(s): Monitored during session    Home Living Family/patient expects to be discharged to:: Skilled nursing facility                 Additional Comments: no family present; pt states she is from [facility]; she reports she gets OOB occasionally with  hoyer lift, she requires assist with all basic mobility and ADLs, recently has even had to have assist with feeding    Prior Function Level of Independence: Needs assistance   Gait / Transfers Assistance Needed: unable/total assist  ADL's / Homemaking Assistance Needed: total assist        Hand Dominance        Extremity/Trunk Assessment   Upper Extremity Assessment: RUE deficits/detail;LUE deficits/detail RUE Deficits / Details: pt is resistant to imposed movement bil UEs d/t pain, she is unable flex digits to grip anything d/t pain RUE: Unable to fully assess due to pain       Lower Extremity Assessment: RLE deficits/detail;LLE deficits/detail RLE Deficits / Details: resistant to imposed movement; able to tolerate  a few degrees ROM on RLE--knee flexion and hip ABD/ADDuction and then asks PT to stop d/t pain       Communication   Communication: No difficulties  Cognition Arousal/Alertness: Awake/alert Behavior During Therapy: WFL for tasks assessed/performed Overall Cognitive Status: Within Functional Limits for tasks assessed                      General Comments      Exercises        Assessment/Plan    PT Assessment Patent does not need any further PT services  PT Diagnosis Acute pain   PT Problem List    PT Treatment Interventions     PT Goals (Current goals can be found in the Care Plan section) Acute Rehab PT Goals Patient Stated Goal: less pain PT Goal Formulation: All assessment and education complete, DC therapy    Frequency     Barriers to discharge        Co-evaluation                End of Session   Activity Tolerance: Patient limited by pain Patient left: in bed;with call bell/phone within reach;with bed alarm set           Time: 1143-1156 PT Time Calculation (min) (ACUTE ONLY): 13 min   Charges:   PT Evaluation $PT Eval Low Complexity: 1 Procedure     PT G Codes:        Shari Mcmahon 28-Jan-2016, 12:07 PM

## 2016-01-23 NOTE — Consult Note (Signed)
Consultation Note Date: 01/23/2016   Patient Name: Shari Mcmahon  DOB: [birth year]  MRN: [id number]  Age / Sex: 82 y.o., female  PCP: Shari Cranker, DO Referring Physician: Modena Jansky, MD  Reason for Consultation: Establishing goals of care    Clinical Assessment/Narrative:   82 y.o. female with PMH of dementia, severe protein calorie malnutrition, dysphagia, recurrent UTIs, type 2 diabetes,  peripheral vascular disease (not a candidate for revascularization) with osteomyelitis of the left calcaneus treated with IV antibiotics via a PICC line (which was apparently placed on or about 01/04/16 according to progress notes, and received 14 days of IV cefepime), chronic pain related to arthritis/gout, HLD, HTN, who was brought to the hospital on 3/23 for evaluation from her SNF secondary to lethargy and changes in her mental status. She was admitted for possible aspiration versus HCAP. She is on empiric broad spectrum antibiotics. Speech therapy input is for regular diet, thin liquids and aspiration precautions.   Patient used to be fully functional, driving and helping out other people until fall in May 2016. Since then she has not ambulated and mostly bedbound or moves around with the assistance of a wheelchair. Progressive steady decline. Poor overall long-term prognosis given advanced age, frail physical status, dementia, poor nutrition and concern for recurrent aspiration related to mental status, nonhealing and development of new pressure sores due to nonambulatory status.   Palliative care consulted for goals of care discussions.   Patient is an elderly lady resting in bed, she has finished eating fish and onion rings. 2 daughters present at bedside are diligent in following aspiration precautions. Patient is currently not coughing or choking.   Introduced palliative care, brief life review performed, discussed  about the patient's serious multiple illnesses such as her recurrent risk for aspiration PNAs, her heel ulcers, dementia etc. Goals of care attempted to be elicited.      Contacts/Participants in Discussion: Primary Decision Maker:     Relationship to Patient   HCPOA: yes   daughter Shari Mcmahon [id number] [place]: DNR DNI Continue current mode of care.   Palliative consult at SNF on D/C. Have discussed with family that patient might require full hospice services based on her disease trajectory. She has several life limiting conditions complicating her care. They are aware.  There is conflict within the family members: patient's daughter HCPOA agent Shari Mcmahon and patient's 2 sons want her to go back to [facility] with palliative and then hospice services. Patient's youngest daughter however wants to take the patient home, she is accepting of either home health or hospice at home. She states that she can arrange for 24/7 care.  I have encouraged the family to continue disposition discussions, keeping the patient's best interests in mind. Will request case management/ SW follow up on Monday 01-25-16.   Code Status/Advance Care Planning: DNR    Code Status Orders        Start     Ordered   01/21/16 1152  Do not attempt resuscitation (DNR)   Continuous    Question Answer Comment  In the event of cardiac or respiratory ARREST Do not call a "code blue"   In the event of cardiac or respiratory ARREST Do not perform Intubation, CPR, defibrillation or ACLS   In the event of cardiac or respiratory ARREST Use medication by any route, position, wound care, and other measures to relive pain and suffering. May use oxygen, suction and manual treatment of  airway obstruction as needed for comfort.      01/21/16 1151    Code Status History    Date Active Date Inactive Code Status Order ID Comments User Context   01/21/2016  9:21 AM 01/21/2016 11:51 AM DNR [id number]  Jola Schmidt, MD ED    Advance Directive Documentation        Most Recent Value   Type of Advance Directive  Out of facility DNR (pink MOST or yellow form)   Pre-existing out of facility DNR order (yellow form or pink MOST form)  Physician notified to receive inpatient order   "MOST" Form in Place?        Other Directives:Other  Symptom Management:     As above  Palliative Prophylaxis:   Bowel Regimen  Psycho-social/Spiritual:  Support System: Shari Mcmahon Desire for further Chaplaincy support:no Additional Recommendations: Caregiving  Support/Resources  Prognosis: Unable to determine  Discharge Planning:  Recommend SNF with palliative services on D/C.   Chief Complaint/ Primary Diagnoses: Present on Admission:  . (Resolved) Sepsis ([place]) . HCAP (healthcare-associated pneumonia) . Alzheimer's dementia . Essential hypertension . Peripheral autonomic neuropathy due to diabetes mellitus ([place]) . Pressure ulcer . Type II diabetes mellitus with peripheral circulatory disorder (HCC) . Ulcer of left heel and midfoot with necrosis of muscle ([place])  I have reviewed the medical record, interviewed the patient and family, and examined the patient. The following aspects are pertinent.  Past Medical History  Diagnosis Date  . ABDOMINAL PAIN, CHRONIC 04/07/2008  . ARTHRITIS 03/03/2008  . BRADYCARDIA, CHRONIC 12/17/2008  . CARDIAC MURMUR 02/01/2010  . CONSTIPATION, CHRONIC 10/15/2010  . DEGENERATIVE JOINT DISEASE, KNEES, BILATERAL 12/07/2007  . DEPRESSION 06/17/2009  . DIABETES MELLITUS, TYPE II 09/19/2007    DIET CONTROL   . DYSPHAGIA UNSPECIFIED 12/24/2009  . Gastroparesis 12/24/2009  . GENERALIZED OSTEOARTHROSIS INVOLVING HAND 10/03/2007  . GLAUCOMA 09/19/2007  . GOUTY ARTHROPATHY UNSPECIFIED 02/17/2009  . GOUT 09/19/2007  . HYPERLIPIDEMIA 01/27/2010  . HYPERTENSION 09/19/2007  . LUNG NODULE 03/03/2008  . MENOPAUSAL DISORDER 12/17/2008  . PERIPHERAL NEUROPATHY 06/19/2008  . PERSONAL HISTORY MALIGNANT  NEOPLASM STOMACH 09/19/2007  . 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 STOMACH CANCER 03/03/2008  . UTI 12/15/2009  . Arthritis of knee, degenerative 10/19/2011   Social History   Social History  . Marital Status: Widowed    Spouse Name: N/A  . Number of Children: 6  . Years of Education: 8   Occupational History  . retired    Social History Main Topics  . Smoking status: Former Smoker    Types: Cigarettes  . Smokeless tobacco: Never Used     Comment: use to smoke a pack of cigaretts a day for 25 yrs. but stopped 10 yrs. ago.   . Alcohol Use: No  . Drug Use: No  . Sexual Activity: Not Asked   Other Topics Concern  . None   Social History Narrative   [place] [facility] [place]    Family History  Problem Relation Age of Onset  . Uterine cancer Mother   . Stroke Father     Hemorrhagic  . Cancer Sister     breast cancer and one sister with uterine cancer and one sister with ovarian cancerr  . Colon cancer Maternal Grandmother   . Colon cancer Paternal Grandfather   . Diabetes Other   . Stomach cancer Neg Hx   . Prostate cancer Brother    Scheduled Meds: . ceFEPime (MAXIPIME) IV  1  g Intravenous Q24H  . divalproex  125 mg Oral BID  . donepezil  10 mg Oral QHS  . enoxaparin (LOVENOX) injection  30 mg Subcutaneous Q24H  . febuxostat  40 mg Oral Daily  . feeding supplement (ENSURE ENLIVE)  237 mL Oral BID BM  . feeding supplement (PRO-STAT SUGAR FREE 64)  30 mL Oral TID WC  . lactose free nutrition  237 mL Oral TID BM  . latanoprost  1 drop Both Eyes QHS  . magnesium hydroxide  30 mL Oral Daily  . memantine  28 mg Oral Daily  . mirtazapine  7.5 mg Oral QHS  . multivitamin with minerals  1 tablet Oral Daily  . pantoprazole  40 mg Oral Daily  . senna  1 tablet Oral BID  . sodium chloride flush  10-40 mL Intracatheter Q12H  . vancomycin  750 mg Intravenous Q24H  . vitamin C  500 mg Oral BID   Continuous Infusions: . sodium chloride     PRN  Meds:.acetaminophen **OR** acetaminophen, HYDROcodone-acetaminophen, ondansetron **OR** ondansetron (ZOFRAN) IV, sodium chloride flush Medications Prior to Admission:  Prior to Admission medications   Medication Sig Start Date End Date Taking? Authorizing Provider  Amino Acids-Protein Hydrolys (FEEDING SUPPLEMENT, PRO-STAT SUGAR FREE 64,) LIQD Take 30 mLs by mouth 3 (three) times daily with meals. 11/15/15  Yes Gerlene Fee, NP  ceFEPIme 2 g in dextrose 5 % 50 mL Inject 2 g into the vein every 12 (twelve) hours. For 14 days   Yes Historical Provider, MD  colchicine 0.6 MG tablet Take 1 tablet (0.6 mg total) by mouth daily as needed. 10/29/15  Yes Gerlene Fee, NP  divalproex (DEPAKOTE SPRINKLES) 125 MG capsule Take 1 capsule (125 mg total) by mouth 2 (two) times daily. 06/17/15  Yes Biagio Borg, MD  donepezil (ARICEPT) 10 MG tablet Take 10 mg by mouth at bedtime.   Yes Historical Provider, MD  febuxostat (ULORIC) 40 MG tablet Take 40 mg by mouth daily.   Yes Historical Provider, MD  feeding supplement (BOOST HIGH PROTEIN) LIQD Take 1 Container by mouth 3 (three) times daily between meals.   Yes Historical Provider, MD  HYDROcodone-acetaminophen (NORCO/VICODIN) 5-325 MG tablet Take one tablet by mouth every 8 hours for pain. Not to exceed 3000mg  of APAP form all sources/24hr 09/04/15  Yes Tiffany L Reed, DO  insulin aspart (NOVOLOG) 100 UNIT/ML injection Inject 3 Units into the skin 3 (three) times daily before meals. For cbg >=150   Yes Historical Provider, MD  latanoprost (XALATAN) 0.005 % ophthalmic solution Place 1 drop into both eyes at bedtime.  07/28/14  Yes Historical Provider, MD  magnesium hydroxide (MILK OF MAGNESIA) 400 MG/5ML suspension Take 30 mLs by mouth daily.   Yes Historical Provider, MD  memantine (NAMENDA XR) 28 MG CP24 24 hr capsule Take 28 mg by mouth daily.   Yes Historical Provider, MD  mirtazapine (REMERON) 7.5 MG tablet Take 7.5 mg by mouth at bedtime.   Yes Historical  Provider, MD  Multiple Vitamin tablet Take 1 tablet by mouth daily.   Yes Historical Provider, MD  omeprazole (PRILOSEC) 40 MG capsule Take 1 capsule (40 mg total) by mouth daily. 08/15/14  Yes Milus Banister, MD  oseltamivir (TAMIFLU) 75 MG capsule Take 75 mg by mouth daily. For 14 days   Yes Historical Provider, MD  senna (SENOKOT) 8.6 MG tablet Take 1 tablet by mouth 2 (two) times daily. Reported on 10/29/2015   Yes Historical  Provider, MD  vitamin C (ASCORBIC ACID) 500 MG tablet Take 500 mg by mouth 2 (two) times daily.   Yes Historical Provider, MD   Allergies  Allergen Reactions  . Voltaren [Diclofenac Sodium] Other (See Comments)    Stomach irritation  . Dulcolax [Bisacodyl]     Unknown   . Duloxetine Hcl Other (See Comments)    Made patient not want to eat or drink  . Penicillins Swelling    Unknown   . Timolol     REACTION: bradycardia worse    Review of Systems Complains of pain in her bottom  Physical Exam Elderly appearing thin lady NAD S1 S2 Clear Abdomen soft No edema Answers few yes/no type questions appropriately.   Vital Signs: BP 149/55 mmHg  Pulse 70  Temp(Src) 98.1 F (36.7 C) (Oral)  Resp 16  Ht 5\' 6"  (1.676 m)  Wt 62.596 kg (138 lb)  BMI 22.28 kg/m2  SpO2 98%  SpO2: SpO2: 98 % O2 Device:SpO2: 98 % O2 Flow Rate: .O2 Flow Rate (L/min): 2 L/min  IO: Intake/output summary:  Intake/Output Summary (Last 24 hours) at 01/23/16 1003 Last data filed at 01/23/16 0956  Gross per 24 hour  Intake   1413 ml  Output      1 ml  Net   1412 ml    LBM: Last BM Date:  (unknown ) Baseline Weight: Weight: 62.596 kg (138 lb) (per daughter) Most recent weight: Weight: 62.596 kg (138 lb)      Palliative Assessment/Data:  Flowsheet Rows        Most Recent Value   Intake Tab    Referral Department  Hospitalist   Unit at Time of Referral  Med/Surg Unit   Palliative Care Primary Diagnosis  Neurology   Palliative Care Type  New Palliative care   Reason for  referral  Clarify Goals of Care   Clinical Assessment    Palliative Performance Scale Score  30%   Pain Max last 24 hours  4   Pain Min Last 24 hours  3   Dyspnea Max Last 24 Hours  4   Dyspnea Min Last 24 hours  3   Psychosocial & Spiritual Assessment    Palliative Care Outcomes    Patient/Family meeting held?  Yes   Who was at the meeting?  2 sons 2 daughters and patient.    Palliative Care follow-up planned  Yes, Facility      Additional Data Reviewed:  CBC:    Component Value Date/Time   WBC 13.6* 01/23/2016 0500   WBC 6.2 06/17/2008 0904   HGB 7.5* 01/23/2016 0500   HGB 12.1 06/17/2008 0904   HCT 22.7* 01/23/2016 0500   HCT 36.3 06/17/2008 0904   PLT 266 01/23/2016 0500   PLT 126 Occas Small Plt Clump* 06/17/2008 0904   MCV 95.4 01/23/2016 0500   MCV 102.7* 06/17/2008 0904   NEUTROABS 18.9* 01/21/2016 0212   NEUTROABS 3.4 06/17/2008 0904   LYMPHSABS 2.7 01/21/2016 0212   LYMPHSABS 1.6 06/17/2008 0904   MONOABS 1.8* 01/21/2016 0212   MONOABS 1.0* 06/17/2008 0904   EOSABS 0.2 01/21/2016 0212   EOSABS 0.1 06/17/2008 0904   BASOSABS 0.0 01/21/2016 0212   BASOSABS 0.1 06/17/2008 0904   Comprehensive Metabolic Panel:    Component Value Date/Time   NA 142 01/23/2016 0500   K 4.3 01/23/2016 0500   CL 112* 01/23/2016 0500   CO2 22 01/23/2016 0500   BUN 40* 01/23/2016 0500   CREATININE 1.76*  01/23/2016 0500   GLUCOSE 91 01/23/2016 0500   CALCIUM 9.4 01/23/2016 0500   AST 20 01/21/2016 0430   ALT 9* 01/21/2016 0430   ALKPHOS 60 01/21/2016 0430   BILITOT 0.3 01/21/2016 0430   PROT 6.1* 01/21/2016 0430   ALBUMIN 2.0* 01/21/2016 0430     Time In: 1430 Time Out: 1530 Time Total: 60 min  Greater than 50%  of this time was spent counseling and coordinating care related to the above assessment and plan.  Signed by: Loistine Chance, MD [place], MD  01/23/2016, 3:30 PM  Please contact Palliative Medicine Team phone at [phone] for questions and  concerns.

## 2016-01-24 LAB — BASIC METABOLIC PANEL
Anion gap: 6 (ref 5–15)
BUN: 49 mg/dL — ABNORMAL HIGH (ref 6–20)
CHLORIDE: 119 mmol/L — AB (ref 101–111)
CO2: 22 mmol/L (ref 22–32)
CREATININE: 1.88 mg/dL — AB (ref 0.44–1.00)
Calcium: 9.7 mg/dL (ref 8.9–10.3)
GFR, EST AFRICAN AMERICAN: 27 mL/min — AB (ref >60)
GFR, EST NON AFRICAN AMERICAN: 23 mL/min — AB (ref >60)
Glucose, Bld: 82 mg/dL (ref 65–99)
POTASSIUM: 4.4 mmol/L (ref 3.5–5.1)
SODIUM: 147 mmol/L — AB (ref 135–145)

## 2016-01-24 LAB — CBC
HEMATOCRIT: 23 % — AB (ref 36.0–46.0)
Hemoglobin: 7.6 g/dL — ABNORMAL LOW (ref 12.0–15.0)
MCH: 31.7 pg (ref 26.0–34.0)
MCHC: 33 g/dL (ref 30.0–36.0)
MCV: 95.8 fL (ref 78.0–100.0)
PLATELETS: 258 10*3/uL (ref 150–400)
RBC: 2.4 MIL/uL — AB (ref 3.87–5.11)
RDW: 18.6 % — ABNORMAL HIGH (ref 11.5–15.5)
WBC: 12.9 10*3/uL — AB (ref 4.0–10.5)

## 2016-01-24 LAB — GLUCOSE, CAPILLARY
GLUCOSE-CAPILLARY: 80 mg/dL (ref 65–99)
GLUCOSE-CAPILLARY: 92 mg/dL (ref 65–99)
GLUCOSE-CAPILLARY: 127 mg/dL — AB (ref 65–99)
Glucose-Capillary: 91 mg/dL (ref 65–99)

## 2016-01-24 MED ORDER — DEXTROSE 5 % IV SOLN
INTRAVENOUS | Status: DC
  Administered 2016-01-24: 100 mL via INTRAVENOUS
  Administered 2016-01-24: 11:00:00 via INTRAVENOUS

## 2016-01-24 NOTE — Progress Notes (Signed)
Patients daughter [name] called requesting update on patient. [name] explains she is patients power of attorney. Nurse went into room, asked patient is nurse could give daughter, [name], health information. Patient agreed for nurse to give [facility] information. [name] asking about how patient was today. Nurse explained patient was sleepy today but was doing okay. [name] asked patients Hgb, nurse told her it was 7.6. [name] asked about patients WBCs, nurse explained WBCs were 12.9 today. [name] explains she will be here tomorrow morning.

## 2016-01-24 NOTE — Care Management Important Message (Signed)
Important Message  Patient Details  Name: Shari Mcmahon MRN: [id number] Date of Birth: 06-13-1927   Medicare Important Message Given:  Yes    Erenest Rasher, RN 01/24/2016, 3:56 PM

## 2016-01-24 NOTE — Progress Notes (Signed)
Pharmacy Antibiotic Note  Shari Mcmahon is a 82 y.o. female admitted on 01/21/2016 with pneumonia (HCAP). Pt has a h/o recurrent UTIs and recently received Cefepime 2g q12h 3/9 >> 3/22 (14 day course) per [facility] MAR for osteomyelitis of left heel.  Vancomycin and cefepime started on admission.   Vancomycin d/ced on 3/25.  She's currently in cefepime day #4.  Dr. Janelle Floor note on 3/25 indicated plan for possible transition to oral fluoroquinolone on 3/26 to complete 7 days of treatment.  - afeb, wbc down 12.9, scr trending up 1.88 (crcl~20)   Plan: -  Continue Cefepime 1g IV q24h  -  If to change to levaquin, recommend 500 mg q48h regimen (due to rising scr) - monitor renal function closely  ________________________   Height: 5\' 6"  (167.6 cm) Weight: 138 lb (62.596 kg) IBW/kg (Calculated) : 59.3  Temp (24hrs), Avg:98 F (36.7 C), Min:97.4 F (36.3 C), Max:98.7 F (37.1 C)   Recent Labs Lab 01/21/16 0212 01/21/16 0226 01/21/16 0228 01/21/16 0434 01/21/16 0450 01/22/16 0320 01/23/16 0500 01/24/16 0855  WBC 23.6*  --   --   --   --  20.4* 13.6* 12.9*  CREATININE  --  1.30*  --  1.35*  --  1.51* 1.76* 1.88*  LATICACIDVEN  --   --  1.97  --  1.19  --   --   --     Estimated Creatinine Clearance: 19.7 mL/min (by C-G formula based on Cr of 1.88).    Allergies  Allergen Reactions  . Voltaren [Diclofenac Sodium] Other (See Comments)    Stomach irritation  . Dulcolax [Bisacodyl]     Unknown   . Duloxetine Hcl Other (See Comments)    Made patient not want to eat or drink  . Penicillins Swelling    Unknown   . Timolol     REACTION: bradycardia worse    Antimicrobials this admission: 3/9 cefepime >>  3/23 flagyl x 1 3/23 vancomycin >> 3/25 (TRH feels VRE does NOT need to be treated) 3/23 acyclovir >> 3/23  Dose adjustments this admission: -  Microbiology results: 3/23 BCx x2: ngtd 3/23 UCx  (cath): 20K VRE (S= zyvox) FINAL 3/23 S. pneumo UAg: IP 3/23 MRSA nasal  swab: neg Hx sensitive Proteus in urine   Thank you for allowing pharmacy to be a part of this patient's care.  Lynelle Doctor 01/24/2016 11:50 AM

## 2016-01-24 NOTE — Care Management (Signed)
Required documentation for hospital bed.    Problems with Aspiration  Patient spends ______100______ % of the time in bed. ______dysphagia________________________________ causes frequent                          (% of time in bed)                                           (Problems with aspiration / dysphagia)  _______choking__________. Torso required to be elevated at least ____45_________ to prevent aspiration. Bed wedges do not provide  (gagging / choking)                                  (30 or more)  adequate elevation to resolve issues with aspiration. ______Choking_____________ requires frequent and immediate changes in body                                                                                                    (Gagging / choking)  position which cannot be achieved with a normal bed.  Jonnie Finner RN CCM Case Mgmt phone [phone]

## 2016-01-24 NOTE — Care Management Note (Signed)
Case Management Note  Patient Details  Name: Shari Mcmahon MRN: [id number] Date of Birth: 1927/01/07  Subjective/Objective:   PVD, CAP, dysphagia, dementia                 Action/Plan: Discharge Planning:  NCM spoke to pt and [place], [place] # [phone] at bedside. Dtr states she plans to take pt home and is requesting [facility] and hospital bed/hoyer lift. States she is available to care for pt 24/7. States pt did apply for Medicaid when she was home before but due to income did not qualify. Provided private duty agency list to pt. Offered choice/HH list provided. States she had [place] in the past. Contacted attending with request for DME and [place] for home. [place] liaison, Butch Penny with new referral. Contacted [place] liaison, Tiffany for hospital bed and hoyer lift. Provided [facility] agency with dtr's contact info. States she plans to hire private duty aide to assist her on days when she has to go out.    Expected Discharge Date:              Expected Discharge Plan:  [place]  In-House Referral:  Clinical Social Work  Discharge planning Services  CM Consult  Post Acute Care Choice:  Home Health Choice offered to:  Adult Children  DME Arranged:  Hospital bed, Other see comment DME Agency:  [place]:  RN, PT, Nurse's Aide, Social Work [employer] Agency:  [place]  Status of Service:  Completed, signed off  Medicare Important Message Given:    Date Medicare IM Given:    Medicare IM give by:    Date Additional Medicare IM Given:    Additional Medicare Important Message give by:     If discussed at [place] of Stay Meetings, dates discussed:    Additional Comments:  Erenest Rasher, RN 01/24/2016, 3:44 PM

## 2016-01-24 NOTE — Progress Notes (Signed)
PROGRESS NOTE    Shari Mcmahon  [id number]  DOB: [birth year]  DOA: 01/21/2016 PCP: Gildardo Cranker, DO Outpatient Specialists: Vascular surgeon: Dr. Al Decant course: 82 y.o. female with PMH of dementia, severe protein calorie malnutrition, dysphagia, recurrent UTIs, type 2 diabetes,  peripheral vascular disease (not a candidate for revascularization) with osteomyelitis of the left calcaneus treated with IV antibiotics via a PICC line (which was apparently placed on or about 01/04/16 according to progress notes, and received 14 days of IV cefepime), chronic pain related to arthritis/gout, HLD, HTN, who was brought to the hospital on 3/23 for evaluation from her SNF secondary to lethargy and changes in her mental status. She was admitted for possible aspiration versus HCAP. Palliative care consulted for goals of care.   Assessment & Plan:    Aspiration pneumonia versus HCAP (healthcare-associated pneumonia)In a patient with dysphagia and Alzheimer's dementia - Chest x-ray showed right lung base infiltrate. Patient with known history of dysphagia. - Likely an aspiration pneumonia in a patient at risk for resistant organisms. As per daughter, patient has had prior episodes of aspiration. - Blood cultures 2: Negative to date. Urine culture-VRE (only 20,000 colonies-likely a contaminant or a colonizer). MRSA PCR: Negative. Flu panel PCR: Negative. Urinary streptococcal antigen and sputum cultures have not been sent. - Patient has are ready received Tamiflu as an outpatient, so would hold further Tamiflu for now. - Treating empirically with cefepime and vancomycin to cover resistant organisms. DC vancomycin. Continue cefepime for additional day then transitioned to oral quinolones to complete 7 days treatment. - Speech therapy input appreciated and recommend regular diet, thin liquids and aspiration precautions.  Acute kidney injury Baseline creatinine not clear.  Possibly due to poor oral intake. Despite IV fluids, creatinine has progressively increased. Renal ultrasound without hydronephrosis. Creatinine continues to gradually increase. Now at 1.8 and hyponatremia. Change IV fluids from normal saline to D5. Will discuss with nephrology.  Chronic anemia  - Baseline hemoglobin probably in the 8 g per DL range. Hemoglobin holding steady in the mid 7 g per DL range. No reported bleeding. Follow CBCs and transfuse if hemoglobin <7 g per DL.    Acute encephalopathy - Secondary to acute illness complicating underlying dementia. Improving and as per healthcare power of attorney, almost back to baseline. - CT head without acute findings. As per youngest daughter's report today, mental status is not as good as yesterday. However as per nursing staff, no significant change compared to yesterday.  Multiple pressure injuries - Evaluation and management as per wound care team evaluation on 01/21/16.   Essential hypertension - Not currently on antihypertensives, and blood pressure controlled.   Alzheimer's dementia - Continue Aricept, Remeron and Depakote.   Diabetes mellitus, type 2 (HCC) - Insulin sensitive SSI ordered. Controlled.   Peripheral autonomic neuropathy due to diabetes mellitus ([place]) - Unfortunately, has not tolerated Neurontin due to excessive lethargy.   Type II diabetes mellitus with peripheral circulatory disorder (HCC) - Not a candidate for revascularization.   Pressure ulcer/ Ulcer of left heel and midfoot with necrosis of muscle ([place]) - Wound care consultation appreciated.   Dysphagia - Diet per speech therapy recommendations.   Severe protein calorie malnutrition - Secondary to advanced age, dementia and poor oral intake.  Chronic pain -Related to diffuse arthritis, gout, peripheral neuropathy. As per daughter, patient also with pain with minimal movement of any part of her body. This is one reason why she has not  participated in therapies  in the past.  Adult failure to thrive - Patient used to be fully functional, driving and helping out other people until fall in May 2016. Since then she has not ambulated and mostly bedbound or moves around with the assistance of a wheelchair. Progressive steady decline. Poor overall long-term prognosis given advanced age, frail physical status, dementia, poor nutrition and concern for recurrent aspiration related to mental status, nonhealing and development of new pressure sores due to nonambulatory status. Discussed at length with patient's daughter/healthcare power of attorney who is an Therapist, sports with [place] [employer]. She is agreeable to palliative care consultation for goals of care.    - Palliative care input appreciated. Apparent disagreement in family. DO NOT RESUSCITATE confirmed. Youngest daughter wishes to take patient home with his other 3 siblings would like her to go to SNF with palliative care and eventually hospice.     DVT prophylaxis: Lovenox Code Status: DO NOT RESUSCITATE  Family Communication: Discussed with patient's youngest daughter at bedside on 3/26. Disposition Plan: DC to home with youngest daughter when medically stable.   Consultants:  Palliative care team   Procedures:  None   Antimicrobials:  Acyclovir 3/231 dose   IV cefepime 3/23 >  Metronidazole 3/221 dose  IV vancomycin 3/23 >  Subjective: As per nursing staff, ate breakfast this morning. Subsequently seen by me and appeared slightly somnolent but easily arousable and orientation about the same as yesterday.  Objective: Filed Vitals:   01/23/16 1113 01/23/16 1400 01/23/16 2220 01/24/16 0608  BP: 158/52 160/60 147/53 147/55  Pulse: 73 66 63 72  Temp: 98.1 F (36.7 C) 98.7 F (37.1 C) 97.8 F (36.6 C) 97.4 F (36.3 C)  TempSrc: Oral Oral Oral Oral  Resp: 18 20 14 18   Height:      Weight:      SpO2: 100% 100% 100% 100%    Intake/Output Summary (Last 24 hours)  at 01/24/16 1415 Last data filed at 01/24/16 0929  Gross per 24 hour  Intake   1380 ml  Output      0 ml  Net   1380 ml   Filed Weights   01/21/16 0817 01/21/16 1129  Weight: 62.596 kg (138 lb) 62.596 kg (138 lb)    Exam:  General exam: Small built, frail, chronically ill-looking elderly female lying comfortably propped up in bed.  Respiratory system: poor inspiratory effort. Diminished breath sounds in the bases. Rest of lung fields clear to auscultation. No increased work of breathing. Cardiovascular system: S1 & S2 heard, RRR. No JVD, murmurs, gallops, clicks or pedal edema. Gastrointestinal system: Abdomen is nondistended, soft and nontender. Normal bowel sounds heard. Central nervous system: Alert and oriented self and place. No focal neurological deficits. Extremities: No acute findings. Chronic arthritic changes of both hands and fingers without acute findings.   Data Reviewed: Basic Metabolic Panel:  Recent Labs Lab 01/21/16 0226 01/21/16 0434 01/22/16 0320 01/23/16 0500 01/24/16 0855  NA 142  --  142 142 147*  K 4.3  --  4.3 4.3 4.4  CL 106  --  112* 112* 119*  CO2  --   --  24 22 22   GLUCOSE 138*  --  62* 91 82  BUN 45*  --  36* 40* 49*  CREATININE 1.30* 1.35* 1.51* 1.76* 1.88*  CALCIUM  --   --  9.3 9.4 9.7   Liver Function Tests:  Recent Labs Lab 01/21/16 0430  AST 20  ALT 9*  ALKPHOS 60  BILITOT 0.3  PROT 6.1*  ALBUMIN 2.0*   No results for input(s): LIPASE, AMYLASE in the last 168 hours. No results for input(s): AMMONIA in the last 168 hours. CBC:  Recent Labs Lab 01/21/16 0212 01/21/16 0226 01/22/16 0320 01/23/16 0500 01/24/16 0855  WBC 23.6*  --  20.4* 13.6* 12.9*  NEUTROABS 18.9*  --   --   --   --   HGB 8.7* 10.9* 7.4* 7.5* 7.6*  HCT 26.6* 32.0* 22.5* 22.7* 23.0*  MCV 98.2  --  96.6 95.4 95.8  PLT 364  --  258 266 258   Cardiac Enzymes: No results for input(s): CKTOTAL, CKMB, CKMBINDEX, TROPONINI in the last 168 hours. BNP  (last 3 results) No results for input(s): PROBNP in the last 8760 hours. CBG:  Recent Labs Lab 01/23/16 1220 01/23/16 1703 01/23/16 2207 01/24/16 0744 01/24/16 1139  GLUCAP 97 85 95 80 91    Recent Results (from the past 240 hour(s))  Culture, blood (Routine X 2) w Reflex to ID Panel     Status: None (Preliminary result)   Collection Time: 01/21/16  4:30 AM  Result Value Ref Range Status   Specimen Description BLOOD BLOOD RIGHT HAND  Final   Special Requests BOTTLES DRAWN AEROBIC AND ANAEROBIC 6CC  Final   Culture   Final    NO GROWTH 2 DAYS Performed at [facility]    Report Status PENDING  Incomplete  Culture, blood (Routine X 2) w Reflex to ID Panel     Status: None (Preliminary result)   Collection Time: 01/21/16  4:30 AM  Result Value Ref Range Status   Specimen Description BLOOD BLOOD LEFT HAND  Final   Special Requests BOTTLES DRAWN AEROBIC AND ANAEROBIC [place]  Final   Culture   Final    NO GROWTH 2 DAYS Performed at [facility]    Report Status PENDING  Incomplete  Urine culture     Status: None   Collection Time: 01/21/16  8:21 AM  Result Value Ref Range Status   Specimen Description URINE, CATHETERIZED  Final   Special Requests NONE  Final   Culture   Final    20,000 COLONIES/mL VANCOMYCIN RESISTANT ENTEROCOCCUS Performed at [facility]    Report Status 01/23/2016 FINAL  Final   Organism ID, Bacteria VANCOMYCIN RESISTANT ENTEROCOCCUS  Final      Susceptibility   Vancomycin resistant enterococcus - MIC*    AMPICILLIN >=32 RESISTANT Resistant     LEVOFLOXACIN >=8 RESISTANT Resistant     NITROFURANTOIN 256 RESISTANT Resistant     VANCOMYCIN >=32 RESISTANT Resistant     LINEZOLID 2 SENSITIVE Sensitive     * 20,000 COLONIES/mL VANCOMYCIN RESISTANT ENTEROCOCCUS  MRSA PCR Screening     Status: None   Collection Time: 01/21/16 12:45 PM  Result Value Ref Range Status   MRSA by PCR NEGATIVE NEGATIVE Final    Comment:        The  GeneXpert MRSA Assay (FDA approved for NASAL specimens only), is one component of a comprehensive MRSA colonization surveillance program. It is not intended to diagnose MRSA infection nor to guide or monitor treatment for MRSA infections.          Studies: US Renal  01/23/2016  CLINICAL DATA:  82 year old female with a history of acute kidney injury and diabetes EXAM: RENAL / URINARY TRACT ULTRASOUND COMPLETE COMPARISON:  CT 12/25/2014 FINDINGS: Right Kidney: Length: 8.1 cm. Echogenicity of the right kidney similar to the adjacent liver. No  hydronephrosis. Flow confirmed in the hilum. Left Kidney: Length: 7.8 cm. Echogenicity of the left kidney relatively symmetric to the right. No spleen confidently visualized. No hydronephrosis.  Flow appears present at the left kidney hilum. Bladder: Appears normal for degree of bladder distention. IMPRESSION: Sonographic survey demonstrates no hydronephrosis. Signed, Dulcy Fanny. Earleen Newport, DO [facility] Electronically Signed   By: Corrie Mckusick D.O.   On: 01/23/2016 10:26        Scheduled Meds: . ceFEPime (MAXIPIME) IV  1 g Intravenous Q24H  . divalproex  125 mg Oral BID  . donepezil  10 mg Oral QHS  . enoxaparin (LOVENOX) injection  30 mg Subcutaneous Q24H  . febuxostat  40 mg Oral Daily  . feeding supplement (ENSURE ENLIVE)  237 mL Oral BID BM  . feeding supplement (PRO-STAT SUGAR FREE 64)  30 mL Oral TID WC  . lactose free nutrition  237 mL Oral TID BM  . latanoprost  1 drop Both Eyes QHS  . magnesium hydroxide  30 mL Oral Daily  . memantine  28 mg Oral Daily  . mirtazapine  7.5 mg Oral QHS  . multivitamin with minerals  1 tablet Oral Daily  . pantoprazole  40 mg Oral Daily  . senna  1 tablet Oral BID  . sodium chloride flush  10-40 mL Intracatheter Q12H  . vitamin C  500 mg Oral BID   Continuous Infusions: . dextrose 100 mL/hr at 01/24/16 1112    Principal Problem:   HCAP  (healthcare-associated pneumonia) Active Problems:   Essential hypertension   Dysphagia   Alzheimer's dementia   Diabetes mellitus, type 2 (HCC)   Peripheral autonomic neuropathy due to diabetes mellitus (HCC)   Type II diabetes mellitus with peripheral circulatory disorder (HCC)   Pressure ulcer   Ulcer of left heel and midfoot with necrosis of muscle ([place])   Encounter for palliative care   Goals of care, counseling/discussion   Aspiration pneumonia ([place])   AKI (acute kidney injury) ([place])    Time spent: 25 minutes.    Vernell Leep, MD, FACP, FHM. [facility] Pager [phone]  If 7PM-7AM, please contact night-coverage [email or web address] Password [email or web address] 01/24/2016, 2:15 PM    LOS: 3 days

## 2016-01-25 DIAGNOSIS — E870 Hyperosmolality and hypernatremia: Secondary | ICD-10-CM | POA: Insufficient documentation

## 2016-01-25 LAB — BASIC METABOLIC PANEL
Anion gap: 6 (ref 5–15)
BUN: 56 mg/dL — AB (ref 6–20)
CHLORIDE: 114 mmol/L — AB (ref 101–111)
CO2: 23 mmol/L (ref 22–32)
Calcium: 9.5 mg/dL (ref 8.9–10.3)
Creatinine, Ser: 1.86 mg/dL — ABNORMAL HIGH (ref 0.44–1.00)
GFR calc Af Amer: 27 mL/min — ABNORMAL LOW (ref >60)
GFR calc non Af Amer: 23 mL/min — ABNORMAL LOW (ref >60)
GLUCOSE: 116 mg/dL — AB (ref 65–99)
POTASSIUM: 4.5 mmol/L (ref 3.5–5.1)
SODIUM: 143 mmol/L (ref 135–145)

## 2016-01-25 LAB — GLUCOSE, CAPILLARY
GLUCOSE-CAPILLARY: 98 mg/dL (ref 65–99)
GLUCOSE-CAPILLARY: 110 mg/dL — AB (ref 65–99)
GLUCOSE-CAPILLARY: 101 mg/dL — AB (ref 65–99)

## 2016-01-25 MED ORDER — HYDROCODONE-ACETAMINOPHEN 5-325 MG PO TABS
0.5000 | ORAL_TABLET | Freq: Four times a day (QID) | ORAL | Status: DC | PRN
Start: 2016-01-25 — End: 2016-01-28
  Administered 2016-01-27 – 2016-01-28 (×2): 0.5 via ORAL
  Filled 2016-01-25 (×2): qty 1

## 2016-01-25 MED ORDER — DEXTROSE 5 % IV SOLN
INTRAVENOUS | Status: DC
  Administered 2016-01-25: 09:00:00 via INTRAVENOUS

## 2016-01-25 NOTE — Progress Notes (Signed)
Speech Language Pathology Treatment: Dysphagia  Patient Details Name: Shari Mcmahon MRN: [id number] DOB: Aug 27, 1927 Today's Date: 01/25/2016 Time: 8022-3361 SLP Time Calculation (min) (ACUTE ONLY): 9 min  Assessment / Plan / Recommendation Clinical Impression  Pt seen to assess po tolerance over the weekend and assure instrumental evaluation is not indicated.  Note palliative referral note indicating family is following aspiration precautions closely.  Today, SLP assisted pt to eat portion of blueberry muffin and cranberry juice via cup.  No indications of airway compromise - minimal oral residuals of muffin cleared with liquid swallow.  Intake over the weekend listed as 40-85%.    Due to multiple risk factors lending to aspiration/aspiraiton pneumonia risk including esophageal dysphagia, h/o partial gastrectomy, gastroparesis-  recommend to continue use of strict compensation strategies to mitigate risk.   Daughter Shari Mcmahon educated during evaluation on Friday 3/24- and pt appears to be tolerating diet well = therefore no follow up indicated. Thanks for allowing me to help with this pt's care plan.   HPI HPI: pt is an 82 yo female adm to [facility] [address] with AMS, fever - found to have probable right lobe infiltrate.  PMH + for dysphagia = per esophagram 10/15 "tertiary contractions, feline esophagus" appearance, GERD, HTN, gastric cancer s/p partial gastrectomy, gastroparesis.  Swallow evaluation ordered.         SLP Plan  All goals met     Recommendations  Diet recommendations: Regular (order softer foods due to pt's lack of upper dentition and dysphagia) Liquids provided via: Cup;No straw Compensations: Minimize environmental distractions;Slow rate;Small sips/bites (use applesauce/puree to help clear oral particulates) Postural Changes and/or Swallow Maneuvers: Out of bed for meals             Plan: All goals met     [place], [place], [place] [facility]  SLP [phone]

## 2016-01-25 NOTE — Progress Notes (Signed)
   01/25/16 1500  Clinical Encounter Type  Visited With Patient not available  [place] attempted to visit with pt; pt unavailable (sleeping) and no family present; given some of the palliative care notes, [place] will attempt to visit and follow-up with family. Gwynn Burly 3:41 PM

## 2016-01-25 NOTE — Progress Notes (Signed)
PROGRESS NOTE    Shari Mcmahon  [id number]  DOB: 1927/08/11  DOA: 01/21/2016 PCP: Gildardo Cranker, DO Outpatient Specialists: Vascular surgeon: Dr. Al Decant course: 82 y.o. female with PMH of dementia, severe protein calorie malnutrition, dysphagia, recurrent UTIs, type 2 diabetes,  peripheral vascular disease (not a candidate for revascularization) with osteomyelitis of the left calcaneus treated with IV antibiotics via a PICC line (which was apparently placed on or about 01/04/16 according to progress notes, and received 14 days of IV cefepime), chronic pain related to arthritis/gout, HLD, HTN, who was brought to the hospital on 3/23 for evaluation from her SNF secondary to lethargy and changes in her mental status. She was admitted for possible aspiration versus HCAP. Palliative care consulted for goals of care. Acute kidney injury-nephrology consulted. Capacity issues-psychiatric consulted.   Assessment & Plan:    Aspiration pneumonia versus HCAP (healthcare-associated pneumonia)In a patient with dysphagia and Alzheimer's dementia - Chest x-ray showed right lung base infiltrate. Patient with known history of dysphagia. - Likely an aspiration pneumonia in a patient at risk for resistant organisms. As per daughter, patient has had prior episodes of aspiration. - Blood cultures 2: Negative to date. Urine culture-VRE (only 20,000 colonies-likely a contaminant or a colonizer). MRSA PCR: Negative. Flu panel PCR: Negative. Urinary streptococcal antigen and sputum cultures have not been sent. - Patient has are ready received Tamiflu as an outpatient, so would hold further Tamiflu for now. - Treating empirically with cefepime and vancomycin to cover resistant organisms. DC'ed vancomycin. Continue cefepime for additional day then transitioned to oral quinolones to complete 7 days treatment. - Speech therapy input appreciated and recommend regular diet, thin liquids and  aspiration precautions. - Patient is at high risk for recurrent aspiration related to dementia and mental status changes.  Acute kidney injury - Baseline creatinine not clear. Possibly due to poor oral intake.  - Despite IV fluids, creatinine had progressively increased to 1.8 on 3/26. Renal ultrasound without hydronephrosis.  - IV fluids were changed from normal saline to D5 on 3/26 secondary to hypernatremia. Creatinine has plateaued in the 1.8 range. - Nephrology consulted. Input appreciated. Checking further labs. They do not recommend aggressive therapy given her multiple, irreversible comorbidities and poor functional status. Also indicate poor overall prognosis-agree.  Chronic anemia  - Baseline hemoglobin probably in the 8 g per DL range. Hemoglobin holding steady in the mid 7 g per DL range. No reported bleeding. Follow CBCs and transfuse if hemoglobin <7 g per DL.    Acute encephalopathy complicating underlying chronic mental status changes related to dementia. - Secondary to acute illness complicating underlying dementia.  - Mental status waxing and waning which may be related to acute kidney injury, pneumonia and medications (Depakote) complicating underlying dementia. - CT head without acute findings.  - Treat underlying cause. DC Depakote as per psychiatry recommendations and follow.  Multiple pressure injuries - Evaluation and management as per wound care team evaluation on 01/21/16.   Essential hypertension - Not currently on antihypertensives, and blood pressure controlled.   Alzheimer's dementia - Continue Aricept, Remeron. As per psychiatry, no clear indication for Depakote and hence discontinued.   Diabetes mellitus, type 2 (HCC) - Insulin sensitive SSI ordered. Controlled.   Peripheral autonomic neuropathy due to diabetes mellitus ([place]) - Unfortunately, has not tolerated Neurontin due to excessive lethargy.   Type II diabetes mellitus with peripheral  circulatory disorder (HCC) - Not a candidate for revascularization.   Pressure ulcer/ Ulcer of left  heel and midfoot with necrosis of muscle ([place]) - Wound care consultation appreciated.   Dysphagia - Diet per speech therapy recommendations.   Severe protein calorie malnutrition - Secondary to advanced age, dementia and poor oral intake.  Chronic pain -Related to diffuse arthritis, gout, peripheral neuropathy. As per daughter, patient also with pain with minimal movement of any part of her body. This is one reason why she has not participated in therapies in the past.  Adult failure to thrive - Patient used to be fully functional, driving and helping out other people until fall in May 2016. Since then she has not ambulated and mostly bedbound or moves around with the assistance of a wheelchair. Progressive steady decline. Poor overall long-term prognosis given advanced age, frail physical status, dementia, poor nutrition and concern for recurrent aspiration related to mental status, nonhealing and development of new pressure sores due to nonambulatory status. Discussed at length with patient's daughter/healthcare power of attorney who is an Therapist, sports with [place] [employer]. She is agreeable to palliative care consultation for goals of care.    - Palliative care input appreciated. Apparent disagreement in family. DO NOT RESUSCITATE confirmed. Youngest daughter wishes to take patient home with his other 3 siblings would like her to go to SNF with palliative care and eventually hospice. - As per psychiatry input, patient does not have capacity to make medical decisions and hence decision making will be deferred to patient's healthcare power of attorney Ms. Camille Bal. Unfortunately there seems to be disagreement between patient's youngest daughter Ms. Leodis Liverpool and healthcare power of attorney and other siblings.    DVT prophylaxis: Lovenox Code Status: DO NOT RESUSCITATE  Family  Communication: Discussed with patient's youngest daughter Ms. Leodis Liverpool at bedside on 3/27. Disposition Plan: To be determined. Not medically stable for discharge.   Consultants:  Palliative care team   Psychiatry  Nephrology  Procedures:  None   Antimicrobials:  Acyclovir 3/231 dose   IV cefepime 3/23 >  Metronidazole 3/221 dose  IV vancomycin 3/22 > 3/24  Subjective: Somnolent but easily arousable, oriented to self and mumbles incomprehensively. Daughter at bedside.  Objective: Filed Vitals:   01/24/16 0608 01/24/16 1517 01/24/16 2219 01/25/16 0525  BP: 147/55 157/52 169/55 147/56  Pulse: 72 63 71 66  Temp: 97.4 F (36.3 C) 98.5 F (36.9 C) 97.7 F (36.5 C) 98.8 F (37.1 C)  TempSrc: Oral Oral Oral Axillary  Resp: 18 16 15 17   Height:      Weight:      SpO2: 100% 99% 100% 99%    Intake/Output Summary (Last 24 hours) at 01/25/16 1645 Last data filed at 01/25/16 0700  Gross per 24 hour  Intake 2051.67 ml  Output      0 ml  Net 2051.67 ml   Filed Weights   01/21/16 0817 01/21/16 1129  Weight: 62.596 kg (138 lb) 62.596 kg (138 lb)    Exam:  General exam: Small built, frail, chronically ill-looking elderly female lying comfortably propped up in bed.  Respiratory system: poor inspiratory effort. Diminished breath sounds in the bases. Rest of lung fields clear to auscultation. No increased work of breathing. Cardiovascular system: S1 & S2 heard, RRR. No JVD, murmurs, gallops, clicks or pedal edema. Gastrointestinal system: Abdomen is nondistended, soft and nontender. Normal bowel sounds heard. Central nervous system: Somnolent but easily arousable and oriented only to self. No focal neurological deficits. Extremities: No acute findings. Chronic arthritic changes of both hands and fingers without  acute findings.   Data Reviewed: Basic Metabolic Panel:  Recent Labs Lab 01/21/16 0226 01/21/16 0434 01/22/16 0320 01/23/16 0500  01/24/16 0855 01/25/16 0430  NA 142  --  142 142 147* 143  K 4.3  --  4.3 4.3 4.4 4.5  CL 106  --  112* 112* 119* 114*  CO2  --   --  24 22 22 23   GLUCOSE 138*  --  62* 91 82 116*  BUN 45*  --  36* 40* 49* 56*  CREATININE 1.30* 1.35* 1.51* 1.76* 1.88* 1.86*  CALCIUM  --   --  9.3 9.4 9.7 9.5   Liver Function Tests:  Recent Labs Lab 01/21/16 0430  AST 20  ALT 9*  ALKPHOS 60  BILITOT 0.3  PROT 6.1*  ALBUMIN 2.0*   No results for input(s): LIPASE, AMYLASE in the last 168 hours. No results for input(s): AMMONIA in the last 168 hours. CBC:  Recent Labs Lab 01/21/16 0212 01/21/16 0226 01/22/16 0320 01/23/16 0500 01/24/16 0855  WBC 23.6*  --  20.4* 13.6* 12.9*  NEUTROABS 18.9*  --   --   --   --   HGB 8.7* 10.9* 7.4* 7.5* 7.6*  HCT 26.6* 32.0* 22.5* 22.7* 23.0*  MCV 98.2  --  96.6 95.4 95.8  PLT 364  --  258 266 258   Cardiac Enzymes: No results for input(s): CKTOTAL, CKMB, CKMBINDEX, TROPONINI in the last 168 hours. BNP (last 3 results) No results for input(s): PROBNP in the last 8760 hours. CBG:  Recent Labs Lab 01/24/16 1139 01/24/16 1720 01/24/16 2216 01/25/16 0822 01/25/16 1149  GLUCAP 91 92 127* 98 110*    Recent Results (from the past 240 hour(s))  Culture, blood (Routine X 2) w Reflex to ID Panel     Status: None (Preliminary result)   Collection Time: 01/21/16  4:30 AM  Result Value Ref Range Status   Specimen Description BLOOD BLOOD RIGHT HAND  Final   Special Requests BOTTLES DRAWN AEROBIC AND ANAEROBIC 6CC  Final   Culture   Final    NO GROWTH 4 DAYS Performed at [facility]    Report Status PENDING  Incomplete  Culture, blood (Routine X 2) w Reflex to ID Panel     Status: None (Preliminary result)   Collection Time: 01/21/16  4:30 AM  Result Value Ref Range Status   Specimen Description BLOOD BLOOD LEFT HAND  Final   Special Requests BOTTLES DRAWN AEROBIC AND ANAEROBIC [place]  Final   Culture   Final    NO GROWTH 4 DAYS Performed  at [facility]    Report Status PENDING  Incomplete  Urine culture     Status: None   Collection Time: 01/21/16  8:21 AM  Result Value Ref Range Status   Specimen Description URINE, CATHETERIZED  Final   Special Requests NONE  Final   Culture   Final    20,000 COLONIES/mL VANCOMYCIN RESISTANT ENTEROCOCCUS Performed at [facility]    Report Status 01/23/2016 FINAL  Final   Organism ID, Bacteria VANCOMYCIN RESISTANT ENTEROCOCCUS  Final      Susceptibility   Vancomycin resistant enterococcus - MIC*    AMPICILLIN >=32 RESISTANT Resistant     LEVOFLOXACIN >=8 RESISTANT Resistant     NITROFURANTOIN 256 RESISTANT Resistant     VANCOMYCIN >=32 RESISTANT Resistant     LINEZOLID 2 SENSITIVE Sensitive     * 20,000 COLONIES/mL VANCOMYCIN RESISTANT ENTEROCOCCUS  MRSA PCR Screening  Status: None   Collection Time: 01/21/16 12:45 PM  Result Value Ref Range Status   MRSA by PCR NEGATIVE NEGATIVE Final    Comment:        The GeneXpert MRSA Assay (FDA approved for NASAL specimens only), is one component of a comprehensive MRSA colonization surveillance program. It is not intended to diagnose MRSA infection nor to guide or monitor treatment for MRSA infections.          Studies: No results found.      Scheduled Meds: . ceFEPime (MAXIPIME) IV  1 g Intravenous Q24H  . divalproex  125 mg Oral BID  . donepezil  10 mg Oral QHS  . enoxaparin (LOVENOX) injection  30 mg Subcutaneous Q24H  . febuxostat  40 mg Oral Daily  . feeding supplement (ENSURE ENLIVE)  237 mL Oral BID BM  . feeding supplement (PRO-STAT SUGAR FREE 64)  30 mL Oral TID WC  . lactose free nutrition  237 mL Oral TID BM  . latanoprost  1 drop Both Eyes QHS  . magnesium hydroxide  30 mL Oral Daily  . memantine  28 mg Oral Daily  . mirtazapine  7.5 mg Oral QHS  . multivitamin with minerals  1 tablet Oral Daily  . pantoprazole  40 mg Oral Daily  . senna  1 tablet Oral BID  . sodium chloride flush   10-40 mL Intracatheter Q12H  . vitamin C  500 mg Oral BID   Continuous Infusions: . dextrose 100 mL/hr at 01/25/16 [id number]    Principal Problem:   Alzheimer's dementia Active Problems:   Essential hypertension   Dysphagia   Diabetes mellitus, type 2 (HCC)   Peripheral autonomic neuropathy due to diabetes mellitus (HCC)   Type II diabetes mellitus with peripheral circulatory disorder (HCC)   Pressure ulcer   Ulcer of left heel and midfoot with necrosis of muscle (HCC)   HCAP (healthcare-associated pneumonia)   Encounter for palliative care   Goals of care, counseling/discussion   Aspiration pneumonia ([place])   AKI (acute kidney injury) ([place])    Time spent: 25 minutes.    Vernell Leep, MD, FACP, FHM. [facility] Pager [phone]  If 7PM-7AM, please contact night-coverage [email or web address] Password [email or web address] 01/25/2016, 4:45 PM    LOS: 4 days

## 2016-01-25 NOTE — Consult Note (Signed)
Reason for Consult:  AKI Referring Physician:   Algis Liming, MD  Shari Mcmahon is an 82 y.o. female.  HPI: Pt is an 82yo AAF with PMH sig for dementia, DM, HTN, stomach cancer, severe protein malnutrition, PVD (not a candidate for revascularization of osteo of left calcaneus, and CKD who was admitted with lethargy and AMS.  Workup in the [facility] revealed RLL infiltrate presumably due to aspiration pneumonia (has h/o dysphagia) vs healthcare-related pneumonia.  She was admitted for IV antibiotics and was noted to have a rising Scr from 1.3 up to peak of 1.88.  Vancomycin was discontinued and we were asked to help evaluate her AKI/CKD.  She did receive IV Contrast on 01/11/16 to evaluate her osteomyelitis but no pre or post labs were drawn.   The trend in Scr is seen below.  Trend in Creatinine: CREATININE, SER  Date/Time Value Ref Range Status  01/25/2016 04:30 AM 1.86* 0.44 - 1.00 mg/dL Final  01/24/2016 08:55 AM 1.88* 0.44 - 1.00 mg/dL Final  01/23/2016 05:00 AM 1.76* 0.44 - 1.00 mg/dL Final  01/22/2016 03:20 AM 1.51* 0.44 - 1.00 mg/dL Final  01/21/2016 04:34 AM 1.35* 0.44 - 1.00 mg/dL Final  01/21/2016 02:26 AM 1.30* 0.44 - 1.00 mg/dL Final  06/16/2015 04:42 PM 0.90 0.44 - 1.00 mg/dL Final  05/08/2015 04:28 PM 0.98 0.44 - 1.00 mg/dL Final  03/05/2015 11:34 AM 1.32* 0.40 - 1.20 mg/dL Final  02/25/2015 11:02 AM 1.79* 0.40 - 1.20 mg/dL Final  02/18/2015 01:31 PM 1.59* 0.50 - 1.10 mg/dL Final  12/22/2014 04:15 PM 1.39* 0.40 - 1.20 mg/dL Final  10/28/2014 03:44 PM 1.3* 0.4 - 1.2 mg/dL Final  08/29/2014 11:52 AM 1.28* 0.50 - 1.10 mg/dL Final  08/13/2014 12:23 PM 1.6* 0.4 - 1.2 mg/dL Final  06/10/2014 10:41 AM 1.2 0.4 - 1.2 mg/dL Final  11/19/2013 09:52 AM 1.1 0.4 - 1.2 mg/dL Final  11/14/2012 09:47 AM 0.9 0.4 - 1.2 mg/dL Final  05/14/2012 08:32 AM 1.0 0.4 - 1.2 mg/dL Final  10/12/2011 09:22 AM 1.1 0.4 - 1.2 mg/dL Final  04/12/2011 08:34 AM 1.2 0.4 - 1.2 mg/dL Final  10/11/2010 10:28 AM 1.0  0.4-1.2 mg/dL Final  04/09/2010 09:36 AM 1.1 0.4-1.2 mg/dL Final  12/08/2009 10:30 AM 1.2 0.4-1.2 mg/dL Final  06/15/2009 09:21 AM 1.2 0.4-1.2 mg/dL Final  12/17/2008 10:39 AM 1.0 0.4-1.2 mg/dL Final  06/17/2008 09:04 AM 1.17 0.40 - 1.20 mg/dL Final  04/07/2008 11:10 AM 1.1 0.4-1.2 mg/dL Final  02/21/2008 10:30 AM 1.28* 0.40 - 1.20 mg/dL Final  06/05/2007 05:49 AM 1.02  Final  04/05/2007 11:59 AM 1.1 0.4-1.2 mg/dL Final  03/02/2007 03:04 PM 1.45* 0.40 - 1.20 mg/dL Final    PMH:   Past Medical History  Diagnosis Date  . ABDOMINAL PAIN, CHRONIC 04/07/2008  . ARTHRITIS 03/03/2008  . BRADYCARDIA, CHRONIC 12/17/2008  . CARDIAC MURMUR 02/01/2010  . CONSTIPATION, CHRONIC 10/15/2010  . DEGENERATIVE JOINT DISEASE, KNEES, BILATERAL 12/07/2007  . DEPRESSION 06/17/2009  . DIABETES MELLITUS, TYPE II 09/19/2007    DIET CONTROL   . DYSPHAGIA UNSPECIFIED 12/24/2009  . Gastroparesis 12/24/2009  . GENERALIZED OSTEOARTHROSIS INVOLVING HAND 10/03/2007  . GLAUCOMA 09/19/2007  . GOUTY ARTHROPATHY UNSPECIFIED 02/17/2009  . GOUT 09/19/2007  . HYPERLIPIDEMIA 01/27/2010  . HYPERTENSION 09/19/2007  . LUNG NODULE 03/03/2008  . MENOPAUSAL DISORDER 12/17/2008  . PERIPHERAL NEUROPATHY 06/19/2008  . PERSONAL HISTORY MALIGNANT NEOPLASM STOMACH 09/19/2007  . 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 STOMACH CANCER 03/03/2008  . UTI 12/15/2009  .  Arthritis of knee, degenerative 10/19/2011    PSH:   Past Surgical History  Procedure Laterality Date  . Vagotomy    . Partial gastrectomy    . Billroth ii gastorenterostomy    . Colonoscopy      Allergies:  Allergies  Allergen Reactions  . Voltaren [Diclofenac Sodium] Other (See Comments)    Stomach irritation  . Dulcolax [Bisacodyl]     Unknown   . Duloxetine Hcl Other (See Comments)    Made patient not want to eat or drink  . Penicillins Swelling    Unknown   . Timolol     REACTION: bradycardia worse    Medications:   Prior to Admission  medications   Medication Sig Start Date End Date Taking? Authorizing Provider  Amino Acids-Protein Hydrolys (FEEDING SUPPLEMENT, PRO-STAT SUGAR FREE 64,) LIQD Take 30 mLs by mouth 3 (three) times daily with meals. 11/15/15  Yes Gerlene Fee, NP  ceFEPIme 2 g in dextrose 5 % 50 mL Inject 2 g into the vein every 12 (twelve) hours. For 14 days   Yes Historical Provider, MD  colchicine 0.6 MG tablet Take 1 tablet (0.6 mg total) by mouth daily as needed. 10/29/15  Yes Gerlene Fee, NP  divalproex (DEPAKOTE SPRINKLES) 125 MG capsule Take 1 capsule (125 mg total) by mouth 2 (two) times daily. 06/17/15  Yes Biagio Borg, MD  donepezil (ARICEPT) 10 MG tablet Take 10 mg by mouth at bedtime.   Yes Historical Provider, MD  febuxostat (ULORIC) 40 MG tablet Take 40 mg by mouth daily.   Yes Historical Provider, MD  feeding supplement (BOOST HIGH PROTEIN) LIQD Take 1 Container by mouth 3 (three) times daily between meals.   Yes Historical Provider, MD  HYDROcodone-acetaminophen (NORCO/VICODIN) 5-325 MG tablet Take one tablet by mouth every 8 hours for pain. Not to exceed 3000mg  of APAP form all sources/24hr 09/04/15  Yes Tiffany L Reed, DO  insulin aspart (NOVOLOG) 100 UNIT/ML injection Inject 3 Units into the skin 3 (three) times daily before meals. For cbg >=150   Yes Historical Provider, MD  latanoprost (XALATAN) 0.005 % ophthalmic solution Place 1 drop into both eyes at bedtime.  07/28/14  Yes Historical Provider, MD  magnesium hydroxide (MILK OF MAGNESIA) 400 MG/5ML suspension Take 30 mLs by mouth daily.   Yes Historical Provider, MD  memantine (NAMENDA XR) 28 MG CP24 24 hr capsule Take 28 mg by mouth daily.   Yes Historical Provider, MD  mirtazapine (REMERON) 7.5 MG tablet Take 7.5 mg by mouth at bedtime.   Yes Historical Provider, MD  Multiple Vitamin tablet Take 1 tablet by mouth daily.   Yes Historical Provider, MD  omeprazole (PRILOSEC) 40 MG capsule Take 1 capsule (40 mg total) by mouth daily. 08/15/14   Yes Milus Banister, MD  oseltamivir (TAMIFLU) 75 MG capsule Take 75 mg by mouth daily. For 14 days   Yes Historical Provider, MD  senna (SENOKOT) 8.6 MG tablet Take 1 tablet by mouth 2 (two) times daily. Reported on 10/29/2015   Yes Historical Provider, MD  vitamin C (ASCORBIC ACID) 500 MG tablet Take 500 mg by mouth 2 (two) times daily.   Yes Historical Provider, MD    Inpatient medications: . ceFEPime (MAXIPIME) IV  1 g Intravenous Q24H  . divalproex  125 mg Oral BID  . donepezil  10 mg Oral QHS  . enoxaparin (LOVENOX) injection  30 mg Subcutaneous Q24H  . febuxostat  40 mg Oral Daily  .  feeding supplement (ENSURE ENLIVE)  237 mL Oral BID BM  . feeding supplement (PRO-STAT SUGAR FREE 64)  30 mL Oral TID WC  . lactose free nutrition  237 mL Oral TID BM  . latanoprost  1 drop Both Eyes QHS  . magnesium hydroxide  30 mL Oral Daily  . memantine  28 mg Oral Daily  . mirtazapine  7.5 mg Oral QHS  . multivitamin with minerals  1 tablet Oral Daily  . pantoprazole  40 mg Oral Daily  . senna  1 tablet Oral BID  . sodium chloride flush  10-40 mL Intracatheter Q12H  . vitamin C  500 mg Oral BID    Discontinued Meds:   Medications Discontinued During This Encounter  Medication Reason  . memantine (NAMENDA XR) 14 MG CP24 24 hr capsule Dose change  . zinc sulfate 220 MG capsule Discontinued by provider  . acyclovir (ZOVIRAX) 625 mg in dextrose 5 % 100 mL IVPB   . ceFEPIme (MAXIPIME) 2 g in dextrose 5 % 50 mL IVPB   . Multiple Vitamin 1 tablet Formulary change  . feeding supplement (BOOST HIGH PROTEIN) liquid 237 mL Formulary change  . insulin aspart (novoLOG) injection 0-9 Units   . insulin aspart (novoLOG) injection 0-5 Units   . 0.9 %  sodium chloride infusion   . 0.9 %  sodium chloride infusion   . vancomycin (VANCOCIN) IVPB 750 mg/150 ml premix   . 0.9 %  sodium chloride infusion   . dextrose 5 % solution     Social History:  reports that she has quit smoking. Her smoking use  included Cigarettes. She has never used smokeless tobacco. She reports that she does not drink alcohol or use illicit drugs.  Family History:   Family History  Problem Relation Age of Onset  . Uterine cancer Mother   . Stroke Father     Hemorrhagic  . Cancer Sister     breast cancer and one sister with uterine cancer and one sister with ovarian cancerr  . Colon cancer Maternal Grandmother   . Colon cancer Paternal Grandfather   . Diabetes Other   . Stomach cancer Neg Hx   . Prostate cancer Brother     Review of systems not obtained due to patient factors. Weight change:   Intake/Output Summary (Last 24 hours) at 01/25/16 1200 Last data filed at 01/25/16 0700  Gross per 24 hour  Intake 2171.67 ml  Output      0 ml  Net 2171.67 ml   BP 147/56 mmHg  Pulse 66  Temp(Src) 98.8 F (37.1 C) (Axillary)  Resp 17  Ht 5\' 6"  (1.676 m)  Wt 62.596 kg (138 lb)  BMI 22.28 kg/m2  SpO2 99% Filed Vitals:   01/24/16 0608 01/24/16 1517 01/24/16 2219 01/25/16 0525  BP: 147/55 157/52 169/55 147/56  Pulse: 72 63 71 66  Temp: 97.4 F (36.3 C) 98.5 F (36.9 C) 97.7 F (36.5 C) 98.8 F (37.1 C)  TempSrc: Oral Oral Oral Axillary  Resp: 18 16 15 17   Height:      Weight:      SpO2: 100% 99% 100% 99%     General appearance: cachectic, delirious, fatigued and no distress Head: atraumatic, bitemporal wasting Resp: clear to auscultation bilaterally Cardio: no rub GI: soft, non-tender; bowel sounds normal; no masses,  no organomegaly Extremities: No edema, some ulcerations of toes bilaterally, feet are in soft heel boots and wrappings on feet   Labs: Basic Metabolic Panel:  Recent Labs Lab 01/21/16 0226 01/21/16 0430 01/21/16 0434 01/22/16 0320 01/23/16 0500 01/24/16 0855 01/25/16 0430  NA 142  --   --  142 142 147* 143  K 4.3  --   --  4.3 4.3 4.4 4.5  CL 106  --   --  112* 112* 119* 114*  CO2  --   --   --  24 22 22 23   GLUCOSE 138*  --   --  62* 91 82 116*  BUN 45*  --   --   36* 40* 49* 56*  CREATININE 1.30*  --  1.35* 1.51* 1.76* 1.88* 1.86*  ALBUMIN  --  2.0*  --   --   --   --   --   CALCIUM  --   --   --  9.3 9.4 9.7 9.5   Liver Function Tests:  Recent Labs Lab 01/21/16 0430  AST 20  ALT 9*  ALKPHOS 60  BILITOT 0.3  PROT 6.1*  ALBUMIN 2.0*   No results for input(s): LIPASE, AMYLASE in the last 168 hours. No results for input(s): AMMONIA in the last 168 hours. CBC:  Recent Labs Lab 01/21/16 0212 01/21/16 0226 01/22/16 0320 01/23/16 0500 01/24/16 0855  WBC 23.6*  --  20.4* 13.6* 12.9*  NEUTROABS 18.9*  --   --   --   --   HGB 8.7* 10.9* 7.4* 7.5* 7.6*  HCT 26.6* 32.0* 22.5* 22.7* 23.0*  MCV 98.2  --  96.6 95.4 95.8  PLT 364  --  258 266 258   PT/INR: @LABRCNTIP (inr:5) Cardiac Enzymes: )No results for input(s): CKTOTAL, CKMB, CKMBINDEX, TROPONINI in the last 168 hours. CBG:  Recent Labs Lab 01/24/16 1139 01/24/16 1720 01/24/16 2216 01/25/16 0822 01/25/16 1149  GLUCAP 91 92 127* 98 110*    Iron Studies: No results for input(s): IRON, TIBC, TRANSFERRIN, FERRITIN in the last 168 hours.  Xrays/Other Studies: CLINICAL DATA: 82 year old female with a history of acute kidney injury and diabetes  EXAM: RENAL / URINARY TRACT ULTRASOUND COMPLETE  COMPARISON: CT 12/25/2014  FINDINGS: Right Kidney:  Length: 8.1 cm. Echogenicity of the right kidney similar to the adjacent liver. No hydronephrosis. Flow confirmed in the hilum.  Left Kidney:  Length: 7.8 cm. Echogenicity of the left kidney relatively symmetric to the right. No spleen confidently visualized.  No hydronephrosis. Flow appears present at the left kidney hilum.  Bladder:  Appears normal for degree of bladder distention.  IMPRESSION: Sonographic survey demonstrates no hydronephrosis.  Signed,  Dulcy Fanny. Earleen Newport, DO  [facility]  [facility]   Electronically Signed  By: Corrie Mckusick  D.O.  On: 01/23/2016 10:26   Assessment/Plan: 1.  AKI- in setting of acute illness and decreased po intake.  Will check FeNa and SPEP/UPEP and follow UOP and Scr.  Unfortunately pt is incontinent so not able to follow UOP accurately.  Would not recommend aggressive therapy given her multiple, irreversible co-morbidities and poor functional status.   2. HCAP vs aspiration pneumonia- per primary 3. Anemia, normocytic- will check SPEP/UPEP 4. DM- per primary  5. PVD- inoperable 6. Dementia- not able to make decisions for herself per Psych. 7. Pressure ulcers 8. HTN- stable 9. Dispo- overall prognosis poor.  Agree with DNR and appreciate Palliative Care input.   Broadus John A Thomos Domine 01/25/2016, 12:00 PM

## 2016-01-25 NOTE — Consult Note (Signed)
[facility] Face-to-Face Psychiatry Consult   Reason for Consult:  Dementia Referring Physician:  Dr. Janeann Merl Patient Identification: Shari Mcmahon MRN:  [id number] Principal Diagnosis: Alzheimer's dementia Diagnosis:   Patient Active Problem List   Diagnosis Date Noted  . Goals of care, counseling/discussion [Z71.89]   . Aspiration pneumonia ([place]) [J69.0]   . AKI (acute kidney injury) ([place]) [N17.9]   . Encounter for palliative care [Z51.5]   . HCAP (healthcare-associated pneumonia) [J18.9] 01/21/2016  . Ulcer of left heel and midfoot with necrosis of muscle ([place]) [L97.423] 01/17/2016  . Type II diabetes mellitus with peripheral circulatory disorder ([place]) [E11.51] 01/01/2016  . Pressure ulcer [L89.90] 01/01/2016  . Peripheral autonomic neuropathy due to diabetes mellitus ([place]) [E11.43] 11/15/2015  . GERD without esophagitis [K21.9] 10/30/2015  . Alzheimer's dementia [G30.9, F02.80] 07/28/2015  . Diabetes mellitus, type 2 ([name]) [E11.9] 07/28/2015  . Fracture of right femur ([place]) [S72.91XA] 07/28/2015  . Arthritis of knee [M12.9] 05/19/2015  . Anemia [D64.9] 02/25/2015  . Loss of weight [R63.4] 09/17/2014  . Chronic pain syndrome [G89.4] 06/16/2014  . Primary localized osteoarthrosis, lower leg [M17.10] 01/08/2014  . Rotator cuff tear arthropathy of both shoulders [M12.811, M12.812] 12/09/2013  . CONSTIPATION, CHRONIC [K59.09] 10/15/2010  . Hyperlipidemia [E78.5] 01/27/2010  . Gastroparesis [K31.84] 12/24/2009  . Dysphagia [R13.10] 12/24/2009  . DEPRESSION [F32.9] 06/17/2009  . BRADYCARDIA, CHRONIC [I49.8] 12/17/2008  . PERIPHERAL NEUROPATHY [G60.9] 06/19/2008  . LUNG NODULE [J98.4] 03/03/2008  . GENERALIZED OSTEOARTHROSIS INVOLVING HAND [M15.9] 10/03/2007  . Gout [M10.9] 09/19/2007  . GLAUCOMA [H40.9] 09/19/2007  . Essential hypertension [I10] 09/19/2007    Total Time spent with patient: 45 minutes  Subjective:   Shari Mcmahon is a 82 y.o. female patient admitted with  AMS.  HPI:  Shari Mcmahon is an 82 y.o. Female seen, chart reviewed and case discussed with the patient daughter [name] and May who were at bedside and also with the Dr. Algis Liming. Patient appeared lying in her bed, somewhat drowsy but easily awakened and response verbally to the queries. Patient has been a resident of skilled nursing facility "[facility]" for the last 6 months but did not made any progress in her physical rehabilitation. Patient complaints staff did not respond her personal needs about 3-4 hours at a time and worried about cannot get the services she needed and asking to go home with her daughters. Patient is also suffering with Alzheimer's dementia and has been receiving to different medication. Patient has orientation to her first name, last name, date of birth and names of her children and knows he is feeling sick and not well. Patient could not identify her complex and complicated medical problems and required treatment. Patient cannot seek medication treatment by herself without any support her family members. Patient younger daughter May has been emotional and argumentative with her sister regarding taking her into her home and also getting medical care power of attorney. Reportedly [name] has medical care power of attorney who gets to make decisions at this time as patient does not have capacity to make her own medical care decisions and living arrangements. Patient has been placed on Depakote sprinkles but she does not have any indication to continue this medication at this time.  Past Psychiatric History:  Patient has been diagnosed with multiple medical problems and Alzheimer's Dementia and has been on treatment with medications. Patient has no previous acute psychiatric hospitalizations.   Risk to Self: Is patient at risk for suicide?: No Risk to Others:  Prior Inpatient Therapy:   Prior Outpatient Therapy:    Past Medical History:  Past Medical History  Diagnosis Date   . ABDOMINAL PAIN, CHRONIC 04/07/2008  . ARTHRITIS 03/03/2008  . BRADYCARDIA, CHRONIC 12/17/2008  . CARDIAC MURMUR 02/01/2010  . CONSTIPATION, CHRONIC 10/15/2010  . DEGENERATIVE JOINT DISEASE, KNEES, BILATERAL 12/07/2007  . DEPRESSION 06/17/2009  . DIABETES MELLITUS, TYPE II 09/19/2007    DIET CONTROL   . DYSPHAGIA UNSPECIFIED 12/24/2009  . Gastroparesis 12/24/2009  . GENERALIZED OSTEOARTHROSIS INVOLVING HAND 10/03/2007  . GLAUCOMA 09/19/2007  . GOUTY ARTHROPATHY UNSPECIFIED 02/17/2009  . GOUT 09/19/2007  . HYPERLIPIDEMIA 01/27/2010  . HYPERTENSION 09/19/2007  . LUNG NODULE 03/03/2008  . MENOPAUSAL DISORDER 12/17/2008  . PERIPHERAL NEUROPATHY 06/19/2008  . PERSONAL HISTORY MALIGNANT NEOPLASM STOMACH 09/19/2007  . 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 STOMACH CANCER 03/03/2008  . UTI 12/15/2009  . Arthritis of knee, degenerative 10/19/2011    Past Surgical History  Procedure Laterality Date  . Vagotomy    . Partial gastrectomy    . Billroth ii gastorenterostomy    . Colonoscopy     Family History:  Family History  Problem Relation Age of Onset  . Uterine cancer Mother   . Stroke Father     Hemorrhagic  . Cancer Sister     breast cancer and one sister with uterine cancer and one sister with ovarian cancerr  . Colon cancer Maternal Grandmother   . Colon cancer Paternal Grandfather   . Diabetes Other   . Stomach cancer Neg Hx   . Prostate cancer Brother    Family Psychiatric  History: Patient has 3 daughters and 3 sons. Patient has no family history of psychiatric illness  Social History:  History  Alcohol Use No     History  Drug Use No    Social History   Social History  . Marital Status: Widowed    Spouse Name: N/A  . Number of Children: 6  . Years of Education: 8   Occupational History  . retired    Social History Main Topics  . Smoking status: Former Smoker    Types: Cigarettes  . Smokeless tobacco: Never Used     Comment: use to smoke a pack  of cigaretts a day for 25 yrs. but stopped 10 yrs. ago.   . Alcohol Use: No  . Drug Use: No  . Sexual Activity: Not Asked   Other Topics Concern  . None   Social History Narrative   Lives with her daughter   Additional Social History:    Allergies:   Allergies  Allergen Reactions  . Voltaren [Diclofenac Sodium] Other (See Comments)    Stomach irritation  . Dulcolax [Bisacodyl]     Unknown   . Duloxetine Hcl Other (See Comments)    Made patient not want to eat or drink  . Penicillins Swelling    Unknown   . Timolol     REACTION: bradycardia worse    Labs:  Results for orders placed or performed during the hospital encounter of 01/21/16 (from the past 48 hour(s))  Glucose, capillary     Status: None   Collection Time: 01/23/16 12:20 PM  Result Value Ref Range   Glucose-Capillary 97 65 - 99 mg/dL  Glucose, capillary     Status: None   Collection Time: 01/23/16  5:03 PM  Result Value Ref Range   Glucose-Capillary 85 65 - 99 mg/dL  Glucose, capillary     Status: None  Collection Time: 01/23/16 10:07 PM  Result Value Ref Range   Glucose-Capillary 95 65 - 99 mg/dL  Glucose, capillary     Status: None   Collection Time: 01/24/16  7:44 AM  Result Value Ref Range   Glucose-Capillary 80 65 - 99 mg/dL  Basic metabolic panel     Status: Abnormal   Collection Time: 01/24/16  8:55 AM  Result Value Ref Range   Sodium 147 (H) 135 - 145 mmol/L   Potassium 4.4 3.5 - 5.1 mmol/L   Chloride 119 (H) 101 - 111 mmol/L   CO2 22 22 - 32 mmol/L   Glucose, Bld 82 65 - 99 mg/dL   BUN 49 (H) 6 - 20 mg/dL   Creatinine, Ser 1.88 (H) 0.44 - 1.00 mg/dL   Calcium 9.7 8.9 - 10.3 mg/dL   GFR calc non Af Amer 23 (L) >60 mL/min   GFR calc Af Amer 27 (L) >60 mL/min    Comment: (NOTE) The eGFR has been calculated using the CKD EPI equation. This calculation has not been validated in all clinical situations. eGFR's persistently <60 mL/min signify possible Chronic Kidney Disease.    Anion gap  6 5 - 15  CBC     Status: Abnormal   Collection Time: 01/24/16  8:55 AM  Result Value Ref Range   WBC 12.9 (H) 4.0 - 10.5 K/uL   RBC 2.40 (L) 3.87 - 5.11 MIL/uL   Hemoglobin 7.6 (L) 12.0 - 15.0 g/dL   HCT 23.0 (L) 36.0 - 46.0 %   MCV 95.8 78.0 - 100.0 fL   MCH 31.7 26.0 - 34.0 pg   MCHC 33.0 30.0 - 36.0 g/dL   RDW 18.6 (H) 11.5 - 15.5 %   Platelets 258 150 - 400 K/uL  Glucose, capillary     Status: None   Collection Time: 01/24/16 11:39 AM  Result Value Ref Range   Glucose-Capillary 91 65 - 99 mg/dL  Glucose, capillary     Status: None   Collection Time: 01/24/16  5:20 PM  Result Value Ref Range   Glucose-Capillary 92 65 - 99 mg/dL  Glucose, capillary     Status: Abnormal   Collection Time: 01/24/16 10:16 PM  Result Value Ref Range   Glucose-Capillary 127 (H) 65 - 99 mg/dL   Comment 1 Notify RN    Comment 2 Document in Chart   Basic metabolic panel     Status: Abnormal   Collection Time: 01/25/16  4:30 AM  Result Value Ref Range   Sodium 143 135 - 145 mmol/L   Potassium 4.5 3.5 - 5.1 mmol/L   Chloride 114 (H) 101 - 111 mmol/L   CO2 23 22 - 32 mmol/L   Glucose, Bld 116 (H) 65 - 99 mg/dL   BUN 56 (H) 6 - 20 mg/dL   Creatinine, Ser 1.86 (H) 0.44 - 1.00 mg/dL   Calcium 9.5 8.9 - 10.3 mg/dL   GFR calc non Af Amer 23 (L) >60 mL/min   GFR calc Af Amer 27 (L) >60 mL/min    Comment: (NOTE) The eGFR has been calculated using the CKD EPI equation. This calculation has not been validated in all clinical situations. eGFR's persistently <60 mL/min signify possible Chronic Kidney Disease.    Anion gap 6 5 - 15  Glucose, capillary     Status: None   Collection Time: 01/25/16  8:22 AM  Result Value Ref Range   Glucose-Capillary 98 65 - 99 mg/dL    Current Facility-Administered  Medications  Medication Dose Route Frequency Provider Last Rate Last Dose  . acetaminophen (TYLENOL) tablet 650 mg  650 mg Oral Q6H PRN Venetia Maxon Rama, MD   650 mg at 01/22/16 1135   Or  .  acetaminophen (TYLENOL) suppository 650 mg  650 mg Rectal Q6H PRN Christina P Rama, MD      . ceFEPIme (MAXIPIME) 1 g in dextrose 5 % 50 mL IVPB  1 g Intravenous Q24H Donald Prose Runyon, RPH   1 g at 01/24/16 1854  . dextrose 5 % solution   Intravenous Continuous Modena Jansky, MD 100 mL/hr at 01/25/16 0844    . divalproex (DEPAKOTE SPRINKLE) capsule 125 mg  125 mg Oral BID Venetia Maxon Rama, MD   125 mg at 01/25/16 1031  . donepezil (ARICEPT) tablet 10 mg  10 mg Oral QHS Venetia Maxon Rama, MD   10 mg at 01/24/16 2300  . enoxaparin (LOVENOX) injection 30 mg  30 mg Subcutaneous Q24H Venetia Maxon Rama, MD   30 mg at 01/24/16 2300  . febuxostat (ULORIC) tablet 40 mg  40 mg Oral Daily Venetia Maxon Rama, MD   40 mg at 01/25/16 1031  . feeding supplement (ENSURE ENLIVE) (ENSURE ENLIVE) liquid 237 mL  237 mL Oral BID BM Modena Jansky, MD   237 mL at 01/25/16 1032  . feeding supplement (PRO-STAT SUGAR FREE 64) liquid 30 mL  30 mL Oral TID WC Christina P Rama, MD   30 mL at 01/25/16 1031  . HYDROcodone-acetaminophen (NORCO/VICODIN) 5-325 MG per tablet 1 tablet  1 tablet Oral Q6H PRN Venetia Maxon Rama, MD   1 tablet at 01/24/16 2338  . lactose free nutrition (BOOST PLUS) liquid 237 mL  237 mL Oral TID BM Venetia Maxon Rama, MD   237 mL at 01/24/16 2000  . latanoprost (XALATAN) 0.005 % ophthalmic solution 1 drop  1 drop Both Eyes QHS Venetia Maxon Rama, MD   1 drop at 01/24/16 2300  . magnesium hydroxide (MILK OF MAGNESIA) suspension 30 mL  30 mL Oral Daily Venetia Maxon Rama, MD   30 mL at 01/25/16 1032  . memantine (NAMENDA XR) 24 hr capsule 28 mg  28 mg Oral Daily Venetia Maxon Rama, MD   28 mg at 01/25/16 1031  . mirtazapine (REMERON) tablet 7.5 mg  7.5 mg Oral QHS Venetia Maxon Rama, MD   7.5 mg at 01/24/16 2300  . multivitamin with minerals tablet 1 tablet  1 tablet Oral Daily Venetia Maxon Rama, MD   1 tablet at 01/25/16 1031  . ondansetron (ZOFRAN) tablet 4 mg  4 mg Oral Q6H PRN Christina P Rama, MD       Or  .  ondansetron (ZOFRAN) injection 4 mg  4 mg Intravenous Q6H PRN Christina P Rama, MD      . pantoprazole (PROTONIX) EC tablet 40 mg  40 mg Oral Daily Venetia Maxon Rama, MD   40 mg at 01/25/16 1031  . senna (SENOKOT) tablet 8.6 mg  1 tablet Oral BID Venetia Maxon Rama, MD   8.6 mg at 01/25/16 1031  . sodium chloride flush (NS) 0.9 % injection 10-40 mL  10-40 mL Intracatheter Q12H Venetia Maxon Rama, MD   10 mL at 01/23/16 0934  . sodium chloride flush (NS) 0.9 % injection 10-40 mL  10-40 mL Intracatheter PRN Christina P Rama, MD      . vitamin C (ASCORBIC ACID) tablet 500 mg  500 mg Oral BID Christina P Rama,  MD   500 mg at 01/25/16 1031    Musculoskeletal: Strength & Muscle Tone: decreased Gait & Station: unable to stand Patient leans: N/A  Psychiatric Specialty Exam: Review of Systems  Constitutional: Positive for malaise/fatigue.  Skin: Positive for rash.  Neurological: Positive for weakness.  All other systems reviewed and are negative.   Blood pressure 147/56, pulse 66, temperature 98.8 F (37.1 C), temperature source Axillary, resp. rate 17, height 5' 6"  (1.676 m), weight 62.596 kg (138 lb), SpO2 99 %.Body mass index is 22.28 kg/(m^2).  General Appearance: Casual  Eye Contact::  Fair  Speech:  Blocked and Clear and Coherent  Volume:  Decreased  Mood:  Depressed  Affect:  Constricted and Depressed  Thought Process:  Coherent and Goal Directed  Orientation:  Other:  Oriented to her first name, last name, names of her children and date of birth only  Thought Content:  Rumination  Suicidal Thoughts:  No  Homicidal Thoughts:  No  Memory:  Immediate;   Fair Recent;   Poor Remote;   Good  Judgement:  Impaired  Insight:  Lacking  Psychomotor Activity:  Decreased  Concentration:  Fair  Recall:  Moro: Fair  Akathisia:  Negative  Handed:  Right  AIMS (if indicated):     Assets:  Communication Skills Desire for Improvement Financial  Resources/Insurance Housing Leisure Time Resilience Social Support Transportation  ADL's:  Impaired  Cognition: Impaired,  Moderate  Sleep:      Treatment Plan Summary:  Case discussed with the Dr. Algis Liming regarding her medication and also competency evaluation and in agreement with the discontinuation of Depakote to prevent excessive sedation and has no current indication.  Patient does not meet criteria for capacity to make her own medical decisions as she does not understand extent of complex and complicated medical problems and required treatment.  Patient daughter reportedly has medical care power of attorney and other daughter also interested in taking care of her mother. Both daughters are at bedside likes to care for her.  Discontinue Depakote sprinkles as patient has no indication to continue this medication during my ideation.  Recommend to continue Namenda 28 mg daily and Aricept 10 mg daily for dementia and Remeron 7.5 mg at bedtime for insomnia and poor appetite.  Appreciate psychiatric consultation and we sign off as of today Please contact [phone] or [phone] if needs further assistance  Disposition: Patient will be better served at home environment with the home health care team and family support,  Family accepted that patient won't be able to make physical progress even after stay in skilled nursing facility with rehabilitation for 6 months  Patient does not meet criteria for psychiatric inpatient admission. Supportive therapy provided about ongoing stressors.  Durward Parcel., MD 01/25/2016 10:57 AM

## 2016-01-25 NOTE — Progress Notes (Signed)
CSW spoke with pt's daughter, [name], this afternoon. [name] is concerned but will not interfere with pt's  plan to d/c to [facility] home at d/c. [name] would like hospice to follow at home. CSW provided [name] with APS # in case there are concerns with pt's care once d/c. RNCM will assist with d/c planning.   Werner Lean LCSW [phone]

## 2016-01-25 NOTE — Progress Notes (Signed)
Daily Progress Note   Patient Name: Shari Mcmahon       Date: 01/25/2016 DOB: [birth year]  Age: 82 y.o. MRN#: [id number] Attending Physician: Modena Jansky, MD Primary Care Physician: Gildardo Cranker, DO Admit Date: 01/21/2016  Reason for Consultation/Follow-up: Establishing goals of care  Subjective:  resting comfortably, no family at bedside at the time of my visit this am.   Interval Events:  discussed with sw and Dr Algis Liming: there remains conflict between the patient's hcpoa daughter [name] and the patient's youngest daughter regarding optimal D/C option.  [name] would like for the patient to go back to [facility], she is accepting of palliative support over there. She wants the patient to continue with current mode of care, IVF, IV Abx etc. Gentle treatments. [name] understands the irreversible nature of the patient's vascular ischemia, dementia, asp pna.  Agree with Dr. Janelle Floor recommendations to seek input from renal and psych today.  Call placed and discussed extensively again with daughter [name], offered her support and validated her concerns.  Palliative will remain available to facilitate discussions and to help with assistance for disposition planning.   Length of Stay: 4 days  Current Medications: Scheduled Meds:  . ceFEPime (MAXIPIME) IV  1 g Intravenous Q24H  . divalproex  125 mg Oral BID  . donepezil  10 mg Oral QHS  . enoxaparin (LOVENOX) injection  30 mg Subcutaneous Q24H  . febuxostat  40 mg Oral Daily  . feeding supplement (ENSURE ENLIVE)  237 mL Oral BID BM  . feeding supplement (PRO-STAT SUGAR FREE 64)  30 mL Oral TID WC  . lactose free nutrition  237 mL Oral TID BM  . latanoprost  1 drop Both Eyes QHS  . magnesium hydroxide  30 mL Oral Daily  .  memantine  28 mg Oral Daily  . mirtazapine  7.5 mg Oral QHS  . multivitamin with minerals  1 tablet Oral Daily  . pantoprazole  40 mg Oral Daily  . senna  1 tablet Oral BID  . sodium chloride flush  10-40 mL Intracatheter Q12H  . vitamin C  500 mg Oral BID    Continuous Infusions: . dextrose 100 mL/hr at 01/25/16 0844    PRN Meds: acetaminophen **OR** acetaminophen, HYDROcodone-acetaminophen, ondansetron **OR** ondansetron (ZOFRAN) IV, sodium chloride flush  Physical Exam:  Physical Exam             Frail weak elderly lady Patient opens eyes, is able to answer few questions appropriately S1 S2 Clear shallow Abdomen soft No edema LE non healing wounds   Vital Signs: BP 147/56 mmHg  Pulse 66  Temp(Src) 98.8 F (37.1 C) (Axillary)  Resp 17  Ht 5\' 6"  (1.676 m)  Wt 62.596 kg (138 lb)  BMI 22.28 kg/m2  SpO2 99% SpO2: SpO2: 99 % O2 Device: O2 Device: Nasal Cannula O2 Flow Rate: O2 Flow Rate (L/min): 2 L/min  Intake/output summary:  Intake/Output Summary (Last 24 hours) at 01/25/16 1445 Last data filed at 01/25/16 0700  Gross per 24 hour  Intake 2171.67 ml  Output      0 ml  Net 2171.67 ml   LBM: Last BM Date: 01/25/16 Baseline Weight: Weight: 62.596 kg (138 lb) (per daughter) Most recent weight: Weight: 62.596 kg (138 lb)       Palliative Assessment/Data: Flowsheet Rows        Most Recent Value   Intake Tab    Referral Department  Hospitalist   Unit at Time of Referral  Med/Surg Unit   Palliative Care Primary Diagnosis  Neurology   Palliative Care Type  Return patient Palliative Care   Reason for referral  Clarify Goals of Care   Clinical Assessment    Palliative Performance Scale Score  30%   Pain Max last 24 hours  4   Pain Min Last 24 hours  3   Dyspnea Max Last 24 Hours  3   Dyspnea Min Last 24 hours  2   Psychosocial & Spiritual Assessment    Palliative Care Outcomes    Patient/Family meeting held?  Yes   Who was at the meeting?  dtr jackie hcpoa  over the phone.    Palliative Care follow-up planned  Yes, Facility      Additional Data Reviewed: CBC    Component Value Date/Time   WBC 12.9* 01/24/2016 0855   WBC 6.2 06/17/2008 0904   RBC 2.40* 01/24/2016 0855   RBC 3.53* 06/17/2008 0904   HGB 7.6* 01/24/2016 0855   HGB 12.1 06/17/2008 0904   HCT 23.0* 01/24/2016 0855   HCT 36.3 06/17/2008 0904   PLT 258 01/24/2016 0855   PLT 126 Occas Small Plt Clump* 06/17/2008 0904   MCV 95.8 01/24/2016 0855   MCV 102.7* 06/17/2008 0904   MCH 31.7 01/24/2016 0855   MCH 34.3* 06/17/2008 0904   MCHC 33.0 01/24/2016 0855   MCHC 33.4 06/17/2008 0904   RDW 18.6* 01/24/2016 0855   RDW 14.3 06/17/2008 0904   LYMPHSABS 2.7 01/21/2016 0212   LYMPHSABS 1.6 06/17/2008 0904   MONOABS 1.8* 01/21/2016 0212   MONOABS 1.0* 06/17/2008 0904   EOSABS 0.2 01/21/2016 0212   EOSABS 0.1 06/17/2008 0904   BASOSABS 0.0 01/21/2016 0212   BASOSABS 0.1 06/17/2008 0904    CMP     Component Value Date/Time   NA 143 01/25/2016 0430   K 4.5 01/25/2016 0430   CL 114* 01/25/2016 0430   CO2 23 01/25/2016 0430   GLUCOSE 116* 01/25/2016 0430   BUN 56* 01/25/2016 0430   CREATININE 1.86* 01/25/2016 0430   CALCIUM 9.5 01/25/2016 0430   PROT 6.1* 01/21/2016 0430   ALBUMIN 2.0* 01/21/2016 0430   AST 20 01/21/2016 0430   ALT 9* 01/21/2016 0430   ALKPHOS 60 01/21/2016 0430   BILITOT 0.3 01/21/2016 0430   GFRNONAA 23* 01/25/2016  0430   GFRAA 27* 01/25/2016 0430       Problem List:  Patient Active Problem List   Diagnosis Date Noted  . Goals of care, counseling/discussion   . Aspiration pneumonia ([place])   . AKI (acute kidney injury) ([place])   . Encounter for palliative care   . HCAP (healthcare-associated pneumonia) 01/21/2016  . Ulcer of left heel and midfoot with necrosis of muscle ([place]) 01/17/2016  . Type II diabetes mellitus with peripheral circulatory disorder ([place]) 01/01/2016  . Pressure ulcer 01/01/2016  . Peripheral autonomic neuropathy due to  diabetes mellitus ([place]) 11/15/2015  . GERD without esophagitis 10/30/2015  . Alzheimer's dementia 07/28/2015  . Diabetes mellitus, type 2 ([place]) 07/28/2015  . Fracture of right femur ([place]) 07/28/2015  . Arthritis of knee 05/19/2015  . Anemia 02/25/2015  . Loss of weight 09/17/2014  . Chronic pain syndrome 06/16/2014  . Primary localized osteoarthrosis, lower leg 01/08/2014  . Rotator cuff tear arthropathy of both shoulders 12/09/2013  . CONSTIPATION, CHRONIC 10/15/2010  . Hyperlipidemia 01/27/2010  . Gastroparesis 12/24/2009  . Dysphagia 12/24/2009  . DEPRESSION 06/17/2009  . BRADYCARDIA, CHRONIC 12/17/2008  . PERIPHERAL NEUROPATHY 06/19/2008  . LUNG NODULE 03/03/2008  . GENERALIZED OSTEOARTHROSIS INVOLVING HAND 10/03/2007  . Gout 09/19/2007  . GLAUCOMA 09/19/2007  . Essential hypertension 09/19/2007     Palliative Care Assessment & Plan    1.Code Status:  DNR    Code Status Orders        Start     Ordered   01/21/16 1152  Do not attempt resuscitation (DNR)   Continuous    Question Answer Comment  In the event of cardiac or respiratory ARREST Do not call a "code blue"   In the event of cardiac or respiratory ARREST Do not perform Intubation, CPR, defibrillation or ACLS   In the event of cardiac or respiratory ARREST Use medication by any route, position, wound care, and other measures to relive pain and suffering. May use oxygen, suction and manual treatment of airway obstruction as needed for comfort.      01/21/16 1151    Code Status History    Date Active Date Inactive Code Status Order ID Comments User Context   01/21/2016  9:21 AM 01/21/2016 11:51 AM DNR [id number]  Jola Schmidt, MD ED    Advance Directive Documentation        Most Recent Value   Type of Advance Directive  Out of facility DNR (pink MOST or yellow form)   Pre-existing out of facility DNR order (yellow form or pink MOST form)  Physician notified to receive inpatient order   "MOST" Form in Place?          2. Goals of Care/Additional Recommendations:   Desire for further Chaplaincy support:no  Psycho-social Needs: Crisis Intervention: inter family conflict amongst patient's 2 daughters regarding discharge/ disposition options.   3. Symptom Management:      1. Continue current mode of care.   4. Palliative Prophylaxis:   Delirium Protocol  5. Prognosis: Unable to determine  6. Discharge Planning:   pending further discussions. Patient came from [facility] facility.    Care plan was discussed with  Patient, RN, SW, Dr Algis Liming, patient's hcpoa daughter [name] over the phone.   Thank you for allowing the Palliative Medicine Team to assist in the care of this patient.   Time In:  8 Time Out: 8.25 Total Time 25 Prolonged Time Billed  no       336  Palmer, MD  01/25/2016, 2:45 PM  Please contact Palliative Medicine Team phone at [phone] for questions and concerns.

## 2016-01-25 NOTE — Progress Notes (Signed)
Noted screaming and cursing coming from room 1603, upon entering the room one daughter Shari Mcmahon) is yelling and cursing the other Daughter.  Ask daughter to stop yelling, take it outside the room [facility] she isnt leaving the room. The other daughter Shari Mcmahon) states " I am the trigger for her" and i will leave. Security and [employer] here ask willie mae to leave as well.  POA Daughter leaves and security leaves as well.  Bethann Punches RN

## 2016-01-26 ENCOUNTER — Telehealth: Payer: Self-pay | Admitting: Internal Medicine

## 2016-01-26 LAB — CBC
HEMATOCRIT: 22.5 % — AB (ref 36.0–46.0)
HEMOGLOBIN: 7.6 g/dL — AB (ref 12.0–15.0)
MCH: 32.2 pg (ref 26.0–34.0)
MCHC: 33.8 g/dL (ref 30.0–36.0)
MCV: 95.3 fL (ref 78.0–100.0)
Platelets: 239 10*3/uL (ref 150–400)
RBC: 2.36 MIL/uL — AB (ref 3.87–5.11)
RDW: 19.1 % — AB (ref 11.5–15.5)
WBC: 12.9 10*3/uL — AB (ref 4.0–10.5)

## 2016-01-26 LAB — GLUCOSE, CAPILLARY
GLUCOSE-CAPILLARY: 102 mg/dL — AB (ref 65–99)
Glucose-Capillary: 128 mg/dL — ABNORMAL HIGH (ref 65–99)

## 2016-01-26 LAB — BASIC METABOLIC PANEL
ANION GAP: 7 (ref 5–15)
BUN: 56 mg/dL — ABNORMAL HIGH (ref 6–20)
CALCIUM: 9.7 mg/dL (ref 8.9–10.3)
CHLORIDE: 109 mmol/L (ref 101–111)
CO2: 22 mmol/L (ref 22–32)
Creatinine, Ser: 1.8 mg/dL — ABNORMAL HIGH (ref 0.44–1.00)
GFR calc non Af Amer: 24 mL/min — ABNORMAL LOW (ref >60)
GFR, EST AFRICAN AMERICAN: 28 mL/min — AB (ref >60)
GLUCOSE: 121 mg/dL — AB (ref 65–99)
POTASSIUM: 4.2 mmol/L (ref 3.5–5.1)
Sodium: 138 mmol/L (ref 135–145)

## 2016-01-26 LAB — CULTURE, BLOOD (ROUTINE X 2)
CULTURE: NO GROWTH
Culture: NO GROWTH

## 2016-01-26 MED ORDER — LEVOFLOXACIN 500 MG PO TABS
500.0000 mg | ORAL_TABLET | ORAL | Status: AC
  Administered 2016-01-26: 500 mg via ORAL
  Filled 2016-01-26: qty 1

## 2016-01-26 NOTE — Telephone Encounter (Signed)
Unfortunately I am unable to accept at this time due to patient roster too full

## 2016-01-26 NOTE — Progress Notes (Addendum)
PROGRESS NOTE    Shari Mcmahon  [id number]  DOB: [birth year]  DOA: 01/21/2016 PCP: Gildardo Cranker, DO Outpatient Specialists: Vascular surgeon: Dr. Al Decant course: 82 y.o. female with PMH of dementia, severe protein calorie malnutrition, dysphagia, recurrent UTIs, type 2 diabetes,  peripheral vascular disease (not a candidate for revascularization) with osteomyelitis of the left calcaneus treated with IV antibiotics via a PICC line (which was apparently placed on or about 01/04/16 according to progress notes, and received 14 days of IV cefepime), chronic pain related to arthritis/gout, HLD, HTN, who was brought to the hospital on 3/23 for evaluation from her SNF secondary to lethargy and changes in her mental status. She was admitted for aspiration versus HCAP. Palliative care consulted for goals of care-continued medical treatment. Acute kidney injury-nephrology consulted. Capacity issues-psychiatric consulted and patient lacks capacity to make medical decisions. Creatinine has plateaued. Improved from respiratory standpoint. DC home with youngest daughter, possibly in the next 1-2 days.   Assessment & Plan:    Aspiration pneumonia versus HCAP (healthcare-associated pneumonia)In a patient with dysphagia and Alzheimer's dementia - Chest x-ray showed right lung base infiltrate. Patient with known history of dysphagia. - Likely an aspiration pneumonia in a patient at risk for resistant organisms. As per daughter, patient has had prior episodes of aspiration. - Blood cultures 2: Negative. Urine culture-VRE (only 20,000 colonies-likely a contaminant or a colonizer). MRSA PCR: Negative. Flu panel PCR: Negative. Urinary streptococcal antigen and sputum cultures have not been sent. - Patient has are ready received Tamiflu as an outpatient, so would hold further Tamiflu for now. - Treated empirically with cefepime and vancomycin to cover resistant organisms. DC'ed  vancomycin. Patient has completed 5 days of IV cefepime. Changed to oral levofloxacin and complete total 7 days treatment. - Speech therapy input appreciated and recommend regular diet, thin liquids and aspiration precautions. - Patient is at high risk for recurrent aspiration related to dementia and mental status changes. This has been repeatedly discussed with patient's 2 daughters and they verbalized understanding.  Acute kidney injury - Baseline creatinine not clear. Possibly due to poor oral intake.  - Despite IV fluids, creatinine had progressively increased to 1.8 on 3/26. Renal ultrasound without hydronephrosis.  - IV fluids were changed from normal saline to D5 on 3/26 secondary to hypernatremia. Creatinine has plateaued in the 1.8 range. - Nephrology consulted. Input appreciated. Checking further labs (SPEP/UPEP, FeNA). They do not recommend aggressive therapy given her multiple, irreversible comorbidities and poor functional status. Also indicate poor overall prognosis-agree. - Patient is getting volume overloaded. Discussed with Dr. Marval Regal and agreed to discontinue IV fluids. Follow creatinine in a.m. Not candidate for dialysis. Discussed with HCPOA.  Chronic anemia  - Baseline hemoglobin probably in the 8 g per DL range. Hemoglobin holding steady in the mid 7 g per DL range. No reported bleeding. Follow CBCs and transfuse if hemoglobin <7 g per DL.    Acute encephalopathy complicating underlying chronic mental status changes related to dementia. - Secondary to acute illness complicating underlying dementia.  - Mental status waxing and waning which may be related to acute kidney injury, pneumonia and medications (Depakote) complicating underlying dementia. - CT head without acute findings.  - Treat underlying cause. DC'ed Depakote 3/27 as per psychiatry recommendations and follow. Patient seems to be more alert this morning.  Multiple pressure injuries - Evaluation and management  as per wound care team evaluation on 01/21/16.   Essential hypertension - Not currently on antihypertensives, and  blood pressure controlled.   Alzheimer's dementia - Continue Aricept, Remeron. As per psychiatry, no clear indication for Depakote and hence discontinued. Discussed with HCPOA who indicated that Depakote had been started when she first went into the SNF due to issues with agitation and behavioral abnormalities. Advised her that currently this is being held secondary to sedation and will have to be reassessed in the future if she has issues with agitation or behavioral changes.   Diabetes mellitus, type 2 (HCC) - Insulin sensitive SSI ordered. Controlled.   Peripheral autonomic neuropathy due to diabetes mellitus ([place]) - Unfortunately, has not tolerated Neurontin due to excessive lethargy.   Type II diabetes mellitus with peripheral circulatory disorder (HCC) - Not a candidate for revascularization.   Pressure ulcer/ Ulcer of left heel and midfoot with necrosis of muscle ([place]) - Wound care consultation appreciated.   Dysphagia - Diet per speech therapy recommendations.   Severe protein calorie malnutrition - Secondary to advanced age, dementia and poor oral intake.  Chronic pain -Related to diffuse arthritis, gout, peripheral neuropathy. As per daughter, patient also with pain with minimal movement of any part of her body. This is one reason why she has not participated in therapies in the past.  Adult failure to thrive - Patient used to be fully functional, driving and helping out other people until fall in May 2016. Since then she has not ambulated and mostly bedbound or moves around with the assistance of a wheelchair. Progressive steady decline. Poor overall long-term prognosis given advanced age, frail physical status, dementia, poor nutrition and concern for recurrent aspiration related to mental status, nonhealing and development of new pressure sores due to  nonambulatory status. Discussed at length with patient's daughter/healthcare power of attorney who is an Therapist, sports with [place] [employer]. She is agreeable to palliative care consultation for goals of care.    - Palliative care input appreciated. Apparent disagreement in family. DO NOT RESUSCITATE confirmed. Youngest daughter wishes to take patient home with his other 3 siblings would like her to go to SNF with palliative care and eventually hospice. - As per psychiatry input, patient does not have capacity to make medical decisions and hence decision making will be deferred to patient's healthcare power of attorney Ms. Camille Bal. Unfortunately there seems to be disagreement between patient's youngest daughter Ms. Leodis Liverpool and healthcare power of attorney and other siblings. - At this point, social worker reports that HCPOA has deferred discharge disposition to her youngest sister and is agreeable to allow patient to be discharged home with Ms. Hulda Marin.    DVT prophylaxis: Lovenox Code Status: DO NOT RESUSCITATE  Family Communication: Discussed with patient's youngest daughter Ms. Leodis Liverpool at bedside on 3/28. Discussed at length with Ms. Murphy/HCPOA on 3/28. Updated care and answered questions. Disposition Plan: DC to home possibly in the next 1-2 days.   Consultants:  Palliative care team   Psychiatry  Nephrology  Procedures:  None   Antimicrobials:  Acyclovir 3/231 dose   IV cefepime 3/23 > 3/28  Metronidazole 3/221 dose  IV vancomycin 3/22 > 3/24  Oral levofloxacin 3/28 >  Subjective: Alert and being fed breakfast by her daughter this morning. Denies complaints. "I'm okay".  Objective: Filed Vitals:   01/25/16 0525 01/25/16 2159 01/26/16 0430 01/26/16 1444  BP: 147/56 179/56 131/49 168/63  Pulse: 66 68 70 76  Temp: 98.8 F (37.1 C) 98.8 F (37.1 C) 98.2 F (36.8 C) 98.5 F (36.9 C)  TempSrc: Axillary Oral  Oral Axillary  Resp: 17 17 20  16   Height:      Weight:      SpO2: 99% 98% 98% 99%    Intake/Output Summary (Last 24 hours) at 01/26/16 1638 Last data filed at 01/26/16 1507  Gross per 24 hour  Intake 2616.67 ml  Output      0 ml  Net 2616.67 ml   Filed Weights   01/21/16 0817 01/21/16 1129  Weight: 62.596 kg (138 lb) 62.596 kg (138 lb)    Exam:  General exam: Small built, frail, chronically ill-looking elderly female, looks better than she did over the last couple of days, propped up in bed eating breakfast being fed by her youngest daughter.  Respiratory system: Diminished breath sounds in the bases. Rest of lung fields clear to auscultation. No increased work of breathing. Cardiovascular system: S1 & S2 heard, RRR. No JVD, murmurs, gallops, clicks or pedal edema. Gastrointestinal system: Abdomen is nondistended, soft and nontender. Normal bowel sounds heard. Central nervous system: Alert and oriented only to self. No focal neurological deficits. Extremities: No acute findings. Chronic arthritic changes of both hands and fingers without acute findings. Developing bilateral forearm/hand edema.   Data Reviewed: Basic Metabolic Panel:  Recent Labs Lab 01/22/16 0320 01/23/16 0500 01/24/16 0855 01/25/16 0430 01/26/16 0435  NA 142 142 147* 143 138  K 4.3 4.3 4.4 4.5 4.2  CL 112* 112* 119* 114* 109  CO2 24 22 22 23 22   GLUCOSE 62* 91 82 116* 121*  BUN 36* 40* 49* 56* 56*  CREATININE 1.51* 1.76* 1.88* 1.86* 1.80*  CALCIUM 9.3 9.4 9.7 9.5 9.7   Liver Function Tests:  Recent Labs Lab 01/21/16 0430  AST 20  ALT 9*  ALKPHOS 60  BILITOT 0.3  PROT 6.1*  ALBUMIN 2.0*   No results for input(s): LIPASE, AMYLASE in the last 168 hours. No results for input(s): AMMONIA in the last 168 hours. CBC:  Recent Labs Lab 01/21/16 0212 01/21/16 0226 01/22/16 0320 01/23/16 0500 01/24/16 0855 01/26/16 0435  WBC 23.6*  --  20.4* 13.6* 12.9* 12.9*  NEUTROABS 18.9*  --   --   --   --   --   HGB 8.7* 10.9*  7.4* 7.5* 7.6* 7.6*  HCT 26.6* 32.0* 22.5* 22.7* 23.0* 22.5*  MCV 98.2  --  96.6 95.4 95.8 95.3  PLT 364  --  258 266 258 239   Cardiac Enzymes: No results for input(s): CKTOTAL, CKMB, CKMBINDEX, TROPONINI in the last 168 hours. BNP (last 3 results) No results for input(s): PROBNP in the last 8760 hours. CBG:  Recent Labs Lab 01/25/16 0822 01/25/16 1149 01/25/16 1725 01/26/16 0034 01/26/16 0733  GLUCAP 98 110* 101* 128* 102*    Recent Results (from the past 240 hour(s))  Culture, blood (Routine X 2) w Reflex to ID Panel     Status: None   Collection Time: 01/21/16  4:30 AM  Result Value Ref Range Status   Specimen Description BLOOD BLOOD RIGHT HAND  Final   Special Requests BOTTLES DRAWN AEROBIC AND ANAEROBIC [place]  Final   Culture   Final    NO GROWTH 5 DAYS Performed at [facility]    Report Status 01/26/2016 FINAL  Final  Culture, blood (Routine X 2) w Reflex to ID Panel     Status: None   Collection Time: 01/21/16  4:30 AM  Result Value Ref Range Status   Specimen Description BLOOD BLOOD LEFT HAND  Final  Special Requests BOTTLES DRAWN AEROBIC AND ANAEROBIC 6CC  Final   Culture   Final    NO GROWTH 5 DAYS Performed at [facility]    Report Status 01/26/2016 FINAL  Final  Urine culture     Status: None   Collection Time: 01/21/16  8:21 AM  Result Value Ref Range Status   Specimen Description URINE, CATHETERIZED  Final   Special Requests NONE  Final   Culture   Final    20,000 COLONIES/mL VANCOMYCIN RESISTANT ENTEROCOCCUS Performed at [facility]    Report Status 01/23/2016 FINAL  Final   Organism ID, Bacteria VANCOMYCIN RESISTANT ENTEROCOCCUS  Final      Susceptibility   Vancomycin resistant enterococcus - MIC*    AMPICILLIN >=32 RESISTANT Resistant     LEVOFLOXACIN >=8 RESISTANT Resistant     NITROFURANTOIN 256 RESISTANT Resistant     VANCOMYCIN >=32 RESISTANT Resistant     LINEZOLID 2 SENSITIVE Sensitive     * 20,000 COLONIES/mL  VANCOMYCIN RESISTANT ENTEROCOCCUS  MRSA PCR Screening     Status: None   Collection Time: 01/21/16 12:45 PM  Result Value Ref Range Status   MRSA by PCR NEGATIVE NEGATIVE Final    Comment:        The GeneXpert MRSA Assay (FDA approved for NASAL specimens only), is one component of a comprehensive MRSA colonization surveillance program. It is not intended to diagnose MRSA infection nor to guide or monitor treatment for MRSA infections.          Studies: No results found.      Scheduled Meds: . ceFEPime (MAXIPIME) IV  1 g Intravenous Q24H  . donepezil  10 mg Oral QHS  . enoxaparin (LOVENOX) injection  30 mg Subcutaneous Q24H  . febuxostat  40 mg Oral Daily  . feeding supplement (ENSURE ENLIVE)  237 mL Oral BID BM  . feeding supplement (PRO-STAT SUGAR FREE 64)  30 mL Oral TID WC  . lactose free nutrition  237 mL Oral TID BM  . latanoprost  1 drop Both Eyes QHS  . magnesium hydroxide  30 mL Oral Daily  . memantine  28 mg Oral Daily  . mirtazapine  7.5 mg Oral QHS  . multivitamin with minerals  1 tablet Oral Daily  . pantoprazole  40 mg Oral Daily  . senna  1 tablet Oral BID  . sodium chloride flush  10-40 mL Intracatheter Q12H  . vitamin C  500 mg Oral BID   Continuous Infusions:    Principal Problem:   Alzheimer's dementia Active Problems:   Essential hypertension   Dysphagia   Diabetes mellitus, type 2 (HCC)   Peripheral autonomic neuropathy due to diabetes mellitus (HCC)   Type II diabetes mellitus with peripheral circulatory disorder (HCC)   Pressure ulcer   Ulcer of left heel and midfoot with necrosis of muscle (HCC)   HCAP (healthcare-associated pneumonia)   Encounter for palliative care   Goals of care, counseling/discussion   Aspiration pneumonia (HCC)   AKI (acute kidney injury) ([place])   Hypernatremia    Time spent: 25 minutes.    Vernell Leep, MD, FACP, FHM. [facility] Pager [phone]  If 7PM-7AM, please contact  night-coverage [email or web address] Password [facility] 01/26/2016, 4:38 PM    LOS: 5 days

## 2016-01-26 NOTE — Progress Notes (Signed)
Pt's daughter, [name], has faxed over [place]. Form is in pt's chart.  Werner Lean LCSW [phone]

## 2016-01-26 NOTE — Progress Notes (Signed)
Brief Pharmacy note: Pharmacy consulted to dose po levaquin for 2 days  Assessment and Plan:  CrCl ~ 20.57ml  Give levaquin 500mg  PO x 1 today, will cover for the next 48 hours  Dolly Rias RPh 01/26/2016, 4:58 PM Pager [phone]

## 2016-01-26 NOTE — Progress Notes (Signed)
NCM received call from dtr, [facility] requesting wheelchair with cushion and 3n1 bedside commode. NCM will continue to follow for dc needs. Jonnie Finner RN CCM Case Mgmt phone [phone]

## 2016-01-26 NOTE — Telephone Encounter (Signed)
Patient's HCPOA has changed. She is requesting to become a patient under your care once again. Is this something that you are ok with?

## 2016-01-26 NOTE — Progress Notes (Signed)
CM called daughter of pt, Shari Mcmahon [phone] who confirms CSW note that sister, has relinquished care of pt  to [facility] who wants to "bring her home" tomorrow with [place] home health NOT hospice care.  [name] of [place] has worked, Manufacturing engineer, with [employer] to arrange for bed and hoyer lift deliver to the home of [place].  [name] states and this CM confirms Shari Mcmahon has instructed [place] to bring the wheelchair, cushion and 3n1 to room.  No other CM needs were communicated.

## 2016-01-26 NOTE — Progress Notes (Signed)
Patient ID: Shari Mcmahon, female   DOB: Jan 07, 1927, 82 y.o.   MRN: [id number] S:More lethargic today.  Not eating or drinking O:BP 131/49 mmHg  Pulse 70  Temp(Src) 98.2 F (36.8 C) (Oral)  Resp 20  Ht 5\' 6"  (1.676 m)  Wt 62.596 kg (138 lb)  BMI 22.28 kg/m2  SpO2 98%  Intake/Output Summary (Last 24 hours) at 01/26/16 0747 Last data filed at 01/26/16 0552  Gross per 24 hour  Intake 2486.67 ml  Output      0 ml  Net 2486.67 ml   Intake/Output: I/O last 3 completed shifts: In: 3526.7 [P.O.:600; I.V.:2926.7] Out: -   Intake/Output this shift:    Weight change:  [id number], elderly AAF lethargic but arousable CVS:RRR II/VI SEM at LUSB Resp:cta [id number] Ext: minimal edema   Recent Labs Lab 01/21/16 0226 01/21/16 0430 01/21/16 0434 01/22/16 0320 01/23/16 0500 01/24/16 0855 01/25/16 0430 01/26/16 0435  NA 142  --   --  142 142 147* 143 138  K 4.3  --   --  4.3 4.3 4.4 4.5 4.2  CL 106  --   --  112* 112* 119* 114* 109  CO2  --   --   --  24 22 22 23 22   GLUCOSE 138*  --   --  62* 91 82 116* 121*  BUN 45*  --   --  36* 40* 49* 56* 56*  CREATININE 1.30*  --  1.35* 1.51* 1.76* 1.88* 1.86* 1.80*  ALBUMIN  --  2.0*  --   --   --   --   --   --   CALCIUM  --   --   --  9.3 9.4 9.7 9.5 9.7  AST  --  20  --   --   --   --   --   --   ALT  --  9*  --   --   --   --   --   --    Liver Function Tests:  Recent Labs Lab 01/21/16 0430  AST 20  ALT 9*  ALKPHOS 60  BILITOT 0.3  PROT 6.1*  ALBUMIN 2.0*   No results for input(s): LIPASE, AMYLASE in the last 168 hours. No results for input(s): AMMONIA in the last 168 hours. CBC:  Recent Labs Lab 01/21/16 0212  01/22/16 0320 01/23/16 0500 01/24/16 0855 01/26/16 0435  WBC 23.6*  --  20.4* 13.6* 12.9* 12.9*  NEUTROABS 18.9*  --   --   --   --   --   HGB 8.7*  < > 7.4* 7.5* 7.6* 7.6*  HCT 26.6*  < > 22.5* 22.7* 23.0* 22.5*  MCV 98.2  --  96.6 95.4 95.8 95.3  PLT 364  --  258 266 258 239  < > = values in this  interval not displayed. Cardiac Enzymes: No results for input(s): CKTOTAL, CKMB, CKMBINDEX, TROPONINI in the last 168 hours. CBG:  Recent Labs Lab 01/24/16 2216 01/25/16 0822 01/25/16 1149 01/25/16 1725 01/26/16 0034  GLUCAP 127* 98 110* 101* 128*    Iron Studies: No results for input(s): IRON, TIBC, TRANSFERRIN, FERRITIN in the last 72 hours. Studies/Results: No results found. Marland Kitchen ceFEPime (MAXIPIME) IV  1 g Intravenous Q24H  . donepezil  10 mg Oral QHS  . enoxaparin (LOVENOX) injection  30 mg Subcutaneous Q24H  . febuxostat  40 mg Oral Daily  . feeding supplement (ENSURE ENLIVE)  237 mL Oral BID BM  . feeding  supplement (PRO-STAT SUGAR FREE 64)  30 mL Oral TID WC  . lactose free nutrition  237 mL Oral TID BM  . latanoprost  1 drop Both Eyes QHS  . magnesium hydroxide  30 mL Oral Daily  . memantine  28 mg Oral Daily  . mirtazapine  7.5 mg Oral QHS  . multivitamin with minerals  1 tablet Oral Daily  . pantoprazole  40 mg Oral Daily  . senna  1 tablet Oral BID  . sodium chloride flush  10-40 mL Intracatheter Q12H  . vitamin C  500 mg Oral BID    BMET    Component Value Date/Time   NA 138 01/26/2016 0435   K 4.2 01/26/2016 0435   CL 109 01/26/2016 0435   CO2 22 01/26/2016 0435   GLUCOSE 121* 01/26/2016 0435   BUN 56* 01/26/2016 0435   CREATININE 1.80* 01/26/2016 0435   CALCIUM 9.7 01/26/2016 0435   GFRNONAA 24* 01/26/2016 0435   GFRAA 28* 01/26/2016 0435   CBC    Component Value Date/Time   WBC 12.9* 01/26/2016 0435   WBC 6.2 06/17/2008 0904   RBC 2.36* 01/26/2016 0435   RBC 3.53* 06/17/2008 0904   HGB 7.6* 01/26/2016 0435   HGB 12.1 06/17/2008 0904   HCT 22.5* 01/26/2016 0435   HCT 36.3 06/17/2008 0904   PLT 239 01/26/2016 0435   PLT 126 Occas Small Plt Clump* 06/17/2008 0904   MCV 95.3 01/26/2016 0435   MCV 102.7* 06/17/2008 0904   MCH 32.2 01/26/2016 0435   MCH 34.3* 06/17/2008 0904   MCHC 33.8 01/26/2016 0435   MCHC 33.4 06/17/2008 0904   RDW 19.1*  01/26/2016 0435   RDW 14.3 06/17/2008 0904   LYMPHSABS 2.7 01/21/2016 0212   LYMPHSABS 1.6 06/17/2008 0904   MONOABS 1.8* 01/21/2016 0212   MONOABS 1.0* 06/17/2008 0904   EOSABS 0.2 01/21/2016 0212   EOSABS 0.1 06/17/2008 0904   BASOSABS 0.0 01/21/2016 0212   BASOSABS 0.1 06/17/2008 0904     Assessment/Plan: 1. AKI- in setting of acute illness and decreased po intake. Will check FeNa and SPEP/UPEP and follow UOP and Scr.  1. Unfortunately pt is incontinent so not able to follow UOP accurately nor have they obtained urine sodium/creatinine yet.  2. Would not recommend aggressive therapy given her multiple, irreversible co-morbidities and poor functional status.  3. Not able to keep up po intake.  Concerned that this will be a recurring issue towards the end and agree with Palliative/Hospice care 4. Continue with IVF's for now. 2. HCAP vs aspiration pneumonia- per primary 3. Anemia, normocytic- will check SPEP/UPEP 4. DM- per primary  5. PVD- inoperable 6. Dementia- not able to make decisions for herself per Psych. 7. Pressure ulcers 8. HTN- stable 9. Dispo- overall prognosis poor. Agree with DNR and appreciate Palliative Care input.  Agree with hospice once discharged especially if she will be going to her daughters home.  [place]

## 2016-01-27 DIAGNOSIS — I10 Essential (primary) hypertension: Secondary | ICD-10-CM

## 2016-01-27 DIAGNOSIS — J189 Pneumonia, unspecified organism: Secondary | ICD-10-CM

## 2016-01-27 DIAGNOSIS — E1151 Type 2 diabetes mellitus with diabetic peripheral angiopathy without gangrene: Secondary | ICD-10-CM

## 2016-01-27 DIAGNOSIS — R1310 Dysphagia, unspecified: Secondary | ICD-10-CM

## 2016-01-27 DIAGNOSIS — L8990 Pressure ulcer of unspecified site, unspecified stage: Secondary | ICD-10-CM

## 2016-01-27 DIAGNOSIS — A419 Sepsis, unspecified organism: Secondary | ICD-10-CM

## 2016-01-27 DIAGNOSIS — J690 Pneumonitis due to inhalation of food and vomit: Principal | ICD-10-CM

## 2016-01-27 DIAGNOSIS — G309 Alzheimer's disease, unspecified: Secondary | ICD-10-CM

## 2016-01-27 DIAGNOSIS — Z7189 Other specified counseling: Secondary | ICD-10-CM

## 2016-01-27 DIAGNOSIS — N179 Acute kidney failure, unspecified: Secondary | ICD-10-CM

## 2016-01-27 DIAGNOSIS — F0280 Dementia in other diseases classified elsewhere without behavioral disturbance: Secondary | ICD-10-CM

## 2016-01-27 DIAGNOSIS — E870 Hyperosmolality and hypernatremia: Secondary | ICD-10-CM

## 2016-01-27 DIAGNOSIS — L97423 Non-pressure chronic ulcer of left heel and midfoot with necrosis of muscle: Secondary | ICD-10-CM

## 2016-01-27 DIAGNOSIS — E1142 Type 2 diabetes mellitus with diabetic polyneuropathy: Secondary | ICD-10-CM

## 2016-01-27 DIAGNOSIS — R531 Weakness: Secondary | ICD-10-CM

## 2016-01-27 DIAGNOSIS — Z515 Encounter for palliative care: Secondary | ICD-10-CM

## 2016-01-27 LAB — RENAL FUNCTION PANEL
Albumin: 1.8 g/dL — ABNORMAL LOW (ref 3.5–5.0)
Anion gap: 7 (ref 5–15)
BUN: 53 mg/dL — AB (ref 6–20)
CALCIUM: 10.2 mg/dL (ref 8.9–10.3)
CHLORIDE: 111 mmol/L (ref 101–111)
CO2: 22 mmol/L (ref 22–32)
CREATININE: 1.71 mg/dL — AB (ref 0.44–1.00)
GFR calc Af Amer: 30 mL/min — ABNORMAL LOW (ref >60)
GFR, EST NON AFRICAN AMERICAN: 26 mL/min — AB (ref >60)
Glucose, Bld: 93 mg/dL (ref 65–99)
Phosphorus: 3.4 mg/dL (ref 2.5–4.6)
Potassium: 4.9 mmol/L (ref 3.5–5.1)
SODIUM: 140 mmol/L (ref 135–145)

## 2016-01-27 LAB — GLUCOSE, CAPILLARY
Glucose-Capillary: 74 mg/dL (ref 65–99)
Glucose-Capillary: 88 mg/dL (ref 65–99)

## 2016-01-27 NOTE — Progress Notes (Signed)
[facility]  Patient Status: New  [facility] is providing the following services: Lightweight wheelchair with seat/back cushions, bedside (3in1) commode delivered to patient's room on 01/27/16. Awaiting signature of HCPOA on paperwork for these items. Paperwork is located in shadow chart.  Semi-electric hospital bed, Alternating pressure pump & pad mattress, and Hoyer lift to be delivered to patient's home tonight between 5-8pm per daughter Hulda Marin.   If patient discharges after hours, please call [phone].   Corliss Parish 01/27/2016, 3:53 PM

## 2016-01-27 NOTE — Progress Notes (Signed)
Patient ID: ALAYAH KITTEL, female   DOB: 1927-07-14, 82 y.o.   MRN: [id number] S:Complaining of pain all over O:BP 142/50 mmHg  Pulse 72  Temp(Src) 97.7 F (36.5 C) (Oral)  Resp 16  Ht 5\' 6"  (1.676 m)  Wt 138 lb (62.596 kg)  BMI 22.28 kg/m2  SpO2 98%  Intake/Output Summary (Last 24 hours) at 01/27/16 1051 Last data filed at 01/27/16 0929  Gross per 24 hour  Intake    430 ml  Output      0 ml  Net    430 ml   Intake/Output: I/O last 3 completed shifts: In: 68 [P.O.:420; I.V.:1210] Out: -   Intake/Output this shift:  Total I/O In: 120 [P.O.:120] Out: -  Weight change:  Gen: frail, cachectic AAF CVS:no rub Resp:decreased inspiratory effort [id number] Ext:no edema   Recent Labs Lab 01/21/16 0226 01/21/16 0430 01/21/16 0434 01/22/16 0320 01/23/16 0500 01/24/16 0855 01/25/16 0430 01/26/16 0435 01/27/16 0530  NA 142  --   --  142 142 147* 143 138 140  K 4.3  --   --  4.3 4.3 4.4 4.5 4.2 4.9  CL 106  --   --  112* 112* 119* 114* 109 111  CO2  --   --   --  24 22 22 23 22 22   GLUCOSE 138*  --   --  62* 91 82 116* 121* 93  BUN 45*  --   --  36* 40* 49* 56* 56* 53*  CREATININE 1.30*  --  1.35* 1.51* 1.76* 1.88* 1.86* 1.80* 1.71*  ALBUMIN  --  2.0*  --   --   --   --   --   --  1.8*  CALCIUM  --   --   --  9.3 9.4 9.7 9.5 9.7 10.2  PHOS  --   --   --   --   --   --   --   --  3.4  AST  --  20  --   --   --   --   --   --   --   ALT  --  9*  --   --   --   --   --   --   --    Liver Function Tests:  Recent Labs Lab 01/21/16 0430 01/27/16 0530  AST 20  --   ALT 9*  --   ALKPHOS 60  --   BILITOT 0.3  --   PROT 6.1*  --   ALBUMIN 2.0* 1.8*   No results for input(s): LIPASE, AMYLASE in the last 168 hours. No results for input(s): AMMONIA in the last 168 hours. CBC:  Recent Labs Lab 01/21/16 0212  01/22/16 0320 01/23/16 0500 01/24/16 0855 01/26/16 0435  WBC 23.6*  --  20.4* 13.6* 12.9* 12.9*  NEUTROABS 18.9*  --   --   --   --   --   HGB 8.7*  < >  7.4* 7.5* 7.6* 7.6*  HCT 26.6*  < > 22.5* 22.7* 23.0* 22.5*  MCV 98.2  --  96.6 95.4 95.8 95.3  PLT 364  --  258 266 258 239  < > = values in this interval not displayed. Cardiac Enzymes: No results for input(s): CKTOTAL, CKMB, CKMBINDEX, TROPONINI in the last 168 hours. CBG:  Recent Labs Lab 01/25/16 1149 01/25/16 1725 01/26/16 0034 01/26/16 0733 01/27/16 0817  GLUCAP 110* 101* 128* 102* 74    Iron Studies:  No results for input(s): IRON, TIBC, TRANSFERRIN, FERRITIN in the last 72 hours. Studies/Results: No results found. . donepezil  10 mg Oral QHS  . enoxaparin (LOVENOX) injection  30 mg Subcutaneous Q24H  . febuxostat  40 mg Oral Daily  . feeding supplement (ENSURE ENLIVE)  237 mL Oral BID BM  . feeding supplement (PRO-STAT SUGAR FREE 64)  30 mL Oral TID WC  . lactose free nutrition  237 mL Oral TID BM  . latanoprost  1 drop Both Eyes QHS  . memantine  28 mg Oral Daily  . mirtazapine  7.5 mg Oral QHS  . multivitamin with minerals  1 tablet Oral Daily  . pantoprazole  40 mg Oral Daily  . senna  1 tablet Oral BID  . sodium chloride flush  10-40 mL Intracatheter Q12H  . vitamin C  500 mg Oral BID    BMET    Component Value Date/Time   NA 140 01/27/2016 0530   K 4.9 01/27/2016 0530   CL 111 01/27/2016 0530   CO2 22 01/27/2016 0530   GLUCOSE 93 01/27/2016 0530   BUN 53* 01/27/2016 0530   CREATININE 1.71* 01/27/2016 0530   CALCIUM 10.2 01/27/2016 0530   GFRNONAA 26* 01/27/2016 0530   GFRAA 30* 01/27/2016 0530   CBC    Component Value Date/Time   WBC 12.9* 01/26/2016 0435   WBC 6.2 06/17/2008 0904   RBC 2.36* 01/26/2016 0435   RBC 3.53* 06/17/2008 0904   HGB 7.6* 01/26/2016 0435   HGB 12.1 06/17/2008 0904   HCT 22.5* 01/26/2016 0435   HCT 36.3 06/17/2008 0904   PLT 239 01/26/2016 0435   PLT 126 Occas Small Plt Clump* 06/17/2008 0904   MCV 95.3 01/26/2016 0435   MCV 102.7* 06/17/2008 0904   MCH 32.2 01/26/2016 0435   MCH 34.3* 06/17/2008 0904   MCHC  33.8 01/26/2016 0435   MCHC 33.4 06/17/2008 0904   RDW 19.1* 01/26/2016 0435   RDW 14.3 06/17/2008 0904   LYMPHSABS 2.7 01/21/2016 0212   LYMPHSABS 1.6 06/17/2008 0904   MONOABS 1.8* 01/21/2016 0212   MONOABS 1.0* 06/17/2008 0904   EOSABS 0.2 01/21/2016 0212   EOSABS 0.1 06/17/2008 0904   BASOSABS 0.0 01/21/2016 0212   BASOSABS 0.1 06/17/2008 0904     Assessment/Plan: 1. AKI- in setting of acute illness and decreased po intake. Will check FeNa and SPEP/UPEP and follow UOP and Scr.  1. Unfortunately pt is incontinent so not able to follow UOP accurately nor have they obtained urine sodium/creatinine yet.  2. Would not recommend aggressive therapy given her multiple, irreversible co-morbidities and poor functional status.  3. Not able to keep up po intake. Concerned that this will be a recurring issue towards the end and agree with Palliative/Hospice care 4. Continue with IVF's for now. 5. Scr continues to improve.  Will sign off as nothing further to add.  She will not need outpatient follow up as her care would be better suited with hospice.  Avoid nephrotoxic agents and hospice as outpatient. 2. HCAP vs aspiration pneumonia- per primary 3. Anemia, normocytic- will check SPEP/UPEP 4. DM- per primary  5. PVD- inoperable 6. Dementia- not able to make decisions for herself per Psych. 7. Pressure ulcers 8. HTN- stable 9. Dispo- overall prognosis poor. Agree with DNR and appreciate Palliative Care input. Agree with hospice once discharged especially if she will be going to her daughters home [facility]

## 2016-01-27 NOTE — Consult Note (Signed)
Shari Mcmahon attempted to visit; per RN complicated situation; pt unavailable and no family present; pt may be going home within next 24 hours. 3:38 PM Gwynn Burly

## 2016-01-27 NOTE — Progress Notes (Signed)
Daily Progress Note   Patient Name: Shari Mcmahon       Date: 01/27/2016 DOB: Apr 29, 1927  Age: 82 y.o. MRN#: [id number] Attending Physician: Cristal Ford, DO Primary Care Physician: Gildardo Cranker, DO Admit Date: 01/21/2016  Reason for Consultation/Follow-up: Establishing goals of care  Subjective:  resting comfortably, no family at bedside.   Calls placed to both daughters, at request of Dr Berton Bon hoping to clarify [place] once discharged.   Lyman Bishop Mae to call me back this afternoon when she can talk.  We did briefly discuss home hospice services   [name] and I discussed the plan of care.  Focus is comfort and dignity.  [name] shares the difficult family dynamics and she tells me to "keep the peace" she has agreed to allow her sister/Wilia Mae to take the patient home.  [name] remains the HPOA  Please note:  There is a second HPOA documented and scanned into computer, and dated today which I believe cannot be valid knowing the patient does not have capacity      Length of Stay: 6 days  Current Medications: Scheduled Meds:  . donepezil  10 mg Oral QHS  . enoxaparin (LOVENOX) injection  30 mg Subcutaneous Q24H  . febuxostat  40 mg Oral Daily  . feeding supplement (ENSURE ENLIVE)  237 mL Oral BID BM  . feeding supplement (PRO-STAT SUGAR FREE 64)  30 mL Oral TID WC  . lactose free nutrition  237 mL Oral TID BM  . latanoprost  1 drop Both Eyes QHS  . memantine  28 mg Oral Daily  . mirtazapine  7.5 mg Oral QHS  . multivitamin with minerals  1 tablet Oral Daily  . pantoprazole  40 mg Oral Daily  . senna  1 tablet Oral BID  . sodium chloride flush  10-40 mL Intracatheter Q12H  . vitamin C  500 mg Oral BID    Continuous Infusions:    PRN Meds: acetaminophen **OR**  acetaminophen, HYDROcodone-acetaminophen, ondansetron **OR** ondansetron (ZOFRAN) IV, sodium chloride flush  Physical Exam: Physical Exam  Constitutional: She appears lethargic. She appears ill.  Cardiovascular: Normal rate, regular rhythm and normal heart sounds.   Pulmonary/Chest: She has decreased breath sounds in the right lower field and the left lower field.  Neurological: She appears lethargic.  Frail weak elderly lady Patient opens eyes, is able to answer few questions appropriately S1 S2 Clear shallow Abdomen soft No edema LE non healing wounds   Vital Signs: BP 151/60 mmHg  Pulse 80  Temp(Src) 98.6 F (37 C) (Oral)  Resp 16  Ht 5\' 6"  (1.676 m)  Wt 62.596 kg (138 lb)  BMI 22.28 kg/m2  SpO2 100% SpO2: SpO2: 100 % O2 Device: O2 Device: Nasal Cannula O2 Flow Rate: O2 Flow Rate (L/min): 2 L/min  Intake/output summary:   Intake/Output Summary (Last 24 hours) at 01/27/16 1522 Last data filed at 01/27/16 1425  Gross per 24 hour  Intake    420 ml  Output      0 ml  Net    420 ml   LBM: Last BM Date: 01/26/16 Baseline Weight: Weight: 62.596 kg (138 lb) (per daughter) Most recent weight: Weight: 62.596 kg (138 lb)       Palliative Assessment/Data: Flowsheet Rows        Most Recent Value   Intake Tab    Referral Department  Hospitalist   Unit at Time of Referral  Med/Surg Unit   Palliative Care Primary Diagnosis  Neurology   Date Notified  01/22/16   Palliative Care Type  Return patient Palliative Care   Reason for referral  Clarify Goals of Care   Date of Admission  01/21/16   Date first seen by Palliative Care  01/22/16   # of days Palliative referral response time  0 Day(s)   # of days IP prior to Palliative referral  1   Clinical Assessment    Palliative Performance Scale Score  30%   Pain Max last 24 hours  4   Pain Min Last 24 hours  3   Dyspnea Max Last 24 Hours  3   Dyspnea Min Last 24 hours  2   Psychosocial & Spiritual Assessment      Palliative Care Outcomes    Patient/Family meeting held?  Yes   Who was at the meeting?  dtr jackie hcpoa over the phone.    Palliative Care follow-up planned  Yes, Facility      Additional Data Reviewed: CBC    Component Value Date/Time   WBC 12.9* 01/26/2016 0435   WBC 6.2 06/17/2008 0904   RBC 2.36* 01/26/2016 0435   RBC 3.53* 06/17/2008 0904   HGB 7.6* 01/26/2016 0435   HGB 12.1 06/17/2008 0904   HCT 22.5* 01/26/2016 0435   HCT 36.3 06/17/2008 0904   PLT 239 01/26/2016 0435   PLT 126 Occas Small Plt Clump* 06/17/2008 0904   MCV 95.3 01/26/2016 0435   MCV 102.7* 06/17/2008 0904   MCH 32.2 01/26/2016 0435   MCH 34.3* 06/17/2008 0904   MCHC 33.8 01/26/2016 0435   MCHC 33.4 06/17/2008 0904   RDW 19.1* 01/26/2016 0435   RDW 14.3 06/17/2008 0904   LYMPHSABS 2.7 01/21/2016 0212   LYMPHSABS 1.6 06/17/2008 0904   MONOABS 1.8* 01/21/2016 0212   MONOABS 1.0* 06/17/2008 0904   EOSABS 0.2 01/21/2016 0212   EOSABS 0.1 06/17/2008 0904   BASOSABS 0.0 01/21/2016 0212   BASOSABS 0.1 06/17/2008 0904    CMP     Component Value Date/Time   NA 140 01/27/2016 0530   K 4.9 01/27/2016 0530   CL 111 01/27/2016 0530   CO2 22 01/27/2016 0530   GLUCOSE 93 01/27/2016 0530   BUN 53* 01/27/2016 0530   CREATININE 1.71* 01/27/2016 0530   CALCIUM 10.2 01/27/2016 0530  PROT 6.1* 01/21/2016 0430   ALBUMIN 1.8* 01/27/2016 0530   AST 20 01/21/2016 0430   ALT 9* 01/21/2016 0430   ALKPHOS 60 01/21/2016 0430   BILITOT 0.3 01/21/2016 0430   GFRNONAA 26* 01/27/2016 0530   GFRAA 30* 01/27/2016 0530       Problem List:  Patient Active Problem List   Diagnosis Date Noted  . Hypernatremia   . Goals of care, counseling/discussion   . Aspiration pneumonia ([place])   . AKI (acute kidney injury) ([place])   . Encounter for palliative care   . HCAP (healthcare-associated pneumonia) 01/21/2016  . Ulcer of left heel and midfoot with necrosis of muscle ([place]) 01/17/2016  . Type II diabetes mellitus  with peripheral circulatory disorder ([place]) 01/01/2016  . Pressure ulcer 01/01/2016  . Peripheral autonomic neuropathy due to diabetes mellitus ([place]) 11/15/2015  . GERD without esophagitis 10/30/2015  . Alzheimer's dementia 07/28/2015  . Diabetes mellitus, type 2 ([place]) 07/28/2015  . Fracture of right femur ([place]) 07/28/2015  . Arthritis of knee 05/19/2015  . Anemia 02/25/2015  . Loss of weight 09/17/2014  . Chronic pain syndrome 06/16/2014  . Primary localized osteoarthrosis, lower leg 01/08/2014  . Rotator cuff tear arthropathy of both shoulders 12/09/2013  . CONSTIPATION, CHRONIC 10/15/2010  . Hyperlipidemia 01/27/2010  . Gastroparesis 12/24/2009  . Dysphagia 12/24/2009  . DEPRESSION 06/17/2009  . BRADYCARDIA, CHRONIC 12/17/2008  . PERIPHERAL NEUROPATHY 06/19/2008  . LUNG NODULE 03/03/2008  . GENERALIZED OSTEOARTHROSIS INVOLVING HAND 10/03/2007  . Gout 09/19/2007  . GLAUCOMA 09/19/2007  . Essential hypertension 09/19/2007     Palliative Care Assessment & Plan    1.Code Status:  DNR    Code Status Orders        Start     Ordered   01/21/16 1152  Do not attempt resuscitation (DNR)   Continuous    Question Answer Comment  In the event of cardiac or respiratory ARREST Do not call a "code blue"   In the event of cardiac or respiratory ARREST Do not perform Intubation, CPR, defibrillation or ACLS   In the event of cardiac or respiratory ARREST Use medication by any route, position, wound care, and other measures to relive pain and suffering. May use oxygen, suction and manual treatment of airway obstruction as needed for comfort.      01/21/16 1151    Code Status History    Date Active Date Inactive Code Status Order ID Comments User Context   01/21/2016  9:21 AM 01/21/2016 11:51 AM DNR [id number]  Jola Schmidt, MD ED    Advance Directive Documentation        Most Recent Value   Type of Advance Directive  Out of facility DNR (pink MOST or yellow form)   Pre-existing out  of facility DNR order (yellow form or pink MOST form)  Physician notified to receive inpatient order   "MOST" Form in Place?         2. Goals of Care/Additional Recommendations:   Desire for further Chaplaincy support:no  Psycho-social Needs: Crisis Intervention: inter family conflict amongst patient's 2 daughters regarding discharge/ disposition options. Emotional support offered to both separately   3. Symptom Management:      1. Continue current mode of care.   4. Palliative Prophylaxis:   Delirium Protocol  5. Prognosis: Unable to determine  6. Discharge Planning:  Case manager RN tells me plan is home with home health tomorrow  Care plan was discussed with SW, CM,   Thank you  for allowing the Palliative Medicine Team to assist in the care of this patient.   Time In:  1530 Time Out: 1605 Total Time 35 min    Prolonged Time Billed  no       [phone]   Knox Royalty, NP  01/27/2016, 3:22 PM  Please contact Palliative Medicine Team phone at [phone] for questions and concerns.

## 2016-01-27 NOTE — Progress Notes (Signed)
[facility]                                                                              Patient Demographics  Shari Mcmahon, is a 82 y.o. female, DOB - 1927-02-23, [id number]  Admit date - 01/21/2016   Admitting Physician Venetia Maxon Rama, MD  Outpatient Primary MD for the patient is Gildardo Cranker, DO  LOS - 6   Chief Complaint  Patient presents with  . Altered Mental Status      Interim history 82 y.o. female with PMH of dementia, severe protein calorie malnutrition, dysphagia, recurrent UTIs, type 2 diabetes, peripheral vascular disease (not a candidate for revascularization) with osteomyelitis of the left calcaneus treated with IV antibiotics via a PICC line (which was apparently placed on or about 01/04/16 according to progress notes, and received 14 days of IV cefepime), chronic pain related to arthritis/gout, HLD, HTN, who was brought to the hospital on 3/23 for evaluation from her SNF secondary to lethargy and changes in her mental status. She was admitted for aspiration versus HCAP. Palliative care consulted for goals of care-continued medical treatment. Acute kidney injury-nephrology consulted. Capacity issues-psychiatric consulted and patient lacks capacity to make medical decisions. Creatinine has plateaued. Improved from respiratory standpoint. DC home with youngest daughter, possibly in the next 1-2 days.  Assessment & Plan   Aspiration pneumonia versus HCAP (healthcare-associated pneumonia)In a patient with dysphagia and Alzheimer's dementia -Chest x-ray showed right lung base infiltrate. Patient with known history of dysphagia. -Likely an aspiration pneumonia in a patient at risk for resistant organisms. As per daughter, patient has had prior episodes of aspiration. -Blood cultures negative to date -Urine culture-VRE (only 20,000 colonies-likely a contaminant or a colonizer).  -MRSA PCR: Negative.  -Flu panel PCR: Negative.  -Urinary streptococcal antigen and  sputum cultures have not been sent. -Patient received Tamiflu as an outpatient, no further tamiflu at this time -Treated empirically with cefepime and vancomycin to cover resistant organisms.  -Transitioned to oral levaquin- received one dose to cover for 48hrs (completed antibiotics in the hospital.) -Speech therapy input appreciated and recommend regular diet, thin liquids and aspiration precautions. -Patient is at high risk for recurrent aspiration related to dementia and mental status changes. This has been repeatedly discussed with patient's 2 daughters and they verbalized understanding.  Acute kidney injury -Baseline creatinine not clear. Possibly due to poor oral intake.  -Despite IV fluids, creatinine had progressively increased to 1.8 on 3/26.  -Renal ultrasound without hydronephrosis.  - IV fluids were changed from normal saline to D5 on 3/26 secondary to hypernatremia. -Creatinine today 1.71 -Nephrology consulted and appreciated, Signed off today, recommended continue IVF.  Did not recommend outpatient follow up. Patient is not a candidate for dialysis.   Chronic anemia  -Baseline hemoglobin probably in the 8 g per DL range.  -Hemoglobin holding steady in the mid 7 g per DL range. No reported bleeding. Follow CBCs and transfuse if hemoglobin <7 g per DL.   Acute encephalopathy complicating underlying chronic mental status changes related to dementia. -Secondary to acute illness complicating underlying dementia.  -Mental status waxing and waning which may be related to acute kidney injury, pneumonia and medications (Depakote)  complicating underlying dementia. - T head without acute findings.  - reat underlying cause. DC'ed Depakote 3/27 as per psychiatry recommendations and follow. Patient seems to be more alert this morning.  Multiple pressure injuries -Evaluation and management as per wound care team evaluation on 01/21/16.  Essential hypertension -Not currently on  antihypertensives, and blood pressure controlled.  Alzheimer's dementia -Continue Aricept, Remeron. As per psychiatry, no clear indication for Depakote and hence discontinued. Discussed with HCPOA who indicated that Depakote had been started when she first went into the SNF due to issues with agitation and behavioral abnormalities. Advised her that currently this is being held secondary to sedation and will have to be reassessed in the future if she has issues with agitation or behavioral changes.  Diabetes mellitus, type 2 (HCC) -Insulin sensitive SSI ordered. Controlled.  Peripheral autonomic neuropathy due to diabetes mellitus ([place]) -Unfortunately, has not tolerated Neurontin due to excessive lethargy.  Type II diabetes mellitus with peripheral circulatory disorder (HCC) -Not a candidate for revascularization.  Pressure ulcer/ Ulcer of left heel and midfoot with necrosis of muscle ([place]) -Wound care consultation appreciated.  Dysphagia -Diet per speech therapy recommendations.  Severe protein calorie malnutrition -Secondary to advanced age, dementia and poor oral intake.  Chronic pain -Related to diffuse arthritis, gout, peripheral neuropathy. As per daughter, patient also with pain with minimal movement of any part of her body. This is one reason why she has not participated in therapies in the past.  Adult failure to thrive/Goals of care - Patient used to be fully functional, driving and helping out other people until fall in May 2016. Since then she has not ambulated and mostly bedbound or moves around with the assistance of a wheelchair. Progressive steady decline. Poor overall long-term prognosis given advanced age, frail physical status, dementia, poor nutrition and concern for recurrent aspiration related to mental status, nonhealing and development of new pressure sores due to nonambulatory status. Discussed at length with patient's daughter/healthcare power of attorney who is  an Therapist, sports with [place] [employer]. She is agreeable to palliative care consultation for goals of care.  - Palliative care input appreciated. Apparent disagreement in family. DO NOT RESUSCITATE confirmed. Youngest daughter wishes to take patient home with his other 3 siblings would like her to go to SNF with palliative care and eventually hospice. - As per psychiatry input, patient does not have capacity to make medical decisions and hence decision making will be deferred to patient's healthcare power of attorney Ms. Camille Bal. Unfortunately there seems to be disagreement between patient's youngest daughter Ms. Leodis Liverpool and healthcare power of attorney and other siblings. - At this point, social worker reports that HCPOA has deferred discharge disposition to her youngest sister and is agreeable to allow patient to be discharged home with Ms. Hulda Marin.  Code Status: DNR  Family Communication: Daughter at bedside.  Disposition Plan: Admitted. Likely discharge 01/28/2016. Daughter would like to arrange everything at home before patient is discharged. CM notified.   Time Spent in minutes   30 minutes  Procedures  None  Consults   Palliative care Nephrology Psychiatry   DVT Prophylaxis  Lovenox  Lab Results  Component Value Date   PLT 239 01/26/2016    Medications  Scheduled Meds: . donepezil  10 mg Oral QHS  . enoxaparin (LOVENOX) injection  30 mg Subcutaneous Q24H  . febuxostat  40 mg Oral Daily  . feeding supplement (ENSURE ENLIVE)  237 mL Oral BID BM  . feeding supplement (  PRO-STAT SUGAR FREE 64)  30 mL Oral TID WC  . lactose free nutrition  237 mL Oral TID BM  . latanoprost  1 drop Both Eyes QHS  . memantine  28 mg Oral Daily  . mirtazapine  7.5 mg Oral QHS  . multivitamin with minerals  1 tablet Oral Daily  . pantoprazole  40 mg Oral Daily  . senna  1 tablet Oral BID  . sodium chloride flush  10-40 mL Intracatheter Q12H  . vitamin C  500 mg Oral BID    Continuous Infusions:  PRN Meds:.acetaminophen **OR** acetaminophen, HYDROcodone-acetaminophen, ondansetron **OR** ondansetron (ZOFRAN) IV, sodium chloride flush  Antibiotics    Anti-infectives    Start     Dose/Rate Route Frequency Ordered Stop   01/26/16 1730  levofloxacin (LEVAQUIN) tablet 500 mg     500 mg Oral Every 48 hours 01/26/16 1656 01/26/16 1843   01/22/16 0400  vancomycin (VANCOCIN) IVPB 750 mg/150 ml premix  Status:  Discontinued     750 mg 150 mL/hr over 60 Minutes Intravenous Every 24 hours 01/21/16 1145 01/23/16 1609   01/21/16 1800  ceFEPIme (MAXIPIME) 2 g in dextrose 5 % 50 mL IVPB  Status:  Discontinued     2 g 100 mL/hr over 30 Minutes Intravenous Every 24 hours 01/21/16 0607 01/21/16 1139   01/21/16 1800  ceFEPIme (MAXIPIME) 1 g in dextrose 5 % 50 mL IVPB  Status:  Discontinued     1 g 100 mL/hr over 30 Minutes Intravenous Every 24 hours 01/21/16 1145 01/26/16 1651   01/21/16 0715  acyclovir (ZOVIRAX) 625 mg in dextrose 5 % 100 mL IVPB  Status:  Discontinued     10 mg/kg  62.6 kg 112.5 mL/hr over 60 Minutes Intravenous Every 12 hours 01/21/16 0645 01/21/16 1136   01/21/16 0330  vancomycin (VANCOCIN) IVPB 1000 mg/200 mL premix     1,000 mg 200 mL/hr over 60 Minutes Intravenous  Once 01/21/16 0326 01/21/16 0554   01/21/16 0330  metroNIDAZOLE (FLAGYL) IVPB 500 mg     500 mg 100 mL/hr over 60 Minutes Intravenous  Once 01/21/16 0326 01/21/16 0654      Subjective:   Martina Sinner seen and examined today.  Patient complains of "butt soreness" and does not like sitting up.  Daughter at bedside, feeding patient.  Patient does have dementia.  Objective:   Filed Vitals:   01/26/16 0430 01/26/16 1444 01/26/16 2121 01/27/16 0643  BP: 131/49 168/63 145/46 142/50  Pulse: 70 76 72 72  Temp: 98.2 F (36.8 C) 98.5 F (36.9 C) 98.8 F (37.1 C) 97.7 F (36.5 C)  TempSrc: Oral Axillary Axillary Oral  Resp: 20 16 18 16   Height:      Weight:      SpO2: 98% 99%  98% 98%    Wt Readings from Last 3 Encounters:  01/21/16 62.596 kg (138 lb)  01/14/16 62.596 kg (138 lb)  01/04/16 62.653 kg (138 lb 2 oz)     Intake/Output Summary (Last 24 hours) at 01/27/16 1247 Last data filed at 01/27/16 0929  Gross per 24 hour  Intake    430 ml  Output      0 ml  Net    430 ml    Exam  General: Well developed, frail/Cachectic, NAD  HEENT: NCAT, mucous membranes moist.   Cardiovascular: S1 S2 auscultated, +murmur, RRR  Respiratory: Diminished breath sounds, but clear  Abdomen: Soft, nontender, nondistended, + bowel sounds  Extremities: warm dry without cyanosis  clubbing or edema  Data Review   Micro Results Recent Results (from the past 240 hour(s))  Culture, blood (Routine X 2) w Reflex to ID Panel     Status: None   Collection Time: 01/21/16  4:30 AM  Result Value Ref Range Status   Specimen Description BLOOD BLOOD RIGHT HAND  Final   Special Requests BOTTLES DRAWN AEROBIC AND ANAEROBIC [place]  Final   Culture   Final    NO GROWTH 5 DAYS Performed at [facility]    Report Status 01/26/2016 FINAL  Final  Culture, blood (Routine X 2) w Reflex to ID Panel     Status: None   Collection Time: 01/21/16  4:30 AM  Result Value Ref Range Status   Specimen Description BLOOD BLOOD LEFT HAND  Final   Special Requests BOTTLES DRAWN AEROBIC AND ANAEROBIC [place]  Final   Culture   Final    NO GROWTH 5 DAYS Performed at [facility]    Report Status 01/26/2016 FINAL  Final  Urine culture     Status: None   Collection Time: 01/21/16  8:21 AM  Result Value Ref Range Status   Specimen Description URINE, CATHETERIZED  Final   Special Requests NONE  Final   Culture   Final    20,000 COLONIES/mL VANCOMYCIN RESISTANT ENTEROCOCCUS Performed at [facility]    Report Status 01/23/2016 FINAL  Final   Organism ID, Bacteria VANCOMYCIN RESISTANT ENTEROCOCCUS  Final      Susceptibility   Vancomycin resistant enterococcus - MIC*     AMPICILLIN >=32 RESISTANT Resistant     LEVOFLOXACIN >=8 RESISTANT Resistant     NITROFURANTOIN 256 RESISTANT Resistant     VANCOMYCIN >=32 RESISTANT Resistant     LINEZOLID 2 SENSITIVE Sensitive     * 20,000 COLONIES/mL VANCOMYCIN RESISTANT ENTEROCOCCUS  MRSA PCR Screening     Status: None   Collection Time: 01/21/16 12:45 PM  Result Value Ref Range Status   MRSA by PCR NEGATIVE NEGATIVE Final    Comment:        The GeneXpert MRSA Assay (FDA approved for NASAL specimens only), is one component of a comprehensive MRSA colonization surveillance program. It is not intended to diagnose MRSA infection nor to guide or monitor treatment for MRSA infections.     Radiology Reports Dg Chest 2 View  01/21/2016  CLINICAL DATA:  Fever.  Altered mental status. EXAM: CHEST  2 VIEW COMPARISON:  06/16/2015 FINDINGS: Shallow inspiration with atelectasis in the lung bases. Mild cardiac enlargement with mild vascular congestion. Interstitial changes in the lungs likely represent edema. Possible superimposed infiltration in the right lung base. Calcified and tortuous aorta. No pneumothorax. Left PICC catheter with tip over the low SVC region. Small bilateral pleural effusions. IMPRESSION: Cardiac enlargement with mild pulmonary vascular congestion and interstitial edema. Small pleural effusions. Possible superimposed consolidation in the right lung base. Electronically Signed   By: Lucienne Capers M.D.   On: 01/21/2016 03:09   Dg Pelvis 1-2 Views  01/21/2016  CLINICAL DATA:  Osteonecrosis of both feet. Ulcers in the feet. Decubitus ulcer to the buttocks. EXAM: PELVIS - 1-2 VIEW COMPARISON:  CT abdomen and pelvis 12/25/2014 FINDINGS: Diffuse bone demineralization. Visualization of pelvis is limited due to overlying stool. No definite evidence of any acute displaced fracture. Degenerative changes in the lower lumbar spine and left hip. Previous right hip arthroplasty, incompletely visualized. IMPRESSION:  Diffuse bone demineralization. No acute displaced fractures. Overlying stool limits visualization. Electronically  Signed   By: Lucienne Capers M.D.   On: 01/21/2016 04:36   Ct Head Wo Contrast  01/21/2016  CLINICAL DATA:  Initial valuation for acute altered mental status. Fever. EXAM: CT HEAD WITHOUT CONTRAST TECHNIQUE: Contiguous axial images were obtained from the base of the skull through the vertex without intravenous contrast. COMPARISON:  None. FINDINGS: Diffuse prominence of the CSF containing spaces is compatible with generalized cerebral atrophy. Patchy hypodensity within the periventricular and deep white matter both cerebral hemispheres most consistent with chronic small vessel ischemic disease. No acute intracranial hemorrhage. No large vessel territory infarct. No mass lesion, midline shift, or mass effect. No hydrocephalus. No extra-axial fluid collection para Scalp soft tissues within normal limits. No acute abnormality about the orbits. Paranasal sinuses are clear. No mastoid effusion. Calvarium intact. IMPRESSION: 1. No acute intracranial process. 2. Age-related cerebral atrophy with mild chronic small vessel ischemic disease. Electronically Signed   By: Jeannine Boga M.D.   On: 01/21/2016 06:56   Ct Ankle Left W Contrast  01/11/2016  CLINICAL DATA:  Multiple skin ulcerations on the left foot including a left heel ulcer in patient with peripheral vascular disease. Initial encounter. EXAM: CT OF THE LEFT FOOT WITH CONTRAST; CT OF THE LEFT ANKLE WITH CONTRAST TECHNIQUE: Multidetector CT imaging was performed following the standard protocol during bolus administration of intravenous contrast. CONTRAST:  100 mL OMNIPAQUE IOHEXOL 300 MG/ML  SOLN COMPARISON:  None. FINDINGS: Skin ulceration on the left heel is eccentric medially. Subcutaneous edema is present about the foot. No focal fluid collection is identified. No bony destructive change or periostitis is seen. No erosion is identified.  Joint spaces appear maintained. Bones are osteopenic. No tendon or ligament injury is seen. IMPRESSION: Ulceration on the left heel. The examination is negative for abscess or osteomyelitis. Negative for gout. Osteopenia. Electronically Signed   By: Inge Rise M.D.   On: 01/11/2016 15:48   US Renal  01/23/2016  CLINICAL DATA:  82 year old female with a history of acute kidney injury and diabetes EXAM: RENAL / URINARY TRACT ULTRASOUND COMPLETE COMPARISON:  CT 12/25/2014 FINDINGS: Right Kidney: Length: 8.1 cm. Echogenicity of the right kidney similar to the adjacent liver. No hydronephrosis. Flow confirmed in the hilum. Left Kidney: Length: 7.8 cm. Echogenicity of the left kidney relatively symmetric to the right. No spleen confidently visualized. No hydronephrosis.  Flow appears present at the left kidney hilum. Bladder: Appears normal for degree of bladder distention. IMPRESSION: Sonographic survey demonstrates no hydronephrosis. Signed, Dulcy Fanny. Earleen Newport, DO [facility] Electronically Signed   By: Corrie Mckusick D.O.   On: 01/23/2016 10:26   Ct Foot Left W Contrast  01/11/2016  CLINICAL DATA:  Multiple skin ulcerations on the left foot including a left heel ulcer in patient with peripheral vascular disease. Initial encounter. EXAM: CT OF THE LEFT FOOT WITH CONTRAST; CT OF THE LEFT ANKLE WITH CONTRAST TECHNIQUE: Multidetector CT imaging was performed following the standard protocol during bolus administration of intravenous contrast. CONTRAST:  100 mL OMNIPAQUE IOHEXOL 300 MG/ML  SOLN COMPARISON:  None. FINDINGS: Skin ulceration on the left heel is eccentric medially. Subcutaneous edema is present about the foot. No focal fluid collection is identified. No bony destructive change or periostitis is seen. No erosion is identified. Joint spaces appear maintained. Bones are osteopenic. No tendon or ligament injury is seen. IMPRESSION: Ulceration on the  left heel. The examination is negative for abscess or osteomyelitis. Negative for gout. Osteopenia. Electronically Signed  By: Inge Rise M.D.   On: 01/11/2016 15:48   Dg Foot 2 Views Right  01/21/2016  CLINICAL DATA:  Ulcers to the plantar surface and toes of both feet. EXAM: RIGHT FOOT - 2 VIEW COMPARISON:  None. FINDINGS: Diffuse bone demineralization. Suggestion of bone loss along the fourth and fifth metatarsal heads may indicate osteomyelitis. MRI would be more sensitive for evaluation of osteomyelitis if clinically indicated. No acute fracture or dislocation. Soft tissue swelling most prominent over the hindfoot region. Vascular calcifications. IMPRESSION: Diffuse bone demineralization. Suggestion of bone loss over the fourth and fifth metatarsal heads which may indicate changes of osteomyelitis. Electronically Signed   By: Lucienne Capers M.D.   On: 01/21/2016 04:40   Dg Foot Complete Left  01/21/2016  CLINICAL DATA:  Ulcers of the plantar surface and toes of both feet. EXAM: LEFT FOOT - COMPLETE 3+ VIEW COMPARISON:  None. FINDINGS: Diffuse bone demineralization. No acute fracture or dislocation. No definite bone erosion or cortical changes to suggest osteomyelitis. However, MRI would be more sensitive for evaluation of osteomyelitis if clinically indicated. Vascular calcifications are present. Soft tissue swelling over the plantar aspect of the foot most prominent over the hindfoot region. IMPRESSION: Diffuse bone demineralization. No definite radiographic evidence of osteomyelitis. Electronically Signed   By: Lucienne Capers M.D.   On: 01/21/2016 04:39    CBC  Recent Labs Lab 01/21/16 0212 01/21/16 0226 01/22/16 0320 01/23/16 0500 01/24/16 0855 01/26/16 0435  WBC 23.6*  --  20.4* 13.6* 12.9* 12.9*  HGB 8.7* 10.9* 7.4* 7.5* 7.6* 7.6*  HCT 26.6* 32.0* 22.5* 22.7* 23.0* 22.5*  PLT 364  --  258 266 258 239  MCV 98.2  --  96.6 95.4 95.8 95.3  MCH 32.1  --  31.8 31.5 31.7 32.2   MCHC 32.7  --  32.9 33.0 33.0 33.8  RDW 19.6*  --  19.7* 18.9* 18.6* 19.1*  LYMPHSABS 2.7  --   --   --   --   --   MONOABS 1.8*  --   --   --   --   --   EOSABS 0.2  --   --   --   --   --   BASOSABS 0.0  --   --   --   --   --     Chemistries   Recent Labs Lab 01/21/16 0430  01/23/16 0500 01/24/16 0855 01/25/16 0430 01/26/16 0435 01/27/16 0530  NA  --   < > 142 147* 143 138 140  K  --   < > 4.3 4.4 4.5 4.2 4.9  CL  --   < > 112* 119* 114* 109 111  CO2  --   < > 22 22 23 22 22   GLUCOSE  --   < > 91 82 116* 121* 93  BUN  --   < > 40* 49* 56* 56* 53*  CREATININE  --   < > 1.76* 1.88* 1.86* 1.80* 1.71*  CALCIUM  --   < > 9.4 9.7 9.5 9.7 10.2  AST 20  --   --   --   --   --   --   ALT 9*  --   --   --   --   --   --   ALKPHOS 60  --   --   --   --   --   --   BILITOT 0.3  --   --   --   --   --   --   < > =  values in this interval not displayed. ------------------------------------------------------------------------------------------------------------------ estimated creatinine clearance is 21.7 mL/min (by C-G formula based on Cr of 1.71). ------------------------------------------------------------------------------------------------------------------ No results for input(s): HGBA1C in the last 72 hours. ------------------------------------------------------------------------------------------------------------------ No results for input(s): CHOL, HDL, LDLCALC, TRIG, CHOLHDL, LDLDIRECT in the last 72 hours. ------------------------------------------------------------------------------------------------------------------ No results for input(s): TSH, T4TOTAL, T3FREE, THYROIDAB in the last 72 hours.  Invalid input(s): FREET3 ------------------------------------------------------------------------------------------------------------------ No results for input(s): VITAMINB12, FOLATE, FERRITIN, TIBC, IRON, RETICCTPCT in the last 72 hours.  Coagulation profile No results for  input(s): INR, PROTIME in the last 168 hours.  No results for input(s): DDIMER in the last 72 hours.  Cardiac Enzymes No results for input(s): CKMB, TROPONINI, MYOGLOBIN in the last 168 hours.  Invalid input(s): CK ------------------------------------------------------------------------------------------------------------------ Invalid input(s): POCBNP    Valen Gillison D.O. on 01/27/2016 at 12:47 PM  Between 7am to 7pm - Pager - [phone]  After 7pm go to [email or web address] - password [email or web address]  And look for the night coverage person covering for me after hours  [facility] Office  [phone]

## 2016-01-28 LAB — BASIC METABOLIC PANEL
ANION GAP: 5 (ref 5–15)
BUN: 56 mg/dL — AB (ref 6–20)
CALCIUM: 10.1 mg/dL (ref 8.9–10.3)
CO2: 24 mmol/L (ref 22–32)
Chloride: 116 mmol/L — ABNORMAL HIGH (ref 101–111)
Creatinine, Ser: 1.86 mg/dL — ABNORMAL HIGH (ref 0.44–1.00)
GFR calc Af Amer: 27 mL/min — ABNORMAL LOW (ref >60)
GFR calc non Af Amer: 23 mL/min — ABNORMAL LOW (ref >60)
GLUCOSE: 86 mg/dL (ref 65–99)
POTASSIUM: 4.6 mmol/L (ref 3.5–5.1)
Sodium: 145 mmol/L (ref 135–145)

## 2016-01-28 LAB — CBC
HEMATOCRIT: 20 % — AB (ref 36.0–46.0)
Hemoglobin: 6.6 g/dL — CL (ref 12.0–15.0)
MCH: 31.7 pg (ref 26.0–34.0)
MCHC: 33 g/dL (ref 30.0–36.0)
MCV: 96.2 fL (ref 78.0–100.0)
PLATELETS: 234 10*3/uL (ref 150–400)
RBC: 2.08 MIL/uL — ABNORMAL LOW (ref 3.87–5.11)
RDW: 18.9 % — AB (ref 11.5–15.5)
WBC: 16.6 10*3/uL — AB (ref 4.0–10.5)

## 2016-01-28 LAB — GLUCOSE, CAPILLARY
GLUCOSE-CAPILLARY: 74 mg/dL (ref 65–99)
Glucose-Capillary: 104 mg/dL — ABNORMAL HIGH (ref 65–99)
Glucose-Capillary: 85 mg/dL (ref 65–99)
Glucose-Capillary: 84 mg/dL (ref 65–99)
Glucose-Capillary: 95 mg/dL (ref 65–99)

## 2016-01-28 LAB — PREPARE RBC (CROSSMATCH)

## 2016-01-28 LAB — HEMOGLOBIN AND HEMATOCRIT, BLOOD
HEMATOCRIT: 25 % — AB (ref 36.0–46.0)
HEMOGLOBIN: 8.3 g/dL — AB (ref 12.0–15.0)

## 2016-01-28 MED ORDER — SODIUM CHLORIDE 0.9 % IV SOLN: Freq: Once | INTRAVENOUS | Status: DC

## 2016-01-28 NOTE — Progress Notes (Signed)
01/28/16 0530 Nursing Dr. Baltazar Najjar paged reg hgb 6.6 this am   No response at this time. 0600.

## 2016-01-28 NOTE — Progress Notes (Addendum)
PROGRESS NOTE    Shari Mcmahon  [id number]  DOB: 18-Jul-1927  DOA: 01/21/2016 PCP: Gildardo Cranker, DO Outpatient Specialists: Vascular surgeon: Dr. Al Decant course: 82 y.o. female with PMH of dementia, severe protein calorie malnutrition, dysphagia, recurrent UTIs, type 2 diabetes,  peripheral vascular disease (not a candidate for revascularization) with osteomyelitis of the left calcaneus treated with IV antibiotics via a PICC line (which was apparently placed on or about 01/04/16 according to progress notes, and received 14 days of IV cefepime), chronic pain related to arthritis/gout, HLD, HTN, who was brought to the hospital on 3/23 for evaluation from her SNF secondary to lethargy and changes in her mental status. She was admitted for aspiration versus HCAP. Palliative care consulted for goals of care-continued medical treatment. Acute kidney injury-nephrology consulted. Capacity issues-psychiatric consulted and patient lacks capacity to make medical decisions. Creatinine has plateaued. Improved from respiratory standpoint. Hemoglobin dropped to 6.6 on 3/30 and transfusing 1 unit of PRBC. DC home with youngest daughter, possibly 3/31.   Assessment & Plan:    Aspiration pneumonia versus HCAP (healthcare-associated pneumonia)In a patient with dysphagia and Alzheimer's dementia - Chest x-ray showed right lung base infiltrate. Patient with known history of dysphagia. - Likely an aspiration pneumonia in a patient at risk for resistant organisms. As per daughter, patient has had prior episodes of aspiration. - Blood cultures 2: Negative. Urine culture-VRE (only 20,000 colonies-likely a contaminant or a colonizer). MRSA PCR: Negative. Flu panel PCR: Negative. Urinary streptococcal antigen and sputum cultures have not been sent. - Patient has are ready received Tamiflu as an outpatient, so would hold further Tamiflu for now. - Treated empirically with cefepime and  vancomycin to cover resistant organisms. DC'ed vancomycin. Patient has completed 5 days of IV cefepime. Changed to oral levofloxacin and completed total 7 days treatment. - Speech therapy input appreciated and recommend regular diet, thin liquids and aspiration precautions. - Patient is at high risk for recurrent aspiration related to dementia and mental status changes. This has been repeatedly discussed with patient's 2 daughters and they verbalized understanding.  Acute kidney injury - Baseline creatinine not clear. Possibly due to poor oral intake.  - Despite IV fluids, creatinine had progressively increased to 1.8 on 3/26. Renal ultrasound without hydronephrosis.  - IV fluids were changed from normal saline to D5 on 3/26 secondary to hypernatremia. Creatinine has plateaued in the 1.8 range. - Nephrology consulted. Input appreciated. Checking further labs (SPEP/UPEP, FeNA). They do not recommend aggressive therapy given her multiple, irreversible comorbidities and poor functional status. Also indicate poor overall prognosis-agree. - Patient is getting volume overloaded. Discussed with Dr. Marval Regal 3/28 and agreed to discontinue IV fluids. Not candidate for dialysis. Discussed with HCPOA 3/28. - Creatinine has plateaued in the 1.7-1.8 range.  Chronic anemia  - Baseline hemoglobin probably in the 8 g per DL range. Hemoglobin had been holding steady in the mid 7 g per DL range until 3/30 when hemoglobin dropped to 6.6-no reported bleeding. Transfusing 1 unit of PRBC. Follow up CBC in a.m.   Acute encephalopathy complicating underlying chronic mental status changes related to dementia. - Secondary to acute illness complicating underlying dementia.  - Mental status waxing and waning which may be related to acute kidney injury, pneumonia and medications (Depakote) complicating underlying dementia. - CT head without acute findings.  - Treat underlying cause. DC'ed Depakote 3/27 as per psychiatry  recommendations and follow. Patient seems to be more alert this morning. As per family, mental status  has significantly improved and more alert and communicative.  Multiple pressure injuries - Evaluation and management as per wound care team evaluation on 01/21/16.   Essential hypertension - Not currently on antihypertensives, and blood pressure controlled.   Alzheimer's dementia - Continue Aricept, Remeron. As per psychiatry, no clear indication for Depakote and hence discontinued. Discussed with HCPOA who indicated that Depakote had been started when she first went into the SNF due to issues with agitation and behavioral abnormalities. Advised her that currently this is being held secondary to sedation and will have to be reassessed in the future if she has issues with agitation or behavioral changes.   Diabetes mellitus, type 2 (HCC) - Insulin sensitive SSI ordered. Controlled.   Peripheral autonomic neuropathy due to diabetes mellitus ([place]) - Unfortunately, has not tolerated Neurontin due to excessive lethargy.   Type II diabetes mellitus with peripheral circulatory disorder (HCC) - Not a candidate for revascularization.   Pressure ulcer/ Ulcer of left heel and midfoot with necrosis of muscle ([place]) - Wound care consultation appreciated.   Dysphagia - Diet per speech therapy recommendations.   Severe protein calorie malnutrition - Secondary to advanced age, dementia and poor oral intake.  Chronic pain -Related to diffuse arthritis, gout, peripheral neuropathy. As per daughter, patient also with pain with minimal movement of any part of her body. This is one reason why she has not participated in therapies in the past.  Adult failure to thrive - Patient used to be fully functional, driving and helping out other people until fall in May 2016. Since then she has not ambulated and mostly bedbound or moves around with the assistance of a wheelchair. Progressive steady  decline. Poor overall long-term prognosis given advanced age, frail physical status, dementia, poor nutrition and concern for recurrent aspiration related to mental status, nonhealing and development of new pressure sores due to nonambulatory status. Discussed at length with patient's daughter/healthcare power of attorney who is an Therapist, sports with [place] [employer]. She is agreeable to palliative care consultation for goals of care.    - Palliative care input appreciated. Apparent disagreement in family. DO NOT RESUSCITATE confirmed. Youngest daughter wishes to take patient home with his other 3 siblings would like her to go to SNF with palliative care and eventually hospice. - As per psychiatry input, patient does not have capacity to make medical decisions and hence decision making will be deferred to patient's healthcare power of attorney Ms. Camille Bal. Unfortunately there seems to be disagreement between patient's youngest daughter Ms. Leodis Liverpool and healthcare power of attorney and other siblings. - At this point, social worker reports that HCPOA has deferred discharge disposition to her youngest sister and is agreeable to allow patient to be discharged home with Ms. Hulda Marin.    DVT prophylaxis: Lovenox Code Status: DO NOT RESUSCITATE  Family Communication: Discussed with patient's youngest daughter Ms. Leodis Liverpool at bedside on 3/30. Discussed at length with Ms. Murphy/HCPOA on 3/28. Updated care and answered questions. Disposition Plan: DC to home possibly 3/31    Consultants:  Palliative care team   Psychiatry  Nephrology  Procedures:  None   Antimicrobials:  Acyclovir 3/231 dose   IV cefepime 3/23 > 3/28  Metronidazole 3/221 dose  IV vancomycin 3/22 > 3/24  Oral levofloxacin 3/28 > completed.   Subjective: Swelling of forearms. No pain. As per daughter, alert and eating when fed.  Daughter wants opoid's DC'ed.  Objective: Filed Vitals:    01/28/16 1209 01/28/16 1232  01/28/16 1420 01/28/16 1447  BP: 157/63 155/57 145/56 164/62  Pulse: 77 74 75 76  Temp: 98.8 F (37.1 C) 98.5 F (36.9 C) 99.8 F (37.7 C) 98.5 F (36.9 C)  TempSrc: Oral Oral Oral Axillary  Resp: 15 16 15 16   Height:      Weight:      SpO2: 99% 98% 100% 100%    Intake/Output Summary (Last 24 hours) at 01/28/16 1453 Last data filed at 01/28/16 1447  Gross per 24 hour  Intake    898 ml  Output      0 ml  Net    898 ml   Filed Weights   01/21/16 0817 01/21/16 1129  Weight: 62.596 kg (138 lb) 62.596 kg (138 lb)    Exam:  General exam: Small built, frail, chronically ill-looking elderly female, sitting comfortably propped up in bed. Respiratory system: Diminished breath sounds in the bases. Rest of lung fields clear to auscultation. No increased work of breathing. Cardiovascular system: S1 & S2 heard, RRR. No JVD, murmurs, gallops, clicks or pedal edema. Gastrointestinal system: Abdomen is nondistended, soft and nontender. Normal bowel sounds heard. Central nervous system: Alert and oriented only to self. No focal neurological deficits. Extremities: No acute findings. Chronic arthritic changes of both hands and fingers without acute findings. Developing bilateral forearm/hand edema.   Data Reviewed: Basic Metabolic Panel:  Recent Labs Lab 01/24/16 0855 01/25/16 0430 01/26/16 0435 01/27/16 0530 01/28/16 0403  NA 147* 143 138 140 145  K 4.4 4.5 4.2 4.9 4.6  CL 119* 114* 109 111 116*  CO2 22 23 22 22 24   GLUCOSE 82 116* 121* 93 86  BUN 49* 56* 56* 53* 56*  CREATININE 1.88* 1.86* 1.80* 1.71* 1.86*  CALCIUM 9.7 9.5 9.7 10.2 10.1  PHOS  --   --   --  3.4  --    Liver Function Tests:  Recent Labs Lab 01/27/16 0530  ALBUMIN 1.8*   No results for input(s): LIPASE, AMYLASE in the last 168 hours. No results for input(s): AMMONIA in the last 168 hours. CBC:  Recent Labs Lab 01/22/16 0320 01/23/16 0500 01/24/16 0855 01/26/16 0435  01/28/16 0403  WBC 20.4* 13.6* 12.9* 12.9* 16.6*  HGB 7.4* 7.5* 7.6* 7.6* 6.6*  HCT 22.5* 22.7* 23.0* 22.5* 20.0*  MCV 96.6 95.4 95.8 95.3 96.2  PLT 258 266 258 239 234   Cardiac Enzymes: No results for input(s): CKTOTAL, CKMB, CKMBINDEX, TROPONINI in the last 168 hours. BNP (last 3 results) No results for input(s): PROBNP in the last 8760 hours. CBG:  Recent Labs Lab 01/27/16 0817 01/27/16 1723 01/27/16 2306 01/28/16 0803 01/28/16 1231  GLUCAP 74 88 104* 85 84    Recent Results (from the past 240 hour(s))  Culture, blood (Routine X 2) w Reflex to ID Panel     Status: None   Collection Time: 01/21/16  4:30 AM  Result Value Ref Range Status   Specimen Description BLOOD BLOOD RIGHT HAND  Final   Special Requests BOTTLES DRAWN AEROBIC AND ANAEROBIC [place]  Final   Culture   Final    NO GROWTH 5 DAYS Performed at [facility]    Report Status 01/26/2016 FINAL  Final  Culture, blood (Routine X 2) w Reflex to ID Panel     Status: None   Collection Time: 01/21/16  4:30 AM  Result Value Ref Range Status   Specimen Description BLOOD BLOOD LEFT HAND  Final   Special Requests BOTTLES DRAWN AEROBIC AND  ANAEROBIC 6CC  Final   Culture   Final    NO GROWTH 5 DAYS Performed at [facility]    Report Status 01/26/2016 FINAL  Final  Urine culture     Status: None   Collection Time: 01/21/16  8:21 AM  Result Value Ref Range Status   Specimen Description URINE, CATHETERIZED  Final   Special Requests NONE  Final   Culture   Final    20,000 COLONIES/mL VANCOMYCIN RESISTANT ENTEROCOCCUS Performed at [facility]    Report Status 01/23/2016 FINAL  Final   Organism ID, Bacteria VANCOMYCIN RESISTANT ENTEROCOCCUS  Final      Susceptibility   Vancomycin resistant enterococcus - MIC*    AMPICILLIN >=32 RESISTANT Resistant     LEVOFLOXACIN >=8 RESISTANT Resistant     NITROFURANTOIN 256 RESISTANT Resistant     VANCOMYCIN >=32 RESISTANT Resistant     LINEZOLID 2  SENSITIVE Sensitive     * 20,000 COLONIES/mL VANCOMYCIN RESISTANT ENTEROCOCCUS  MRSA PCR Screening     Status: None   Collection Time: 01/21/16 12:45 PM  Result Value Ref Range Status   MRSA by PCR NEGATIVE NEGATIVE Final    Comment:        The GeneXpert MRSA Assay (FDA approved for NASAL specimens only), is one component of a comprehensive MRSA colonization surveillance program. It is not intended to diagnose MRSA infection nor to guide or monitor treatment for MRSA infections.          Studies: No results found.      Scheduled Meds: . sodium chloride   Intravenous Once  . donepezil  10 mg Oral QHS  . enoxaparin (LOVENOX) injection  30 mg Subcutaneous Q24H  . febuxostat  40 mg Oral Daily  . feeding supplement (ENSURE ENLIVE)  237 mL Oral BID BM  . feeding supplement (PRO-STAT SUGAR FREE 64)  30 mL Oral TID WC  . lactose free nutrition  237 mL Oral TID BM  . latanoprost  1 drop Both Eyes QHS  . memantine  28 mg Oral Daily  . mirtazapine  7.5 mg Oral QHS  . multivitamin with minerals  1 tablet Oral Daily  . pantoprazole  40 mg Oral Daily  . senna  1 tablet Oral BID  . sodium chloride flush  10-40 mL Intracatheter Q12H  . vitamin C  500 mg Oral BID   Continuous Infusions:    Principal Problem:   Alzheimer's dementia Active Problems:   Essential hypertension   Dysphagia   Diabetes mellitus, type 2 (HCC)   Peripheral autonomic neuropathy due to diabetes mellitus (HCC)   Type II diabetes mellitus with peripheral circulatory disorder (HCC)   Pressure ulcer   Ulcer of left heel and midfoot with necrosis of muscle (HCC)   HCAP (healthcare-associated pneumonia)   Encounter for palliative care   Goals of care, counseling/discussion   Aspiration pneumonia (HCC)   AKI (acute kidney injury) ([place])   Hypernatremia    Time spent: 15 minutes.    Vernell Leep, MD, FACP, FHM. [facility] Pager [phone]  If 7PM-7AM, please contact  night-coverage [email or web address] Password [email or web address] 01/28/2016, 2:53 PM    LOS: 7 days

## 2016-01-28 NOTE — Progress Notes (Signed)
[facility]  Rec'd orders for bed, lift, commode and wheelchair.  Patient rec'd a wheelchair that is still on rental with [place] 05/06/2015.  We are not able to provide one at this time.  We will contact the daughter to see if she can track the wheelchair down.  All other items were cleared for delivery.  I have picked up the wheelchair from the patient's room, and shredded the paperwork for it.      Linward Headland 01/28/2016, 12:46 PM

## 2016-01-28 NOTE — Care Management Important Message (Signed)
Important Message  Patient Details IM Letter given to Sarah/Case Manager to present to Patient Name: Shari Mcmahon MRN: [id number] Date of Birth: Aug 17, 1927   Medicare Important Message Given:  Yes    Camillo Flaming 01/28/2016, 9:08 AMImportant Message  Patient Details  Name: Shari Mcmahon MRN: [id number] Date of Birth: 08/07/1927   Medicare Important Message Given:  Yes    Camillo Flaming 01/28/2016, 9:08 AM

## 2016-01-28 NOTE — Progress Notes (Signed)
01/28/16 0115 Nursing IV Team called reg patient's swollen l hand. picc line in upper l arm wnl. Iv team to assess .

## 2016-01-28 NOTE — Progress Notes (Signed)
CM spoke with Hulda Marin who states she wants to pursue aggressive treatment for her mother and does not want hospice but does accept home health services previously arranged by CM.  [facility] has delivered all the equipment and requests ambulance transport home for pt.  Pt is set up with [facility] HHPT/RN/SW/Aide.  Pt has hospital bed, hoyer lift, wheelchair with cushion, and 3n1.  Cm will arrange for nonemergency transport home when pt has been discharged.  Hulda Marin contact number is [phone].

## 2016-01-28 NOTE — Progress Notes (Signed)
Hemoglobin 8.3, HCT 25.0 post blood transfusion. Patient's left hand more edematous and painful than earlier this AM. Elevated on pillow throughout the day. Dr. Algis Liming notified.

## 2016-01-28 NOTE — Progress Notes (Signed)
Nutrition Brief Note  Chart reviewed. Pt now transitioning to comfort care.  No further nutrition interventions warranted at this time.  Please consult as needed.   Tandre Conly, MS, RD, LDN Pager: [phone] After Hours Pager: [phone]    

## 2016-01-29 LAB — CBC
HEMATOCRIT: 25.1 % — AB (ref 36.0–46.0)
Hemoglobin: 8.3 g/dL — ABNORMAL LOW (ref 12.0–15.0)
MCH: 30.9 pg (ref 26.0–34.0)
MCHC: 33.1 g/dL (ref 30.0–36.0)
MCV: 93.3 fL (ref 78.0–100.0)
PLATELETS: 259 10*3/uL (ref 150–400)
RBC: 2.69 MIL/uL — AB (ref 3.87–5.11)
RDW: 20.8 % — AB (ref 11.5–15.5)
WBC: 17.5 10*3/uL — AB (ref 4.0–10.5)

## 2016-01-29 LAB — TYPE AND SCREEN
ABO/RH(D): A POS
ANTIBODY SCREEN: NEGATIVE
UNIT DIVISION: 0

## 2016-01-29 LAB — GLUCOSE, CAPILLARY
GLUCOSE-CAPILLARY: 63 mg/dL — AB (ref 65–99)
GLUCOSE-CAPILLARY: 93 mg/dL (ref 65–99)

## 2016-01-29 LAB — BASIC METABOLIC PANEL
ANION GAP: 9 (ref 5–15)
BUN: 57 mg/dL — ABNORMAL HIGH (ref 6–20)
CALCIUM: 10.2 mg/dL (ref 8.9–10.3)
CO2: 23 mmol/L (ref 22–32)
Chloride: 114 mmol/L — ABNORMAL HIGH (ref 101–111)
Creatinine, Ser: 1.8 mg/dL — ABNORMAL HIGH (ref 0.44–1.00)
GFR, EST AFRICAN AMERICAN: 28 mL/min — AB (ref >60)
GFR, EST NON AFRICAN AMERICAN: 24 mL/min — AB (ref >60)
Glucose, Bld: 73 mg/dL (ref 65–99)
POTASSIUM: 4.2 mmol/L (ref 3.5–5.1)
Sodium: 146 mmol/L — ABNORMAL HIGH (ref 135–145)

## 2016-01-29 MED ORDER — ENSURE ENLIVE PO LIQD: 237.0000 mL | Freq: Two times a day (BID) | ORAL | Status: DC

## 2016-01-29 MED ORDER — BOOST PLUS PO LIQD: 237.0000 mL | Freq: Three times a day (TID) | ORAL | Status: AC

## 2016-01-29 MED ORDER — COLCHICINE 0.6 MG PO TABS: 0.6000 mg | ORAL_TABLET | Freq: Every day | ORAL | Status: DC | PRN

## 2016-01-29 MED ORDER — ACETAMINOPHEN 325 MG PO TABS: 650.0000 mg | ORAL_TABLET | Freq: Four times a day (QID) | ORAL | Status: DC | PRN

## 2016-01-29 NOTE — Progress Notes (Signed)
Spoke with Shari Mcmahon, legal POA, states that she is aware that her sister, Shari Mcmahon, is taking the patient home to care for her.  [name] states that she has discussed the matter with her other siblings and that they have decided to allow Shari Mcmahon to take their mother home instead of returning her to a SNF.  MD made aware, will continue to monitor.

## 2016-01-29 NOTE — Plan of Care (Signed)
Problem: Nutrition: Goal: Adequate nutrition will be maintained Outcome: Not Met (add Reason) Patient has poor po intake.  Problem: Bowel/Gastric: Goal: Will not experience complications related to bowel motility Outcome: Not Applicable Date Met:  26/83/41 Patient is incontinent of bowel and bladder.

## 2016-01-29 NOTE — Telephone Encounter (Signed)
Called willie may to advise. She states that she needs to call back. Advised that whomever she speaks to will have access to these notes

## 2016-01-29 NOTE — Discharge Summary (Signed)
Physician Discharge Summary  Shari Mcmahon  [id number]  DOB: Feb 03, 1927  DOA: 01/21/2016  PCP: Gildardo Cranker, DO  Vascular surgeon: Dr. Deitra Mayo  Admit date: 01/21/2016 Discharge date: 01/29/2016  Time spent: Greater than 30 minutes  Recommendations for Outpatient Follow-up:  1. Dr. Gildardo Cranker, PCP in one week with repeat labs (CBC & BMP). 2. Arville Go home health PT, RN, social worker, Aide, hospital bed, hoyer lift, wheelchair with cushion and 3 n 1 3. Dr. Deitra Mayo, vascular surgery as needed.  Discharge Diagnoses:  Principal Problem:   Alzheimer's dementia Active Problems:   Essential hypertension   Dysphagia   Diabetes mellitus, type 2 ([place])   Peripheral autonomic neuropathy due to diabetes mellitus (HCC)   Type II diabetes mellitus with peripheral circulatory disorder (HCC)   Pressure ulcer   Ulcer of left heel and midfoot with necrosis of muscle (HCC)   HCAP (healthcare-associated pneumonia)   Encounter for palliative care   Goals of care, counseling/discussion   Aspiration pneumonia ([place])   AKI (acute kidney injury) ([place])   Hypernatremia   Discharge Condition: Improved & Stable. Overall prognosis poor and at high risk for rapid decline.  Diet recommendation: Regular diet with precautions as recommended by speech therapy.  Filed Weights   01/21/16 0817 01/21/16 1129  Weight: 62.596 kg (138 lb) 62.596 kg (138 lb)    History of present illness:  Aspiration pneumonia versus HCAP (healthcare-associated pneumonia)In a patient with dysphagia and Alzheimer's dementia - Chest x-ray showed right lung base infiltrate. Patient with known history of dysphagia. - Likely an aspiration pneumonia in a patient at risk for resistant organisms. As per daughter, patient has had prior episodes of aspiration. - Blood cultures 2: Negative. Urine culture-VRE (only 20,000 colonies-likely a contaminant or a colonizer). MRSA PCR: Negative. Flu panel PCR:  Negative. Urinary streptococcal antigen and sputum cultures have not been sent. - Patient has are ready received Tamiflu as an outpatient, so no further Tamiflu given. - Treated empirically with cefepime and vancomycin to cover resistant organisms. DC'ed vancomycin. Patient has completed 5 days of IV cefepime. Changed to oral levofloxacin and completed total 7 days treatment. - Speech therapy input appreciated and recommend regular diet, thin liquids and aspiration precautions. - Patient is at high risk for recurrent aspiration related to dementia and mental status changes. This has been repeatedly discussed with patient's 2 daughters and they verbalized understanding.  Acute kidney injury - Baseline creatinine not clear. Possibly due to poor oral intake.  - Despite IV fluids, creatinine had progressively increased to 1.8 on 3/26. Renal ultrasound without hydronephrosis.  - IV fluids were changed from normal saline to D5 on 3/26 secondary to hypernatremia. Creatinine has plateaued in the 1.8 range. - Nephrology consulted. Input appreciated. Checking further labs (SPEP/UPEP, FeNA). They do not recommend aggressive therapy given her multiple, irreversible comorbidities and poor functional status. Also indicate poor overall prognosis-agree. - Patient is getting volume overloaded. Discussed with Dr. Marval Regal 3/28 and agreed to discontinue IV fluids. Not candidate for dialysis. Discussed with HCPOA 3/28. - Creatinine has plateaued in the 1.7-1.8 range.  Chronic anemia  - Baseline hemoglobin probably in the 8 g per DL range. Hemoglobin had been holding steady in the mid 7 g per DL range until 3/30 when hemoglobin dropped to 6.6-no reported bleeding. S/P 1 unit of PRBC. Hemoglobin has improved to 8.3 g per DL.  Acute encephalopathy complicating underlying chronic mental status changes related to dementia. - Secondary to acute illness complicating underlying dementia.  -  Mental status waxing and  waning which may be related to acute kidney injury, pneumonia and medications (Depakote) complicating underlying dementia. - CT head without acute findings.  - Treat underlying cause. DC'ed Depakote 3/27 as per psychiatry recommendations and follow. Patient is more alert . As per family, mental status has significantly improved and more alert and communicative.  Multiple pressure injuries - Evaluation and management as per wound care team evaluation on 01/21/16.   Essential hypertension - Not currently on antihypertensives, and blood pressure controlled.   Alzheimer's dementia - Continue Aricept, Namenda & Remeron. As per psychiatry, no clear indication for Depakote and hence discontinued. Discussed with HCPOA who indicated that Depakote had been started when she first went into the SNF due to issues with agitation and behavioral abnormalities. Advised her that currently this is being held secondary to sedation and will have to be reassessed in the future if she has issues with agitation or behavioral changes.   Diabetes mellitus, type 2 ([place]) - Has been off hypoglycemics and CBGs were well controlled until hypoglycemic range CBG earlier today. Treat appropriately and follow. At risk for recurrent hypoglycemia due to poor oral intake and renal insufficiency.   Peripheral autonomic neuropathy due to diabetes mellitus ([place]) - Unfortunately, has not tolerated Neurontin due to excessive lethargy.   Type II diabetes mellitus with peripheral circulatory disorder (HCC) - Not a candidate for revascularization.   Pressure ulcer/ Ulcer of left heel and midfoot with necrosis of muscle ([place]) - Wound care consultation appreciated.  Dysphagia - Diet per speech therapy recommendations.  Severe protein calorie malnutrition - Secondary to advanced age, dementia and poor oral intake. Continue nutritional supplements.   Chronic pain -Related to diffuse arthritis, gout, peripheral neuropathy. As  per daughter, patient also with pain with minimal movement of any part of her body. This is one reason why she has not participated in therapies in the past.  Gout - Continue allopurinol and when necessary colchicine.  Adult failure to thrive - Patient used to be fully functional, driving and helping out other people until fall in May 2016. Since then she has not ambulated and mostly bedbound or moves around with the assistance of a wheelchair. Progressive steady decline. Poor overall long-term prognosis given advanced age, frail physical status, dementia, poor nutrition and concern for recurrent aspiration related to mental status, nonhealing and development of new pressure sores due to nonambulatory status. Discussed at length with patient's daughter/healthcare power of attorney who is an Therapist, sports with [place] [employer]. She is agreeable to palliative care consultation for goals of care.  - Palliative care input appreciated. Apparent disagreement in family. DO NOT RESUSCITATE confirmed. Youngest daughter wishes to take patient home with his other 3 siblings would like her to go to SNF with palliative care and eventually hospice. - As per psychiatry input, patient does not have capacity to make medical decisions and hence decision making will be deferred to patient's healthcare power of attorney Ms. Shari Mcmahon. Unfortunately there seems to be disagreement between patient's youngest daughter Ms. Shari Mcmahon and healthcare power of attorney and other siblings. - At this point, social worker reports that HCPOA has deferred discharge disposition to her youngest sister and is agreeable to allow patient to be discharged home with Ms. Shari Mcmahon. - Patient is at risk for decline and has poor prognosis. As stated above, this has been discussed at length with healthcare power of attorney and several family members by multiple people from the medical care team.  All seem to understand except patient's  youngest daughter Ms. Shari Mcmahon who has insisted on taking patient home and feels that her mother will make good recovery with at home TLC despite several attempts to explain to her that despite all her TLC, patient is probably going to decline.    DO NOT RESUSCITATE    Consultants:  Palliative care team   Psychiatry  Nephrology  Procedures:  Left upper extremity PICC line-DC'd 3/31  Antimicrobials:  Acyclovir 3/231 dose   IV cefepime 3/23 > 3/28  Metronidazole 3/221 dose  IV vancomycin 3/22 > 3/24  Oral levofloxacin 3/28 > completed.  Discharge Exam:  Complaints:  patient denies complaints. Some pain in bilateral hands and generalized soreness. As per daughter at bedside, no acute issues. As per RN, transient left upper extremity swelling posttransfusion yesterday has improved/resolved.   Filed Vitals:   01/28/16 1420 01/28/16 1447 01/28/16 1944 01/29/16 0544  BP: 145/56 164/62 132/47 149/53  Pulse: 75 76 71 71  Temp: 99.8 F (37.7 C) 98.5 F (36.9 C) 97.6 F (36.4 C) 98 F (36.7 C)  TempSrc: Oral Axillary Oral Oral  Resp: 15 16 16 16   Height:      Weight:      SpO2: 100% 100% 97% 99%    General exam: Small built, frail, chronically ill-looking elderly female, sitting comfortably propped up in bed. Respiratory system: Diminished breath sounds in the bases. Rest of lung fields clear to auscultation. No increased work of breathing. Cardiovascular system: S1 & S2 heard, RRR. No JVD, murmurs, gallops, clicks or pedal edema. Gastrointestinal system: Abdomen is nondistended, soft and nontender. Normal bowel sounds heard. Central nervous system: Alert and oriented only to self. No focal neurological deficits. Extremities: No acute findings. Chronic arthritic changes of both hands and fingers without acute findings. B/L forearm edema improved and almost resolved.    Discharge Instructions      Discharge Instructions    Call MD for:  difficulty breathing,  headache or visual disturbances    Complete by:  As directed      Call MD for:  extreme fatigue    Complete by:  As directed      Call MD for:  persistant dizziness or light-headedness    Complete by:  As directed      Call MD for:  persistant nausea and vomiting    Complete by:  As directed      Call MD for:  redness, tenderness, or signs of infection (pain, swelling, redness, odor or green/yellow discharge around incision site)    Complete by:  As directed      Call MD for:  severe uncontrolled pain    Complete by:  As directed      Call MD for:  temperature >100.4    Complete by:  As directed      Diet general    Complete by:  As directed      Discharge instructions    Complete by:  As directed   DIET: Diet recommendations: Regular (order softer foods due to pt's lack of upper dentition and dysphagia) Liquids provided via: Cup;No straw Compensations: Minimize environmental distractions;Slow rate;Small sips/bites (use applesauce/puree to help clear oral particulates) Postural Changes and/or Swallow Maneuvers: Out of bed for meals     Discharge wound care:    Complete by:  As directed   As per Wound Care Nurse recommendations:  Wound type:Pressure Pressure Ulcer POA: Yes Measurement:sacral: 1.5cm x 1cm x 0.4cm in area of  previous wound healing (scarring) measuring 6cm x 3.5cm. Stage 3. Pink, moist wound bed with serous exudate (small). Dry gangrene on right foot, 4th digit and callous on left foot, lateral aspect of 5th digit measuring 2.5cm x 2cm. Right posterior LE with full thickness wound measuring 1.5cm x 1cm x 0.2cm with small to moderate serous exudate. Wound bed:AS described above. Drainage (amount, consistency, odor) As described above Periwound:intact, dry. Dressing procedure/placement/frequency: continue present treatment, with a calcium alginate dressing topped with a silicone foam, but increase dressing changes to daily.     Increase activity slowly    Complete by:   As directed             Medication List    STOP taking these medications        ceFEPIme 2 g in dextrose 5 % 50 mL     divalproex 125 MG capsule  Commonly known as:  DEPAKOTE SPRINKLES     HYDROcodone-acetaminophen 5-325 MG tablet  Commonly known as:  NORCO/VICODIN     insulin aspart 100 UNIT/ML injection  Commonly known as:  novoLOG     magnesium hydroxide 400 MG/5ML suspension  Commonly known as:  MILK OF MAGNESIA     oseltamivir 75 MG capsule  Commonly known as:  TAMIFLU      TAKE these medications        acetaminophen 325 MG tablet  Commonly known as:  TYLENOL  Take 2 tablets (650 mg total) by mouth every 6 (six) hours as needed for mild pain, moderate pain, fever or headache.     colchicine 0.6 MG tablet  Take 1 tablet (0.6 mg total) by mouth daily as needed (Gout pain.).     donepezil 10 MG tablet  Commonly known as:  ARICEPT  Take 10 mg by mouth at bedtime.     febuxostat 40 MG tablet  Commonly known as:  ULORIC  Take 40 mg by mouth daily.     feeding supplement (ENSURE ENLIVE) Liqd  Take 237 mLs by mouth 2 (two) times daily between meals.     lactose free nutrition Liqd  Take 237 mLs by mouth 3 (three) times daily between meals.     feeding supplement (PRO-STAT SUGAR FREE 64) Liqd  Take 30 mLs by mouth 3 (three) times daily with meals.     latanoprost 0.005 % ophthalmic solution  Commonly known as:  XALATAN  Place 1 drop into both eyes at bedtime.     mirtazapine 7.5 MG tablet  Commonly known as:  REMERON  Take 7.5 mg by mouth at bedtime.     Multiple Vitamin tablet  Take 1 tablet by mouth daily.     NAMENDA XR 28 MG Cp24 24 hr capsule  Generic drug:  memantine  Take 28 mg by mouth daily.     omeprazole 40 MG capsule  Commonly known as:  PRILOSEC  Take 1 capsule (40 mg total) by mouth daily.     senna 8.6 MG tablet  Commonly known as:  SENOKOT  Take 1 tablet by mouth 2 (two) times daily. Reported on 10/29/2015     vitamin C 500 MG  tablet  Commonly known as:  ASCORBIC ACID  Take 500 mg by mouth 2 (two) times daily.       Follow-up Information    Follow up with [facility].   Why:  Home Health RN, Physical Therapy, Social Worker and Proofreader information:   [address]  [place] [address] [phone]       Follow up with Gildardo Cranker, DO. Schedule an appointment as soon as possible for a visit in 1 week.   Specialty:  Internal Medicine   Why:  To be seen with repeat labs (CBC & BMP).   Contact information:   [place] [address] [phone]       Schedule an appointment as soon as possible for a visit with Deitra Mayo, MD.   Specialties:  Vascular Surgery, Cardiology   Why:  As needed, If symptoms worsen   Contact information:   [place] [address] [phone]       Get Medicines reviewed and adjusted: Please take all your medications with you for your next visit with your Primary MD  Please request your Primary MD to go over all hospital tests and procedure/radiological results at the follow up. Please ask your Primary MD to get all Hospital records sent to his/her office.  If you experience worsening of your admission symptoms, develop shortness of breath, life threatening emergency, suicidal or homicidal thoughts you must seek medical attention immediately by calling 911 or calling your MD immediately if symptoms less severe.  You must read complete instructions/literature along with all the possible adverse reactions/side effects for all the Medicines you take and that have been prescribed to you. Take any new Medicines after you have completely understood and accept all the possible adverse reactions/side effects.   Do not drive when taking pain medications.   Do not take more than prescribed Pain, Sleep and Anxiety Medications  Special Instructions: If you have smoked or chewed Tobacco in the last 2 yrs please stop  smoking, stop any regular Alcohol and or any Recreational drug use.  Wear Seat belts while driving.  Please note  You were cared for by a hospitalist during your hospital stay. Once you are discharged, your primary care physician will handle any further medical issues. Please note that NO REFILLS for any discharge medications will be authorized once you are discharged, as it is imperative that you return to your primary care physician (or establish a relationship with a primary care physician if you do not have one) for your aftercare needs so that they can reassess your need for medications and monitor your lab values.    The results of significant diagnostics from this hospitalization (including imaging, microbiology, ancillary and laboratory) are listed below for reference.    Significant Diagnostic Studies: Dg Chest 2 View  01/21/2016  CLINICAL DATA:  Fever.  Altered mental status. EXAM: CHEST  2 VIEW COMPARISON:  06/16/2015 FINDINGS: Shallow inspiration with atelectasis in the lung bases. Mild cardiac enlargement with mild vascular congestion. Interstitial changes in the lungs likely represent edema. Possible superimposed infiltration in the right lung base. Calcified and tortuous aorta. No pneumothorax. Left PICC catheter with tip over the low SVC region. Small bilateral pleural effusions. IMPRESSION: Cardiac enlargement with mild pulmonary vascular congestion and interstitial edema. Small pleural effusions. Possible superimposed consolidation in the right lung base. Electronically Signed   By: Lucienne Capers M.D.   On: 01/21/2016 03:09   Dg Pelvis 1-2 Views  01/21/2016  CLINICAL DATA:  Osteonecrosis of both feet. Ulcers in the feet. Decubitus ulcer to the buttocks. EXAM: PELVIS - 1-2 VIEW COMPARISON:  CT abdomen and pelvis 12/25/2014 FINDINGS: Diffuse bone demineralization. Visualization of pelvis is limited due to overlying stool. No definite evidence of any acute displaced fracture.  Degenerative changes in  the lower lumbar spine and left hip. Previous right hip arthroplasty, incompletely visualized. IMPRESSION: Diffuse bone demineralization. No acute displaced fractures. Overlying stool limits visualization. Electronically Signed   By: Lucienne Capers M.D.   On: 01/21/2016 04:36   Ct Head Wo Contrast  01/21/2016  CLINICAL DATA:  Initial valuation for acute altered mental status. Fever. EXAM: CT HEAD WITHOUT CONTRAST TECHNIQUE: Contiguous axial images were obtained from the base of the skull through the vertex without intravenous contrast. COMPARISON:  None. FINDINGS: Diffuse prominence of the CSF containing spaces is compatible with generalized cerebral atrophy. Patchy hypodensity within the periventricular and deep white matter both cerebral hemispheres most consistent with chronic small vessel ischemic disease. No acute intracranial hemorrhage. No large vessel territory infarct. No mass lesion, midline shift, or mass effect. No hydrocephalus. No extra-axial fluid collection para Scalp soft tissues within normal limits. No acute abnormality about the orbits. Paranasal sinuses are clear. No mastoid effusion. Calvarium intact. IMPRESSION: 1. No acute intracranial process. 2. Age-related cerebral atrophy with mild chronic small vessel ischemic disease. Electronically Signed   By: Jeannine Boga M.D.   On: 01/21/2016 06:56   Ct Ankle Left W Contrast  01/11/2016  CLINICAL DATA:  Multiple skin ulcerations on the left foot including a left heel ulcer in patient with peripheral vascular disease. Initial encounter. EXAM: CT OF THE LEFT FOOT WITH CONTRAST; CT OF THE LEFT ANKLE WITH CONTRAST TECHNIQUE: Multidetector CT imaging was performed following the standard protocol during bolus administration of intravenous contrast. CONTRAST:  100 mL OMNIPAQUE IOHEXOL 300 MG/ML  SOLN COMPARISON:  None. FINDINGS: Skin ulceration on the left heel is eccentric medially. Subcutaneous edema is present  about the foot. No focal fluid collection is identified. No bony destructive change or periostitis is seen. No erosion is identified. Joint spaces appear maintained. Bones are osteopenic. No tendon or ligament injury is seen. IMPRESSION: Ulceration on the left heel. The examination is negative for abscess or osteomyelitis. Negative for gout. Osteopenia. Electronically Signed   By: Inge Rise M.D.   On: 01/11/2016 15:48   US Renal  01/23/2016  CLINICAL DATA:  82 year old female with a history of acute kidney injury and diabetes EXAM: RENAL / URINARY TRACT ULTRASOUND COMPLETE COMPARISON:  CT 12/25/2014 FINDINGS: Right Kidney: Length: 8.1 cm. Echogenicity of the right kidney similar to the adjacent liver. No hydronephrosis. Flow confirmed in the hilum. Left Kidney: Length: 7.8 cm. Echogenicity of the left kidney relatively symmetric to the right. No spleen confidently visualized. No hydronephrosis.  Flow appears present at the left kidney hilum. Bladder: Appears normal for degree of bladder distention. IMPRESSION: Sonographic survey demonstrates no hydronephrosis. Signed, Dulcy Fanny. Earleen Newport, DO [facility] Electronically Signed   By: Corrie Mckusick D.O.   On: 01/23/2016 10:26   Ct Foot Left W Contrast  01/11/2016  CLINICAL DATA:  Multiple skin ulcerations on the left foot including a left heel ulcer in patient with peripheral vascular disease. Initial encounter. EXAM: CT OF THE LEFT FOOT WITH CONTRAST; CT OF THE LEFT ANKLE WITH CONTRAST TECHNIQUE: Multidetector CT imaging was performed following the standard protocol during bolus administration of intravenous contrast. CONTRAST:  100 mL OMNIPAQUE IOHEXOL 300 MG/ML  SOLN COMPARISON:  None. FINDINGS: Skin ulceration on the left heel is eccentric medially. Subcutaneous edema is present about the foot. No focal fluid collection is identified. No bony destructive change or periostitis is seen. No erosion is  identified. Joint spaces appear maintained. Bones are osteopenic. No  tendon or ligament injury is seen. IMPRESSION: Ulceration on the left heel. The examination is negative for abscess or osteomyelitis. Negative for gout. Osteopenia. Electronically Signed   By: Inge Rise M.D.   On: 01/11/2016 15:48   Dg Foot 2 Views Right  01/21/2016  CLINICAL DATA:  Ulcers to the plantar surface and toes of both feet. EXAM: RIGHT FOOT - 2 VIEW COMPARISON:  None. FINDINGS: Diffuse bone demineralization. Suggestion of bone loss along the fourth and fifth metatarsal heads may indicate osteomyelitis. MRI would be more sensitive for evaluation of osteomyelitis if clinically indicated. No acute fracture or dislocation. Soft tissue swelling most prominent over the hindfoot region. Vascular calcifications. IMPRESSION: Diffuse bone demineralization. Suggestion of bone loss over the fourth and fifth metatarsal heads which may indicate changes of osteomyelitis. Electronically Signed   By: Lucienne Capers M.D.   On: 01/21/2016 04:40   Dg Foot Complete Left  01/21/2016  CLINICAL DATA:  Ulcers of the plantar surface and toes of both feet. EXAM: LEFT FOOT - COMPLETE 3+ VIEW COMPARISON:  None. FINDINGS: Diffuse bone demineralization. No acute fracture or dislocation. No definite bone erosion or cortical changes to suggest osteomyelitis. However, MRI would be more sensitive for evaluation of osteomyelitis if clinically indicated. Vascular calcifications are present. Soft tissue swelling over the plantar aspect of the foot most prominent over the hindfoot region. IMPRESSION: Diffuse bone demineralization. No definite radiographic evidence of osteomyelitis. Electronically Signed   By: Lucienne Capers M.D.   On: 01/21/2016 04:39    Microbiology: Recent Results (from the past 240 hour(s))  Culture, blood (Routine X 2) w Reflex to ID Panel     Status: None   Collection Time: 01/21/16  4:30 AM  Result Value Ref Range Status    Specimen Description BLOOD BLOOD RIGHT HAND  Final   Special Requests BOTTLES DRAWN AEROBIC AND ANAEROBIC [place]  Final   Culture   Final    NO GROWTH 5 DAYS Performed at [facility]    Report Status 01/26/2016 FINAL  Final  Culture, blood (Routine X 2) w Reflex to ID Panel     Status: None   Collection Time: 01/21/16  4:30 AM  Result Value Ref Range Status   Specimen Description BLOOD BLOOD LEFT HAND  Final   Special Requests BOTTLES DRAWN AEROBIC AND ANAEROBIC [place]  Final   Culture   Final    NO GROWTH 5 DAYS Performed at [facility]    Report Status 01/26/2016 FINAL  Final  Urine culture     Status: None   Collection Time: 01/21/16  8:21 AM  Result Value Ref Range Status   Specimen Description URINE, CATHETERIZED  Final   Special Requests NONE  Final   Culture   Final    20,000 COLONIES/mL VANCOMYCIN RESISTANT ENTEROCOCCUS Performed at [facility]    Report Status 01/23/2016 FINAL  Final   Organism ID, Bacteria VANCOMYCIN RESISTANT ENTEROCOCCUS  Final      Susceptibility   Vancomycin resistant enterococcus - MIC*    AMPICILLIN >=32 RESISTANT Resistant     LEVOFLOXACIN >=8 RESISTANT Resistant     NITROFURANTOIN 256 RESISTANT Resistant     VANCOMYCIN >=32 RESISTANT Resistant     LINEZOLID 2 SENSITIVE Sensitive     * 20,000 COLONIES/mL VANCOMYCIN RESISTANT ENTEROCOCCUS  MRSA PCR Screening     Status: None   Collection Time: 01/21/16 12:45 PM  Result Value Ref Range Status   MRSA by PCR NEGATIVE NEGATIVE Final  Comment:        The GeneXpert MRSA Assay (FDA approved for NASAL specimens only), is one component of a comprehensive MRSA colonization surveillance program. It is not intended to diagnose MRSA infection nor to guide or monitor treatment for MRSA infections.      Labs: Basic Metabolic Panel:  Recent Labs Lab 01/25/16 0430 01/26/16 0435 01/27/16 0530 01/28/16 0403 01/29/16 0443  NA 143 138 140 145 146*  K 4.5 4.2 4.9 4.6 4.2   CL 114* 109 111 116* 114*  CO2 23 22 22 24 23   GLUCOSE 116* 121* 93 86 73  BUN 56* 56* 53* 56* 57*  CREATININE 1.86* 1.80* 1.71* 1.86* 1.80*  CALCIUM 9.5 9.7 10.2 10.1 10.2  PHOS  --   --  3.4  --   --    Liver Function Tests:  Recent Labs Lab 01/27/16 0530  ALBUMIN 1.8*   No results for input(s): LIPASE, AMYLASE in the last 168 hours. No results for input(s): AMMONIA in the last 168 hours. CBC:  Recent Labs Lab 01/23/16 0500 01/24/16 0855 01/26/16 0435 01/28/16 0403 01/28/16 1732 01/29/16 0443  WBC 13.6* 12.9* 12.9* 16.6*  --  17.5*  HGB 7.5* 7.6* 7.6* 6.6* 8.3* 8.3*  HCT 22.7* 23.0* 22.5* 20.0* 25.0* 25.1*  MCV 95.4 95.8 95.3 96.2  --  93.3  PLT 266 258 239 234  --  259   Cardiac Enzymes: No results for input(s): CKTOTAL, CKMB, CKMBINDEX, TROPONINI in the last 168 hours. BNP: BNP (last 3 results) No results for input(s): BNP in the last 8760 hours.  ProBNP (last 3 results) No results for input(s): PROBNP in the last 8760 hours.  CBG:  Recent Labs Lab 01/28/16 0803 01/28/16 1231 01/28/16 1720 01/28/16 2130 01/29/16 0755  GLUCAP 85 84 95 74 63*       Signed:  Lindsey Demonte, MD, FACP, FHM. [facility] Pager [phone]  If 7PM-7AM, please contact night-coverage [email or web address] Password [email or web address] 01/29/2016, 12:30 PM

## 2016-01-29 NOTE — Telephone Encounter (Signed)
Dr. Jenny Reichmann ok with this. Pt will call back and make an appt ( she is not a new or transfer pt, her last appt with Jenny Reichmann was 05/2015).

## 2016-01-29 NOTE — Progress Notes (Signed)
Patient's repeat glucose is 93. She is 100% on room air. Dr. Algis Liming notified.

## 2016-01-29 NOTE — Discharge Instructions (Signed)
Aspiration Pneumonia  Aspiration pneumonia is an infection in your lungs. It occurs when food, liquid, or stomach contents (vomit) are inhaled (aspirated) into your lungs. When these things get into your lungs, swelling (inflammation) and infection can occur. This can make it difficult for you to breathe. Aspiration pneumonia is a serious condition and can be life threatening. RISK FACTORS Aspiration pneumonia is more likely to occur when a person's cough (gag) reflex or ability to swallow has been decreased. Some things that can do this include:   Having a brain injury or disease, such as stroke, seizures, Parkinson's disease, dementia, or amyotrophic lateral sclerosis (ALS).   Being given general anesthetic for procedures.   Being in a coma (unconscious).   Having a narrowing of the tube that carries food to the stomach (esophagus).   Drinking too much alcohol. If a person passes out and vomits, vomit can be swallowed into the lungs.   Taking certain medicines, such as tranquilizers or sedatives.  SIGNS AND SYMPTOMS   Coughing after swallowing food or liquids.   Breathing problems, such as wheezing or shortness of breath.   Bluish skin. This can be caused by lack of oxygen.   Coughing up food or mucus. The mucus might contain blood, greenish material, or yellowish-white fluid (pus).   Fever.   Chest pain.   Being more tired than usual (fatigue).   Sweating more than usual.   Bad breath.  DIAGNOSIS  A physical exam will be done. During the exam, the health care provider will listen to your lungs with a stethoscope to check for:   Crackling sounds in the lungs.  Decreased breath sounds.  A rapid heartbeat. Various tests may be ordered. These may include:   Chest X-ray.   CT scan.   Swallowing study. This test looks at how food is swallowed and whether it goes into your breathing tube (trachea) or food pipe (esophagus).   Sputum culture. Saliva and  mucus (sputum) are collected from the lungs or the tubes that carry air to the lungs (bronchi). The sputum is then tested for bacteria.   Bronchoscopy. This test uses a flexible tube (bronchoscope) to see inside the lungs. TREATMENT  Treatment will usually include antibiotic medicines. Other medicines may also be used to reduce fever or pain. You may need to be treated in the hospital. In the hospital, your breathing will be carefully monitored. Depending on how well you are breathing, you may need to be given oxygen, or you may need breathing support from a breathing machine (ventilator). For people who fail a swallowing study, a feeding tube might be placed in the stomach, or they may be asked to avoid certain food textures or liquids when they eat. HOME CARE INSTRUCTIONS   Carefully follow any special eating instructions you were given, such as avoiding certain food textures or thickening liquids. This reduces the risk of developing aspiration pneumonia again.  Only take over-the-counter or prescription medicines as directed by your health care provider. Follow the directions carefully.   If you were prescribed antibiotics, take them as directed. Finish them even if you start to feel better.   Rest as instructed by your health care provider.   Keep all follow-up appointments with your health care provider.  SEEK MEDICAL CARE IF:   You develop worsening shortness of breath, wheezing, or difficulty breathing.   You develop a fever.   You have chest pain.  MAKE SURE YOU:   Understand these instructions.  Will watch   condition.  Will get help right away if you are not doing well or get worse.   This information is not intended to replace advice given to you by your health care provider. Make sure you discuss any questions you have with your health care provider.   Document Released: 08/14/2009 Document Revised: 10/22/2013 Document Reviewed: 04/04/2013 Elsevier  Interactive Patient Education 2016 Elsevier Inc.  Acute Kidney Injury Acute kidney injury is any condition in which there is sudden (acute) damage to the kidneys. Acute kidney injury was previously known as acute kidney failure or acute renal failure. The kidneys are two organs that lie on either side of the spine between the middle of the back and the front of the abdomen. The kidneys:  Remove wastes and extra water from the blood.   Produce important hormones. These help keep bones strong, regulate blood pressure, and help create red blood cells.   Balance the fluids and chemicals in the blood and tissues. A small amount of kidney damage may not cause problems, but a large amount of damage may make it difficult or impossible for the kidneys to work the way they should. Acute kidney injury may develop into long-lasting (chronic) kidney disease. It may also develop into a life-threatening disease called end-stage kidney disease. Acute kidney injury can get worse very quickly, so it should be treated right away. Early treatment may prevent other kidney diseases from developing. CAUSES   A problem with blood flow to the kidneys. This may be caused by:   Blood loss.   Heart disease.   Severe burns.   Liver disease.  Direct damage to the kidneys. This may be caused by:  Some medicines.   A kidney infection.   Poisoning or consuming toxic substances.   A surgical wound.   A blow to the kidney area.   A problem with urine flow. This may be caused by:   Cancer.   Kidney stones.   An enlarged prostate. SIGNS AND SYMPTOMS   Swelling (edema) of the legs, ankles, or feet.   Tiredness (lethargy).   Nausea or vomiting.   Confusion.   Problems with urination, such as:   Painful or burning feeling during urination.   Decreased urine production.   Frequent accidents in children who are potty trained.   Bloody urine.   Muscle twitches and cramps.    Shortness of breath.   Seizures.   Chest pain or pressure. Sometimes, no symptoms are present. DIAGNOSIS Acute kidney injury may be detected and diagnosed by tests, including blood, urine, imaging, or kidney biopsy tests.  TREATMENT Treatment of acute kidney injury varies depending on the cause and severity of the kidney damage. In mild cases, no treatment may be needed. The kidneys may heal on their own. If acute kidney injury is more severe, your health care provider will treat the cause of the kidney damage, help the kidneys heal, and prevent complications from occurring. Severe cases may require a procedure to remove toxic wastes from the body (dialysis) or surgery to repair kidney damage. Surgery may involve:   Repair of a torn kidney.   Removal of an obstruction. HOME CARE INSTRUCTIONS  Follow your prescribed diet.  Take medicines only as directed by your health care provider.  Do not take any new medicines (prescription, over-the-counter, or nutritional supplements) unless approved by your health care provider. Many medicines can worsen your kidney damage or may need to have the dose adjusted.   Keep all follow-up visits as  directed by your health care provider. This is important.  Observe your condition to make sure you are healing as expected. SEEK IMMEDIATE MEDICAL CARE IF:  You are feeling ill or have severe pain in the back or side.   Your symptoms return or you have new symptoms.  You have any symptoms of end-stage kidney disease. These include:   Persistent itchiness.   Loss of appetite.   Headaches.   Abnormally dark or light skin.  Numbness in the hands or feet.   Easy bruising.   Frequent hiccups.   Menstruation stops.   You have a fever.  You have increased urine production.  You have pain or bleeding when urinating. MAKE SURE YOU:   Understand these instructions.  Will watch your condition.  Will get help right away if  you are not doing well or get worse.   This information is not intended to replace advice given to you by your health care provider. Make sure you discuss any questions you have with your health care provider.   Document Released: 05/02/2011 Document Revised: 11/07/2014 Document Reviewed: 06/15/2012 Elsevier Interactive Patient Education [employer].

## 2016-01-30 DIAGNOSIS — L89893 Pressure ulcer of other site, stage 3: Secondary | ICD-10-CM | POA: Diagnosis not present

## 2016-01-30 DIAGNOSIS — I10 Essential (primary) hypertension: Secondary | ICD-10-CM | POA: Diagnosis not present

## 2016-01-30 DIAGNOSIS — M10041 Idiopathic gout, right hand: Secondary | ICD-10-CM | POA: Diagnosis not present

## 2016-01-30 DIAGNOSIS — L97429 Non-pressure chronic ulcer of left heel and midfoot with unspecified severity: Secondary | ICD-10-CM | POA: Diagnosis not present

## 2016-01-30 DIAGNOSIS — G309 Alzheimer's disease, unspecified: Secondary | ICD-10-CM | POA: Diagnosis not present

## 2016-01-30 DIAGNOSIS — K3184 Gastroparesis: Secondary | ICD-10-CM | POA: Diagnosis not present

## 2016-01-30 DIAGNOSIS — E43 Unspecified severe protein-calorie malnutrition: Secondary | ICD-10-CM | POA: Diagnosis not present

## 2016-01-30 DIAGNOSIS — E1152 Type 2 diabetes mellitus with diabetic peripheral angiopathy with gangrene: Secondary | ICD-10-CM | POA: Diagnosis not present

## 2016-01-30 DIAGNOSIS — J690 Pneumonitis due to inhalation of food and vomit: Secondary | ICD-10-CM | POA: Diagnosis not present

## 2016-01-30 DIAGNOSIS — E1143 Type 2 diabetes mellitus with diabetic autonomic (poly)neuropathy: Secondary | ICD-10-CM | POA: Diagnosis not present

## 2016-01-30 DIAGNOSIS — R1310 Dysphagia, unspecified: Secondary | ICD-10-CM | POA: Diagnosis not present

## 2016-01-30 DIAGNOSIS — L89153 Pressure ulcer of sacral region, stage 3: Secondary | ICD-10-CM | POA: Diagnosis not present

## 2016-02-01 ENCOUNTER — Telehealth: Payer: Self-pay | Admitting: Internal Medicine

## 2016-02-01 DIAGNOSIS — M10041 Idiopathic gout, right hand: Secondary | ICD-10-CM | POA: Diagnosis not present

## 2016-02-01 DIAGNOSIS — E1143 Type 2 diabetes mellitus with diabetic autonomic (poly)neuropathy: Secondary | ICD-10-CM | POA: Diagnosis not present

## 2016-02-01 DIAGNOSIS — L89893 Pressure ulcer of other site, stage 3: Secondary | ICD-10-CM | POA: Diagnosis not present

## 2016-02-01 DIAGNOSIS — E43 Unspecified severe protein-calorie malnutrition: Secondary | ICD-10-CM | POA: Diagnosis not present

## 2016-02-01 DIAGNOSIS — K3184 Gastroparesis: Secondary | ICD-10-CM | POA: Diagnosis not present

## 2016-02-01 DIAGNOSIS — E1152 Type 2 diabetes mellitus with diabetic peripheral angiopathy with gangrene: Secondary | ICD-10-CM | POA: Diagnosis not present

## 2016-02-01 DIAGNOSIS — L89153 Pressure ulcer of sacral region, stage 3: Secondary | ICD-10-CM | POA: Diagnosis not present

## 2016-02-01 DIAGNOSIS — G309 Alzheimer's disease, unspecified: Secondary | ICD-10-CM | POA: Diagnosis not present

## 2016-02-01 DIAGNOSIS — J690 Pneumonitis due to inhalation of food and vomit: Secondary | ICD-10-CM | POA: Diagnosis not present

## 2016-02-01 DIAGNOSIS — R1310 Dysphagia, unspecified: Secondary | ICD-10-CM | POA: Diagnosis not present

## 2016-02-01 DIAGNOSIS — L97429 Non-pressure chronic ulcer of left heel and midfoot with unspecified severity: Secondary | ICD-10-CM | POA: Diagnosis not present

## 2016-02-01 DIAGNOSIS — I10 Essential (primary) hypertension: Secondary | ICD-10-CM | POA: Diagnosis not present

## 2016-02-01 NOTE — Telephone Encounter (Signed)
Please advise 

## 2016-02-01 NOTE — Telephone Encounter (Signed)
Pt daughter called in said that pt does not have any more bp meds and wants to know if pt is going to be ok without till till she can get in for hosp or is there anyway it can be filled.  .  Dr Jenny Reichmann is not in today and pt is completely out she they need to know today.

## 2016-02-01 NOTE — Telephone Encounter (Signed)
Ok to send one month refill - needs to schedule f/u with Dr. Jenny Reichmann

## 2016-02-01 NOTE — Telephone Encounter (Signed)
Going to adminisiter wound care 1 week one and 3 week eight .  Will spell out wound care upon call back.  States patient did have any medications from facility.  States daughter is going to schedule appointment for patient.

## 2016-02-01 NOTE — Telephone Encounter (Signed)
Spoke with pts daughter, she stated that she just got off the phone with the pharmacy and they could not figure out a blood pressure medication that she had recently been prescribed. Pt had recently been in the nursing home for 7 months before her hospital visit. She stated she would call the nursing home to see if they were giving any blood pressure meds to the pt. PT was in the home at the time of the call and the blood pressure they had taken was 140/82. Pts daughter was instructed to monitor the BP and this could be discussed further at appt on Wed. Please advise if there is anything that should be done before appt on Wed.

## 2016-02-02 NOTE — Telephone Encounter (Signed)
Spoke with daughter to inform of MDs response.

## 2016-02-02 NOTE — Telephone Encounter (Signed)
Ok to see on wed

## 2016-02-03 ENCOUNTER — Telehealth: Payer: Self-pay | Admitting: Vascular Surgery

## 2016-02-03 ENCOUNTER — Inpatient Hospital Stay: Payer: Self-pay | Admitting: Internal Medicine

## 2016-02-03 DIAGNOSIS — M10041 Idiopathic gout, right hand: Secondary | ICD-10-CM | POA: Diagnosis not present

## 2016-02-03 DIAGNOSIS — L97429 Non-pressure chronic ulcer of left heel and midfoot with unspecified severity: Secondary | ICD-10-CM | POA: Diagnosis not present

## 2016-02-03 DIAGNOSIS — L89153 Pressure ulcer of sacral region, stage 3: Secondary | ICD-10-CM | POA: Diagnosis not present

## 2016-02-03 DIAGNOSIS — E1152 Type 2 diabetes mellitus with diabetic peripheral angiopathy with gangrene: Secondary | ICD-10-CM | POA: Diagnosis not present

## 2016-02-03 DIAGNOSIS — G309 Alzheimer's disease, unspecified: Secondary | ICD-10-CM | POA: Diagnosis not present

## 2016-02-03 DIAGNOSIS — E43 Unspecified severe protein-calorie malnutrition: Secondary | ICD-10-CM | POA: Diagnosis not present

## 2016-02-03 DIAGNOSIS — K3184 Gastroparesis: Secondary | ICD-10-CM | POA: Diagnosis not present

## 2016-02-03 DIAGNOSIS — R1310 Dysphagia, unspecified: Secondary | ICD-10-CM | POA: Diagnosis not present

## 2016-02-03 DIAGNOSIS — L89893 Pressure ulcer of other site, stage 3: Secondary | ICD-10-CM | POA: Diagnosis not present

## 2016-02-03 DIAGNOSIS — I10 Essential (primary) hypertension: Secondary | ICD-10-CM | POA: Diagnosis not present

## 2016-02-03 DIAGNOSIS — J690 Pneumonitis due to inhalation of food and vomit: Secondary | ICD-10-CM | POA: Diagnosis not present

## 2016-02-03 DIAGNOSIS — E1143 Type 2 diabetes mellitus with diabetic autonomic (poly)neuropathy: Secondary | ICD-10-CM | POA: Diagnosis not present

## 2016-02-03 NOTE — Telephone Encounter (Signed)
-----   Message from Denman George, RN sent at 02/02/2016  2:23 PM EDT ----- Regarding: needs hosp. f/u appt. with [facility] Contact: [phone] Pt's daughter, Hulda Marin, called to make a hospital f/u appt. With Dr. Scot Dock.  Stated she was instructed upon pt's discharge 3/31, to schedule a hosp. F/u appt. With Dr. Scot Dock, as soon as possible.  She as POA for her mother, and would like you to schedule the appt. through her.  Pt has hx of nonhealing wounds bilat feet with PVD.

## 2016-02-03 NOTE — Telephone Encounter (Signed)
Sched for next avail as per Arbie Cookey. Appt 02/24/16 at 10:15. Spoke to pt's daughter Hulda Marin to sch appt.

## 2016-02-04 DIAGNOSIS — M109 Gout, unspecified: Secondary | ICD-10-CM | POA: Diagnosis not present

## 2016-02-04 DIAGNOSIS — E1165 Type 2 diabetes mellitus with hyperglycemia: Secondary | ICD-10-CM | POA: Diagnosis not present

## 2016-02-04 DIAGNOSIS — Z9181 History of falling: Secondary | ICD-10-CM | POA: Diagnosis not present

## 2016-02-05 ENCOUNTER — Other Ambulatory Visit (INDEPENDENT_AMBULATORY_CARE_PROVIDER_SITE_OTHER): Payer: Medicare Other

## 2016-02-05 ENCOUNTER — Encounter: Payer: Self-pay | Admitting: Internal Medicine

## 2016-02-05 ENCOUNTER — Ambulatory Visit (INDEPENDENT_AMBULATORY_CARE_PROVIDER_SITE_OTHER): Payer: Medicare Other | Admitting: Internal Medicine

## 2016-02-05 VITALS — BP 128/60 | HR 64 | Temp 97.6°F | Ht 66.0 in

## 2016-02-05 DIAGNOSIS — G894 Chronic pain syndrome: Secondary | ICD-10-CM

## 2016-02-05 DIAGNOSIS — E43 Unspecified severe protein-calorie malnutrition: Secondary | ICD-10-CM | POA: Diagnosis not present

## 2016-02-05 DIAGNOSIS — M10041 Idiopathic gout, right hand: Secondary | ICD-10-CM

## 2016-02-05 DIAGNOSIS — E1143 Type 2 diabetes mellitus with diabetic autonomic (poly)neuropathy: Secondary | ICD-10-CM | POA: Diagnosis not present

## 2016-02-05 DIAGNOSIS — I10 Essential (primary) hypertension: Secondary | ICD-10-CM

## 2016-02-05 DIAGNOSIS — K3184 Gastroparesis: Secondary | ICD-10-CM | POA: Diagnosis not present

## 2016-02-05 DIAGNOSIS — G309 Alzheimer's disease, unspecified: Secondary | ICD-10-CM

## 2016-02-05 DIAGNOSIS — J690 Pneumonitis due to inhalation of food and vomit: Secondary | ICD-10-CM | POA: Diagnosis not present

## 2016-02-05 DIAGNOSIS — L89893 Pressure ulcer of other site, stage 3: Secondary | ICD-10-CM | POA: Diagnosis not present

## 2016-02-05 DIAGNOSIS — L89153 Pressure ulcer of sacral region, stage 3: Secondary | ICD-10-CM | POA: Diagnosis not present

## 2016-02-05 DIAGNOSIS — F0280 Dementia in other diseases classified elsewhere without behavioral disturbance: Secondary | ICD-10-CM

## 2016-02-05 DIAGNOSIS — L97429 Non-pressure chronic ulcer of left heel and midfoot with unspecified severity: Secondary | ICD-10-CM | POA: Diagnosis not present

## 2016-02-05 DIAGNOSIS — R1310 Dysphagia, unspecified: Secondary | ICD-10-CM | POA: Diagnosis not present

## 2016-02-05 DIAGNOSIS — E1152 Type 2 diabetes mellitus with diabetic peripheral angiopathy with gangrene: Secondary | ICD-10-CM | POA: Diagnosis not present

## 2016-02-05 LAB — CBC WITH DIFFERENTIAL/PLATELET
BASOS ABS: 0 10*3/uL (ref 0.0–0.1)
BASOS PCT: 0.1 % (ref 0.0–3.0)
EOS ABS: 0.3 10*3/uL (ref 0.0–0.7)
Eosinophils Relative: 3 % (ref 0.0–5.0)
HCT: 31.6 % — ABNORMAL LOW (ref 36.0–46.0)
Hemoglobin: 10.4 g/dL — ABNORMAL LOW (ref 12.0–15.0)
LYMPHS ABS: 1.5 10*3/uL (ref 0.7–4.0)
LYMPHS PCT: 14.6 % (ref 12.0–46.0)
MCHC: 33 g/dL (ref 30.0–36.0)
MCV: 93.8 fl (ref 78.0–100.0)
MONOS PCT: 7.4 % (ref 3.0–12.0)
Monocytes Absolute: 0.8 10*3/uL (ref 0.1–1.0)
NEUTROS ABS: 7.7 10*3/uL (ref 1.4–7.7)
NEUTROS PCT: 74.9 % (ref 43.0–77.0)
PLATELETS: 344 10*3/uL (ref 150.0–400.0)
RBC: 3.37 Mil/uL — AB (ref 3.87–5.11)
RDW: 21 % — ABNORMAL HIGH (ref 11.5–15.5)
WBC: 10.3 10*3/uL (ref 4.0–10.5)

## 2016-02-05 LAB — BASIC METABOLIC PANEL
BUN: 30 mg/dL — ABNORMAL HIGH (ref 6–23)
CHLORIDE: 107 meq/L (ref 96–112)
CO2: 28 meq/L (ref 19–32)
Calcium: 10 mg/dL (ref 8.4–10.5)
Creatinine, Ser: 1.4 mg/dL — ABNORMAL HIGH (ref 0.40–1.20)
GFR: 45.73 mL/min — ABNORMAL LOW (ref >60.00)
Glucose, Bld: 65 mg/dL — ABNORMAL LOW (ref 70–99)
POTASSIUM: 3.8 meq/L (ref 3.5–5.1)
SODIUM: 146 meq/L — AB (ref 135–145)

## 2016-02-05 MED ORDER — LATANOPROST 0.005 % OP SOLN: 1.0000 [drp] | Freq: Every day | OPHTHALMIC | Status: DC

## 2016-02-05 MED ORDER — COLCHICINE 0.6 MG PO TABS: 0.6000 mg | ORAL_TABLET | Freq: Every day | ORAL | Status: DC | PRN

## 2016-02-05 MED ORDER — DONEPEZIL HCL 10 MG PO TABS: 10.0000 mg | ORAL_TABLET | Freq: Every day | ORAL | Status: DC

## 2016-02-05 MED ORDER — FEBUXOSTAT 40 MG PO TABS: 40.0000 mg | ORAL_TABLET | Freq: Every day | ORAL | Status: DC

## 2016-02-05 MED ORDER — OMEPRAZOLE 40 MG PO CPDR: 40.0000 mg | DELAYED_RELEASE_CAPSULE | Freq: Every day | ORAL | Status: DC

## 2016-02-05 MED ORDER — MEMANTINE HCL ER 28 MG PO CP24: 28.0000 mg | ORAL_CAPSULE | Freq: Every day | ORAL | Status: DC

## 2016-02-05 NOTE — Assessment & Plan Note (Signed)
[place] for tylenol 650 mg every 8 hrs,  to f/u any worsening symptoms or concerns

## 2016-02-05 NOTE — Assessment & Plan Note (Signed)
No specific joint pain or swelling, meds refilled for prevention,  to f/u any worsening symptoms or concerns

## 2016-02-05 NOTE — Progress Notes (Signed)
Pre visit review using our clinic review tool, if applicable. No additional management support is needed unless otherwise documented below in the visit note. 

## 2016-02-05 NOTE — Patient Instructions (Addendum)
OK to stay off the remeron  OK to take the ES tylenol 650 mg - 1 every 8 hrs as needed  Please continue all other medications as before, and refills have been done if requested.  Please have the pharmacy call with any other refills you may need.  Please continue your efforts at being more active, low cholesterol diet, and weight control.  You are otherwise up to date with prevention measures today.  Please keep your appointments with your specialists as you may have planned  Please go to the LAB in the Basement (turn left off the elevator) for the tests to be done today  You will be contacted by phone if any changes need to be made immediately.  Otherwise, you will receive a letter about your results with an explanation, but please check with MyChart first.  Please remember to sign up for MyChart if you have not done so, as this will be important to you in the future with finding out test results, communicating by private email, and scheduling acute appointments online when needed.  Please return in 6 months, or sooner if needed

## 2016-02-05 NOTE — Assessment & Plan Note (Addendum)
With gradual worsening, now essentially bedbound/chair, total care, not likley to benefit from lift chair, but maybe from hoyer lift, will wait for [facility] nursing eval recommendation, may need OT eval, all meds re-started today  Note:  Total time for pt hx, exam, review of record with pt in the room, determination of diagnoses and plan for further eval and tx is > 40 min, with over 50% spent in coordination and counseling of patient

## 2016-02-05 NOTE — Assessment & Plan Note (Signed)
stable overall by history and exam, recent data reviewed with pt, and pt to continue medical treatment as before,  to f/u any worsening symptoms or concerns BP Readings from Last 3 Encounters:  02/05/16 128/60  01/29/16 173/62  01/14/16 134/59

## 2016-02-05 NOTE — Progress Notes (Signed)
Subjective:    Patient ID: Shari Mcmahon, female    DOB: 10/25/1927, 82 y.o.   MRN: [id number]  HPI  Here to f/u recent hospn mar 21-31 then NH rehab stay.  Tx for aspiration pna, AKI, dementia, TME, HTN, DM, periph neuropathy, chronic pain and pressure ulcer left heel and mid foot.  Pt was designated DNR, palliative care consulted and it was d/w family regarding likely poor prognosis overall, and to expect likely slow decline.  Pt was taken off remeron 7.5 due to sedation at the NH and much improved per daughter.  Now home for 1 wk, but unfort with no prescriptions nd has not taken any meds at home/needs all refills.   Has Nurse, wound care, and aide coming to the home soon  Also To f/u Dr Scot Dock apr 48.  Does C/o mild persistent diffuse pain to the legs, not taking any pain meds.  No current gout symptoms of swelling joint.  Dementia overall stable symptomatically, and not assoc with behavioral changes such as hallucinations, paranoia, or agitation.   Pt denies chest pain, increased sob or doe, wheezing, orthopnea, PND, increased LE swelling, palpitations, dizziness or syncope.   Pt denies polydipsia, polyuria, Pt denies new neurological symptoms such as new headache, or facial or extremity weakness. Past Medical History  Diagnosis Date  . ABDOMINAL PAIN, CHRONIC 04/07/2008  . ARTHRITIS 03/03/2008  . BRADYCARDIA, CHRONIC 12/17/2008  . CARDIAC MURMUR 02/01/2010  . CONSTIPATION, CHRONIC 10/15/2010  . DEGENERATIVE JOINT DISEASE, KNEES, BILATERAL 12/07/2007  . DEPRESSION 06/17/2009  . DIABETES MELLITUS, TYPE II 09/19/2007    DIET CONTROL   . DYSPHAGIA UNSPECIFIED 12/24/2009  . Gastroparesis 12/24/2009  . GENERALIZED OSTEOARTHROSIS INVOLVING HAND 10/03/2007  . GLAUCOMA 09/19/2007  . GOUTY ARTHROPATHY UNSPECIFIED 02/17/2009  . GOUT 09/19/2007  . HYPERLIPIDEMIA 01/27/2010  . HYPERTENSION 09/19/2007  . LUNG NODULE 03/03/2008  . MENOPAUSAL DISORDER 12/17/2008  . PERIPHERAL NEUROPATHY 06/19/2008  . PERSONAL  HISTORY MALIGNANT NEOPLASM STOMACH 09/19/2007  . 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 STOMACH CANCER 03/03/2008  . UTI 12/15/2009  . Arthritis of knee, degenerative 10/19/2011   Past Surgical History  Procedure Laterality Date  . Vagotomy    . Partial gastrectomy    . Billroth ii gastorenterostomy    . Colonoscopy      reports that she has quit smoking. Her smoking use included Cigarettes. She has never used smokeless tobacco. She reports that she does not drink alcohol or use illicit drugs. family history includes Cancer in her sister; Colon cancer in her maternal grandmother and paternal grandfather; Diabetes in her other; Prostate cancer in her brother; Stroke in her father; Uterine cancer in her mother. There is no history of Stomach cancer. Allergies  Allergen Reactions  . Voltaren [Diclofenac Sodium] Other (See Comments)    Stomach irritation  . Dulcolax [Bisacodyl]     Unknown   . Duloxetine Hcl Other (See Comments)    Made patient not want to eat or drink  . Penicillins Swelling    Unknown   . Timolol     REACTION: bradycardia worse   Current Outpatient Prescriptions on File Prior to Visit  Medication Sig Dispense Refill  . acetaminophen (TYLENOL) 325 MG tablet Take 2 tablets (650 mg total) by mouth every 6 (six) hours as needed for mild pain, moderate pain, fever or headache. (Patient not taking: Reported on 02/05/2016)    . Amino Acids-Protein Hydrolys (FEEDING SUPPLEMENT, PRO-STAT SUGAR FREE 64,) LIQD Take  30 mLs by mouth 3 (three) times daily with meals. (Patient not taking: Reported on 02/05/2016) 900 mL prn  . feeding supplement, ENSURE ENLIVE, (ENSURE ENLIVE) LIQD Take 237 mLs by mouth 2 (two) times daily between meals.    . lactose free nutrition (BOOST PLUS) LIQD Take 237 mLs by mouth 3 (three) times daily between meals. (Patient not taking: Reported on 02/05/2016)    . Multiple Vitamin tablet Take 1 tablet by mouth daily. Reported on 02/05/2016    .  senna (SENOKOT) 8.6 MG tablet Take 1 tablet by mouth 2 (two) times daily. Reported on 02/05/2016    . vitamin C (ASCORBIC ACID) 500 MG tablet Take 500 mg by mouth 2 (two) times daily. Reported on 02/05/2016     No current facility-administered medications on file prior to visit.    Review of Systems Unable due to dementia    Objective:   Physical Exam BP 128/60 mmHg  Pulse 64  Temp(Src) 97.6 F (36.4 C) (Oral)  Ht 5\' 6"  (1.676 m)  Wt   SpO2 97% VS noted,  Constitutional: Pt appears in no apparent distress HENT: Head: NCAT.  Right Ear: External ear normal.  Left Ear: External ear normal.  Eyes: . Pupils are equal, round, and reactive to light. Conjunctivae and EOM are normal Neck: Normal range of motion. Neck supple.  Cardiovascular: Normal rate and regular rhythm.   Pulmonary/Chest: Effort normal and breath sounds without rales or wheezing.  Abd:  Soft, NT, ND, + BS Neurological: Pt is alert. + baseline confused , motor grossly intact Skin: Skin is warm. No rash, no LE edema, has very small 1 cm superfical wound to mid lateral right leg, has dry gangrene to mult toes bilat Psychiatric: Pt behavior is normal. No agitation.      Assessment & Plan:

## 2016-02-08 DIAGNOSIS — R1310 Dysphagia, unspecified: Secondary | ICD-10-CM | POA: Diagnosis not present

## 2016-02-08 DIAGNOSIS — G309 Alzheimer's disease, unspecified: Secondary | ICD-10-CM | POA: Diagnosis not present

## 2016-02-08 DIAGNOSIS — L89153 Pressure ulcer of sacral region, stage 3: Secondary | ICD-10-CM | POA: Diagnosis not present

## 2016-02-08 DIAGNOSIS — L89893 Pressure ulcer of other site, stage 3: Secondary | ICD-10-CM | POA: Diagnosis not present

## 2016-02-08 DIAGNOSIS — E43 Unspecified severe protein-calorie malnutrition: Secondary | ICD-10-CM | POA: Diagnosis not present

## 2016-02-08 DIAGNOSIS — K3184 Gastroparesis: Secondary | ICD-10-CM | POA: Diagnosis not present

## 2016-02-08 DIAGNOSIS — E1152 Type 2 diabetes mellitus with diabetic peripheral angiopathy with gangrene: Secondary | ICD-10-CM | POA: Diagnosis not present

## 2016-02-08 DIAGNOSIS — L97429 Non-pressure chronic ulcer of left heel and midfoot with unspecified severity: Secondary | ICD-10-CM | POA: Diagnosis not present

## 2016-02-08 DIAGNOSIS — M10041 Idiopathic gout, right hand: Secondary | ICD-10-CM | POA: Diagnosis not present

## 2016-02-08 DIAGNOSIS — E1143 Type 2 diabetes mellitus with diabetic autonomic (poly)neuropathy: Secondary | ICD-10-CM | POA: Diagnosis not present

## 2016-02-08 DIAGNOSIS — I10 Essential (primary) hypertension: Secondary | ICD-10-CM | POA: Diagnosis not present

## 2016-02-08 DIAGNOSIS — J690 Pneumonitis due to inhalation of food and vomit: Secondary | ICD-10-CM | POA: Diagnosis not present

## 2016-02-09 ENCOUNTER — Telehealth: Payer: Self-pay | Admitting: Internal Medicine

## 2016-02-09 DIAGNOSIS — G309 Alzheimer's disease, unspecified: Secondary | ICD-10-CM | POA: Diagnosis not present

## 2016-02-09 DIAGNOSIS — L97429 Non-pressure chronic ulcer of left heel and midfoot with unspecified severity: Secondary | ICD-10-CM | POA: Diagnosis not present

## 2016-02-09 DIAGNOSIS — K3184 Gastroparesis: Secondary | ICD-10-CM | POA: Diagnosis not present

## 2016-02-09 DIAGNOSIS — M10041 Idiopathic gout, right hand: Secondary | ICD-10-CM | POA: Diagnosis not present

## 2016-02-09 DIAGNOSIS — I10 Essential (primary) hypertension: Secondary | ICD-10-CM | POA: Diagnosis not present

## 2016-02-09 DIAGNOSIS — L89893 Pressure ulcer of other site, stage 3: Secondary | ICD-10-CM | POA: Diagnosis not present

## 2016-02-09 DIAGNOSIS — E43 Unspecified severe protein-calorie malnutrition: Secondary | ICD-10-CM | POA: Diagnosis not present

## 2016-02-09 DIAGNOSIS — E1143 Type 2 diabetes mellitus with diabetic autonomic (poly)neuropathy: Secondary | ICD-10-CM | POA: Diagnosis not present

## 2016-02-09 DIAGNOSIS — R1310 Dysphagia, unspecified: Secondary | ICD-10-CM | POA: Diagnosis not present

## 2016-02-09 DIAGNOSIS — E1152 Type 2 diabetes mellitus with diabetic peripheral angiopathy with gangrene: Secondary | ICD-10-CM | POA: Diagnosis not present

## 2016-02-09 DIAGNOSIS — L89153 Pressure ulcer of sacral region, stage 3: Secondary | ICD-10-CM | POA: Diagnosis not present

## 2016-02-09 DIAGNOSIS — J690 Pneumonitis due to inhalation of food and vomit: Secondary | ICD-10-CM | POA: Diagnosis not present

## 2016-02-09 NOTE — Telephone Encounter (Signed)
[place]    --------------------------------------------------------------------------------   Patient Name: Shari Mcmahon  Gender: Female  DOB: 1927/03/31   Age: 82 Y 2 M 11 D  Return Phone Number: [phone] (Primary)  Address:     City/State/Zip:  [place]     Client [facility] - Client  Client Site [facility] - Day  Physician Cathlean Cower   Contact Type Call  Who Is Calling Patient / Member / Family / Caregiver  Call Type Triage / Clinical  Caller Name Taletha Bissinger  Relationship To Patient Daughter  Return Phone Number [phone] (Primary)  Chief Complaint Blood Pressure High  Reason for Call Symptomatic / Request for [name] states her mother hasn't been taking blood pressure medication ( doctor took her off of it since she has lost weight). She has lost weight and her blood pressure is 177/90 taken with the digital machine, taken manually it was 140/60. Wanting to let the doctor know.   Appointment Disposition EMR Appointment Not Necessary  Info pasted into Epic Yes  PreDisposition Call Doctor  Translation No       Nurse Assessment  Nurse: Amalia Hailey, RN, Melissa Date/Time (Eastern Time): 02/09/2016 4:50:24 PM  Confirm and document reason for call. If symptomatic, describe symptoms. You must click the next button to save text entered. ---Caller states her mother hasn't been taking blood pressure medication. She has lost weight and her blood pressure is 177/90. Wanting to let the doctor know.    Has the patient traveled out of the country within the last 30 days? ---Not Applicable    Does the patient have any new or worsening symptoms? ---Yes    Will a triage be completed? ---Yes    Related visit to physician within the last 2 weeks? ---No    Does the PT have any chronic conditions? (i.e. diabetes, asthma, etc.)  ---Yes    List chronic conditions. ---hypertension, Diabetic, 9 days a week ago was hospitalized for sepsis.    Is this a behavioral health or substance abuse call? ---No           Guidelines          Guideline Title Affirmed Question Affirmed Notes Nurse Date/Time (Eastern Time)  High Blood Pressure BP 120-139 / 80-89 (all triage questions negative)    Amalia Hailey, RN, Melissa 02/09/2016 4:55:06 PM    Disp. Time [name] Time) Disposition Final User         02/09/2016 4:56:42 PM Home Care Yes Amalia Hailey, RN, Lenna Sciara            Caller Understands: Yes  Disagree/Comply: Comply       Care Advice Given Per Guideline        HOME CARE: You should be able to treat this at home. CALL BACK IF: * You become worse. * Your blood pressure is ? 140/90        --------------------------------------------------------------------------------         Comments  User: Colin Ina, RN Date/Time [name] Time): 02/09/2016 4:54:55 PM  Manual BP 140/60 checked after taking the BP taken in the digital BP, got the manuel to double check. Home Health nurse to come out tomorrow to check her-will come three times a week, Physical Therapy comes twice weekly. Caller advised will let the physician know about this.

## 2016-02-10 ENCOUNTER — Telehealth: Payer: Self-pay | Admitting: Internal Medicine

## 2016-02-10 DIAGNOSIS — G309 Alzheimer's disease, unspecified: Secondary | ICD-10-CM | POA: Diagnosis not present

## 2016-02-10 DIAGNOSIS — K3184 Gastroparesis: Secondary | ICD-10-CM | POA: Diagnosis not present

## 2016-02-10 DIAGNOSIS — E1152 Type 2 diabetes mellitus with diabetic peripheral angiopathy with gangrene: Secondary | ICD-10-CM | POA: Diagnosis not present

## 2016-02-10 DIAGNOSIS — E43 Unspecified severe protein-calorie malnutrition: Secondary | ICD-10-CM | POA: Diagnosis not present

## 2016-02-10 DIAGNOSIS — J690 Pneumonitis due to inhalation of food and vomit: Secondary | ICD-10-CM | POA: Diagnosis not present

## 2016-02-10 DIAGNOSIS — R1310 Dysphagia, unspecified: Secondary | ICD-10-CM | POA: Diagnosis not present

## 2016-02-10 DIAGNOSIS — E1143 Type 2 diabetes mellitus with diabetic autonomic (poly)neuropathy: Secondary | ICD-10-CM | POA: Diagnosis not present

## 2016-02-10 DIAGNOSIS — L89893 Pressure ulcer of other site, stage 3: Secondary | ICD-10-CM | POA: Diagnosis not present

## 2016-02-10 DIAGNOSIS — L97429 Non-pressure chronic ulcer of left heel and midfoot with unspecified severity: Secondary | ICD-10-CM | POA: Diagnosis not present

## 2016-02-10 DIAGNOSIS — I10 Essential (primary) hypertension: Secondary | ICD-10-CM | POA: Diagnosis not present

## 2016-02-10 DIAGNOSIS — L89153 Pressure ulcer of sacral region, stage 3: Secondary | ICD-10-CM | POA: Diagnosis not present

## 2016-02-10 DIAGNOSIS — M10041 Idiopathic gout, right hand: Secondary | ICD-10-CM | POA: Diagnosis not present

## 2016-02-10 MED ORDER — AMLODIPINE BESYLATE 5 MG PO TABS: 5.0000 mg | ORAL_TABLET | Freq: Every day | ORAL | Status: DC

## 2016-02-10 NOTE — Telephone Encounter (Signed)
Ok for start amlod 5 qd - done erx

## 2016-02-10 NOTE — Telephone Encounter (Signed)
Please advise on what patient should do?

## 2016-02-10 NOTE — Telephone Encounter (Signed)
Shari Mcmahon  called in said that bp is still high today  172/78

## 2016-02-11 DIAGNOSIS — E43 Unspecified severe protein-calorie malnutrition: Secondary | ICD-10-CM | POA: Diagnosis not present

## 2016-02-11 DIAGNOSIS — J690 Pneumonitis due to inhalation of food and vomit: Secondary | ICD-10-CM | POA: Diagnosis not present

## 2016-02-11 DIAGNOSIS — L97429 Non-pressure chronic ulcer of left heel and midfoot with unspecified severity: Secondary | ICD-10-CM | POA: Diagnosis not present

## 2016-02-11 DIAGNOSIS — E1152 Type 2 diabetes mellitus with diabetic peripheral angiopathy with gangrene: Secondary | ICD-10-CM | POA: Diagnosis not present

## 2016-02-11 DIAGNOSIS — K3184 Gastroparesis: Secondary | ICD-10-CM | POA: Diagnosis not present

## 2016-02-11 DIAGNOSIS — L89153 Pressure ulcer of sacral region, stage 3: Secondary | ICD-10-CM | POA: Diagnosis not present

## 2016-02-11 DIAGNOSIS — G309 Alzheimer's disease, unspecified: Secondary | ICD-10-CM | POA: Diagnosis not present

## 2016-02-11 DIAGNOSIS — L89893 Pressure ulcer of other site, stage 3: Secondary | ICD-10-CM | POA: Diagnosis not present

## 2016-02-11 DIAGNOSIS — E1143 Type 2 diabetes mellitus with diabetic autonomic (poly)neuropathy: Secondary | ICD-10-CM | POA: Diagnosis not present

## 2016-02-11 DIAGNOSIS — M10041 Idiopathic gout, right hand: Secondary | ICD-10-CM | POA: Diagnosis not present

## 2016-02-11 DIAGNOSIS — I10 Essential (primary) hypertension: Secondary | ICD-10-CM | POA: Diagnosis not present

## 2016-02-11 DIAGNOSIS — R1310 Dysphagia, unspecified: Secondary | ICD-10-CM | POA: Diagnosis not present

## 2016-02-11 NOTE — Telephone Encounter (Signed)
Called pt daughter no answer LMOM w/MD response. Rx has been sent to pharmacy...Johny Chess

## 2016-02-12 DIAGNOSIS — I10 Essential (primary) hypertension: Secondary | ICD-10-CM | POA: Diagnosis not present

## 2016-02-12 DIAGNOSIS — L89893 Pressure ulcer of other site, stage 3: Secondary | ICD-10-CM | POA: Diagnosis not present

## 2016-02-12 DIAGNOSIS — E43 Unspecified severe protein-calorie malnutrition: Secondary | ICD-10-CM | POA: Diagnosis not present

## 2016-02-12 DIAGNOSIS — G309 Alzheimer's disease, unspecified: Secondary | ICD-10-CM | POA: Diagnosis not present

## 2016-02-12 DIAGNOSIS — R1310 Dysphagia, unspecified: Secondary | ICD-10-CM | POA: Diagnosis not present

## 2016-02-12 DIAGNOSIS — E1143 Type 2 diabetes mellitus with diabetic autonomic (poly)neuropathy: Secondary | ICD-10-CM | POA: Diagnosis not present

## 2016-02-12 DIAGNOSIS — K3184 Gastroparesis: Secondary | ICD-10-CM | POA: Diagnosis not present

## 2016-02-12 DIAGNOSIS — E1152 Type 2 diabetes mellitus with diabetic peripheral angiopathy with gangrene: Secondary | ICD-10-CM | POA: Diagnosis not present

## 2016-02-12 DIAGNOSIS — J690 Pneumonitis due to inhalation of food and vomit: Secondary | ICD-10-CM | POA: Diagnosis not present

## 2016-02-12 DIAGNOSIS — L89153 Pressure ulcer of sacral region, stage 3: Secondary | ICD-10-CM | POA: Diagnosis not present

## 2016-02-12 DIAGNOSIS — L97429 Non-pressure chronic ulcer of left heel and midfoot with unspecified severity: Secondary | ICD-10-CM | POA: Diagnosis not present

## 2016-02-12 DIAGNOSIS — M10041 Idiopathic gout, right hand: Secondary | ICD-10-CM | POA: Diagnosis not present

## 2016-02-15 ENCOUNTER — Telehealth: Payer: Self-pay

## 2016-02-15 ENCOUNTER — Telehealth: Payer: Self-pay | Admitting: Internal Medicine

## 2016-02-15 DIAGNOSIS — L97429 Non-pressure chronic ulcer of left heel and midfoot with unspecified severity: Secondary | ICD-10-CM | POA: Diagnosis not present

## 2016-02-15 DIAGNOSIS — J690 Pneumonitis due to inhalation of food and vomit: Secondary | ICD-10-CM | POA: Diagnosis not present

## 2016-02-15 DIAGNOSIS — G309 Alzheimer's disease, unspecified: Secondary | ICD-10-CM | POA: Diagnosis not present

## 2016-02-15 DIAGNOSIS — E1152 Type 2 diabetes mellitus with diabetic peripheral angiopathy with gangrene: Secondary | ICD-10-CM | POA: Diagnosis not present

## 2016-02-15 DIAGNOSIS — R1310 Dysphagia, unspecified: Secondary | ICD-10-CM | POA: Diagnosis not present

## 2016-02-15 DIAGNOSIS — E1143 Type 2 diabetes mellitus with diabetic autonomic (poly)neuropathy: Secondary | ICD-10-CM | POA: Diagnosis not present

## 2016-02-15 DIAGNOSIS — L89153 Pressure ulcer of sacral region, stage 3: Secondary | ICD-10-CM | POA: Diagnosis not present

## 2016-02-15 DIAGNOSIS — K3184 Gastroparesis: Secondary | ICD-10-CM | POA: Diagnosis not present

## 2016-02-15 DIAGNOSIS — E43 Unspecified severe protein-calorie malnutrition: Secondary | ICD-10-CM | POA: Diagnosis not present

## 2016-02-15 DIAGNOSIS — M10041 Idiopathic gout, right hand: Secondary | ICD-10-CM | POA: Diagnosis not present

## 2016-02-15 DIAGNOSIS — I10 Essential (primary) hypertension: Secondary | ICD-10-CM | POA: Diagnosis not present

## 2016-02-15 DIAGNOSIS — L89893 Pressure ulcer of other site, stage 3: Secondary | ICD-10-CM | POA: Diagnosis not present

## 2016-02-15 NOTE — Telephone Encounter (Signed)
Hulda Marin called to advise that the patients sugars have been low for the last few mornings. 67 then 76. She didn't know if the patient needs to be started on a medicaztion or not

## 2016-02-15 NOTE — Telephone Encounter (Signed)
Per patients daughter patient eats regular food with oral supplements and her sugar has been 67, 79 the past two mornings. Per Terri Piedra patient was advise to take an extra half of a boost between her meal of lunch and dinner. Advised patients daughter that I would still send message to you to see if there was anything else that she needed to do.

## 2016-02-16 DIAGNOSIS — E1152 Type 2 diabetes mellitus with diabetic peripheral angiopathy with gangrene: Secondary | ICD-10-CM | POA: Diagnosis not present

## 2016-02-16 DIAGNOSIS — G309 Alzheimer's disease, unspecified: Secondary | ICD-10-CM | POA: Diagnosis not present

## 2016-02-16 DIAGNOSIS — L89893 Pressure ulcer of other site, stage 3: Secondary | ICD-10-CM | POA: Diagnosis not present

## 2016-02-16 DIAGNOSIS — J690 Pneumonitis due to inhalation of food and vomit: Secondary | ICD-10-CM | POA: Diagnosis not present

## 2016-02-16 DIAGNOSIS — L89153 Pressure ulcer of sacral region, stage 3: Secondary | ICD-10-CM | POA: Diagnosis not present

## 2016-02-16 DIAGNOSIS — K3184 Gastroparesis: Secondary | ICD-10-CM | POA: Diagnosis not present

## 2016-02-16 DIAGNOSIS — I10 Essential (primary) hypertension: Secondary | ICD-10-CM | POA: Diagnosis not present

## 2016-02-16 DIAGNOSIS — E1143 Type 2 diabetes mellitus with diabetic autonomic (poly)neuropathy: Secondary | ICD-10-CM | POA: Diagnosis not present

## 2016-02-16 DIAGNOSIS — L97429 Non-pressure chronic ulcer of left heel and midfoot with unspecified severity: Secondary | ICD-10-CM | POA: Diagnosis not present

## 2016-02-16 DIAGNOSIS — M10041 Idiopathic gout, right hand: Secondary | ICD-10-CM | POA: Diagnosis not present

## 2016-02-16 DIAGNOSIS — R1310 Dysphagia, unspecified: Secondary | ICD-10-CM | POA: Diagnosis not present

## 2016-02-16 DIAGNOSIS — E43 Unspecified severe protein-calorie malnutrition: Secondary | ICD-10-CM | POA: Diagnosis not present

## 2016-02-16 NOTE — Telephone Encounter (Signed)
This sounds reasonable, as there is no medications to be reduced or stopped to help this

## 2016-02-17 ENCOUNTER — Encounter: Payer: Self-pay | Admitting: Vascular Surgery

## 2016-02-17 DIAGNOSIS — K3184 Gastroparesis: Secondary | ICD-10-CM | POA: Diagnosis not present

## 2016-02-17 DIAGNOSIS — R1310 Dysphagia, unspecified: Secondary | ICD-10-CM | POA: Diagnosis not present

## 2016-02-17 DIAGNOSIS — I10 Essential (primary) hypertension: Secondary | ICD-10-CM | POA: Diagnosis not present

## 2016-02-17 DIAGNOSIS — E1152 Type 2 diabetes mellitus with diabetic peripheral angiopathy with gangrene: Secondary | ICD-10-CM | POA: Diagnosis not present

## 2016-02-17 DIAGNOSIS — L89153 Pressure ulcer of sacral region, stage 3: Secondary | ICD-10-CM | POA: Diagnosis not present

## 2016-02-17 DIAGNOSIS — J690 Pneumonitis due to inhalation of food and vomit: Secondary | ICD-10-CM | POA: Diagnosis not present

## 2016-02-17 DIAGNOSIS — L89893 Pressure ulcer of other site, stage 3: Secondary | ICD-10-CM | POA: Diagnosis not present

## 2016-02-17 DIAGNOSIS — M10041 Idiopathic gout, right hand: Secondary | ICD-10-CM | POA: Diagnosis not present

## 2016-02-17 DIAGNOSIS — E1143 Type 2 diabetes mellitus with diabetic autonomic (poly)neuropathy: Secondary | ICD-10-CM | POA: Diagnosis not present

## 2016-02-17 DIAGNOSIS — L97429 Non-pressure chronic ulcer of left heel and midfoot with unspecified severity: Secondary | ICD-10-CM | POA: Diagnosis not present

## 2016-02-17 DIAGNOSIS — G309 Alzheimer's disease, unspecified: Secondary | ICD-10-CM | POA: Diagnosis not present

## 2016-02-17 DIAGNOSIS — E43 Unspecified severe protein-calorie malnutrition: Secondary | ICD-10-CM | POA: Diagnosis not present

## 2016-02-18 DIAGNOSIS — E43 Unspecified severe protein-calorie malnutrition: Secondary | ICD-10-CM | POA: Diagnosis not present

## 2016-02-18 DIAGNOSIS — J690 Pneumonitis due to inhalation of food and vomit: Secondary | ICD-10-CM | POA: Diagnosis not present

## 2016-02-18 DIAGNOSIS — L97429 Non-pressure chronic ulcer of left heel and midfoot with unspecified severity: Secondary | ICD-10-CM | POA: Diagnosis not present

## 2016-02-18 DIAGNOSIS — R1310 Dysphagia, unspecified: Secondary | ICD-10-CM | POA: Diagnosis not present

## 2016-02-18 DIAGNOSIS — G309 Alzheimer's disease, unspecified: Secondary | ICD-10-CM | POA: Diagnosis not present

## 2016-02-18 DIAGNOSIS — L89153 Pressure ulcer of sacral region, stage 3: Secondary | ICD-10-CM | POA: Diagnosis not present

## 2016-02-18 DIAGNOSIS — I10 Essential (primary) hypertension: Secondary | ICD-10-CM | POA: Diagnosis not present

## 2016-02-18 DIAGNOSIS — E1152 Type 2 diabetes mellitus with diabetic peripheral angiopathy with gangrene: Secondary | ICD-10-CM | POA: Diagnosis not present

## 2016-02-18 DIAGNOSIS — E1143 Type 2 diabetes mellitus with diabetic autonomic (poly)neuropathy: Secondary | ICD-10-CM | POA: Diagnosis not present

## 2016-02-18 DIAGNOSIS — M10041 Idiopathic gout, right hand: Secondary | ICD-10-CM | POA: Diagnosis not present

## 2016-02-18 DIAGNOSIS — L89893 Pressure ulcer of other site, stage 3: Secondary | ICD-10-CM | POA: Diagnosis not present

## 2016-02-18 DIAGNOSIS — K3184 Gastroparesis: Secondary | ICD-10-CM | POA: Diagnosis not present

## 2016-02-19 ENCOUNTER — Ambulatory Visit (INDEPENDENT_AMBULATORY_CARE_PROVIDER_SITE_OTHER): Payer: Medicare Other | Admitting: Internal Medicine

## 2016-02-19 ENCOUNTER — Encounter: Payer: Self-pay | Admitting: Internal Medicine

## 2016-02-19 VITALS — BP 130/60 | HR 54 | Temp 98.1°F | Resp 12

## 2016-02-19 DIAGNOSIS — K3184 Gastroparesis: Secondary | ICD-10-CM | POA: Diagnosis not present

## 2016-02-19 DIAGNOSIS — A499 Bacterial infection, unspecified: Secondary | ICD-10-CM

## 2016-02-19 DIAGNOSIS — R1310 Dysphagia, unspecified: Secondary | ICD-10-CM | POA: Diagnosis not present

## 2016-02-19 DIAGNOSIS — G309 Alzheimer's disease, unspecified: Secondary | ICD-10-CM | POA: Diagnosis not present

## 2016-02-19 DIAGNOSIS — J690 Pneumonitis due to inhalation of food and vomit: Secondary | ICD-10-CM | POA: Diagnosis not present

## 2016-02-19 DIAGNOSIS — I10 Essential (primary) hypertension: Secondary | ICD-10-CM | POA: Diagnosis not present

## 2016-02-19 DIAGNOSIS — B369 Superficial mycosis, unspecified: Secondary | ICD-10-CM

## 2016-02-19 DIAGNOSIS — E43 Unspecified severe protein-calorie malnutrition: Secondary | ICD-10-CM | POA: Diagnosis not present

## 2016-02-19 DIAGNOSIS — L89893 Pressure ulcer of other site, stage 3: Secondary | ICD-10-CM | POA: Diagnosis not present

## 2016-02-19 DIAGNOSIS — E1143 Type 2 diabetes mellitus with diabetic autonomic (poly)neuropathy: Secondary | ICD-10-CM | POA: Diagnosis not present

## 2016-02-19 DIAGNOSIS — H1089 Other conjunctivitis: Secondary | ICD-10-CM | POA: Diagnosis not present

## 2016-02-19 DIAGNOSIS — L97429 Non-pressure chronic ulcer of left heel and midfoot with unspecified severity: Secondary | ICD-10-CM | POA: Diagnosis not present

## 2016-02-19 DIAGNOSIS — L89153 Pressure ulcer of sacral region, stage 3: Secondary | ICD-10-CM | POA: Diagnosis not present

## 2016-02-19 DIAGNOSIS — M10041 Idiopathic gout, right hand: Secondary | ICD-10-CM | POA: Diagnosis not present

## 2016-02-19 DIAGNOSIS — H109 Unspecified conjunctivitis: Secondary | ICD-10-CM

## 2016-02-19 DIAGNOSIS — E1152 Type 2 diabetes mellitus with diabetic peripheral angiopathy with gangrene: Secondary | ICD-10-CM | POA: Diagnosis not present

## 2016-02-19 MED ORDER — ERYTHROMYCIN 5 MG/GM OP OINT: 1.0000 "application " | TOPICAL_OINTMENT | Freq: Three times a day (TID) | OPHTHALMIC | Status: DC

## 2016-02-19 MED ORDER — NYSTATIN-TRIAMCINOLONE 100000-0.1 UNIT/GM-% EX OINT: 1.0000 "application " | TOPICAL_OINTMENT | Freq: Two times a day (BID) | CUTANEOUS | Status: DC

## 2016-02-19 NOTE — Patient Instructions (Signed)
The erythromycin ointment is for the eyes so apply a thin ribbon on the top of the lower eyelid and blink several times to get the medicine into the eye. Use it 3 times a day for 2-3 days.   We have also sent in mycolog cream for under her armpits. The other name for this is nystatin/triamcinolone. Use it twice a day until the rash is gone. You can then use it as needed if it returns.

## 2016-02-19 NOTE — Assessment & Plan Note (Signed)
Rx for erythromycin ointment for the eyes. For 3 days until cleared. Advised no contact with her eyes and avoid itching.

## 2016-02-19 NOTE — Progress Notes (Signed)
   Subjective:    Patient ID: Shari Mcmahon, female    DOB: 03/10/1927, 82 y.o.   MRN: [id number]  HPI The patient is an 82 YO female coming in for bilateral eye pain and redness and crusting. Going on for about 1 week. Not getting any better. No infectious contacts although around a grandchild last week but they were not sick. Some mild allergy symptoms. Also rash under her armpits.   Review of Systems  Constitutional: Negative for fever, activity change, appetite change, fatigue and unexpected weight change.  HENT: Negative.   Eyes: Positive for pain, redness and itching. Negative for photophobia, discharge and visual disturbance.  Respiratory: Negative.   Cardiovascular: Negative.   Gastrointestinal: Negative.   Skin: Positive for rash.      Objective:   Physical Exam  Constitutional: She appears well-developed.  Thin  HENT:  Head: Normocephalic and atraumatic.  Green crusting on the eye lids and redness in the conjunctiva bilateral, stigmata of itching.   Eyes: EOM are normal.  Neck: Normal range of motion.  Cardiovascular: Normal rate and regular rhythm.   Pulmonary/Chest: Effort normal and breath sounds normal.  Neurological: She is alert.  Skin: Skin is warm and dry.  Yeast infection on the skin under the armpit bilaterally.    Filed Vitals:   02/19/16 1451  BP: 130/60  Pulse: 54  Temp: 98.1 F (36.7 C)  TempSrc: Oral  Resp: 12  SpO2: 92%      Assessment & Plan:

## 2016-02-19 NOTE — Assessment & Plan Note (Signed)
Rx for mycolog for the armpits. Use BID until rash gone.

## 2016-02-19 NOTE — Progress Notes (Signed)
Pre visit review using our clinic review tool, if applicable. No additional management support is needed unless otherwise documented below in the visit note. 

## 2016-02-22 DIAGNOSIS — L97429 Non-pressure chronic ulcer of left heel and midfoot with unspecified severity: Secondary | ICD-10-CM | POA: Diagnosis not present

## 2016-02-22 DIAGNOSIS — E43 Unspecified severe protein-calorie malnutrition: Secondary | ICD-10-CM | POA: Diagnosis not present

## 2016-02-22 DIAGNOSIS — M10041 Idiopathic gout, right hand: Secondary | ICD-10-CM | POA: Diagnosis not present

## 2016-02-22 DIAGNOSIS — E1143 Type 2 diabetes mellitus with diabetic autonomic (poly)neuropathy: Secondary | ICD-10-CM | POA: Diagnosis not present

## 2016-02-22 DIAGNOSIS — I10 Essential (primary) hypertension: Secondary | ICD-10-CM | POA: Diagnosis not present

## 2016-02-22 DIAGNOSIS — G309 Alzheimer's disease, unspecified: Secondary | ICD-10-CM | POA: Diagnosis not present

## 2016-02-22 DIAGNOSIS — J690 Pneumonitis due to inhalation of food and vomit: Secondary | ICD-10-CM | POA: Diagnosis not present

## 2016-02-22 DIAGNOSIS — R1310 Dysphagia, unspecified: Secondary | ICD-10-CM | POA: Diagnosis not present

## 2016-02-22 DIAGNOSIS — E1152 Type 2 diabetes mellitus with diabetic peripheral angiopathy with gangrene: Secondary | ICD-10-CM | POA: Diagnosis not present

## 2016-02-22 DIAGNOSIS — K3184 Gastroparesis: Secondary | ICD-10-CM | POA: Diagnosis not present

## 2016-02-22 DIAGNOSIS — L89893 Pressure ulcer of other site, stage 3: Secondary | ICD-10-CM | POA: Diagnosis not present

## 2016-02-22 DIAGNOSIS — L89153 Pressure ulcer of sacral region, stage 3: Secondary | ICD-10-CM | POA: Diagnosis not present

## 2016-02-23 DIAGNOSIS — L89153 Pressure ulcer of sacral region, stage 3: Secondary | ICD-10-CM | POA: Diagnosis not present

## 2016-02-23 DIAGNOSIS — L97429 Non-pressure chronic ulcer of left heel and midfoot with unspecified severity: Secondary | ICD-10-CM | POA: Diagnosis not present

## 2016-02-23 DIAGNOSIS — L89893 Pressure ulcer of other site, stage 3: Secondary | ICD-10-CM | POA: Diagnosis not present

## 2016-02-23 DIAGNOSIS — J690 Pneumonitis due to inhalation of food and vomit: Secondary | ICD-10-CM | POA: Diagnosis not present

## 2016-02-23 DIAGNOSIS — E1152 Type 2 diabetes mellitus with diabetic peripheral angiopathy with gangrene: Secondary | ICD-10-CM | POA: Diagnosis not present

## 2016-02-23 DIAGNOSIS — G309 Alzheimer's disease, unspecified: Secondary | ICD-10-CM | POA: Diagnosis not present

## 2016-02-23 DIAGNOSIS — I10 Essential (primary) hypertension: Secondary | ICD-10-CM | POA: Diagnosis not present

## 2016-02-23 DIAGNOSIS — E1143 Type 2 diabetes mellitus with diabetic autonomic (poly)neuropathy: Secondary | ICD-10-CM | POA: Diagnosis not present

## 2016-02-23 DIAGNOSIS — R1310 Dysphagia, unspecified: Secondary | ICD-10-CM | POA: Diagnosis not present

## 2016-02-23 DIAGNOSIS — K3184 Gastroparesis: Secondary | ICD-10-CM | POA: Diagnosis not present

## 2016-02-23 DIAGNOSIS — M10041 Idiopathic gout, right hand: Secondary | ICD-10-CM | POA: Diagnosis not present

## 2016-02-23 DIAGNOSIS — E43 Unspecified severe protein-calorie malnutrition: Secondary | ICD-10-CM | POA: Diagnosis not present

## 2016-02-24 ENCOUNTER — Ambulatory Visit (INDEPENDENT_AMBULATORY_CARE_PROVIDER_SITE_OTHER): Payer: Medicare Other | Admitting: Vascular Surgery

## 2016-02-24 ENCOUNTER — Encounter: Payer: Self-pay | Admitting: Vascular Surgery

## 2016-02-24 VITALS — BP 101/62 | HR 60 | Temp 97.0°F | Resp 14 | Ht 66.0 in | Wt 138.0 lb

## 2016-02-24 DIAGNOSIS — I70263 Atherosclerosis of native arteries of extremities with gangrene, bilateral legs: Secondary | ICD-10-CM | POA: Diagnosis not present

## 2016-02-24 DIAGNOSIS — E1143 Type 2 diabetes mellitus with diabetic autonomic (poly)neuropathy: Secondary | ICD-10-CM | POA: Diagnosis not present

## 2016-02-24 DIAGNOSIS — E43 Unspecified severe protein-calorie malnutrition: Secondary | ICD-10-CM | POA: Diagnosis not present

## 2016-02-24 DIAGNOSIS — M10041 Idiopathic gout, right hand: Secondary | ICD-10-CM | POA: Diagnosis not present

## 2016-02-24 DIAGNOSIS — I10 Essential (primary) hypertension: Secondary | ICD-10-CM | POA: Diagnosis not present

## 2016-02-24 DIAGNOSIS — L97429 Non-pressure chronic ulcer of left heel and midfoot with unspecified severity: Secondary | ICD-10-CM | POA: Diagnosis not present

## 2016-02-24 DIAGNOSIS — L89893 Pressure ulcer of other site, stage 3: Secondary | ICD-10-CM | POA: Diagnosis not present

## 2016-02-24 DIAGNOSIS — E1152 Type 2 diabetes mellitus with diabetic peripheral angiopathy with gangrene: Secondary | ICD-10-CM | POA: Diagnosis not present

## 2016-02-24 DIAGNOSIS — R1310 Dysphagia, unspecified: Secondary | ICD-10-CM | POA: Diagnosis not present

## 2016-02-24 DIAGNOSIS — K3184 Gastroparesis: Secondary | ICD-10-CM | POA: Diagnosis not present

## 2016-02-24 DIAGNOSIS — J690 Pneumonitis due to inhalation of food and vomit: Secondary | ICD-10-CM | POA: Diagnosis not present

## 2016-02-24 DIAGNOSIS — G309 Alzheimer's disease, unspecified: Secondary | ICD-10-CM | POA: Diagnosis not present

## 2016-02-24 DIAGNOSIS — L89153 Pressure ulcer of sacral region, stage 3: Secondary | ICD-10-CM | POA: Diagnosis not present

## 2016-02-24 NOTE — Progress Notes (Signed)
Patient name: Shari Mcmahon MRN: [id number] DOB: September 08, 1927 Sex: female  REASON FOR VISIT: Follow up of necrotic toes right foot and ulceration left heel.  HPI: Shari Mcmahon is a 82 y.o. female who I last saw on 01/14/2016. She has some necrotic toes on the right foot and ulceration on the left heel. Her lower extremity arterial Doppler study at that time showed biphasic flow in the right common femoral artery with monophasic flow below that. ABI was not reliable as the arteries were calcified. On the left side there was a biphasic signal in the common femoral artery with monophasic signals below that. I did not feel that the patient was a candidate for revascularization. We instructed the nursing facility on wound care. If the toes progressively would require toe amputation as she was not agreeable to a more proximal amputation. She comes in for a follow up visit.  Her daughter reports that her sacral wound has improved significantly and has almost healed. She also states that the toes on her right foot are healing. He denies fever or chills.  Current Outpatient Prescriptions  Medication Sig Dispense Refill  . acetaminophen (TYLENOL) 325 MG tablet Take 2 tablets (650 mg total) by mouth every 6 (six) hours as needed for mild pain, moderate pain, fever or headache.    . Amino Acids-Protein Hydrolys (FEEDING SUPPLEMENT, PRO-STAT SUGAR FREE 64,) LIQD Take 30 mLs by mouth 3 (three) times daily with meals. 900 mL prn  . amLODipine (NORVASC) 5 MG tablet Take 1 tablet (5 mg total) by mouth daily. 90 tablet 3  . colchicine 0.6 MG tablet Take 1 tablet (0.6 mg total) by mouth daily as needed (Gout pain.). 30 tablet 11  . donepezil (ARICEPT) 10 MG tablet Take 1 tablet (10 mg total) by mouth at bedtime. Reported on 02/05/2016 90 tablet 3  . erythromycin ophthalmic ointment Place 1 application into both eyes 3 (three) times daily. 7 g 0  . febuxostat (ULORIC) 40 MG tablet Take 1 tablet (40 mg total) by  mouth daily. Reported on 02/05/2016 90 tablet 3  . feeding supplement, ENSURE ENLIVE, (ENSURE ENLIVE) LIQD Take 237 mLs by mouth 2 (two) times daily between meals.    . lactose free nutrition (BOOST PLUS) LIQD Take 237 mLs by mouth 3 (three) times daily between meals.    . latanoprost (XALATAN) 0.005 % ophthalmic solution Place 1 drop into both eyes at bedtime. Reported on 02/05/2016 2.5 mL 2  . memantine (NAMENDA XR) 28 MG CP24 24 hr capsule Take 1 capsule (28 mg total) by mouth daily. Reported on 02/05/2016 90 capsule 3  . Multiple Vitamin tablet Take 1 tablet by mouth daily. Reported on 02/05/2016    . nystatin-triamcinolone ointment (MYCOLOG) Apply 1 application topically 2 (two) times daily. 60 g 0  . omeprazole (PRILOSEC) 40 MG capsule Take 1 capsule (40 mg total) by mouth daily. 90 capsule 3  . senna (SENOKOT) 8.6 MG tablet Take 1 tablet by mouth 2 (two) times daily. Reported on 02/05/2016    . vitamin C (ASCORBIC ACID) 500 MG tablet Take 500 mg by mouth 2 (two) times daily. Reported on 02/05/2016     No current facility-administered medications for this visit.    REVIEW OF SYSTEMS:  [X]  denotes positive finding, [ ]  denotes negative finding Cardiac  Comments:  Chest pain or chest pressure:    Shortness of breath upon exertion: X   Short of breath when lying flat:    Irregular heart  rhythm:    Constitutional    Fever or chills:      PHYSICAL EXAM: Filed Vitals:   02/24/16 1028  BP: 101/62  Pulse: 60  Temp: 97 F (36.1 C)  TempSrc: Oral  Resp: 14  Height: 5\' 6"  (1.676 m)  Weight: 138 lb (62.596 kg)  SpO2: 98%    GENERAL: The patient is a well-nourished female, in no acute distress. The vital signs are documented above. CARDIOVASCULAR: There is a regular rate and rhythm. PULMONARY: There is good air exchange bilaterally without wheezing or rales. VASCULAR: She has a palpable femoral and popliteal pulse bilaterally. She has a monophasic posterior tibial and dorsalis pedis signal in  both feet. The signals are fairly brisk. The decubitus on her left heel is stable. The wounds on her toes are stable. As a new wound on the posterior lateral aspect of her right calf.  MEDICAL ISSUES:  TIBIAL ARTERY OCCLUSIVE DISEASE: Based on her exam, she has evidence of tibial artery occlusive disease bilaterally. She is not a candidate for revascularization. Fortunately, she does have fairly per signals in her feet so I think her toes could heal with continued aggressive wound care. The daughter is very involved her care and they are doing an excellent job with her dressing changes. I've instructed him to begin prevent pressure sores by floating the heels. We instructed him on how to keep a 2 x 2 between the toes to prevent them from rubbing together. I'll plan seeing her back in 3 months. She knows to call sooner if she has problems.  Deitra Mayo [facility] [place]: [phone]

## 2016-02-25 ENCOUNTER — Ambulatory Visit: Payer: Medicare Other | Admitting: Internal Medicine

## 2016-02-25 DIAGNOSIS — E1143 Type 2 diabetes mellitus with diabetic autonomic (poly)neuropathy: Secondary | ICD-10-CM | POA: Diagnosis not present

## 2016-02-25 DIAGNOSIS — G309 Alzheimer's disease, unspecified: Secondary | ICD-10-CM | POA: Diagnosis not present

## 2016-02-25 DIAGNOSIS — L89893 Pressure ulcer of other site, stage 3: Secondary | ICD-10-CM | POA: Diagnosis not present

## 2016-02-25 DIAGNOSIS — L97429 Non-pressure chronic ulcer of left heel and midfoot with unspecified severity: Secondary | ICD-10-CM | POA: Diagnosis not present

## 2016-02-25 DIAGNOSIS — J690 Pneumonitis due to inhalation of food and vomit: Secondary | ICD-10-CM | POA: Diagnosis not present

## 2016-02-25 DIAGNOSIS — L89153 Pressure ulcer of sacral region, stage 3: Secondary | ICD-10-CM | POA: Diagnosis not present

## 2016-02-25 DIAGNOSIS — E43 Unspecified severe protein-calorie malnutrition: Secondary | ICD-10-CM | POA: Diagnosis not present

## 2016-02-25 DIAGNOSIS — M10041 Idiopathic gout, right hand: Secondary | ICD-10-CM | POA: Diagnosis not present

## 2016-02-25 DIAGNOSIS — I10 Essential (primary) hypertension: Secondary | ICD-10-CM | POA: Diagnosis not present

## 2016-02-25 DIAGNOSIS — E1152 Type 2 diabetes mellitus with diabetic peripheral angiopathy with gangrene: Secondary | ICD-10-CM | POA: Diagnosis not present

## 2016-02-25 DIAGNOSIS — K3184 Gastroparesis: Secondary | ICD-10-CM | POA: Diagnosis not present

## 2016-02-25 DIAGNOSIS — R1310 Dysphagia, unspecified: Secondary | ICD-10-CM | POA: Diagnosis not present

## 2016-02-27 DIAGNOSIS — L97423 Non-pressure chronic ulcer of left heel and midfoot with necrosis of muscle: Secondary | ICD-10-CM | POA: Diagnosis not present

## 2016-02-27 DIAGNOSIS — Z9181 History of falling: Secondary | ICD-10-CM | POA: Diagnosis not present

## 2016-02-29 DIAGNOSIS — E1152 Type 2 diabetes mellitus with diabetic peripheral angiopathy with gangrene: Secondary | ICD-10-CM | POA: Diagnosis not present

## 2016-02-29 DIAGNOSIS — E1143 Type 2 diabetes mellitus with diabetic autonomic (poly)neuropathy: Secondary | ICD-10-CM | POA: Diagnosis not present

## 2016-02-29 DIAGNOSIS — M10041 Idiopathic gout, right hand: Secondary | ICD-10-CM | POA: Diagnosis not present

## 2016-02-29 DIAGNOSIS — L89893 Pressure ulcer of other site, stage 3: Secondary | ICD-10-CM | POA: Diagnosis not present

## 2016-02-29 DIAGNOSIS — R1310 Dysphagia, unspecified: Secondary | ICD-10-CM | POA: Diagnosis not present

## 2016-02-29 DIAGNOSIS — L97429 Non-pressure chronic ulcer of left heel and midfoot with unspecified severity: Secondary | ICD-10-CM | POA: Diagnosis not present

## 2016-02-29 DIAGNOSIS — I10 Essential (primary) hypertension: Secondary | ICD-10-CM | POA: Diagnosis not present

## 2016-02-29 DIAGNOSIS — E43 Unspecified severe protein-calorie malnutrition: Secondary | ICD-10-CM | POA: Diagnosis not present

## 2016-02-29 DIAGNOSIS — J690 Pneumonitis due to inhalation of food and vomit: Secondary | ICD-10-CM | POA: Diagnosis not present

## 2016-02-29 DIAGNOSIS — L89153 Pressure ulcer of sacral region, stage 3: Secondary | ICD-10-CM | POA: Diagnosis not present

## 2016-02-29 DIAGNOSIS — K3184 Gastroparesis: Secondary | ICD-10-CM | POA: Diagnosis not present

## 2016-02-29 DIAGNOSIS — G309 Alzheimer's disease, unspecified: Secondary | ICD-10-CM | POA: Diagnosis not present

## 2016-03-01 DIAGNOSIS — L97429 Non-pressure chronic ulcer of left heel and midfoot with unspecified severity: Secondary | ICD-10-CM | POA: Diagnosis not present

## 2016-03-01 DIAGNOSIS — K3184 Gastroparesis: Secondary | ICD-10-CM | POA: Diagnosis not present

## 2016-03-01 DIAGNOSIS — E43 Unspecified severe protein-calorie malnutrition: Secondary | ICD-10-CM | POA: Diagnosis not present

## 2016-03-01 DIAGNOSIS — E1152 Type 2 diabetes mellitus with diabetic peripheral angiopathy with gangrene: Secondary | ICD-10-CM | POA: Diagnosis not present

## 2016-03-01 DIAGNOSIS — R1310 Dysphagia, unspecified: Secondary | ICD-10-CM | POA: Diagnosis not present

## 2016-03-01 DIAGNOSIS — L89153 Pressure ulcer of sacral region, stage 3: Secondary | ICD-10-CM | POA: Diagnosis not present

## 2016-03-01 DIAGNOSIS — I10 Essential (primary) hypertension: Secondary | ICD-10-CM | POA: Diagnosis not present

## 2016-03-01 DIAGNOSIS — E1143 Type 2 diabetes mellitus with diabetic autonomic (poly)neuropathy: Secondary | ICD-10-CM | POA: Diagnosis not present

## 2016-03-01 DIAGNOSIS — J690 Pneumonitis due to inhalation of food and vomit: Secondary | ICD-10-CM | POA: Diagnosis not present

## 2016-03-01 DIAGNOSIS — M10041 Idiopathic gout, right hand: Secondary | ICD-10-CM | POA: Diagnosis not present

## 2016-03-01 DIAGNOSIS — G309 Alzheimer's disease, unspecified: Secondary | ICD-10-CM | POA: Diagnosis not present

## 2016-03-01 DIAGNOSIS — L89893 Pressure ulcer of other site, stage 3: Secondary | ICD-10-CM | POA: Diagnosis not present

## 2016-03-02 DIAGNOSIS — L97429 Non-pressure chronic ulcer of left heel and midfoot with unspecified severity: Secondary | ICD-10-CM | POA: Diagnosis not present

## 2016-03-02 DIAGNOSIS — L89893 Pressure ulcer of other site, stage 3: Secondary | ICD-10-CM | POA: Diagnosis not present

## 2016-03-02 DIAGNOSIS — E43 Unspecified severe protein-calorie malnutrition: Secondary | ICD-10-CM | POA: Diagnosis not present

## 2016-03-02 DIAGNOSIS — E1143 Type 2 diabetes mellitus with diabetic autonomic (poly)neuropathy: Secondary | ICD-10-CM | POA: Diagnosis not present

## 2016-03-02 DIAGNOSIS — E1152 Type 2 diabetes mellitus with diabetic peripheral angiopathy with gangrene: Secondary | ICD-10-CM | POA: Diagnosis not present

## 2016-03-02 DIAGNOSIS — K3184 Gastroparesis: Secondary | ICD-10-CM | POA: Diagnosis not present

## 2016-03-02 DIAGNOSIS — G309 Alzheimer's disease, unspecified: Secondary | ICD-10-CM | POA: Diagnosis not present

## 2016-03-02 DIAGNOSIS — L89153 Pressure ulcer of sacral region, stage 3: Secondary | ICD-10-CM | POA: Diagnosis not present

## 2016-03-02 DIAGNOSIS — R1310 Dysphagia, unspecified: Secondary | ICD-10-CM | POA: Diagnosis not present

## 2016-03-02 DIAGNOSIS — J690 Pneumonitis due to inhalation of food and vomit: Secondary | ICD-10-CM | POA: Diagnosis not present

## 2016-03-02 DIAGNOSIS — I10 Essential (primary) hypertension: Secondary | ICD-10-CM | POA: Diagnosis not present

## 2016-03-02 DIAGNOSIS — M10041 Idiopathic gout, right hand: Secondary | ICD-10-CM | POA: Diagnosis not present

## 2016-03-03 DIAGNOSIS — G309 Alzheimer's disease, unspecified: Secondary | ICD-10-CM | POA: Diagnosis not present

## 2016-03-03 DIAGNOSIS — E43 Unspecified severe protein-calorie malnutrition: Secondary | ICD-10-CM | POA: Diagnosis not present

## 2016-03-03 DIAGNOSIS — I10 Essential (primary) hypertension: Secondary | ICD-10-CM | POA: Diagnosis not present

## 2016-03-03 DIAGNOSIS — R1310 Dysphagia, unspecified: Secondary | ICD-10-CM | POA: Diagnosis not present

## 2016-03-03 DIAGNOSIS — M10041 Idiopathic gout, right hand: Secondary | ICD-10-CM | POA: Diagnosis not present

## 2016-03-03 DIAGNOSIS — K3184 Gastroparesis: Secondary | ICD-10-CM | POA: Diagnosis not present

## 2016-03-03 DIAGNOSIS — J690 Pneumonitis due to inhalation of food and vomit: Secondary | ICD-10-CM | POA: Diagnosis not present

## 2016-03-03 DIAGNOSIS — L89893 Pressure ulcer of other site, stage 3: Secondary | ICD-10-CM | POA: Diagnosis not present

## 2016-03-03 DIAGNOSIS — L89153 Pressure ulcer of sacral region, stage 3: Secondary | ICD-10-CM | POA: Diagnosis not present

## 2016-03-03 DIAGNOSIS — E1143 Type 2 diabetes mellitus with diabetic autonomic (poly)neuropathy: Secondary | ICD-10-CM | POA: Diagnosis not present

## 2016-03-03 DIAGNOSIS — L97429 Non-pressure chronic ulcer of left heel and midfoot with unspecified severity: Secondary | ICD-10-CM | POA: Diagnosis not present

## 2016-03-03 DIAGNOSIS — E1152 Type 2 diabetes mellitus with diabetic peripheral angiopathy with gangrene: Secondary | ICD-10-CM | POA: Diagnosis not present

## 2016-03-04 ENCOUNTER — Telehealth: Payer: Self-pay | Admitting: *Deleted

## 2016-03-04 DIAGNOSIS — E1143 Type 2 diabetes mellitus with diabetic autonomic (poly)neuropathy: Secondary | ICD-10-CM | POA: Diagnosis not present

## 2016-03-04 DIAGNOSIS — J690 Pneumonitis due to inhalation of food and vomit: Secondary | ICD-10-CM | POA: Diagnosis not present

## 2016-03-04 DIAGNOSIS — G309 Alzheimer's disease, unspecified: Secondary | ICD-10-CM | POA: Diagnosis not present

## 2016-03-04 DIAGNOSIS — L89893 Pressure ulcer of other site, stage 3: Secondary | ICD-10-CM | POA: Diagnosis not present

## 2016-03-04 DIAGNOSIS — E43 Unspecified severe protein-calorie malnutrition: Secondary | ICD-10-CM | POA: Diagnosis not present

## 2016-03-04 DIAGNOSIS — M10041 Idiopathic gout, right hand: Secondary | ICD-10-CM | POA: Diagnosis not present

## 2016-03-04 DIAGNOSIS — I10 Essential (primary) hypertension: Secondary | ICD-10-CM | POA: Diagnosis not present

## 2016-03-04 DIAGNOSIS — E1152 Type 2 diabetes mellitus with diabetic peripheral angiopathy with gangrene: Secondary | ICD-10-CM | POA: Diagnosis not present

## 2016-03-04 DIAGNOSIS — K3184 Gastroparesis: Secondary | ICD-10-CM | POA: Diagnosis not present

## 2016-03-04 DIAGNOSIS — R1310 Dysphagia, unspecified: Secondary | ICD-10-CM | POA: Diagnosis not present

## 2016-03-04 DIAGNOSIS — L97429 Non-pressure chronic ulcer of left heel and midfoot with unspecified severity: Secondary | ICD-10-CM | POA: Diagnosis not present

## 2016-03-04 DIAGNOSIS — L89153 Pressure ulcer of sacral region, stage 3: Secondary | ICD-10-CM | POA: Diagnosis not present

## 2016-03-04 NOTE — Telephone Encounter (Signed)
Called Shari Mcmahon no answer LMOM w/ MD response../lmb 

## 2016-03-04 NOTE — Telephone Encounter (Signed)
Left msg on triage requesting verbal order to continue PT 2 x's a week for 3 wks...Johny Chess

## 2016-03-04 NOTE — Telephone Encounter (Signed)
Ok for verbal 

## 2016-03-07 DIAGNOSIS — I10 Essential (primary) hypertension: Secondary | ICD-10-CM | POA: Diagnosis not present

## 2016-03-07 DIAGNOSIS — E43 Unspecified severe protein-calorie malnutrition: Secondary | ICD-10-CM | POA: Diagnosis not present

## 2016-03-07 DIAGNOSIS — M10041 Idiopathic gout, right hand: Secondary | ICD-10-CM | POA: Diagnosis not present

## 2016-03-07 DIAGNOSIS — E1152 Type 2 diabetes mellitus with diabetic peripheral angiopathy with gangrene: Secondary | ICD-10-CM | POA: Diagnosis not present

## 2016-03-07 DIAGNOSIS — L97429 Non-pressure chronic ulcer of left heel and midfoot with unspecified severity: Secondary | ICD-10-CM | POA: Diagnosis not present

## 2016-03-07 DIAGNOSIS — L89153 Pressure ulcer of sacral region, stage 3: Secondary | ICD-10-CM | POA: Diagnosis not present

## 2016-03-07 DIAGNOSIS — G309 Alzheimer's disease, unspecified: Secondary | ICD-10-CM | POA: Diagnosis not present

## 2016-03-07 DIAGNOSIS — K3184 Gastroparesis: Secondary | ICD-10-CM | POA: Diagnosis not present

## 2016-03-07 DIAGNOSIS — R1310 Dysphagia, unspecified: Secondary | ICD-10-CM | POA: Diagnosis not present

## 2016-03-07 DIAGNOSIS — E1143 Type 2 diabetes mellitus with diabetic autonomic (poly)neuropathy: Secondary | ICD-10-CM | POA: Diagnosis not present

## 2016-03-07 DIAGNOSIS — L89893 Pressure ulcer of other site, stage 3: Secondary | ICD-10-CM | POA: Diagnosis not present

## 2016-03-07 DIAGNOSIS — J690 Pneumonitis due to inhalation of food and vomit: Secondary | ICD-10-CM | POA: Diagnosis not present

## 2016-03-08 DIAGNOSIS — L97429 Non-pressure chronic ulcer of left heel and midfoot with unspecified severity: Secondary | ICD-10-CM | POA: Diagnosis not present

## 2016-03-08 DIAGNOSIS — E43 Unspecified severe protein-calorie malnutrition: Secondary | ICD-10-CM | POA: Diagnosis not present

## 2016-03-08 DIAGNOSIS — I10 Essential (primary) hypertension: Secondary | ICD-10-CM | POA: Diagnosis not present

## 2016-03-08 DIAGNOSIS — E1143 Type 2 diabetes mellitus with diabetic autonomic (poly)neuropathy: Secondary | ICD-10-CM | POA: Diagnosis not present

## 2016-03-08 DIAGNOSIS — R1310 Dysphagia, unspecified: Secondary | ICD-10-CM | POA: Diagnosis not present

## 2016-03-08 DIAGNOSIS — L89893 Pressure ulcer of other site, stage 3: Secondary | ICD-10-CM | POA: Diagnosis not present

## 2016-03-08 DIAGNOSIS — J690 Pneumonitis due to inhalation of food and vomit: Secondary | ICD-10-CM | POA: Diagnosis not present

## 2016-03-08 DIAGNOSIS — L89153 Pressure ulcer of sacral region, stage 3: Secondary | ICD-10-CM | POA: Diagnosis not present

## 2016-03-08 DIAGNOSIS — E1152 Type 2 diabetes mellitus with diabetic peripheral angiopathy with gangrene: Secondary | ICD-10-CM | POA: Diagnosis not present

## 2016-03-08 DIAGNOSIS — G309 Alzheimer's disease, unspecified: Secondary | ICD-10-CM | POA: Diagnosis not present

## 2016-03-08 DIAGNOSIS — M10041 Idiopathic gout, right hand: Secondary | ICD-10-CM | POA: Diagnosis not present

## 2016-03-08 DIAGNOSIS — K3184 Gastroparesis: Secondary | ICD-10-CM | POA: Diagnosis not present

## 2016-03-09 DIAGNOSIS — E43 Unspecified severe protein-calorie malnutrition: Secondary | ICD-10-CM | POA: Diagnosis not present

## 2016-03-09 DIAGNOSIS — E1143 Type 2 diabetes mellitus with diabetic autonomic (poly)neuropathy: Secondary | ICD-10-CM | POA: Diagnosis not present

## 2016-03-09 DIAGNOSIS — M10041 Idiopathic gout, right hand: Secondary | ICD-10-CM | POA: Diagnosis not present

## 2016-03-09 DIAGNOSIS — G309 Alzheimer's disease, unspecified: Secondary | ICD-10-CM | POA: Diagnosis not present

## 2016-03-09 DIAGNOSIS — L97429 Non-pressure chronic ulcer of left heel and midfoot with unspecified severity: Secondary | ICD-10-CM | POA: Diagnosis not present

## 2016-03-09 DIAGNOSIS — I10 Essential (primary) hypertension: Secondary | ICD-10-CM | POA: Diagnosis not present

## 2016-03-09 DIAGNOSIS — R1310 Dysphagia, unspecified: Secondary | ICD-10-CM | POA: Diagnosis not present

## 2016-03-09 DIAGNOSIS — K3184 Gastroparesis: Secondary | ICD-10-CM | POA: Diagnosis not present

## 2016-03-09 DIAGNOSIS — L89893 Pressure ulcer of other site, stage 3: Secondary | ICD-10-CM | POA: Diagnosis not present

## 2016-03-09 DIAGNOSIS — J690 Pneumonitis due to inhalation of food and vomit: Secondary | ICD-10-CM | POA: Diagnosis not present

## 2016-03-09 DIAGNOSIS — L89153 Pressure ulcer of sacral region, stage 3: Secondary | ICD-10-CM | POA: Diagnosis not present

## 2016-03-09 DIAGNOSIS — E1152 Type 2 diabetes mellitus with diabetic peripheral angiopathy with gangrene: Secondary | ICD-10-CM | POA: Diagnosis not present

## 2016-03-10 DIAGNOSIS — M10041 Idiopathic gout, right hand: Secondary | ICD-10-CM | POA: Diagnosis not present

## 2016-03-10 DIAGNOSIS — G309 Alzheimer's disease, unspecified: Secondary | ICD-10-CM | POA: Diagnosis not present

## 2016-03-10 DIAGNOSIS — L97429 Non-pressure chronic ulcer of left heel and midfoot with unspecified severity: Secondary | ICD-10-CM | POA: Diagnosis not present

## 2016-03-10 DIAGNOSIS — K3184 Gastroparesis: Secondary | ICD-10-CM | POA: Diagnosis not present

## 2016-03-10 DIAGNOSIS — J690 Pneumonitis due to inhalation of food and vomit: Secondary | ICD-10-CM | POA: Diagnosis not present

## 2016-03-10 DIAGNOSIS — E1143 Type 2 diabetes mellitus with diabetic autonomic (poly)neuropathy: Secondary | ICD-10-CM | POA: Diagnosis not present

## 2016-03-10 DIAGNOSIS — L89893 Pressure ulcer of other site, stage 3: Secondary | ICD-10-CM | POA: Diagnosis not present

## 2016-03-10 DIAGNOSIS — R1310 Dysphagia, unspecified: Secondary | ICD-10-CM | POA: Diagnosis not present

## 2016-03-10 DIAGNOSIS — I10 Essential (primary) hypertension: Secondary | ICD-10-CM | POA: Diagnosis not present

## 2016-03-10 DIAGNOSIS — E43 Unspecified severe protein-calorie malnutrition: Secondary | ICD-10-CM | POA: Diagnosis not present

## 2016-03-10 DIAGNOSIS — L89153 Pressure ulcer of sacral region, stage 3: Secondary | ICD-10-CM | POA: Diagnosis not present

## 2016-03-10 DIAGNOSIS — E1152 Type 2 diabetes mellitus with diabetic peripheral angiopathy with gangrene: Secondary | ICD-10-CM | POA: Diagnosis not present

## 2016-03-11 DIAGNOSIS — I10 Essential (primary) hypertension: Secondary | ICD-10-CM | POA: Diagnosis not present

## 2016-03-11 DIAGNOSIS — G309 Alzheimer's disease, unspecified: Secondary | ICD-10-CM | POA: Diagnosis not present

## 2016-03-11 DIAGNOSIS — K3184 Gastroparesis: Secondary | ICD-10-CM | POA: Diagnosis not present

## 2016-03-11 DIAGNOSIS — M10041 Idiopathic gout, right hand: Secondary | ICD-10-CM | POA: Diagnosis not present

## 2016-03-11 DIAGNOSIS — L97429 Non-pressure chronic ulcer of left heel and midfoot with unspecified severity: Secondary | ICD-10-CM | POA: Diagnosis not present

## 2016-03-11 DIAGNOSIS — J690 Pneumonitis due to inhalation of food and vomit: Secondary | ICD-10-CM | POA: Diagnosis not present

## 2016-03-11 DIAGNOSIS — E1143 Type 2 diabetes mellitus with diabetic autonomic (poly)neuropathy: Secondary | ICD-10-CM | POA: Diagnosis not present

## 2016-03-11 DIAGNOSIS — E1152 Type 2 diabetes mellitus with diabetic peripheral angiopathy with gangrene: Secondary | ICD-10-CM | POA: Diagnosis not present

## 2016-03-11 DIAGNOSIS — L89893 Pressure ulcer of other site, stage 3: Secondary | ICD-10-CM | POA: Diagnosis not present

## 2016-03-11 DIAGNOSIS — L89153 Pressure ulcer of sacral region, stage 3: Secondary | ICD-10-CM | POA: Diagnosis not present

## 2016-03-11 DIAGNOSIS — R1310 Dysphagia, unspecified: Secondary | ICD-10-CM | POA: Diagnosis not present

## 2016-03-11 DIAGNOSIS — E43 Unspecified severe protein-calorie malnutrition: Secondary | ICD-10-CM | POA: Diagnosis not present

## 2016-03-14 DIAGNOSIS — L89893 Pressure ulcer of other site, stage 3: Secondary | ICD-10-CM | POA: Diagnosis not present

## 2016-03-14 DIAGNOSIS — M10041 Idiopathic gout, right hand: Secondary | ICD-10-CM | POA: Diagnosis not present

## 2016-03-14 DIAGNOSIS — G309 Alzheimer's disease, unspecified: Secondary | ICD-10-CM | POA: Diagnosis not present

## 2016-03-14 DIAGNOSIS — E1143 Type 2 diabetes mellitus with diabetic autonomic (poly)neuropathy: Secondary | ICD-10-CM | POA: Diagnosis not present

## 2016-03-14 DIAGNOSIS — E43 Unspecified severe protein-calorie malnutrition: Secondary | ICD-10-CM | POA: Diagnosis not present

## 2016-03-14 DIAGNOSIS — L89153 Pressure ulcer of sacral region, stage 3: Secondary | ICD-10-CM | POA: Diagnosis not present

## 2016-03-14 DIAGNOSIS — K3184 Gastroparesis: Secondary | ICD-10-CM | POA: Diagnosis not present

## 2016-03-14 DIAGNOSIS — I10 Essential (primary) hypertension: Secondary | ICD-10-CM | POA: Diagnosis not present

## 2016-03-14 DIAGNOSIS — L97429 Non-pressure chronic ulcer of left heel and midfoot with unspecified severity: Secondary | ICD-10-CM | POA: Diagnosis not present

## 2016-03-14 DIAGNOSIS — E1152 Type 2 diabetes mellitus with diabetic peripheral angiopathy with gangrene: Secondary | ICD-10-CM | POA: Diagnosis not present

## 2016-03-14 DIAGNOSIS — R1310 Dysphagia, unspecified: Secondary | ICD-10-CM | POA: Diagnosis not present

## 2016-03-14 DIAGNOSIS — J690 Pneumonitis due to inhalation of food and vomit: Secondary | ICD-10-CM | POA: Diagnosis not present

## 2016-03-15 DIAGNOSIS — L97429 Non-pressure chronic ulcer of left heel and midfoot with unspecified severity: Secondary | ICD-10-CM | POA: Diagnosis not present

## 2016-03-15 DIAGNOSIS — G309 Alzheimer's disease, unspecified: Secondary | ICD-10-CM | POA: Diagnosis not present

## 2016-03-15 DIAGNOSIS — E43 Unspecified severe protein-calorie malnutrition: Secondary | ICD-10-CM | POA: Diagnosis not present

## 2016-03-15 DIAGNOSIS — I10 Essential (primary) hypertension: Secondary | ICD-10-CM | POA: Diagnosis not present

## 2016-03-15 DIAGNOSIS — M10041 Idiopathic gout, right hand: Secondary | ICD-10-CM | POA: Diagnosis not present

## 2016-03-15 DIAGNOSIS — E1143 Type 2 diabetes mellitus with diabetic autonomic (poly)neuropathy: Secondary | ICD-10-CM | POA: Diagnosis not present

## 2016-03-15 DIAGNOSIS — E1152 Type 2 diabetes mellitus with diabetic peripheral angiopathy with gangrene: Secondary | ICD-10-CM | POA: Diagnosis not present

## 2016-03-15 DIAGNOSIS — J690 Pneumonitis due to inhalation of food and vomit: Secondary | ICD-10-CM | POA: Diagnosis not present

## 2016-03-15 DIAGNOSIS — L89153 Pressure ulcer of sacral region, stage 3: Secondary | ICD-10-CM | POA: Diagnosis not present

## 2016-03-15 DIAGNOSIS — K3184 Gastroparesis: Secondary | ICD-10-CM | POA: Diagnosis not present

## 2016-03-15 DIAGNOSIS — R1310 Dysphagia, unspecified: Secondary | ICD-10-CM | POA: Diagnosis not present

## 2016-03-15 DIAGNOSIS — L89893 Pressure ulcer of other site, stage 3: Secondary | ICD-10-CM | POA: Diagnosis not present

## 2016-03-16 DIAGNOSIS — K3184 Gastroparesis: Secondary | ICD-10-CM | POA: Diagnosis not present

## 2016-03-16 DIAGNOSIS — L97429 Non-pressure chronic ulcer of left heel and midfoot with unspecified severity: Secondary | ICD-10-CM | POA: Diagnosis not present

## 2016-03-16 DIAGNOSIS — E1152 Type 2 diabetes mellitus with diabetic peripheral angiopathy with gangrene: Secondary | ICD-10-CM | POA: Diagnosis not present

## 2016-03-16 DIAGNOSIS — J690 Pneumonitis due to inhalation of food and vomit: Secondary | ICD-10-CM | POA: Diagnosis not present

## 2016-03-16 DIAGNOSIS — L89153 Pressure ulcer of sacral region, stage 3: Secondary | ICD-10-CM | POA: Diagnosis not present

## 2016-03-16 DIAGNOSIS — E1143 Type 2 diabetes mellitus with diabetic autonomic (poly)neuropathy: Secondary | ICD-10-CM | POA: Diagnosis not present

## 2016-03-16 DIAGNOSIS — L89893 Pressure ulcer of other site, stage 3: Secondary | ICD-10-CM | POA: Diagnosis not present

## 2016-03-16 DIAGNOSIS — I10 Essential (primary) hypertension: Secondary | ICD-10-CM | POA: Diagnosis not present

## 2016-03-16 DIAGNOSIS — R1310 Dysphagia, unspecified: Secondary | ICD-10-CM | POA: Diagnosis not present

## 2016-03-16 DIAGNOSIS — G309 Alzheimer's disease, unspecified: Secondary | ICD-10-CM | POA: Diagnosis not present

## 2016-03-16 DIAGNOSIS — M10041 Idiopathic gout, right hand: Secondary | ICD-10-CM | POA: Diagnosis not present

## 2016-03-16 DIAGNOSIS — E43 Unspecified severe protein-calorie malnutrition: Secondary | ICD-10-CM | POA: Diagnosis not present

## 2016-03-17 DIAGNOSIS — E1143 Type 2 diabetes mellitus with diabetic autonomic (poly)neuropathy: Secondary | ICD-10-CM | POA: Diagnosis not present

## 2016-03-17 DIAGNOSIS — J690 Pneumonitis due to inhalation of food and vomit: Secondary | ICD-10-CM | POA: Diagnosis not present

## 2016-03-17 DIAGNOSIS — M10041 Idiopathic gout, right hand: Secondary | ICD-10-CM | POA: Diagnosis not present

## 2016-03-17 DIAGNOSIS — K3184 Gastroparesis: Secondary | ICD-10-CM | POA: Diagnosis not present

## 2016-03-17 DIAGNOSIS — G309 Alzheimer's disease, unspecified: Secondary | ICD-10-CM | POA: Diagnosis not present

## 2016-03-17 DIAGNOSIS — L89893 Pressure ulcer of other site, stage 3: Secondary | ICD-10-CM | POA: Diagnosis not present

## 2016-03-17 DIAGNOSIS — R1310 Dysphagia, unspecified: Secondary | ICD-10-CM | POA: Diagnosis not present

## 2016-03-17 DIAGNOSIS — E43 Unspecified severe protein-calorie malnutrition: Secondary | ICD-10-CM | POA: Diagnosis not present

## 2016-03-17 DIAGNOSIS — E1152 Type 2 diabetes mellitus with diabetic peripheral angiopathy with gangrene: Secondary | ICD-10-CM | POA: Diagnosis not present

## 2016-03-17 DIAGNOSIS — I10 Essential (primary) hypertension: Secondary | ICD-10-CM | POA: Diagnosis not present

## 2016-03-17 DIAGNOSIS — L97429 Non-pressure chronic ulcer of left heel and midfoot with unspecified severity: Secondary | ICD-10-CM | POA: Diagnosis not present

## 2016-03-17 DIAGNOSIS — L89153 Pressure ulcer of sacral region, stage 3: Secondary | ICD-10-CM | POA: Diagnosis not present

## 2016-03-18 DIAGNOSIS — G309 Alzheimer's disease, unspecified: Secondary | ICD-10-CM | POA: Diagnosis not present

## 2016-03-18 DIAGNOSIS — I10 Essential (primary) hypertension: Secondary | ICD-10-CM | POA: Diagnosis not present

## 2016-03-18 DIAGNOSIS — E43 Unspecified severe protein-calorie malnutrition: Secondary | ICD-10-CM | POA: Diagnosis not present

## 2016-03-18 DIAGNOSIS — J690 Pneumonitis due to inhalation of food and vomit: Secondary | ICD-10-CM | POA: Diagnosis not present

## 2016-03-18 DIAGNOSIS — K3184 Gastroparesis: Secondary | ICD-10-CM | POA: Diagnosis not present

## 2016-03-18 DIAGNOSIS — L89153 Pressure ulcer of sacral region, stage 3: Secondary | ICD-10-CM | POA: Diagnosis not present

## 2016-03-18 DIAGNOSIS — R1310 Dysphagia, unspecified: Secondary | ICD-10-CM | POA: Diagnosis not present

## 2016-03-18 DIAGNOSIS — E1152 Type 2 diabetes mellitus with diabetic peripheral angiopathy with gangrene: Secondary | ICD-10-CM | POA: Diagnosis not present

## 2016-03-18 DIAGNOSIS — L97429 Non-pressure chronic ulcer of left heel and midfoot with unspecified severity: Secondary | ICD-10-CM | POA: Diagnosis not present

## 2016-03-18 DIAGNOSIS — L89893 Pressure ulcer of other site, stage 3: Secondary | ICD-10-CM | POA: Diagnosis not present

## 2016-03-18 DIAGNOSIS — M10041 Idiopathic gout, right hand: Secondary | ICD-10-CM | POA: Diagnosis not present

## 2016-03-18 DIAGNOSIS — E1143 Type 2 diabetes mellitus with diabetic autonomic (poly)neuropathy: Secondary | ICD-10-CM | POA: Diagnosis not present

## 2016-03-21 DIAGNOSIS — E43 Unspecified severe protein-calorie malnutrition: Secondary | ICD-10-CM | POA: Diagnosis not present

## 2016-03-21 DIAGNOSIS — R1310 Dysphagia, unspecified: Secondary | ICD-10-CM | POA: Diagnosis not present

## 2016-03-21 DIAGNOSIS — G309 Alzheimer's disease, unspecified: Secondary | ICD-10-CM | POA: Diagnosis not present

## 2016-03-21 DIAGNOSIS — L97429 Non-pressure chronic ulcer of left heel and midfoot with unspecified severity: Secondary | ICD-10-CM | POA: Diagnosis not present

## 2016-03-21 DIAGNOSIS — J690 Pneumonitis due to inhalation of food and vomit: Secondary | ICD-10-CM | POA: Diagnosis not present

## 2016-03-21 DIAGNOSIS — M10041 Idiopathic gout, right hand: Secondary | ICD-10-CM | POA: Diagnosis not present

## 2016-03-21 DIAGNOSIS — E1143 Type 2 diabetes mellitus with diabetic autonomic (poly)neuropathy: Secondary | ICD-10-CM | POA: Diagnosis not present

## 2016-03-21 DIAGNOSIS — E1152 Type 2 diabetes mellitus with diabetic peripheral angiopathy with gangrene: Secondary | ICD-10-CM | POA: Diagnosis not present

## 2016-03-21 DIAGNOSIS — K3184 Gastroparesis: Secondary | ICD-10-CM | POA: Diagnosis not present

## 2016-03-21 DIAGNOSIS — L89893 Pressure ulcer of other site, stage 3: Secondary | ICD-10-CM | POA: Diagnosis not present

## 2016-03-21 DIAGNOSIS — L89153 Pressure ulcer of sacral region, stage 3: Secondary | ICD-10-CM | POA: Diagnosis not present

## 2016-03-21 DIAGNOSIS — I10 Essential (primary) hypertension: Secondary | ICD-10-CM | POA: Diagnosis not present

## 2016-03-23 DIAGNOSIS — K3184 Gastroparesis: Secondary | ICD-10-CM | POA: Diagnosis not present

## 2016-03-23 DIAGNOSIS — E1143 Type 2 diabetes mellitus with diabetic autonomic (poly)neuropathy: Secondary | ICD-10-CM | POA: Diagnosis not present

## 2016-03-23 DIAGNOSIS — R1310 Dysphagia, unspecified: Secondary | ICD-10-CM | POA: Diagnosis not present

## 2016-03-23 DIAGNOSIS — G309 Alzheimer's disease, unspecified: Secondary | ICD-10-CM | POA: Diagnosis not present

## 2016-03-23 DIAGNOSIS — E1152 Type 2 diabetes mellitus with diabetic peripheral angiopathy with gangrene: Secondary | ICD-10-CM | POA: Diagnosis not present

## 2016-03-23 DIAGNOSIS — I10 Essential (primary) hypertension: Secondary | ICD-10-CM | POA: Diagnosis not present

## 2016-03-23 DIAGNOSIS — J690 Pneumonitis due to inhalation of food and vomit: Secondary | ICD-10-CM | POA: Diagnosis not present

## 2016-03-23 DIAGNOSIS — L97429 Non-pressure chronic ulcer of left heel and midfoot with unspecified severity: Secondary | ICD-10-CM | POA: Diagnosis not present

## 2016-03-23 DIAGNOSIS — E43 Unspecified severe protein-calorie malnutrition: Secondary | ICD-10-CM | POA: Diagnosis not present

## 2016-03-23 DIAGNOSIS — L89893 Pressure ulcer of other site, stage 3: Secondary | ICD-10-CM | POA: Diagnosis not present

## 2016-03-23 DIAGNOSIS — M10041 Idiopathic gout, right hand: Secondary | ICD-10-CM | POA: Diagnosis not present

## 2016-03-23 DIAGNOSIS — L89153 Pressure ulcer of sacral region, stage 3: Secondary | ICD-10-CM | POA: Diagnosis not present

## 2016-03-24 DIAGNOSIS — L89893 Pressure ulcer of other site, stage 3: Secondary | ICD-10-CM | POA: Diagnosis not present

## 2016-03-24 DIAGNOSIS — E1152 Type 2 diabetes mellitus with diabetic peripheral angiopathy with gangrene: Secondary | ICD-10-CM | POA: Diagnosis not present

## 2016-03-24 DIAGNOSIS — E43 Unspecified severe protein-calorie malnutrition: Secondary | ICD-10-CM | POA: Diagnosis not present

## 2016-03-24 DIAGNOSIS — J690 Pneumonitis due to inhalation of food and vomit: Secondary | ICD-10-CM | POA: Diagnosis not present

## 2016-03-24 DIAGNOSIS — K3184 Gastroparesis: Secondary | ICD-10-CM | POA: Diagnosis not present

## 2016-03-24 DIAGNOSIS — L97429 Non-pressure chronic ulcer of left heel and midfoot with unspecified severity: Secondary | ICD-10-CM | POA: Diagnosis not present

## 2016-03-24 DIAGNOSIS — I10 Essential (primary) hypertension: Secondary | ICD-10-CM | POA: Diagnosis not present

## 2016-03-24 DIAGNOSIS — R1310 Dysphagia, unspecified: Secondary | ICD-10-CM | POA: Diagnosis not present

## 2016-03-24 DIAGNOSIS — M10041 Idiopathic gout, right hand: Secondary | ICD-10-CM | POA: Diagnosis not present

## 2016-03-24 DIAGNOSIS — E1143 Type 2 diabetes mellitus with diabetic autonomic (poly)neuropathy: Secondary | ICD-10-CM | POA: Diagnosis not present

## 2016-03-24 DIAGNOSIS — G309 Alzheimer's disease, unspecified: Secondary | ICD-10-CM | POA: Diagnosis not present

## 2016-03-24 DIAGNOSIS — L89153 Pressure ulcer of sacral region, stage 3: Secondary | ICD-10-CM | POA: Diagnosis not present

## 2016-03-25 ENCOUNTER — Telehealth: Payer: Self-pay | Admitting: Internal Medicine

## 2016-03-25 DIAGNOSIS — E1143 Type 2 diabetes mellitus with diabetic autonomic (poly)neuropathy: Secondary | ICD-10-CM | POA: Diagnosis not present

## 2016-03-25 DIAGNOSIS — I10 Essential (primary) hypertension: Secondary | ICD-10-CM | POA: Diagnosis not present

## 2016-03-25 DIAGNOSIS — K3184 Gastroparesis: Secondary | ICD-10-CM | POA: Diagnosis not present

## 2016-03-25 DIAGNOSIS — J690 Pneumonitis due to inhalation of food and vomit: Secondary | ICD-10-CM | POA: Diagnosis not present

## 2016-03-25 DIAGNOSIS — M10041 Idiopathic gout, right hand: Secondary | ICD-10-CM | POA: Diagnosis not present

## 2016-03-25 DIAGNOSIS — E1152 Type 2 diabetes mellitus with diabetic peripheral angiopathy with gangrene: Secondary | ICD-10-CM | POA: Diagnosis not present

## 2016-03-25 DIAGNOSIS — L89153 Pressure ulcer of sacral region, stage 3: Secondary | ICD-10-CM | POA: Diagnosis not present

## 2016-03-25 DIAGNOSIS — L89893 Pressure ulcer of other site, stage 3: Secondary | ICD-10-CM | POA: Diagnosis not present

## 2016-03-25 DIAGNOSIS — R1310 Dysphagia, unspecified: Secondary | ICD-10-CM | POA: Diagnosis not present

## 2016-03-25 DIAGNOSIS — G309 Alzheimer's disease, unspecified: Secondary | ICD-10-CM | POA: Diagnosis not present

## 2016-03-25 DIAGNOSIS — E43 Unspecified severe protein-calorie malnutrition: Secondary | ICD-10-CM | POA: Diagnosis not present

## 2016-03-25 DIAGNOSIS — L97429 Non-pressure chronic ulcer of left heel and midfoot with unspecified severity: Secondary | ICD-10-CM | POA: Diagnosis not present

## 2016-03-25 NOTE — Telephone Encounter (Signed)
Shari Mcmahon is calling for verbals on PT for one visit recert next week

## 2016-03-28 DIAGNOSIS — Z9181 History of falling: Secondary | ICD-10-CM | POA: Diagnosis not present

## 2016-03-28 DIAGNOSIS — L97423 Non-pressure chronic ulcer of left heel and midfoot with necrosis of muscle: Secondary | ICD-10-CM | POA: Diagnosis not present

## 2016-03-29 DIAGNOSIS — K3184 Gastroparesis: Secondary | ICD-10-CM | POA: Diagnosis not present

## 2016-03-29 DIAGNOSIS — E43 Unspecified severe protein-calorie malnutrition: Secondary | ICD-10-CM | POA: Diagnosis not present

## 2016-03-29 DIAGNOSIS — I10 Essential (primary) hypertension: Secondary | ICD-10-CM | POA: Diagnosis not present

## 2016-03-29 DIAGNOSIS — M150 Primary generalized (osteo)arthritis: Secondary | ICD-10-CM | POA: Diagnosis not present

## 2016-03-29 DIAGNOSIS — E1151 Type 2 diabetes mellitus with diabetic peripheral angiopathy without gangrene: Secondary | ICD-10-CM | POA: Diagnosis not present

## 2016-03-29 DIAGNOSIS — G309 Alzheimer's disease, unspecified: Secondary | ICD-10-CM | POA: Diagnosis not present

## 2016-03-29 DIAGNOSIS — M10041 Idiopathic gout, right hand: Secondary | ICD-10-CM | POA: Diagnosis not present

## 2016-03-29 DIAGNOSIS — Z4800 Encounter for change or removal of nonsurgical wound dressing: Secondary | ICD-10-CM | POA: Diagnosis not present

## 2016-03-29 DIAGNOSIS — E1143 Type 2 diabetes mellitus with diabetic autonomic (poly)neuropathy: Secondary | ICD-10-CM | POA: Diagnosis not present

## 2016-03-29 DIAGNOSIS — L89153 Pressure ulcer of sacral region, stage 3: Secondary | ICD-10-CM | POA: Diagnosis not present

## 2016-03-30 ENCOUNTER — Telehealth: Payer: Self-pay | Admitting: Internal Medicine

## 2016-03-30 NOTE — Telephone Encounter (Signed)
[facility] [employer] as requested

## 2016-03-30 NOTE — Telephone Encounter (Signed)
Shari Mcmahon is calling to get verbals on PT   2 week/ 6  Home PT

## 2016-03-30 NOTE — Telephone Encounter (Signed)
Please advise 

## 2016-03-30 NOTE — Telephone Encounter (Signed)
Called and unable to reach left message to give [place] a call back

## 2016-04-01 DIAGNOSIS — E1143 Type 2 diabetes mellitus with diabetic autonomic (poly)neuropathy: Secondary | ICD-10-CM | POA: Diagnosis not present

## 2016-04-01 DIAGNOSIS — G309 Alzheimer's disease, unspecified: Secondary | ICD-10-CM | POA: Diagnosis not present

## 2016-04-01 DIAGNOSIS — Z4800 Encounter for change or removal of nonsurgical wound dressing: Secondary | ICD-10-CM | POA: Diagnosis not present

## 2016-04-01 DIAGNOSIS — E43 Unspecified severe protein-calorie malnutrition: Secondary | ICD-10-CM | POA: Diagnosis not present

## 2016-04-01 DIAGNOSIS — M10041 Idiopathic gout, right hand: Secondary | ICD-10-CM | POA: Diagnosis not present

## 2016-04-01 DIAGNOSIS — M150 Primary generalized (osteo)arthritis: Secondary | ICD-10-CM | POA: Diagnosis not present

## 2016-04-01 DIAGNOSIS — I10 Essential (primary) hypertension: Secondary | ICD-10-CM | POA: Diagnosis not present

## 2016-04-01 DIAGNOSIS — L89153 Pressure ulcer of sacral region, stage 3: Secondary | ICD-10-CM | POA: Diagnosis not present

## 2016-04-01 DIAGNOSIS — E1151 Type 2 diabetes mellitus with diabetic peripheral angiopathy without gangrene: Secondary | ICD-10-CM | POA: Diagnosis not present

## 2016-04-01 DIAGNOSIS — K3184 Gastroparesis: Secondary | ICD-10-CM | POA: Diagnosis not present

## 2016-04-04 ENCOUNTER — Telehealth: Payer: Self-pay | Admitting: *Deleted

## 2016-04-04 DIAGNOSIS — M150 Primary generalized (osteo)arthritis: Secondary | ICD-10-CM | POA: Diagnosis not present

## 2016-04-04 DIAGNOSIS — L89153 Pressure ulcer of sacral region, stage 3: Secondary | ICD-10-CM | POA: Diagnosis not present

## 2016-04-04 DIAGNOSIS — G309 Alzheimer's disease, unspecified: Secondary | ICD-10-CM | POA: Diagnosis not present

## 2016-04-04 DIAGNOSIS — E43 Unspecified severe protein-calorie malnutrition: Secondary | ICD-10-CM | POA: Diagnosis not present

## 2016-04-04 DIAGNOSIS — Z4800 Encounter for change or removal of nonsurgical wound dressing: Secondary | ICD-10-CM | POA: Diagnosis not present

## 2016-04-04 DIAGNOSIS — K3184 Gastroparesis: Secondary | ICD-10-CM | POA: Diagnosis not present

## 2016-04-04 DIAGNOSIS — I10 Essential (primary) hypertension: Secondary | ICD-10-CM | POA: Diagnosis not present

## 2016-04-04 DIAGNOSIS — M10041 Idiopathic gout, right hand: Secondary | ICD-10-CM | POA: Diagnosis not present

## 2016-04-04 DIAGNOSIS — E1143 Type 2 diabetes mellitus with diabetic autonomic (poly)neuropathy: Secondary | ICD-10-CM | POA: Diagnosis not present

## 2016-04-04 DIAGNOSIS — E1151 Type 2 diabetes mellitus with diabetic peripheral angiopathy without gangrene: Secondary | ICD-10-CM | POA: Diagnosis not present

## 2016-04-04 NOTE — Telephone Encounter (Signed)
Left  msg on triage requesting verbal to continue seeing pt 2 x's a week for next 6 weeks. Called [name] back no answer LMOM verbal is ok...Johny Chess

## 2016-04-05 ENCOUNTER — Telehealth: Payer: Self-pay | Admitting: Internal Medicine

## 2016-04-05 DIAGNOSIS — E43 Unspecified severe protein-calorie malnutrition: Secondary | ICD-10-CM | POA: Diagnosis not present

## 2016-04-05 DIAGNOSIS — Z4800 Encounter for change or removal of nonsurgical wound dressing: Secondary | ICD-10-CM | POA: Diagnosis not present

## 2016-04-05 DIAGNOSIS — E1151 Type 2 diabetes mellitus with diabetic peripheral angiopathy without gangrene: Secondary | ICD-10-CM | POA: Diagnosis not present

## 2016-04-05 DIAGNOSIS — E1143 Type 2 diabetes mellitus with diabetic autonomic (poly)neuropathy: Secondary | ICD-10-CM | POA: Diagnosis not present

## 2016-04-05 DIAGNOSIS — M10041 Idiopathic gout, right hand: Secondary | ICD-10-CM | POA: Diagnosis not present

## 2016-04-05 DIAGNOSIS — K3184 Gastroparesis: Secondary | ICD-10-CM | POA: Diagnosis not present

## 2016-04-05 DIAGNOSIS — I10 Essential (primary) hypertension: Secondary | ICD-10-CM | POA: Diagnosis not present

## 2016-04-05 DIAGNOSIS — G309 Alzheimer's disease, unspecified: Secondary | ICD-10-CM | POA: Diagnosis not present

## 2016-04-05 DIAGNOSIS — L89153 Pressure ulcer of sacral region, stage 3: Secondary | ICD-10-CM | POA: Diagnosis not present

## 2016-04-05 DIAGNOSIS — M150 Primary generalized (osteo)arthritis: Secondary | ICD-10-CM | POA: Diagnosis not present

## 2016-04-05 NOTE — Telephone Encounter (Signed)
Please advise, called to let patient know that you were out of the office this week and would not be in until next week. Unable to reach so left message

## 2016-04-05 NOTE — Telephone Encounter (Signed)
Patients daughter is asking that we send a prescription for a rolling walker faxed to [phone]

## 2016-04-07 DIAGNOSIS — M10041 Idiopathic gout, right hand: Secondary | ICD-10-CM | POA: Diagnosis not present

## 2016-04-07 DIAGNOSIS — Z4800 Encounter for change or removal of nonsurgical wound dressing: Secondary | ICD-10-CM | POA: Diagnosis not present

## 2016-04-07 DIAGNOSIS — K3184 Gastroparesis: Secondary | ICD-10-CM | POA: Diagnosis not present

## 2016-04-07 DIAGNOSIS — M150 Primary generalized (osteo)arthritis: Secondary | ICD-10-CM | POA: Diagnosis not present

## 2016-04-07 DIAGNOSIS — G309 Alzheimer's disease, unspecified: Secondary | ICD-10-CM | POA: Diagnosis not present

## 2016-04-07 DIAGNOSIS — E43 Unspecified severe protein-calorie malnutrition: Secondary | ICD-10-CM | POA: Diagnosis not present

## 2016-04-07 DIAGNOSIS — E1143 Type 2 diabetes mellitus with diabetic autonomic (poly)neuropathy: Secondary | ICD-10-CM | POA: Diagnosis not present

## 2016-04-07 DIAGNOSIS — E1151 Type 2 diabetes mellitus with diabetic peripheral angiopathy without gangrene: Secondary | ICD-10-CM | POA: Diagnosis not present

## 2016-04-07 DIAGNOSIS — I10 Essential (primary) hypertension: Secondary | ICD-10-CM | POA: Diagnosis not present

## 2016-04-07 DIAGNOSIS — L89153 Pressure ulcer of sacral region, stage 3: Secondary | ICD-10-CM | POA: Diagnosis not present

## 2016-04-08 NOTE — Telephone Encounter (Signed)
Done hardcopy to Corinne  

## 2016-04-10 DIAGNOSIS — Z515 Encounter for palliative care: Secondary | ICD-10-CM | POA: Insufficient documentation

## 2016-04-10 DIAGNOSIS — R531 Weakness: Secondary | ICD-10-CM | POA: Insufficient documentation

## 2016-04-12 DIAGNOSIS — E1151 Type 2 diabetes mellitus with diabetic peripheral angiopathy without gangrene: Secondary | ICD-10-CM | POA: Diagnosis not present

## 2016-04-12 DIAGNOSIS — M10041 Idiopathic gout, right hand: Secondary | ICD-10-CM | POA: Diagnosis not present

## 2016-04-12 DIAGNOSIS — E1143 Type 2 diabetes mellitus with diabetic autonomic (poly)neuropathy: Secondary | ICD-10-CM | POA: Diagnosis not present

## 2016-04-12 DIAGNOSIS — Z4800 Encounter for change or removal of nonsurgical wound dressing: Secondary | ICD-10-CM | POA: Diagnosis not present

## 2016-04-12 DIAGNOSIS — E43 Unspecified severe protein-calorie malnutrition: Secondary | ICD-10-CM | POA: Diagnosis not present

## 2016-04-12 DIAGNOSIS — M150 Primary generalized (osteo)arthritis: Secondary | ICD-10-CM | POA: Diagnosis not present

## 2016-04-12 DIAGNOSIS — L89153 Pressure ulcer of sacral region, stage 3: Secondary | ICD-10-CM | POA: Diagnosis not present

## 2016-04-12 DIAGNOSIS — I10 Essential (primary) hypertension: Secondary | ICD-10-CM | POA: Diagnosis not present

## 2016-04-12 DIAGNOSIS — K3184 Gastroparesis: Secondary | ICD-10-CM | POA: Diagnosis not present

## 2016-04-12 DIAGNOSIS — G309 Alzheimer's disease, unspecified: Secondary | ICD-10-CM | POA: Diagnosis not present

## 2016-04-12 NOTE — Telephone Encounter (Signed)
Prescription faxed

## 2016-04-14 DIAGNOSIS — L89153 Pressure ulcer of sacral region, stage 3: Secondary | ICD-10-CM | POA: Diagnosis not present

## 2016-04-14 DIAGNOSIS — E43 Unspecified severe protein-calorie malnutrition: Secondary | ICD-10-CM | POA: Diagnosis not present

## 2016-04-14 DIAGNOSIS — M150 Primary generalized (osteo)arthritis: Secondary | ICD-10-CM | POA: Diagnosis not present

## 2016-04-14 DIAGNOSIS — E119 Type 2 diabetes mellitus without complications: Secondary | ICD-10-CM | POA: Diagnosis not present

## 2016-04-14 DIAGNOSIS — L97423 Non-pressure chronic ulcer of left heel and midfoot with necrosis of muscle: Secondary | ICD-10-CM | POA: Diagnosis not present

## 2016-04-14 DIAGNOSIS — S72001D Fracture of unspecified part of neck of right femur, subsequent encounter for closed fracture with routine healing: Secondary | ICD-10-CM | POA: Diagnosis not present

## 2016-04-14 DIAGNOSIS — G309 Alzheimer's disease, unspecified: Secondary | ICD-10-CM | POA: Diagnosis not present

## 2016-04-14 DIAGNOSIS — M10041 Idiopathic gout, right hand: Secondary | ICD-10-CM | POA: Diagnosis not present

## 2016-04-14 DIAGNOSIS — I10 Essential (primary) hypertension: Secondary | ICD-10-CM | POA: Diagnosis not present

## 2016-04-14 DIAGNOSIS — Z9181 History of falling: Secondary | ICD-10-CM | POA: Diagnosis not present

## 2016-04-14 DIAGNOSIS — E1143 Type 2 diabetes mellitus with diabetic autonomic (poly)neuropathy: Secondary | ICD-10-CM | POA: Diagnosis not present

## 2016-04-14 DIAGNOSIS — Z4800 Encounter for change or removal of nonsurgical wound dressing: Secondary | ICD-10-CM | POA: Diagnosis not present

## 2016-04-14 DIAGNOSIS — K3184 Gastroparesis: Secondary | ICD-10-CM | POA: Diagnosis not present

## 2016-04-14 DIAGNOSIS — E1151 Type 2 diabetes mellitus with diabetic peripheral angiopathy without gangrene: Secondary | ICD-10-CM | POA: Diagnosis not present

## 2016-04-19 ENCOUNTER — Telehealth: Payer: Self-pay | Admitting: Internal Medicine

## 2016-04-19 NOTE — Telephone Encounter (Signed)
Shari Mcmahon dropped off [employer] [facility] paperwork to be ready for her appt next week. Sending to dahlia for completion

## 2016-04-20 ENCOUNTER — Telehealth: Payer: Self-pay

## 2016-04-20 DIAGNOSIS — M10041 Idiopathic gout, right hand: Secondary | ICD-10-CM | POA: Diagnosis not present

## 2016-04-20 DIAGNOSIS — K3184 Gastroparesis: Secondary | ICD-10-CM | POA: Diagnosis not present

## 2016-04-20 DIAGNOSIS — L89153 Pressure ulcer of sacral region, stage 3: Secondary | ICD-10-CM | POA: Diagnosis not present

## 2016-04-20 DIAGNOSIS — M150 Primary generalized (osteo)arthritis: Secondary | ICD-10-CM | POA: Diagnosis not present

## 2016-04-20 DIAGNOSIS — Z4800 Encounter for change or removal of nonsurgical wound dressing: Secondary | ICD-10-CM | POA: Diagnosis not present

## 2016-04-20 DIAGNOSIS — E1151 Type 2 diabetes mellitus with diabetic peripheral angiopathy without gangrene: Secondary | ICD-10-CM | POA: Diagnosis not present

## 2016-04-20 DIAGNOSIS — E1143 Type 2 diabetes mellitus with diabetic autonomic (poly)neuropathy: Secondary | ICD-10-CM | POA: Diagnosis not present

## 2016-04-20 DIAGNOSIS — G309 Alzheimer's disease, unspecified: Secondary | ICD-10-CM | POA: Diagnosis not present

## 2016-04-20 DIAGNOSIS — I10 Essential (primary) hypertension: Secondary | ICD-10-CM | POA: Diagnosis not present

## 2016-04-20 DIAGNOSIS — E43 Unspecified severe protein-calorie malnutrition: Secondary | ICD-10-CM | POA: Diagnosis not present

## 2016-04-20 NOTE — Telephone Encounter (Signed)
Paperwork partially filled out, placed on MD desk for completion pending appt (Please follow up with pt regarding appt, nothing scheduled at this time)

## 2016-04-20 NOTE — Telephone Encounter (Signed)
Home Health Cert/Plan of Care received (03/30/2016 - 05/28/2016) signed.  Faxed, copy sent to scan

## 2016-04-20 NOTE — Telephone Encounter (Signed)
Correction: Paperwork given to Dr Gwynn Burly assistant Corrine to follow up with pt regarding appt

## 2016-04-21 NOTE — Telephone Encounter (Signed)
Paperwork completed, signed, and copy sent to scan.  Original placed in cabinet for pick up, pt's daughter advised of same

## 2016-04-26 DIAGNOSIS — E1143 Type 2 diabetes mellitus with diabetic autonomic (poly)neuropathy: Secondary | ICD-10-CM | POA: Diagnosis not present

## 2016-04-26 DIAGNOSIS — L89153 Pressure ulcer of sacral region, stage 3: Secondary | ICD-10-CM | POA: Diagnosis not present

## 2016-04-26 DIAGNOSIS — I10 Essential (primary) hypertension: Secondary | ICD-10-CM | POA: Diagnosis not present

## 2016-04-26 DIAGNOSIS — K3184 Gastroparesis: Secondary | ICD-10-CM | POA: Diagnosis not present

## 2016-04-26 DIAGNOSIS — M150 Primary generalized (osteo)arthritis: Secondary | ICD-10-CM | POA: Diagnosis not present

## 2016-04-26 DIAGNOSIS — G309 Alzheimer's disease, unspecified: Secondary | ICD-10-CM | POA: Diagnosis not present

## 2016-04-26 DIAGNOSIS — Z4800 Encounter for change or removal of nonsurgical wound dressing: Secondary | ICD-10-CM | POA: Diagnosis not present

## 2016-04-26 DIAGNOSIS — E43 Unspecified severe protein-calorie malnutrition: Secondary | ICD-10-CM | POA: Diagnosis not present

## 2016-04-26 DIAGNOSIS — M10041 Idiopathic gout, right hand: Secondary | ICD-10-CM | POA: Diagnosis not present

## 2016-04-26 DIAGNOSIS — E1151 Type 2 diabetes mellitus with diabetic peripheral angiopathy without gangrene: Secondary | ICD-10-CM | POA: Diagnosis not present

## 2016-04-27 ENCOUNTER — Telehealth: Payer: Self-pay

## 2016-04-27 NOTE — Telephone Encounter (Signed)
Attempted to return call to pt's. Daughter, Hulda Marin, in response to VM left on the Triage nurse line about concerns that pt. Has a blood clot on tip of toe, and questions need to f/u earlier than August.  [facility] nurse back to discuss symptoms.

## 2016-04-28 ENCOUNTER — Telehealth: Payer: Self-pay

## 2016-04-28 DIAGNOSIS — Z9181 History of falling: Secondary | ICD-10-CM | POA: Diagnosis not present

## 2016-04-28 DIAGNOSIS — L97423 Non-pressure chronic ulcer of left heel and midfoot with necrosis of muscle: Secondary | ICD-10-CM | POA: Diagnosis not present

## 2016-04-28 NOTE — Telephone Encounter (Signed)
Phone call from pt's. Daughter.  Reported a small area on the tip of pt's right great toe, that appears like a blood clot.  Denied any swelling of the toe.  Reported "some ankle swelling".   Described that there is a "tiny line that looks like a blood blister, across the tip of the right great toe with a little redness."  Denied any drainage.  Denied fever/ chills.  Stated "she has Neuropathy", so she c/o that her feet hurt.  Daughter related that the condition of her toes has really improved from when she was in the nursing home.  Advised will move pt's appt. to an earlier date, to evaluate status of her right foot toes.  Daughter encouraged to call office sooner if any worsening of symptoms.  Agreed.

## 2016-04-28 NOTE — Telephone Encounter (Signed)
Spoke to daughter. Offered 05/06/16. She said they had another appt that day, resched for 05/11/16 at 10:00.

## 2016-05-04 DIAGNOSIS — Z4800 Encounter for change or removal of nonsurgical wound dressing: Secondary | ICD-10-CM | POA: Diagnosis not present

## 2016-05-04 DIAGNOSIS — M150 Primary generalized (osteo)arthritis: Secondary | ICD-10-CM | POA: Diagnosis not present

## 2016-05-04 DIAGNOSIS — E1151 Type 2 diabetes mellitus with diabetic peripheral angiopathy without gangrene: Secondary | ICD-10-CM | POA: Diagnosis not present

## 2016-05-04 DIAGNOSIS — L89153 Pressure ulcer of sacral region, stage 3: Secondary | ICD-10-CM | POA: Diagnosis not present

## 2016-05-04 DIAGNOSIS — G309 Alzheimer's disease, unspecified: Secondary | ICD-10-CM | POA: Diagnosis not present

## 2016-05-04 DIAGNOSIS — I10 Essential (primary) hypertension: Secondary | ICD-10-CM | POA: Diagnosis not present

## 2016-05-04 DIAGNOSIS — E43 Unspecified severe protein-calorie malnutrition: Secondary | ICD-10-CM | POA: Diagnosis not present

## 2016-05-04 DIAGNOSIS — M10041 Idiopathic gout, right hand: Secondary | ICD-10-CM | POA: Diagnosis not present

## 2016-05-04 DIAGNOSIS — K3184 Gastroparesis: Secondary | ICD-10-CM | POA: Diagnosis not present

## 2016-05-04 DIAGNOSIS — E1143 Type 2 diabetes mellitus with diabetic autonomic (poly)neuropathy: Secondary | ICD-10-CM | POA: Diagnosis not present

## 2016-05-06 ENCOUNTER — Encounter: Payer: Self-pay | Admitting: Vascular Surgery

## 2016-05-06 DIAGNOSIS — M10041 Idiopathic gout, right hand: Secondary | ICD-10-CM | POA: Diagnosis not present

## 2016-05-06 DIAGNOSIS — E1151 Type 2 diabetes mellitus with diabetic peripheral angiopathy without gangrene: Secondary | ICD-10-CM | POA: Diagnosis not present

## 2016-05-06 DIAGNOSIS — Z4800 Encounter for change or removal of nonsurgical wound dressing: Secondary | ICD-10-CM | POA: Diagnosis not present

## 2016-05-06 DIAGNOSIS — E1143 Type 2 diabetes mellitus with diabetic autonomic (poly)neuropathy: Secondary | ICD-10-CM | POA: Diagnosis not present

## 2016-05-06 DIAGNOSIS — L89153 Pressure ulcer of sacral region, stage 3: Secondary | ICD-10-CM | POA: Diagnosis not present

## 2016-05-06 DIAGNOSIS — I10 Essential (primary) hypertension: Secondary | ICD-10-CM | POA: Diagnosis not present

## 2016-05-06 DIAGNOSIS — E43 Unspecified severe protein-calorie malnutrition: Secondary | ICD-10-CM | POA: Diagnosis not present

## 2016-05-06 DIAGNOSIS — K3184 Gastroparesis: Secondary | ICD-10-CM | POA: Diagnosis not present

## 2016-05-06 DIAGNOSIS — G309 Alzheimer's disease, unspecified: Secondary | ICD-10-CM | POA: Diagnosis not present

## 2016-05-06 DIAGNOSIS — M150 Primary generalized (osteo)arthritis: Secondary | ICD-10-CM | POA: Diagnosis not present

## 2016-05-11 ENCOUNTER — Encounter: Payer: Self-pay | Admitting: Vascular Surgery

## 2016-05-11 ENCOUNTER — Ambulatory Visit (INDEPENDENT_AMBULATORY_CARE_PROVIDER_SITE_OTHER): Payer: Medicare Other | Admitting: Vascular Surgery

## 2016-05-11 ENCOUNTER — Emergency Department (HOSPITAL_COMMUNITY)
Admission: EM | Admit: 2016-05-11 | Discharge: 2016-05-11 | Disposition: A | Discharge status: Home / Self Care | Payer: Medicare Other | Attending: Emergency Medicine | Admitting: Emergency Medicine

## 2016-05-11 ENCOUNTER — Emergency Department (HOSPITAL_COMMUNITY): Payer: Medicare Other

## 2016-05-11 VITALS — BP 133/47 | HR 48 | Temp 97.4°F | Resp 12 | Ht 65.0 in | Wt 134.0 lb

## 2016-05-11 DIAGNOSIS — I70263 Atherosclerosis of native arteries of extremities with gangrene, bilateral legs: Secondary | ICD-10-CM

## 2016-05-11 DIAGNOSIS — I7025 Atherosclerosis of native arteries of other extremities with ulceration: Secondary | ICD-10-CM | POA: Diagnosis not present

## 2016-05-11 DIAGNOSIS — I10 Essential (primary) hypertension: Secondary | ICD-10-CM | POA: Insufficient documentation

## 2016-05-11 DIAGNOSIS — M7989 Other specified soft tissue disorders: Secondary | ICD-10-CM | POA: Diagnosis not present

## 2016-05-11 DIAGNOSIS — E1143 Type 2 diabetes mellitus with diabetic autonomic (poly)neuropathy: Secondary | ICD-10-CM | POA: Insufficient documentation

## 2016-05-11 DIAGNOSIS — M79671 Pain in right foot: Secondary | ICD-10-CM | POA: Diagnosis not present

## 2016-05-11 DIAGNOSIS — E1140 Type 2 diabetes mellitus with diabetic neuropathy, unspecified: Secondary | ICD-10-CM | POA: Insufficient documentation

## 2016-05-11 DIAGNOSIS — M25571 Pain in right ankle and joints of right foot: Secondary | ICD-10-CM | POA: Insufficient documentation

## 2016-05-11 DIAGNOSIS — Z87891 Personal history of nicotine dependence: Secondary | ICD-10-CM | POA: Insufficient documentation

## 2016-05-11 DIAGNOSIS — Z85028 Personal history of other malignant neoplasm of stomach: Secondary | ICD-10-CM | POA: Insufficient documentation

## 2016-05-11 DIAGNOSIS — Z79899 Other long term (current) drug therapy: Secondary | ICD-10-CM | POA: Insufficient documentation

## 2016-05-11 HISTORY — DX: Atherosclerosis of native arteries of other extremities with ulceration: I70.25

## 2016-05-11 MED ORDER — ACETAMINOPHEN 500 MG PO TABS: 500.0000 mg | ORAL_TABLET | Freq: Four times a day (QID) | ORAL | Status: DC | PRN

## 2016-05-11 MED ORDER — ACETAMINOPHEN 325 MG PO TABS
325.0000 mg | ORAL_TABLET | Freq: Once | ORAL | Status: DC
  Filled 2016-05-11: qty 1

## 2016-05-11 NOTE — ED Notes (Signed)
Per [facility]- pt here from home. Pt reports she felt a pop to right foot three days ago. Pt went to vascular [facility] today and they did not do any Xray. Daughter wanted her sent here for Xray. Pt also reports right pinky toe pain as well as right foot pain. Pt reports unable to put weight on it.

## 2016-05-11 NOTE — Discharge Instructions (Signed)
You have been seen today for foot pain. Your imaging showed no acute abnormalities. Follow up with Dr. Sharol Given as soon as possible. Use the postop shoe as needed for comfort. Elevate the extremity to reduce any pain and swelling. Recommend use of ibuprofen or naproxen, if tolerated to reduce inflammation. Tylenol is also an option for pain relief. Take these medications with food to avoid upset stomach. Follow up with PCP as needed. Return to ED should symptoms worsen.

## 2016-05-11 NOTE — ED Notes (Signed)
Pt being transported to x-ray

## 2016-05-11 NOTE — Progress Notes (Signed)
[facility]  Patient name: Shari Mcmahon MRN: [id number] DOB: [birth year] Sex: female  REASON FOR VISIT: nonhealing wounds right foot.   HPI: Shari Mcmahon is a 82 y.o. female who I last saw on 02/24/2016. She has some necrotic toes on the right foot and ulceration of the left heel. She had evidence of severe tibial artery occlusive disease on exam and was not a candidate for revascularization. When I had discussed this with her before, she refused a more proximal amputation if needed and so we discussed amputation of the toes if they did not heal. She was scheduled to be seen back on August 2. The daughter notes that she injured her right fifth toe and therefore she comes in as an add on.   I do not get any clear-cut history of rest pain in her feet. She states that hanging the foot down does not improve her symptoms. This point she is nonambulatory. He denies fever or chills.  Past Medical History  Diagnosis Date  . ABDOMINAL PAIN, CHRONIC 04/07/2008  . ARTHRITIS 03/03/2008  . BRADYCARDIA, CHRONIC 12/17/2008  . CARDIAC MURMUR 02/01/2010  . CONSTIPATION, CHRONIC 10/15/2010  . DEGENERATIVE JOINT DISEASE, KNEES, BILATERAL 12/07/2007  . DEPRESSION 06/17/2009  . DIABETES MELLITUS, TYPE II 09/19/2007    DIET CONTROL   . DYSPHAGIA UNSPECIFIED 12/24/2009  . Gastroparesis 12/24/2009  . GENERALIZED OSTEOARTHROSIS INVOLVING HAND 10/03/2007  . GLAUCOMA 09/19/2007  . GOUTY ARTHROPATHY UNSPECIFIED 02/17/2009  . GOUT 09/19/2007  . HYPERLIPIDEMIA 01/27/2010  . HYPERTENSION 09/19/2007  . LUNG NODULE 03/03/2008  . MENOPAUSAL DISORDER 12/17/2008  . PERIPHERAL NEUROPATHY 06/19/2008  . PERSONAL HISTORY MALIGNANT NEOPLASM STOMACH 09/19/2007  . 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 STOMACH CANCER 03/03/2008  . UTI 12/15/2009  . Arthritis of knee, degenerative 10/19/2011    Family History  Problem Relation Age of Onset  . Uterine cancer Mother   . Stroke  Father     Hemorrhagic  . Cancer Sister     breast cancer and one sister with uterine cancer and one sister with ovarian cancerr  . Colon cancer Maternal Grandmother   . Colon cancer Paternal Grandfather   . Diabetes Other   . Stomach cancer Neg Hx   . Prostate cancer Brother     SOCIAL HISTORY: Social History  Substance Use Topics  . Smoking status: Former Smoker    Types: Cigarettes  . Smokeless tobacco: Never Used     Comment: use to smoke a pack of cigaretts a day for 25 yrs. but stopped 10 yrs. ago.   . Alcohol Use: No    Allergies  Allergen Reactions  . Voltaren [Diclofenac Sodium] Other (See Comments)    Stomach irritation  . Dulcolax [Bisacodyl]     Unknown   . Duloxetine Hcl Other (See Comments)    Made patient not want to eat or drink  . Penicillins Swelling    Unknown   . Timolol     REACTION: bradycardia worse    Current Outpatient Prescriptions  Medication Sig Dispense Refill  . acetaminophen (TYLENOL) 325 MG tablet Take 2 tablets (650 mg total) by mouth every 6 (six) hours as needed for mild pain, moderate pain, fever or headache.    . Amino Acids-Protein Hydrolys (FEEDING SUPPLEMENT, PRO-STAT SUGAR FREE 64,) LIQD Take 30 mLs by mouth 3 (three) times daily with meals. 900 mL prn  . amLODipine (NORVASC) 5 MG tablet Take 1 tablet (5 mg  total) by mouth daily. 90 tablet 3  . colchicine 0.6 MG tablet Take 1 tablet (0.6 mg total) by mouth daily as needed (Gout pain.). 30 tablet 11  . donepezil (ARICEPT) 10 MG tablet Take 1 tablet (10 mg total) by mouth at bedtime. Reported on 02/05/2016 90 tablet 3  . erythromycin ophthalmic ointment Place 1 application into both eyes 3 (three) times daily. 7 g 0  . febuxostat (ULORIC) 40 MG tablet Take 1 tablet (40 mg total) by mouth daily. Reported on 02/05/2016 90 tablet 3  . feeding supplement, ENSURE ENLIVE, (ENSURE ENLIVE) LIQD Take 237 mLs by mouth 2 (two) times daily between meals.    . lactose free nutrition (BOOST PLUS) LIQD  Take 237 mLs by mouth 3 (three) times daily between meals.    . latanoprost (XALATAN) 0.005 % ophthalmic solution Place 1 drop into both eyes at bedtime. Reported on 02/05/2016 2.5 mL 2  . memantine (NAMENDA XR) 28 MG CP24 24 hr capsule Take 1 capsule (28 mg total) by mouth daily. Reported on 02/05/2016 90 capsule 3  . Multiple Vitamin tablet Take 1 tablet by mouth daily. Reported on 02/05/2016    . nystatin-triamcinolone ointment (MYCOLOG) Apply 1 application topically 2 (two) times daily. 60 g 0  . omeprazole (PRILOSEC) 40 MG capsule Take 1 capsule (40 mg total) by mouth daily. 90 capsule 3  . senna (SENOKOT) 8.6 MG tablet Take 1 tablet by mouth 2 (two) times daily. Reported on 02/05/2016    . vitamin C (ASCORBIC ACID) 500 MG tablet Take 500 mg by mouth 2 (two) times daily. Reported on 02/05/2016     No current facility-administered medications for this visit.    REVIEW OF SYSTEMS:  [X]  denotes positive finding, [ ]  denotes negative finding Cardiac  Comments:  Chest pain or chest pressure:    Shortness of breath upon exertion:    Short of breath when lying flat:    Irregular heart rhythm:        Vascular    Pain in calf, thigh, or hip brought on by ambulation:    Pain in feet at night that wakes you up from your sleep:  X   Blood clot in your veins:    Leg swelling:         Pulmonary    Oxygen at home:    Productive cough:     Wheezing:         Neurologic    Sudden weakness in arms or legs:     Sudden numbness in arms or legs:     Sudden onset of difficulty speaking or slurred speech:    Temporary loss of vision in one eye:     Problems with dizziness:         Gastrointestinal    Blood in stool:     Vomited blood:         Genitourinary    Burning when urinating:     Blood in urine:        Psychiatric    Major depression:         Hematologic    Bleeding problems:    Problems with blood clotting too easily:        Skin    Rashes or ulcers:        Constitutional    Fever  or chills:      PHYSICAL EXAM: There were no vitals filed for this visit.  GENERAL: The patient is a well-nourished female, in  no acute distress. The vital signs are documented above. CARDIAC: There is a regular rate and rhythm.  VASCULAR: I do not detect carotid bruits. On the right side she has a palpable femoral and popliteal pulse. She has a dampened monophasic dorsalis pedis signal with a fairly brisk monophasic posterior tibial signal on the right. On the left side, she has a palpable femoral and popliteal pulse. She has a fairly brisk anterior tibial and posterior tibial signal although the signals are monophasic. EXTREMITIES: Currently there are no open wounds on the extremities with the exception of a very small wound on the distal aspect of the right fourth toe PULMONARY: There is good air exchange bilaterally without wheezing or rales.  MEDICAL ISSUES:  TIBIAL ARTERY OCCLUSIVE DISEASE: Currently the patient does not have any significant wounds on the feet except for very small wound on the right fourth toe. She has evidence of tibial artery occlusive disease and is not a candidate for revascularization. I think she has adequate circulation at this point. I'll plan on seeing her back in 6 months. She knows to call sooner if she has problems.    Deitra Mayo [facility] [place] [phone]

## 2016-05-11 NOTE — ED Notes (Signed)
Pt returned to room from xray.

## 2016-05-11 NOTE — ED Provider Notes (Signed)
CSN: [id number]     Arrival date & time 05/11/16  1137 History   First MD Initiated Contact with Patient 05/11/16 1154     Chief Complaint  Patient presents with  . Foot Pain     (Consider location/radiation/quality/duration/timing/severity/associated sxs/prior Treatment) HPI   Shari Mcmahon is a 82 y.o. female, with a history of DM, arthritis, degenerative joint disease, and stomach cancer, presenting to the ED with Right foot and ankle pain that began 2 days ago. Patient states she was walking with her walker and heard a "pop" in the right foot, specifically in the right small toe. Patient states she has had pain with weightbearing since then. Patient regularly sees Dr. Scot Dock, vascular specialist, and came directly from his office to obtain x-rays. Patient reports that the circulation in her right foot was tested by Dr. Scot Dock with no acute changes. Patient is accompanied by her daughter at the bedside. Patient has not tried any treatments for her pain. Daughter states patient was instructed to go to Dr. Jess Barters (orthopedic surgeon) directly from Dr. Nicole Cella office, but states that she felt she would not be able to get an appointment today. Patient denies falls, new neuro deficits, or any other complaints or injuries.   Past Medical History  Diagnosis Date  . ABDOMINAL PAIN, CHRONIC 04/07/2008  . ARTHRITIS 03/03/2008  . BRADYCARDIA, CHRONIC 12/17/2008  . CARDIAC MURMUR 02/01/2010  . CONSTIPATION, CHRONIC 10/15/2010  . DEGENERATIVE JOINT DISEASE, KNEES, BILATERAL 12/07/2007  . DEPRESSION 06/17/2009  . DIABETES MELLITUS, TYPE II 09/19/2007    DIET CONTROL   . DYSPHAGIA UNSPECIFIED 12/24/2009  . Gastroparesis 12/24/2009  . GENERALIZED OSTEOARTHROSIS INVOLVING HAND 10/03/2007  . GLAUCOMA 09/19/2007  . GOUTY ARTHROPATHY UNSPECIFIED 02/17/2009  . GOUT 09/19/2007  . HYPERLIPIDEMIA 01/27/2010  . HYPERTENSION 09/19/2007  . LUNG NODULE 03/03/2008  . MENOPAUSAL DISORDER 12/17/2008  . PERIPHERAL  NEUROPATHY 06/19/2008  . PERSONAL HISTORY MALIGNANT NEOPLASM STOMACH 09/19/2007  . 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 STOMACH CANCER 03/03/2008  . UTI 12/15/2009  . Arthritis of knee, degenerative 10/19/2011   Past Surgical History  Procedure Laterality Date  . Vagotomy    . Partial gastrectomy    . Billroth ii gastorenterostomy    . Colonoscopy     Family History  Problem Relation Age of Onset  . Uterine cancer Mother   . Stroke Father     Hemorrhagic  . Cancer Sister     breast cancer and one sister with uterine cancer and one sister with ovarian cancerr  . Colon cancer Maternal Grandmother   . Colon cancer Paternal Grandfather   . Diabetes Other   . Stomach cancer Neg Hx   . Prostate cancer Brother    Social History  Substance Use Topics  . Smoking status: Former Smoker    Types: Cigarettes  . Smokeless tobacco: Never Used     Comment: use to smoke a pack of cigaretts a day for 25 yrs. but stopped 10 yrs. ago.   . Alcohol Use: No   OB History    No data available     Review of Systems  Constitutional: Negative for fever and chills.  Musculoskeletal: Positive for arthralgias. Negative for joint swelling.  Neurological: Negative for weakness and numbness.      Allergies  Voltaren; Dulcolax; Duloxetine hcl; Penicillins; and Timolol  Home Medications   Prior to Admission medications   Medication Sig Start Date End Date Taking? Authorizing Provider  acetaminophen (TYLENOL) 500  MG tablet Take 1 tablet (500 mg total) by mouth every 6 (six) hours as needed. 05/11/16   Shawn C Joy, PA-C  Amino Acids-Protein Hydrolys (FEEDING SUPPLEMENT, PRO-STAT SUGAR FREE 64,) LIQD Take 30 mLs by mouth 3 (three) times daily with meals. 11/15/15   Gerlene Fee, NP  amLODipine (NORVASC) 5 MG tablet Take 1 tablet (5 mg total) by mouth daily. 02/26/14 02/09/17  Biagio Borg, MD  colchicine 0.6 MG tablet Take 1 tablet (0.6 mg total) by mouth daily as needed (Gout  pain.). 02/05/16   Biagio Borg, MD  donepezil (ARICEPT) 10 MG tablet Take 1 tablet (10 mg total) by mouth at bedtime. Reported on 02/05/2016 02/05/16   Biagio Borg, MD  erythromycin ophthalmic ointment Place 1 application into both eyes 3 (three) times daily. 02/19/16   Hoyt Koch, MD  febuxostat (ULORIC) 40 MG tablet Take 1 tablet (40 mg total) by mouth daily. Reported on 02/05/2016 02/05/16   Biagio Borg, MD  feeding supplement, ENSURE ENLIVE, (ENSURE ENLIVE) LIQD Take 237 mLs by mouth 2 (two) times daily between meals. 01/29/16   Modena Jansky, MD  lactose free nutrition (BOOST PLUS) LIQD Take 237 mLs by mouth 3 (three) times daily between meals. 01/29/16   Modena Jansky, MD  latanoprost (XALATAN) 0.005 % ophthalmic solution Place 1 drop into both eyes at bedtime. Reported on 02/05/2016 02/05/16   Biagio Borg, MD  memantine (NAMENDA XR) 28 MG CP24 24 hr capsule Take 1 capsule (28 mg total) by mouth daily. Reported on 02/05/2016 02/05/16   Biagio Borg, MD  Multiple Vitamin tablet Take 1 tablet by mouth daily. Reported on 05/11/2016    Historical Provider, MD  nystatin-triamcinolone ointment (MYCOLOG) Apply 1 application topically 2 (two) times daily. Patient not taking: Reported on 05/11/2016 02/19/16   Hoyt Koch, MD  omeprazole (PRILOSEC) 40 MG capsule Take 1 capsule (40 mg total) by mouth daily. 02/05/16   Biagio Borg, MD  senna (SENOKOT) 8.6 MG tablet Take 1 tablet by mouth 2 (two) times daily. Reported on 02/05/2016    Historical Provider, MD  vitamin C (ASCORBIC ACID) 500 MG tablet Take 500 mg by mouth 2 (two) times daily. Reported on 05/11/2016    Historical Provider, MD   BP 156/52 mmHg  Pulse 46  Temp(Src) 97.8 F (36.6 C)  Resp 18  SpO2 100% Physical Exam  Constitutional: She appears well-developed and well-nourished. No distress.  HENT:  Head: Normocephalic and atraumatic.  Eyes: Conjunctivae are normal.  Neck: Neck supple.  Cardiovascular: Normal rate, regular rhythm and  intact distal pulses.   Circulation in the form of capillary refill is intact.  Pulmonary/Chest: Effort normal.  Musculoskeletal: She exhibits tenderness.  Tenderness to the right medial ankle, medial foot, and right small toe. No deformity or swelling noted. Range of motion in the right ankle and toes is intact, but painful.  Healing wounds noted on the dorsum of the right foot and toes.  Neurological: She is alert.  Decreased sensation in the right toes that the patient states is chronic.  Skin: Skin is warm and dry. She is not diaphoretic.  Psychiatric: She has a normal mood and affect. Her behavior is normal.  Nursing note and vitals reviewed.   ED Course  Procedures (including critical care time)  Imaging Review Dg Ankle Complete Right  05/11/2016  CLINICAL DATA:  Acute right ankle pain and swelling. EXAM: RIGHT ANKLE - COMPLETE 3+ VIEW COMPARISON:  None. FINDINGS: There is no evidence of fracture, dislocation, or joint effusion. There is no evidence of arthropathy or other focal bone abnormality. Vascular calcifications are noted. IMPRESSION: No acute abnormality seen in the right ankle. Electronically Signed   By: Marijo Conception, M.D.   On: 05/11/2016 13:05   Dg Foot Complete Right  05/11/2016  CLINICAL DATA:  Right foot and ankle pain. Felt a pop on Monday. Swelling all over. EXAM: RIGHT FOOT COMPLETE - 3+ VIEW COMPARISON:  None. FINDINGS: Diffuse severe osteopenia. Degenerative changes in IP and MTP joints. No acute bony abnormality. Specifically, no fracture, subluxation, or dislocation. Soft tissues are intact. IMPRESSION: No acute bony abnormality.  Severe diffuse osteopenia. Electronically Signed   By: Rolm Baptise M.D.   On: 05/11/2016 13:04   I have personally reviewed and evaluated these images as part of my medical decision-making.   EKG Interpretation None      MDM   Final diagnoses:  Right foot pain    MAYETTA JEUNE presents with right foot and ankle pain  for the last 2 days.  Findings and plan of care discussed with Virgel Manifold, MD.   Patient with pain from an acute event without new neuro deficits. X-rays show no acute abnormalities. Patient to follow up with Dr. Sharol Given as soon as possible. Postop shoe for comfort. The patient was given instructions for home care as well as return precautions. Patient voices understanding of these instructions, accepts the plan, and is comfortable with discharge.  Filed Vitals:   05/11/16 1140 05/11/16 1143 05/11/16 1200  BP: 147/60 147/60 156/52  Pulse: 50 50 46  Temp:  97.8 F (36.6 C)   Resp:  18   SpO2: 99% 99% 100%       Lorayne Bender, PA-C 05/11/16 [place], MD 05/13/16 [phone]

## 2016-05-12 DIAGNOSIS — E1143 Type 2 diabetes mellitus with diabetic autonomic (poly)neuropathy: Secondary | ICD-10-CM | POA: Diagnosis not present

## 2016-05-12 DIAGNOSIS — M150 Primary generalized (osteo)arthritis: Secondary | ICD-10-CM | POA: Diagnosis not present

## 2016-05-12 DIAGNOSIS — I10 Essential (primary) hypertension: Secondary | ICD-10-CM | POA: Diagnosis not present

## 2016-05-12 DIAGNOSIS — G309 Alzheimer's disease, unspecified: Secondary | ICD-10-CM | POA: Diagnosis not present

## 2016-05-12 DIAGNOSIS — L89153 Pressure ulcer of sacral region, stage 3: Secondary | ICD-10-CM | POA: Diagnosis not present

## 2016-05-12 DIAGNOSIS — M10041 Idiopathic gout, right hand: Secondary | ICD-10-CM | POA: Diagnosis not present

## 2016-05-12 DIAGNOSIS — E1151 Type 2 diabetes mellitus with diabetic peripheral angiopathy without gangrene: Secondary | ICD-10-CM | POA: Diagnosis not present

## 2016-05-12 DIAGNOSIS — E43 Unspecified severe protein-calorie malnutrition: Secondary | ICD-10-CM | POA: Diagnosis not present

## 2016-05-12 DIAGNOSIS — K3184 Gastroparesis: Secondary | ICD-10-CM | POA: Diagnosis not present

## 2016-05-12 DIAGNOSIS — Z4800 Encounter for change or removal of nonsurgical wound dressing: Secondary | ICD-10-CM | POA: Diagnosis not present

## 2016-05-13 DIAGNOSIS — M150 Primary generalized (osteo)arthritis: Secondary | ICD-10-CM | POA: Diagnosis not present

## 2016-05-13 DIAGNOSIS — E1151 Type 2 diabetes mellitus with diabetic peripheral angiopathy without gangrene: Secondary | ICD-10-CM | POA: Diagnosis not present

## 2016-05-13 DIAGNOSIS — K3184 Gastroparesis: Secondary | ICD-10-CM | POA: Diagnosis not present

## 2016-05-13 DIAGNOSIS — E1143 Type 2 diabetes mellitus with diabetic autonomic (poly)neuropathy: Secondary | ICD-10-CM | POA: Diagnosis not present

## 2016-05-13 DIAGNOSIS — E43 Unspecified severe protein-calorie malnutrition: Secondary | ICD-10-CM | POA: Diagnosis not present

## 2016-05-13 DIAGNOSIS — L89153 Pressure ulcer of sacral region, stage 3: Secondary | ICD-10-CM | POA: Diagnosis not present

## 2016-05-13 DIAGNOSIS — Z4800 Encounter for change or removal of nonsurgical wound dressing: Secondary | ICD-10-CM | POA: Diagnosis not present

## 2016-05-13 DIAGNOSIS — G309 Alzheimer's disease, unspecified: Secondary | ICD-10-CM | POA: Diagnosis not present

## 2016-05-13 DIAGNOSIS — I10 Essential (primary) hypertension: Secondary | ICD-10-CM | POA: Diagnosis not present

## 2016-05-13 DIAGNOSIS — M10041 Idiopathic gout, right hand: Secondary | ICD-10-CM | POA: Diagnosis not present

## 2016-05-16 ENCOUNTER — Telehealth: Payer: Self-pay | Admitting: Internal Medicine

## 2016-05-16 NOTE — Telephone Encounter (Signed)
Please consider OV since this will help to determine the need for antibx

## 2016-05-16 NOTE — Telephone Encounter (Signed)
Please advise 

## 2016-05-16 NOTE — Telephone Encounter (Signed)
Pt called in and said that she has a UTI and wants to know if she can bring a sample over and Dr Jenny Reichmann can put in a order ?

## 2016-05-17 NOTE — Telephone Encounter (Signed)
Roselyn Reef advised me that she spoke with the patient and has scheduled her an appointment

## 2016-05-17 NOTE — Telephone Encounter (Signed)
Unable to reach patient, left message to give [place] a call back.

## 2016-05-19 ENCOUNTER — Other Ambulatory Visit: Payer: Self-pay | Admitting: Internal Medicine

## 2016-05-19 ENCOUNTER — Telehealth: Payer: Self-pay

## 2016-05-19 NOTE — Telephone Encounter (Signed)
PA initiated via CoverMyMeds key [id number]

## 2016-05-24 ENCOUNTER — Ambulatory Visit: Payer: Medicare Other | Admitting: Internal Medicine

## 2016-05-24 ENCOUNTER — Emergency Department (HOSPITAL_COMMUNITY): Payer: Medicare Other

## 2016-05-24 ENCOUNTER — Encounter (HOSPITAL_COMMUNITY): Payer: Self-pay | Admitting: Emergency Medicine

## 2016-05-24 ENCOUNTER — Emergency Department (HOSPITAL_COMMUNITY)
Admission: EM | Admit: 2016-05-24 | Discharge: 2016-05-24 | Disposition: A | Discharge status: Home / Self Care | Payer: Medicare Other | Attending: Emergency Medicine | Admitting: Emergency Medicine

## 2016-05-24 DIAGNOSIS — I10 Essential (primary) hypertension: Secondary | ICD-10-CM | POA: Diagnosis not present

## 2016-05-24 DIAGNOSIS — R1084 Generalized abdominal pain: Secondary | ICD-10-CM | POA: Diagnosis not present

## 2016-05-24 DIAGNOSIS — F0390 Unspecified dementia without behavioral disturbance: Secondary | ICD-10-CM | POA: Insufficient documentation

## 2016-05-24 DIAGNOSIS — N390 Urinary tract infection, site not specified: Secondary | ICD-10-CM

## 2016-05-24 DIAGNOSIS — R079 Chest pain, unspecified: Secondary | ICD-10-CM | POA: Diagnosis not present

## 2016-05-24 DIAGNOSIS — Z7984 Long term (current) use of oral hypoglycemic drugs: Secondary | ICD-10-CM | POA: Insufficient documentation

## 2016-05-24 DIAGNOSIS — F329 Major depressive disorder, single episode, unspecified: Secondary | ICD-10-CM | POA: Diagnosis not present

## 2016-05-24 DIAGNOSIS — E119 Type 2 diabetes mellitus without complications: Secondary | ICD-10-CM | POA: Insufficient documentation

## 2016-05-24 DIAGNOSIS — Z79899 Other long term (current) drug therapy: Secondary | ICD-10-CM | POA: Insufficient documentation

## 2016-05-24 DIAGNOSIS — E785 Hyperlipidemia, unspecified: Secondary | ICD-10-CM | POA: Insufficient documentation

## 2016-05-24 DIAGNOSIS — Z87891 Personal history of nicotine dependence: Secondary | ICD-10-CM | POA: Diagnosis not present

## 2016-05-24 DIAGNOSIS — Z79891 Long term (current) use of opiate analgesic: Secondary | ICD-10-CM | POA: Insufficient documentation

## 2016-05-24 DIAGNOSIS — M179 Osteoarthritis of knee, unspecified: Secondary | ICD-10-CM | POA: Diagnosis not present

## 2016-05-24 DIAGNOSIS — R109 Unspecified abdominal pain: Secondary | ICD-10-CM | POA: Diagnosis present

## 2016-05-24 DIAGNOSIS — R1013 Epigastric pain: Secondary | ICD-10-CM | POA: Diagnosis not present

## 2016-05-24 LAB — I-STAT TROPONIN, ED: TROPONIN I, POC: 0.01 ng/mL (ref 0.00–0.08)

## 2016-05-24 LAB — URINALYSIS, ROUTINE W REFLEX MICROSCOPIC
BILIRUBIN URINE: NEGATIVE
Glucose, UA: NEGATIVE mg/dL
Ketones, ur: NEGATIVE mg/dL
Nitrite: NEGATIVE
Protein, ur: NEGATIVE mg/dL
SPECIFIC GRAVITY, URINE: 1.022 (ref 1.005–1.030)
pH: 6 (ref 5.0–8.0)

## 2016-05-24 LAB — CBC
HCT: 30.9 % — ABNORMAL LOW (ref 36.0–46.0)
HEMOGLOBIN: 10.5 g/dL — AB (ref 12.0–15.0)
MCH: 33.3 pg (ref 26.0–34.0)
MCHC: 34 g/dL (ref 30.0–36.0)
MCV: 98.1 fL (ref 78.0–100.0)
Platelets: 230 10*3/uL (ref 150–400)
RBC: 3.15 MIL/uL — AB (ref 3.87–5.11)
RDW: 16.3 % — ABNORMAL HIGH (ref 11.5–15.5)
WBC: 6.7 10*3/uL (ref 4.0–10.5)

## 2016-05-24 LAB — COMPREHENSIVE METABOLIC PANEL
ALT: 12 U/L — ABNORMAL LOW (ref 14–54)
AST: 24 U/L (ref 15–41)
Albumin: 3.1 g/dL — ABNORMAL LOW (ref 3.5–5.0)
Alkaline Phosphatase: 108 U/L (ref 38–126)
Anion gap: 5 (ref 5–15)
BILIRUBIN TOTAL: 0.3 mg/dL (ref 0.3–1.2)
BUN: 16 mg/dL (ref 6–20)
CHLORIDE: 111 mmol/L (ref 101–111)
CO2: 24 mmol/L (ref 22–32)
Calcium: 9 mg/dL (ref 8.9–10.3)
Creatinine, Ser: 0.79 mg/dL (ref 0.44–1.00)
Glucose, Bld: 73 mg/dL (ref 65–99)
POTASSIUM: 3.8 mmol/L (ref 3.5–5.1)
Sodium: 140 mmol/L (ref 135–145)
TOTAL PROTEIN: 6.6 g/dL (ref 6.5–8.1)

## 2016-05-24 LAB — I-STAT CG4 LACTIC ACID, ED
LACTIC ACID, VENOUS: 2.69 mmol/L — AB (ref 0.5–1.9)
Lactic Acid, Venous: 2.25 mmol/L (ref 0.5–1.9)

## 2016-05-24 LAB — URINE MICROSCOPIC-ADD ON

## 2016-05-24 LAB — LIPASE, BLOOD: LIPASE: 11 U/L (ref 11–51)

## 2016-05-24 MED ORDER — IOPAMIDOL (ISOVUE-300) INJECTION 61%
100.0000 mL | Freq: Once | INTRAVENOUS | Status: AC | PRN
  Administered 2016-05-24: 100 mL via INTRAVENOUS

## 2016-05-24 MED ORDER — SODIUM CHLORIDE 0.9 % IV BOLUS (SEPSIS)
500.0000 mL | Freq: Once | INTRAVENOUS | Status: AC
  Administered 2016-05-24: 500 mL via INTRAVENOUS

## 2016-05-24 MED ORDER — CIPROFLOXACIN HCL 500 MG PO TABS: 500.0000 mg | ORAL_TABLET | Freq: Two times a day (BID) | ORAL | 0 refills | Status: DC

## 2016-05-24 MED ORDER — MORPHINE SULFATE (PF) 2 MG/ML IV SOLN
2.0000 mg | INTRAVENOUS | Status: AC | PRN
  Administered 2016-05-24 (×2): 2 mg via INTRAVENOUS
  Filled 2016-05-24 (×2): qty 1

## 2016-05-24 MED ORDER — NYSTATIN-TRIAMCINOLONE 100000-0.1 UNIT/GM-% EX CREA: TOPICAL_CREAM | CUTANEOUS | 0 refills | Status: DC

## 2016-05-24 MED ORDER — FAMOTIDINE IN NACL 20-0.9 MG/50ML-% IV SOLN
20.0000 mg | Freq: Once | INTRAVENOUS | Status: AC
  Administered 2016-05-24: 20 mg via INTRAVENOUS
  Filled 2016-05-24: qty 50

## 2016-05-24 MED ORDER — CIPROFLOXACIN IN D5W 400 MG/200ML IV SOLN
400.0000 mg | Freq: Once | INTRAVENOUS | Status: AC
  Administered 2016-05-24: 400 mg via INTRAVENOUS
  Filled 2016-05-24: qty 200

## 2016-05-24 MED ORDER — SODIUM CHLORIDE 0.9 % IV SOLN
INTRAVENOUS | Status: DC
  Administered 2016-05-24: 10:00:00 via INTRAVENOUS

## 2016-05-24 MED ORDER — ONDANSETRON HCL 4 MG/2ML IJ SOLN
4.0000 mg | INTRAMUSCULAR | Status: DC | PRN
  Administered 2016-05-24: 4 mg via INTRAVENOUS
  Filled 2016-05-24: qty 2

## 2016-05-24 NOTE — Discharge Instructions (Signed)
Take the prescription as directed.  Call your regular medical doctor today to schedule a follow up appointment within the next 2 days.  Return to the Emergency Department immediately sooner if worsening.  ° °

## 2016-05-24 NOTE — ED Notes (Signed)
EDP made aware of critical I stat result

## 2016-05-24 NOTE — ED Triage Notes (Signed)
Patient BIB [facility] from home where patient lives with her daughter. Patient awoke around 0400 with sharp stomach pains that became progressively worse; tender on palpation.  Patient has a HX of stomach CA and has had 2/3 of her stomach removed. Patient reports increased urination, but only small amounts each time. Patient denies n/v/d. HX of dementia but A/O x 4 at this time, per EMS.

## 2016-05-24 NOTE — ED Notes (Signed)
Bed: [id number] Expected date:  Expected time:  Means of arrival:  Comments: 82 yr old abd pain

## 2016-05-24 NOTE — ED Provider Notes (Signed)
[facility] DEPT Provider Note   CSN: [id number] Arrival date & time: 05/24/16  [id number]  First Provider Contact:  [id number]      History   Chief Complaint Chief Complaint  Patient presents with  . Abdominal Pain    HPI ONEDA GOWARD is a 82 y.o. female.  The history is provided by the patient and a caregiver. The history is limited by the condition of the patient (Hx dementia).  Abdominal Pain    Pt was seen at 0945. Per EMS, pt and her daughter: Pt c/o abd pain that woke her up from sleep at 0400 this morning. Has been associated with nausea. Describes the pain as "sharp" and "all over."  Pt's daughter states pt "was fine last night," but pt states the pain has been present "for 3 weeks." Pt's daughter also states pt has had urinary frequency and questions if pt has a UTI. Pt was to be seen by her PMD today for this. Denies fevers, no vomiting/diarrhea, no CP/SOB, no back pain, no injury. Pt has significant hx of dementia and is bedridden at baseline.     Past Medical History:  Diagnosis Date  . ABDOMINAL PAIN, CHRONIC 04/07/2008  . ARTHRITIS 03/03/2008  . Arthritis of knee, degenerative 10/19/2011  . BRADYCARDIA, CHRONIC 12/17/2008  . CARDIAC MURMUR 02/01/2010  . CONSTIPATION, CHRONIC 10/15/2010  . DEGENERATIVE JOINT DISEASE, KNEES, BILATERAL 12/07/2007  . DEPRESSION 06/17/2009  . DIABETES MELLITUS, TYPE II 09/19/2007   DIET CONTROL   . DYSPHAGIA UNSPECIFIED 12/24/2009  . Gastroparesis 12/24/2009  . GENERALIZED OSTEOARTHROSIS INVOLVING HAND 10/03/2007  . GLAUCOMA 09/19/2007  . GOUT 09/19/2007  . GOUTY ARTHROPATHY UNSPECIFIED 02/17/2009  . HYPERLIPIDEMIA 01/27/2010  . HYPERTENSION 09/19/2007  . LUNG NODULE 03/03/2008  . MENOPAUSAL DISORDER 12/17/2008  . PERIPHERAL NEUROPATHY 06/19/2008  . PERSONAL HISTORY MALIGNANT NEOPLASM STOMACH 09/19/2007  . 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 STOMACH CANCER 03/03/2008  . UTI 12/15/2009    Patient Active Problem List   Diagnosis Date Noted  . Atherosclerosis of native arteries of the extremities with ulceration ([place]) 05/11/2016    Class: Chronic  . Weakness generalized   . Palliative care encounter   . Bacterial conjunctivitis 02/19/2016  . Fungal skin infection 02/19/2016  . Hypernatremia   . Goals of care, counseling/discussion   . Aspiration pneumonia ([place])   . AKI (acute kidney injury) ([place])   . Encounter for palliative care   . HCAP (healthcare-associated pneumonia) 01/21/2016  . Ulcer of left heel and midfoot with necrosis of muscle ([place]) 01/17/2016  . Type II diabetes mellitus with peripheral circulatory disorder ([place]) 01/01/2016  . Pressure ulcer 01/01/2016  . Peripheral autonomic neuropathy due to diabetes mellitus ([place]) 11/15/2015  . GERD without esophagitis 10/30/2015  . Alzheimer's dementia 07/28/2015  . Diabetes mellitus, type 2 ([place]) 07/28/2015  . Fracture of right femur ([place]) 07/28/2015  . Arthritis of knee 05/19/2015  . Anemia 02/25/2015  . Loss of weight 09/17/2014  . Chronic pain syndrome 06/16/2014  . Primary localized osteoarthrosis, lower leg 01/08/2014  . Rotator cuff tear arthropathy of both shoulders 12/09/2013  . CONSTIPATION, CHRONIC 10/15/2010  . Hyperlipidemia 01/27/2010  . Gastroparesis 12/24/2009  . Dysphagia 12/24/2009  . DEPRESSION 06/17/2009  . BRADYCARDIA, CHRONIC 12/17/2008  . PERIPHERAL NEUROPATHY 06/19/2008  . LUNG NODULE 03/03/2008  . GENERALIZED OSTEOARTHROSIS INVOLVING HAND 10/03/2007  . Gout 09/19/2007  . GLAUCOMA 09/19/2007  . Essential hypertension 09/19/2007    Past Surgical History:  Procedure Laterality Date  .  Billroth II gastorenterostomy    . COLONOSCOPY    . PARTIAL GASTRECTOMY    . VAGOTOMY         Home Medications    Prior to Admission medications   Medication Sig Start Date End Date Taking? Authorizing Provider  acetaminophen (TYLENOL) 500 MG tablet Take 1 tablet (500 mg total) by mouth every 6 (six) hours as  needed. Patient taking differently: Take 1,000 mg by mouth 2 (two) times daily.  05/11/16  Yes Shawn C Joy, PA-C  amLODipine (NORVASC) 5 MG tablet Take 1 tablet (5 mg total) by mouth daily. 02/26/14 02/09/17 Yes Biagio Borg, MD  colchicine 0.6 MG tablet Take 1 tablet (0.6 mg total) by mouth daily as needed (Gout pain.). Patient taking differently: Take 0.6 mg by mouth daily.  02/05/16  Yes Biagio Borg, MD  donepezil (ARICEPT) 10 MG tablet Take 1 tablet (10 mg total) by mouth at bedtime. Reported on 02/05/2016 02/05/16  Yes Biagio Borg, MD  febuxostat (ULORIC) 40 MG tablet Take 1 tablet (40 mg total) by mouth daily. Reported on 02/05/2016 02/05/16  Yes Biagio Borg, MD  lactose free nutrition (BOOST PLUS) LIQD Take 237 mLs by mouth 3 (three) times daily between meals. Patient taking differently: Take 237 mLs by mouth 2 (two) times daily as needed (between meals for appetite).  01/29/16  Yes Modena Jansky, MD  lactose free nutrition (BOOST) LIQD Take 237 mLs by mouth 2 (two) times daily as needed (between meals, for poor appetite).   Yes Historical Provider, MD  latanoprost (XALATAN) 0.005 % ophthalmic solution Place 1 drop into both eyes at bedtime. Reported on 02/05/2016 02/05/16  Yes Biagio Borg, MD  memantine (NAMENDA XR) 28 MG CP24 24 hr capsule Take 1 capsule (28 mg total) by mouth daily. Reported on 02/05/2016 02/05/16  Yes Biagio Borg, MD  Multiple Vitamins-Minerals (MULTIVITAMIN WITH MINERALS) tablet Take 1 tablet by mouth daily.   Yes Historical Provider, MD  omeprazole (PRILOSEC) 40 MG capsule Take 1 capsule (40 mg total) by mouth daily. 02/05/16  Yes Biagio Borg, MD  senna (SENOKOT) 8.6 MG tablet Take 1 tablet by mouth 2 (two) times daily as needed for constipation. Reported on 02/05/2016   Yes Historical Provider, MD  vitamin C (ASCORBIC ACID) 500 MG tablet Take 500 mg by mouth 2 (two) times daily. Reported on 05/11/2016   Yes Historical Provider, MD  Amino Acids-Protein Hydrolys (FEEDING SUPPLEMENT,  PRO-STAT SUGAR FREE 64,) LIQD Take 30 mLs by mouth 3 (three) times daily with meals. Patient not taking: Reported on 05/24/2016 11/15/15   Gerlene Fee, NP  erythromycin ophthalmic ointment Place 1 application into both eyes 3 (three) times daily. Patient not taking: Reported on 05/24/2016 02/19/16   Hoyt Koch, MD  feeding supplement, ENSURE ENLIVE, (ENSURE ENLIVE) LIQD Take 237 mLs by mouth 2 (two) times daily between meals. Patient not taking: Reported on 05/24/2016 01/29/16   Modena Jansky, MD  nystatin-triamcinolone ointment (MYCOLOG) APPLY TO AFFECTED AREA 2 TIMES A DAY. Patient not taking: Reported on 05/24/2016 05/19/16   Biagio Borg, MD    Family History Family History  Problem Relation Age of Onset  . Uterine cancer Mother   . Stroke Father     Hemorrhagic  . Cancer Sister     breast cancer and one sister with uterine cancer and one sister with ovarian cancerr  . Colon cancer Maternal Grandmother   . Colon cancer Paternal Grandfather   .  Diabetes Other   . Prostate cancer Brother   . Stomach cancer Neg Hx     Social History Social History  Substance Use Topics  . Smoking status: Former Smoker    Types: Cigarettes  . Smokeless tobacco: Never Used     Comment: use to smoke a pack of cigaretts a day for 25 yrs. but stopped 10 yrs. ago.   . Alcohol use No     Allergies   Penicillins; Voltaren [diclofenac sodium]; Dulcolax [bisacodyl]; Duloxetine hcl; and Timolol   Review of Systems Review of Systems  Unable to perform ROS: Dementia  Gastrointestinal: Positive for abdominal pain.     Physical Exam Updated Vital Signs BP 156/57 (BP Location: Left Arm)   Pulse 60   Temp 97.4 F (36.3 C)   Resp 17   SpO2 95%    Patient Vitals for the past 24 hrs:  BP Temp Temp src Pulse Resp SpO2  05/24/16 1357 (!) 159/48 97.8 F (36.6 C) Oral 66 21 97 %  05/24/16 1150 (!) 128/46 97.4 F (36.3 C) Oral (!) 54 16 96 %  05/24/16 0943 156/57 - Oral 60 17 95 %   05/24/16 0700 134/71 97.4 F (36.3 C) - 65 18 100 %  05/24/16 0659 - - - - - 100 %    Physical Exam 0950: Physical examination:  Nursing notes reviewed; Vital signs and O2 SAT reviewed;  Constitutional: Well developed, Well nourished, Well hydrated, In no acute distress; Head:  Normocephalic, atraumatic; Eyes: EOMI, PERRL, No scleral icterus; ENMT: Mouth and pharynx normal, Mucous membranes moist; Neck: Supple, Full range of motion, No lymphadenopathy; Cardiovascular: Regular rate and rhythm, No gallop; Respiratory: Breath sounds clear & equal bilaterally, No wheezes.  Speaking full sentences with ease, Normal respiratory effort/excursion; Chest: Nontender, Movement normal; Abdomen: Soft, +diffuse tenderness to palp. Nondistended, Normal bowel sounds; Genitourinary: No CVA tenderness; Extremities: No deformity, No tenderness, No edema, No calf edema or asymmetry.; Neuro: Awake, alert, acting per baseline (dementia) per family. No facial droop. Speech clear. Moves extremities spontaneously on stretcher..; Skin: Color normal, Warm, Dry.   ED Treatments / Results  Labs (all labs ordered are listed, but only abnormal results are displayed)   EKG  EKG Interpretation  Date/Time:  Tuesday May 24 2016 10:15:10 EDT Ventricular Rate:  53 PR Interval:    QRS Duration: 91 QT Interval:  472 QTC Calculation: 444 R Axis:   56 Text Interpretation:  Sinus rhythm Borderline T abnormalities, diffuse leads Artifact When compared with ECG of 01/21/2016 Nonspecific T wave abnormality is now Present Confirmed by [facility]  MD, Nunzio Cory [phone]) on 05/24/2016 10:27:06 AM       Radiology   Procedures Procedures (including critical care time)  Medications Ordered in ED Medications  0.9 %  sodium chloride infusion ( Intravenous New Bag/Given 05/24/16 1021)  famotidine (PEPCID) IVPB 20 mg premix (20 mg Intravenous New Bag/Given 05/24/16 1021)  ondansetron (ZOFRAN) injection 4 mg (4 mg Intravenous Given  05/24/16 1022)  morphine 2 MG/ML injection 2 mg (2 mg Intravenous Given 05/24/16 1022)  iopamidol (ISOVUE-300) 61 % injection 100 mL (100 mLs Intravenous Contrast Given 05/24/16 1048)     Initial Impression / Assessment and Plan / ED Course  I have reviewed the triage vital signs and the nursing notes.  Pertinent labs & imaging results that were available during my care of the patient were reviewed by me and considered in my medical decision making (see chart for details).  MDM Reviewed: previous  chart, nursing note and vitals Reviewed previous: labs and ECG Interpretation: labs, ECG, x-ray and CT scan    Results for orders placed or performed during the hospital encounter of 05/24/16  Lipase, blood  Result Value Ref Range   Lipase 11 11 - 51 U/L  Comprehensive metabolic panel  Result Value Ref Range   Sodium 140 135 - 145 mmol/L   Potassium 3.8 3.5 - 5.1 mmol/L   Chloride 111 101 - 111 mmol/L   CO2 24 22 - 32 mmol/L   Glucose, Bld 73 65 - 99 mg/dL   BUN 16 6 - 20 mg/dL   Creatinine, Ser 0.79 0.44 - 1.00 mg/dL   Calcium 9.0 8.9 - 10.3 mg/dL   Total Protein 6.6 6.5 - 8.1 g/dL   Albumin 3.1 (L) 3.5 - 5.0 g/dL   AST 24 15 - 41 U/L   ALT 12 (L) 14 - 54 U/L   Alkaline Phosphatase 108 38 - 126 U/L   Total Bilirubin 0.3 0.3 - 1.2 mg/dL   GFR calc non Af Amer >60 >60 mL/min   GFR calc Af Amer >60 >60 mL/min   Anion gap 5 5 - 15  CBC  Result Value Ref Range   WBC 6.7 4.0 - 10.5 K/uL   RBC 3.15 (L) 3.87 - 5.11 MIL/uL   Hemoglobin 10.5 (L) 12.0 - 15.0 g/dL   HCT 30.9 (L) 36.0 - 46.0 %   MCV 98.1 78.0 - 100.0 fL   MCH 33.3 26.0 - 34.0 pg   MCHC 34.0 30.0 - 36.0 g/dL   RDW 16.3 (H) 11.5 - 15.5 %   Platelets 230 150 - 400 K/uL  Urinalysis, Routine w reflex microscopic  Result Value Ref Range   Color, Urine YELLOW YELLOW   APPearance CLOUDY (A) CLEAR   Specific Gravity, Urine 1.022 1.005 - 1.030   pH 6.0 5.0 - 8.0   Glucose, UA NEGATIVE NEGATIVE mg/dL   Hgb urine dipstick  TRACE (A) NEGATIVE   Bilirubin Urine NEGATIVE NEGATIVE   Ketones, ur NEGATIVE NEGATIVE mg/dL   Protein, ur NEGATIVE NEGATIVE mg/dL   Nitrite NEGATIVE NEGATIVE   Leukocytes, UA LARGE (A) NEGATIVE  Urine microscopic-add on  Result Value Ref Range   Squamous Epithelial / LPF 0-5 (A) NONE SEEN   WBC, UA TOO NUMEROUS TO COUNT 0 - 5 WBC/hpf   RBC / HPF 0-5 0 - 5 RBC/hpf   Bacteria, UA FEW (A) NONE SEEN   Urine-Other YEAST PRESENT   I-Stat Troponin, ED (not at [facility])  Result Value Ref Range   Troponin i, poc 0.01 0.00 - 0.08 ng/mL   Comment 3          I-Stat CG4 Lactic Acid, ED  Result Value Ref Range   Lactic Acid, Venous 2.69 (HH) 0.5 - 1.9 mmol/L   Comment NOTIFIED PHYSICIAN   I-Stat CG4 Lactic Acid, ED  Result Value Ref Range   Lactic Acid, Venous 2.25 (HH) 0.5 - 1.9 mmol/L   Comment NOTIFIED PHYSICIAN    Dg Chest 1 View Result Date: 05/24/2016 CLINICAL DATA:  Left upper abdominal and lower chest pain beginning today. Pain with deep breath. EXAM: CHEST 1 VIEW COMPARISON:  Two-view chest x-ray 01/21/2016 FINDINGS: The heart size is normal. Dense atherosclerotic calcifications are present in the ascending thoracic aorta. Chronic interstitial coarsening is present. Previously seen edema has resolved. No focal airspace opacities are present. Minimal densities at the left base likely reflect atelectasis or scarring. Degenerate changes are again  noted in the shoulders bilaterally. IMPRESSION: 1. Minimal left basilar density likely reflects atelectasis or scarring, not significantly changed. 2. No acute cardiopulmonary disease. Electronically Signed   By: San Morelle M.D.   On: 05/24/2016 10:44   Ct Abdomen Pelvis W Contrast Result Date: 05/24/2016 CLINICAL DATA:  Sharp epigastric pain beginning this morning. History of stomach cancer and partial gastrectomy 2009. EXAM: CT ABDOMEN AND PELVIS WITH CONTRAST TECHNIQUE: Multidetector CT imaging of the abdomen and pelvis was performed using  the standard protocol following bolus administration of intravenous contrast. CONTRAST:  172mL ISOVUE-300 IOPAMIDOL (ISOVUE-300) INJECTION 61% COMPARISON:  12/25/2014 FINDINGS: Lower chest: Heart is borderline in size. Bibasilar atelectasis. No effusions. Hepatobiliary: No focal hepatic abnormality. Gallbladder unremarkable. Pancreas: Atrophic.  No focal abnormality or ductal dilatation. Spleen: No focal abnormality.  Normal size. Adrenals/Urinary Tract: Areas of cortical thinning within the upper poles of both kidneys. No hydronephrosis or focal mass. Adrenal glands and urinary bladder are unremarkable. Stomach/Bowel: Postoperative changes in the region the stomach from prior partial gastrectomy. No evidence of bowel obstruction. Moderate stool in the colon. Vascular/Lymphatic: Aortic and iliac atherosclerosis. No aneurysm. No adenopathy. Reproductive: Calcifications throughout the uterus compatible with calcified fibroids. No adnexal masses. Other: No free fluid or free air. Musculoskeletal: No acute bony abnormality or focal bone lesion. IMPRESSION: No acute findings in the abdomen or pelvis. Aortic atherosclerosis. Scarring in the upper poles of both kidneys.  No hydronephrosis. Moderate stool in the colon. Calcified uterine fibroids. Electronically Signed   By: Rolm Baptise M.D.   On: 05/24/2016 11:25    1310:  Judicious IVF given for mildly elevated lactic acid.  +UTI, UC pending; will dose IV cipro. EKG with NS TWA, but troponin normal and pt denies CP now or at any time recently.  T/C to [facility] [place] Poli, case discussed, including:  HPI, pertinent PM/SHx, VS/PE, dx testing, ED course and treatment:  No clear indication for admission at this time (WBC count normal, afebrile, VSS), requests to re-check lactic acid, if trending downwards, pt can be discharged with rx cipro, f/u PMD.   1450:  Lactic acid trending downward. Pt wants to go home now. Pt's daughter would like to take her home. Pt's daughter  requesting refill of "the topical yeast medicine she had." Dx and testing d/w pt and family.  Questions answered.  Verb understanding, agreeable to d/c home with outpt f/u.      Final Clinical Impressions(s) / ED Diagnoses   Final diagnoses:  None    New Prescriptions New Prescriptions   No medications on file     Francine Graven, DO 05/27/16 2119

## 2016-05-25 ENCOUNTER — Telehealth: Payer: Self-pay | Admitting: Emergency Medicine

## 2016-05-25 ENCOUNTER — Other Ambulatory Visit: Payer: Self-pay | Admitting: Internal Medicine

## 2016-05-25 LAB — URINE CULTURE: Culture: 40000 — AB

## 2016-05-25 MED ORDER — CLOTRIMAZOLE-BETAMETHASONE 1-0.05 % EX CREA: TOPICAL_CREAM | CUTANEOUS | 1 refills | Status: DC

## 2016-05-25 NOTE — Telephone Encounter (Signed)
Done erx   -  ok to change to lotrisone

## 2016-05-25 NOTE — Telephone Encounter (Signed)
PA was DENIED, please advise on alternative, thanks!

## 2016-05-25 NOTE — Telephone Encounter (Signed)
Pts insurance with not cover nystatin-triamcinolone. Can she get that switched to (MYCOLOG II) cream Lotrisone Cream. Pharmacy is [facility] Please follow up thanks.

## 2016-05-26 ENCOUNTER — Telehealth: Payer: Self-pay | Admitting: *Deleted

## 2016-05-26 MED ORDER — CLOTRIMAZOLE-BETAMETHASONE 1-0.05 % EX CREA: TOPICAL_CREAM | CUTANEOUS | 1 refills | Status: DC

## 2016-05-26 NOTE — Addendum Note (Signed)
Addended by: Della Goo C on: 05/26/2016 10:41 AM   Modules accepted: Orders

## 2016-05-26 NOTE — Progress Notes (Signed)
ED Antimicrobial Stewardship Positive Culture Follow Up   Shari Mcmahon is an 82 y.o. female who presented to [facility] on 05/24/2016 with a chief complaint of  Chief Complaint  Patient presents with  . Abdominal Pain    Recent Results (from the past 720 hour(s))  Urine culture     Status: Abnormal   Collection Time: 05/24/16  9:25 AM  Result Value Ref Range Status   Specimen Description URINE, CATHETERIZED  Final   Special Requests NONE  Final   Culture 40,000 COLONIES/mL YEAST (A)  Final   Report Status 05/25/2016 FINAL  Final    Treated with cipro for probable UTI. Low colony count of yeast grew in cx, deemed to be contaminant. Ok to finish course of cipro.  No further treatment necessary  ED Provider: Hyman Bible PA-C   Shari Mcmahon 05/26/2016, 9:23 AM Infectious Diseases Pharmacist Phone# [phone]

## 2016-05-26 NOTE — Telephone Encounter (Signed)
Medication refill sent to pharmacy  

## 2016-05-26 NOTE — Telephone Encounter (Signed)
Patient aware.

## 2016-05-26 NOTE — Telephone Encounter (Signed)
Post ED Visit - Positive Culture Follow-up  Culture report reviewed by antimicrobial stewardship pharmacist:  [x]  Elenor Quinones, Pharm.D. []  Heide Guile, Pharm.D., BCPS []  Parks Neptune, Pharm.D. []  Alycia Rossetti, Pharm.D., BCPS []  [place], [place].D., BCPS, AAHIVP []  Legrand Como, Pharm.D., BCPS, AAHIVP []  Milus Glazier, Pharm.D. []  Rob Crane, Pharm.D.  Positive urine culture Treated with Ciprofloxacin, organism sensitive to the same and no further patient follow-up is required at this time/Heather Emelda Brothers, PA-C  Ardeen Fillers 05/26/2016, 10:00 AM

## 2016-05-28 DIAGNOSIS — Z9181 History of falling: Secondary | ICD-10-CM | POA: Diagnosis not present

## 2016-05-28 DIAGNOSIS — L97423 Non-pressure chronic ulcer of left heel and midfoot with necrosis of muscle: Secondary | ICD-10-CM | POA: Diagnosis not present

## 2016-06-01 ENCOUNTER — Ambulatory Visit: Payer: Self-pay | Admitting: Vascular Surgery

## 2016-06-28 DIAGNOSIS — L97423 Non-pressure chronic ulcer of left heel and midfoot with necrosis of muscle: Secondary | ICD-10-CM | POA: Diagnosis not present

## 2016-06-28 DIAGNOSIS — Z9181 History of falling: Secondary | ICD-10-CM | POA: Diagnosis not present

## 2016-07-07 ENCOUNTER — Inpatient Hospital Stay (HOSPITAL_COMMUNITY)
Admission: EM | Admit: 2016-07-07 | Discharge: 2016-07-13 | DRG: 312 | Disposition: A | Discharge status: Home / Self Care | Payer: Medicare Other | Attending: Internal Medicine | Admitting: Internal Medicine

## 2016-07-07 ENCOUNTER — Encounter (HOSPITAL_COMMUNITY): Payer: Self-pay | Admitting: Radiology

## 2016-07-07 ENCOUNTER — Emergency Department (HOSPITAL_COMMUNITY): Payer: Medicare Other

## 2016-07-07 ENCOUNTER — Observation Stay (HOSPITAL_COMMUNITY)
Admit: 2016-07-07 | Discharge: 2016-07-07 | Disposition: A | Discharge status: Home / Self Care | Payer: Medicare Other | Attending: Internal Medicine | Admitting: Internal Medicine

## 2016-07-07 ENCOUNTER — Observation Stay (HOSPITAL_COMMUNITY): Payer: Medicare Other

## 2016-07-07 DIAGNOSIS — E785 Hyperlipidemia, unspecified: Secondary | ICD-10-CM | POA: Diagnosis present

## 2016-07-07 DIAGNOSIS — E119 Type 2 diabetes mellitus without complications: Secondary | ICD-10-CM

## 2016-07-07 DIAGNOSIS — K5900 Constipation, unspecified: Secondary | ICD-10-CM | POA: Diagnosis present

## 2016-07-07 DIAGNOSIS — R1319 Other dysphagia: Secondary | ICD-10-CM | POA: Diagnosis present

## 2016-07-07 DIAGNOSIS — Z87891 Personal history of nicotine dependence: Secondary | ICD-10-CM

## 2016-07-07 DIAGNOSIS — Z87440 Personal history of urinary (tract) infections: Secondary | ICD-10-CM

## 2016-07-07 DIAGNOSIS — E860 Dehydration: Secondary | ICD-10-CM | POA: Diagnosis present

## 2016-07-07 DIAGNOSIS — L8990 Pressure ulcer of unspecified site, unspecified stage: Secondary | ICD-10-CM | POA: Diagnosis present

## 2016-07-07 DIAGNOSIS — M6281 Muscle weakness (generalized): Secondary | ICD-10-CM | POA: Diagnosis not present

## 2016-07-07 DIAGNOSIS — R569 Unspecified convulsions: Secondary | ICD-10-CM

## 2016-07-07 DIAGNOSIS — R2990 Unspecified symptoms and signs involving the nervous system: Secondary | ICD-10-CM

## 2016-07-07 DIAGNOSIS — E1151 Type 2 diabetes mellitus with diabetic peripheral angiopathy without gangrene: Secondary | ICD-10-CM | POA: Diagnosis present

## 2016-07-07 DIAGNOSIS — G9389 Other specified disorders of brain: Secondary | ICD-10-CM | POA: Diagnosis not present

## 2016-07-07 DIAGNOSIS — F0280 Dementia in other diseases classified elsewhere without behavioral disturbance: Secondary | ICD-10-CM | POA: Diagnosis present

## 2016-07-07 DIAGNOSIS — E1143 Type 2 diabetes mellitus with diabetic autonomic (poly)neuropathy: Secondary | ICD-10-CM | POA: Diagnosis not present

## 2016-07-07 DIAGNOSIS — L97429 Non-pressure chronic ulcer of left heel and midfoot with unspecified severity: Secondary | ICD-10-CM | POA: Diagnosis present

## 2016-07-07 DIAGNOSIS — M79605 Pain in left leg: Secondary | ICD-10-CM | POA: Diagnosis not present

## 2016-07-07 DIAGNOSIS — R55 Syncope and collapse: Secondary | ICD-10-CM

## 2016-07-07 DIAGNOSIS — G309 Alzheimer's disease, unspecified: Secondary | ICD-10-CM | POA: Diagnosis present

## 2016-07-07 DIAGNOSIS — Z880 Allergy status to penicillin: Secondary | ICD-10-CM

## 2016-07-07 DIAGNOSIS — E1100 Type 2 diabetes mellitus with hyperosmolarity without nonketotic hyperglycemic-hyperosmolar coma (NKHHC): Secondary | ICD-10-CM

## 2016-07-07 DIAGNOSIS — I10 Essential (primary) hypertension: Secondary | ICD-10-CM | POA: Diagnosis present

## 2016-07-07 DIAGNOSIS — G894 Chronic pain syndrome: Secondary | ICD-10-CM | POA: Diagnosis present

## 2016-07-07 DIAGNOSIS — R42 Dizziness and giddiness: Secondary | ICD-10-CM | POA: Diagnosis not present

## 2016-07-07 DIAGNOSIS — N390 Urinary tract infection, site not specified: Secondary | ICD-10-CM | POA: Diagnosis present

## 2016-07-07 DIAGNOSIS — R251 Tremor, unspecified: Secondary | ICD-10-CM | POA: Diagnosis not present

## 2016-07-07 DIAGNOSIS — Z85028 Personal history of other malignant neoplasm of stomach: Secondary | ICD-10-CM | POA: Diagnosis not present

## 2016-07-07 DIAGNOSIS — D72829 Elevated white blood cell count, unspecified: Secondary | ICD-10-CM | POA: Diagnosis not present

## 2016-07-07 DIAGNOSIS — K222 Esophageal obstruction: Secondary | ICD-10-CM | POA: Diagnosis present

## 2016-07-07 DIAGNOSIS — H409 Unspecified glaucoma: Secondary | ICD-10-CM | POA: Diagnosis not present

## 2016-07-07 DIAGNOSIS — J690 Pneumonitis due to inhalation of food and vomit: Secondary | ICD-10-CM | POA: Diagnosis not present

## 2016-07-07 DIAGNOSIS — Z888 Allergy status to other drugs, medicaments and biological substances status: Secondary | ICD-10-CM

## 2016-07-07 DIAGNOSIS — R29810 Facial weakness: Secondary | ICD-10-CM | POA: Diagnosis not present

## 2016-07-07 DIAGNOSIS — M79604 Pain in right leg: Secondary | ICD-10-CM | POA: Diagnosis present

## 2016-07-07 DIAGNOSIS — K5909 Other constipation: Secondary | ICD-10-CM | POA: Diagnosis present

## 2016-07-07 DIAGNOSIS — Z79899 Other long term (current) drug therapy: Secondary | ICD-10-CM

## 2016-07-07 DIAGNOSIS — R0609 Other forms of dyspnea: Secondary | ICD-10-CM | POA: Diagnosis not present

## 2016-07-07 DIAGNOSIS — R1310 Dysphagia, unspecified: Secondary | ICD-10-CM

## 2016-07-07 DIAGNOSIS — Z66 Do not resuscitate: Secondary | ICD-10-CM | POA: Diagnosis present

## 2016-07-07 DIAGNOSIS — Z934 Other artificial openings of gastrointestinal tract status: Secondary | ICD-10-CM

## 2016-07-07 DIAGNOSIS — B9620 Unspecified Escherichia coli [E. coli] as the cause of diseases classified elsewhere: Secondary | ICD-10-CM | POA: Diagnosis not present

## 2016-07-07 DIAGNOSIS — M109 Gout, unspecified: Secondary | ICD-10-CM | POA: Diagnosis present

## 2016-07-07 DIAGNOSIS — R32 Unspecified urinary incontinence: Secondary | ICD-10-CM | POA: Diagnosis present

## 2016-07-07 DIAGNOSIS — I6789 Other cerebrovascular disease: Secondary | ICD-10-CM | POA: Diagnosis not present

## 2016-07-07 HISTORY — DX: Adverse effect of unspecified anesthetic, initial encounter: T41.45XA

## 2016-07-07 HISTORY — DX: Unspecified osteoarthritis, unspecified site: M19.90

## 2016-07-07 HISTORY — DX: Gastro-esophageal reflux disease without esophagitis: K21.9

## 2016-07-07 HISTORY — DX: Thoracic aortic aneurysm, without rupture: I71.2

## 2016-07-07 HISTORY — DX: Other complications of anesthesia, initial encounter: T88.59XA

## 2016-07-07 HISTORY — DX: Inflammatory liver disease, unspecified: K75.9

## 2016-07-07 HISTORY — DX: Acute embolism and thrombosis of unspecified deep veins of unspecified lower extremity: I82.409

## 2016-07-07 HISTORY — DX: Pneumonitis due to inhalation of food and vomit: J69.0

## 2016-07-07 HISTORY — DX: Syncope and collapse: R55

## 2016-07-07 HISTORY — DX: Thoracic aortic aneurysm, without rupture, unspecified: I71.20

## 2016-07-07 LAB — DIFFERENTIAL
BASOS PCT: 0 %
Basophils Absolute: 0 10*3/uL (ref 0.0–0.1)
EOS PCT: 1 %
Eosinophils Absolute: 0.1 10*3/uL (ref 0.0–0.7)
Lymphocytes Relative: 24 %
Lymphs Abs: 1.7 10*3/uL (ref 0.7–4.0)
MONO ABS: 0.2 10*3/uL (ref 0.1–1.0)
Monocytes Relative: 3 %
Neutro Abs: 5.1 10*3/uL (ref 1.7–7.7)
Neutrophils Relative %: 72 %

## 2016-07-07 LAB — I-STAT TROPONIN, ED: TROPONIN I, POC: 0.03 ng/mL (ref 0.00–0.08)

## 2016-07-07 LAB — COMPREHENSIVE METABOLIC PANEL
ALT: 11 U/L — ABNORMAL LOW (ref 14–54)
ANION GAP: 9 (ref 5–15)
AST: 27 U/L (ref 15–41)
Albumin: 3.1 g/dL — ABNORMAL LOW (ref 3.5–5.0)
Alkaline Phosphatase: 99 U/L (ref 38–126)
BUN: 9 mg/dL (ref 6–20)
CALCIUM: 9.4 mg/dL (ref 8.9–10.3)
CHLORIDE: 110 mmol/L (ref 101–111)
CO2: 23 mmol/L (ref 22–32)
Creatinine, Ser: 0.89 mg/dL (ref 0.44–1.00)
GFR calc non Af Amer: 57 mL/min — ABNORMAL LOW (ref >60)
Glucose, Bld: 101 mg/dL — ABNORMAL HIGH (ref 65–99)
Potassium: 3.5 mmol/L (ref 3.5–5.1)
SODIUM: 142 mmol/L (ref 135–145)
Total Bilirubin: 0.6 mg/dL (ref 0.3–1.2)
Total Protein: 6.5 g/dL (ref 6.5–8.1)

## 2016-07-07 LAB — HEPATIC FUNCTION PANEL
ALK PHOS: 93 U/L (ref 38–126)
ALT: 11 U/L — ABNORMAL LOW (ref 14–54)
AST: 22 U/L (ref 15–41)
Albumin: 3 g/dL — ABNORMAL LOW (ref 3.5–5.0)
BILIRUBIN INDIRECT: 0.3 mg/dL (ref 0.3–0.9)
Bilirubin, Direct: 0.1 mg/dL (ref 0.1–0.5)
TOTAL PROTEIN: 6.3 g/dL — AB (ref 6.5–8.1)
Total Bilirubin: 0.4 mg/dL (ref 0.3–1.2)

## 2016-07-07 LAB — PROTIME-INR
INR: 1.07
PROTHROMBIN TIME: 13.9 s (ref 11.4–15.2)

## 2016-07-07 LAB — CBC
HCT: 36.7 % (ref 36.0–46.0)
Hemoglobin: 12.2 g/dL (ref 12.0–15.0)
MCH: 33.2 pg (ref 26.0–34.0)
MCHC: 33.2 g/dL (ref 30.0–36.0)
MCV: 100 fL (ref 78.0–100.0)
PLATELETS: 148 10*3/uL — AB (ref 150–400)
RBC: 3.67 MIL/uL — ABNORMAL LOW (ref 3.87–5.11)
RDW: 14.5 % (ref 11.5–15.5)
WBC: 7.1 10*3/uL (ref 4.0–10.5)

## 2016-07-07 LAB — ETHANOL

## 2016-07-07 LAB — I-STAT CHEM 8, ED
BUN: 10 mg/dL (ref 6–20)
CALCIUM ION: 1.14 mmol/L — AB (ref 1.15–1.40)
CHLORIDE: 106 mmol/L (ref 101–111)
Creatinine, Ser: 0.8 mg/dL (ref 0.44–1.00)
Glucose, Bld: 97 mg/dL (ref 65–99)
HCT: 41 % (ref 36.0–46.0)
Hemoglobin: 13.9 g/dL (ref 12.0–15.0)
Potassium: 3.5 mmol/L (ref 3.5–5.1)
SODIUM: 143 mmol/L (ref 135–145)
TCO2: 24 mmol/L (ref 0–100)

## 2016-07-07 LAB — APTT: aPTT: 34 seconds (ref 24–36)

## 2016-07-07 LAB — TROPONIN I
TROPONIN I: 0.03 ng/mL — AB (ref <0.03)
TROPONIN I: 0.06 ng/mL — AB (ref <0.03)

## 2016-07-07 LAB — TSH: TSH: 1.27 u[IU]/mL (ref 0.350–4.500)

## 2016-07-07 LAB — ECHOCARDIOGRAM COMPLETE

## 2016-07-07 LAB — MAGNESIUM: MAGNESIUM: 1.6 mg/dL — AB (ref 1.7–2.4)

## 2016-07-07 MED ORDER — ASPIRIN EC 81 MG PO TBEC
81.0000 mg | DELAYED_RELEASE_TABLET | Freq: Every day | ORAL | Status: DC
  Administered 2016-07-08 – 2016-07-13 (×6): 81 mg via ORAL
  Filled 2016-07-07 (×7): qty 1

## 2016-07-07 MED ORDER — VITAMIN C 500 MG PO TABS
500.0000 mg | ORAL_TABLET | Freq: Two times a day (BID) | ORAL | Status: DC
  Administered 2016-07-07 – 2016-07-13 (×12): 500 mg via ORAL
  Filled 2016-07-07 (×12): qty 1

## 2016-07-07 MED ORDER — POLYETHYLENE GLYCOL 3350 17 G PO PACK: 17.0000 g | PACK | Freq: Every day | ORAL | Status: DC | PRN

## 2016-07-07 MED ORDER — DONEPEZIL HCL 10 MG PO TABS
10.0000 mg | ORAL_TABLET | Freq: Every day | ORAL | Status: DC
  Administered 2016-07-07 – 2016-07-12 (×6): 10 mg via ORAL
  Filled 2016-07-07 (×6): qty 1

## 2016-07-07 MED ORDER — ACETAMINOPHEN 325 MG PO TABS
650.0000 mg | ORAL_TABLET | Freq: Four times a day (QID) | ORAL | Status: DC | PRN
Start: 2016-07-07 — End: 2016-07-13
  Administered 2016-07-07 – 2016-07-11 (×2): 650 mg via ORAL
  Filled 2016-07-07 (×3): qty 2

## 2016-07-07 MED ORDER — ONDANSETRON HCL 4 MG/2ML IJ SOLN: 4.0000 mg | Freq: Four times a day (QID) | INTRAMUSCULAR | Status: DC | PRN

## 2016-07-07 MED ORDER — MAGNESIUM SULFATE IN D5W 1-5 GM/100ML-% IV SOLN
1.0000 g | Freq: Once | INTRAVENOUS | Status: AC
Start: 2016-07-07 — End: 2016-07-07
  Administered 2016-07-07: 1 g via INTRAVENOUS
  Filled 2016-07-07: qty 100

## 2016-07-07 MED ORDER — AMLODIPINE BESYLATE 5 MG PO TABS
5.0000 mg | ORAL_TABLET | Freq: Every day | ORAL | Status: DC
  Administered 2016-07-07 – 2016-07-08 (×2): 5 mg via ORAL
  Filled 2016-07-07 (×2): qty 1

## 2016-07-07 MED ORDER — DOCUSATE SODIUM 100 MG PO CAPS
100.0000 mg | ORAL_CAPSULE | Freq: Two times a day (BID) | ORAL | Status: DC
  Administered 2016-07-07 – 2016-07-13 (×12): 100 mg via ORAL
  Filled 2016-07-07 (×13): qty 1

## 2016-07-07 MED ORDER — COLCHICINE 0.6 MG PO TABS
0.6000 mg | ORAL_TABLET | Freq: Every day | ORAL | Status: DC
  Administered 2016-07-08 – 2016-07-13 (×6): 0.6 mg via ORAL
  Filled 2016-07-07 (×7): qty 1

## 2016-07-07 MED ORDER — PANTOPRAZOLE SODIUM 40 MG PO TBEC
40.0000 mg | DELAYED_RELEASE_TABLET | Freq: Every day | ORAL | Status: DC
  Administered 2016-07-08 – 2016-07-13 (×6): 40 mg via ORAL
  Filled 2016-07-07 (×7): qty 1

## 2016-07-07 MED ORDER — SODIUM CHLORIDE 0.9% FLUSH
3.0000 mL | Freq: Two times a day (BID) | INTRAVENOUS | Status: DC
  Administered 2016-07-07 – 2016-07-13 (×7): 3 mL via INTRAVENOUS

## 2016-07-07 MED ORDER — FEBUXOSTAT 40 MG PO TABS
40.0000 mg | ORAL_TABLET | Freq: Every day | ORAL | Status: DC
  Administered 2016-07-08 – 2016-07-13 (×5): 40 mg via ORAL
  Filled 2016-07-07 (×7): qty 1

## 2016-07-07 MED ORDER — POTASSIUM CHLORIDE IN NACL 20-0.9 MEQ/L-% IV SOLN
INTRAVENOUS | Status: DC
  Administered 2016-07-07: 19:00:00 via INTRAVENOUS
  Filled 2016-07-07: qty 1000

## 2016-07-07 MED ORDER — ONDANSETRON HCL 4 MG PO TABS: 4.0000 mg | ORAL_TABLET | Freq: Four times a day (QID) | ORAL | Status: DC | PRN

## 2016-07-07 MED ORDER — MEMANTINE HCL ER 28 MG PO CP24
28.0000 mg | ORAL_CAPSULE | Freq: Every day | ORAL | Status: DC
  Administered 2016-07-08 – 2016-07-13 (×5): 28 mg via ORAL
  Filled 2016-07-07 (×7): qty 1

## 2016-07-07 MED ORDER — ENOXAPARIN SODIUM 30 MG/0.3ML ~~LOC~~ SOLN
30.0000 mg | SUBCUTANEOUS | Status: DC
  Administered 2016-07-07 – 2016-07-10 (×4): 30 mg via SUBCUTANEOUS
  Filled 2016-07-07 (×4): qty 0.3

## 2016-07-07 MED ORDER — ACETAMINOPHEN 650 MG RE SUPP
650.0000 mg | Freq: Four times a day (QID) | RECTAL | Status: DC | PRN
Start: 2016-07-07 — End: 2016-07-13

## 2016-07-07 MED ORDER — SODIUM CHLORIDE 0.9 % IV SOLN: INTRAVENOUS | Status: DC

## 2016-07-07 MED ORDER — LEVALBUTEROL HCL 0.63 MG/3ML IN NEBU: 0.6300 mg | INHALATION_SOLUTION | Freq: Four times a day (QID) | RESPIRATORY_TRACT | Status: DC | PRN

## 2016-07-07 MED ORDER — SENNOSIDES 8.6 MG PO TABS
1.0000 | ORAL_TABLET | Freq: Two times a day (BID) | ORAL | Status: DC | PRN
  Filled 2016-07-07: qty 1

## 2016-07-07 NOTE — H&P (Addendum)
[facility] History and Physical  Shari Mcmahon [id number] DOB: September 14, 1927 DOA: 07/07/2016  Referring physician:   PCP: Cathlean Cower, MD   Chief Complaint: Syncope HPI:  82 y.o. female with PMH of dementia, severe protein calorie malnutrition, dysphasia, recurrent UTIs, type 2 diabetes and peripheral vascular disease (not a candidate for revascularization) with osteomyelitis of the left calcaneus treated with IV antibiotics via a PICC line (which was apparently placed on or about 01/04/16 according to progress notes, and received 14 days of IV cefepime), who was brought in by EMS for syncope , history obtained from chart as patient is nonverbal. She was apparently eating breakfast, complained of b/l Knee  Pain,  eyes rolled   back  and had some generalized mild shaking , after which  she was confused not following commands and minimally speaking. As she was on the way to the hospital, as per EMS she was returning to baseline, speaking and following commands and by her arrival she was back at baseline..  Patient was unconscious apparently for several minutes, found to have left-sided facial droop, right-sided weakness. Upon arrival to the ER the patient was able to follow commands. Neurology was consulted for possible CVA. Initial CT scan was negative. Vital signs within normal limits. Admission labs showed a lactic acid of 2.69. Point-of-care troponin negative. White count within normal limits. UA pending       Review of Systems: negative for the following  Constitutional: Denies fever, chills, diaphoresis, appetite change and fatigue.  HEENT: Denies photophobia, eye pain, redness, hearing loss, ear pain, congestion, sore throat, rhinorrhea, sneezing, mouth sores, trouble swallowing, neck pain, neck stiffness and tinnitus.  Respiratory: Denies SOB, DOE, cough, chest tightness, and wheezing.  Cardiovascular: Denies chest pain, palpitations and leg swelling.  Gastrointestinal: Denies  nausea, vomiting, abdominal pain, diarrhea, constipation, blood in stool and abdominal distention.  Genitourinary: Denies dysuria, urgency, frequency, hematuria, flank pain and difficulty urinating.  Musculoskeletal: Denies myalgias, back pain, joint swelling, arthralgias and gait problem.  Skin: Denies pallor, rash and wound.  Neurological: Positive for dizziness and weakness. Hematological: Denies adenopathy. Easy bruising, personal or family bleeding history  Psychiatric/Behavioral: Denies suicidal ideation, mood changes, confusion, nervousness, sleep disturbance and agitation       Past Medical History:  Diagnosis Date  . ABDOMINAL PAIN, CHRONIC 04/07/2008  . ARTHRITIS 03/03/2008  . Arthritis of knee, degenerative 10/19/2011  . BRADYCARDIA, CHRONIC 12/17/2008  . CARDIAC MURMUR 02/01/2010  . CONSTIPATION, CHRONIC 10/15/2010  . DEGENERATIVE JOINT DISEASE, KNEES, BILATERAL 12/07/2007  . DEPRESSION 06/17/2009  . DIABETES MELLITUS, TYPE II 09/19/2007   DIET CONTROL   . DYSPHAGIA UNSPECIFIED 12/24/2009  . Gastroparesis 12/24/2009  . GENERALIZED OSTEOARTHROSIS INVOLVING HAND 10/03/2007  . GLAUCOMA 09/19/2007  . GOUT 09/19/2007  . GOUTY ARTHROPATHY UNSPECIFIED 02/17/2009  . HYPERLIPIDEMIA 01/27/2010  . HYPERTENSION 09/19/2007  . LUNG NODULE 03/03/2008  . MENOPAUSAL DISORDER 12/17/2008  . PERIPHERAL NEUROPATHY 06/19/2008  . PERSONAL HISTORY MALIGNANT NEOPLASM STOMACH 09/19/2007  . 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 STOMACH CANCER 03/03/2008  . UTI 12/15/2009     Past Surgical History:  Procedure Laterality Date  . [place] II gastorenterostomy    . COLONOSCOPY    . PARTIAL GASTRECTOMY    . VAGOTOMY        Social History:  reports that she has quit smoking. Her smoking use included Cigarettes. She has never used smokeless tobacco. She reports that she does not drink alcohol or use drugs.  Allergies  Allergen Reactions  . Penicillins Shortness Of Breath and  Swelling    Has patient had a PCN reaction causing immediate rash, facial/tongue/throat swelling, SOB or lightheadedness with hypotension: yes Has patient had a PCN reaction causing severe rash involving mucus membranes or skin necrosis: no Has patient had a PCN reaction that required hospitalization: unknown Has patient had a PCN reaction occurring within the last 10 years: no If all of the above answers are "NO", then may proceed with Cephalosporin use.   . Voltaren [Diclofenac Sodium] Other (See Comments)    Stomach irritation  . Dulcolax [Bisacodyl]     Unknown   . Duloxetine Hcl Other (See Comments)    Made patient not want to eat or drink  . Timolol     REACTION: bradycardia worse    Family History  Problem Relation Age of Onset  . Uterine cancer Mother   . Stroke Father     Hemorrhagic  . Cancer Sister     breast cancer and one sister with uterine cancer and one sister with ovarian cancerr  . Colon cancer Maternal Grandmother   . Colon cancer Paternal Grandfather   . Diabetes Other   . Prostate cancer Brother   . Stomach cancer Neg Hx          Prior to Admission medications   Medication Sig Start Date End Date Taking? Authorizing Provider  acetaminophen (TYLENOL) 500 MG tablet Take 1 tablet (500 mg total) by mouth every 6 (six) hours as needed. Patient taking differently: Take 1,000 mg by mouth 2 (two) times daily.  05/11/16  Yes Shawn C Joy, PA-C  amLODipine (NORVASC) 5 MG tablet Take 1 tablet (5 mg total) by mouth daily. 02/26/14 02/09/17 Yes Biagio Borg, MD  colchicine 0.6 MG tablet Take 1 tablet (0.6 mg total) by mouth daily as needed (Gout pain.). Patient taking differently: Take 0.6 mg by mouth daily.  02/05/16  Yes Biagio Borg, MD  donepezil (ARICEPT) 10 MG tablet Take 1 tablet (10 mg total) by mouth at bedtime. Reported on 02/05/2016 02/05/16  Yes Biagio Borg, MD  febuxostat (ULORIC) 40 MG tablet Take 1 tablet (40 mg total) by mouth daily. Reported on 02/05/2016  02/05/16  Yes Biagio Borg, MD  lactose free nutrition (BOOST PLUS) LIQD Take 237 mLs by mouth 3 (three) times daily between meals. Patient taking differently: Take 237 mLs by mouth 2 (two) times daily as needed (between meals for appetite).  01/29/16  Yes Modena Jansky, MD  latanoprost (XALATAN) 0.005 % ophthalmic solution PLACE 1 DROP INTO BOTH EYES AT BEDTIME. 05/25/16  Yes Biagio Borg, MD  memantine (NAMENDA XR) 28 MG CP24 24 hr capsule Take 1 capsule (28 mg total) by mouth daily. Reported on 02/05/2016 02/05/16  Yes Biagio Borg, MD  Multiple Vitamins-Minerals (MULTIVITAMIN WITH MINERALS) tablet Take 1 tablet by mouth daily.   Yes Historical Provider, MD  omeprazole (PRILOSEC) 40 MG capsule Take 1 capsule (40 mg total) by mouth daily. 02/05/16  Yes Biagio Borg, MD  senna (SENOKOT) 8.6 MG tablet Take 1 tablet by mouth 2 (two) times daily as needed for constipation. Reported on 02/05/2016   Yes Historical Provider, MD  vitamin C (ASCORBIC ACID) 500 MG tablet Take 500 mg by mouth 2 (two) times daily. Reported on 05/11/2016   Yes Historical Provider, MD  Amino Acids-Protein Hydrolys (FEEDING SUPPLEMENT, PRO-STAT SUGAR FREE 64,) LIQD Take 30 mLs by mouth 3 (three) times daily  with meals. Patient not taking: Reported on 05/24/2016 11/15/15   Gerlene Fee, NP  feeding supplement, ENSURE ENLIVE, (ENSURE ENLIVE) LIQD Take 237 mLs by mouth 2 (two) times daily between meals. Patient not taking: Reported on 05/24/2016 01/29/16   Modena Jansky, MD     Physical Exam: Vitals:   07/07/16 1215 07/07/16 1230 07/07/16 1245 07/07/16 1300  BP: 133/57 134/55 (!) 135/53 (!) 131/48  Pulse: 63 60 (!) 59 (!) 56  Resp: 16 18 11 17   Temp:      TempSrc:      SpO2: 100% 100% 100% 100%      Constitutional: NAD, calm, comfortable Vitals:   07/07/16 1215 07/07/16 1230 07/07/16 1245 07/07/16 1300  BP: 133/57 134/55 (!) 135/53 (!) 131/48  Pulse: 63 60 (!) 59 (!) 56  Resp: 16 18 11 17   Temp:      TempSrc:      SpO2:  100% 100% 100% 100%   Eyes: PERRL, lids and conjunctivae normal ENMT: Mucous membranes are moist. Posterior pharynx clear of any exudate or lesions.Normal dentition.  Neck: normal, supple, no masses, no thyromegaly Respiratory: clear to auscultation bilaterally, no wheezing, no crackles. Normal respiratory effort. No accessory muscle use.  Cardiovascular: Regular rate and rhythm, no murmurs / rubs / gallops. No extremity edema. 2+ pedal pulses. No carotid bruits.  Abdomen: no tenderness, no masses palpated. No hepatosplenomegaly. Bowel sounds positive.  Musculoskeletal: no clubbing / cyanosis. No joint deformity upper and lower extremities. Good ROM, no contractures. Normal muscle tone.  Skin: no rashes, lesions, ulcers. No induration Neurologic: CN 2-12 grossly intact. Sensation intact, DTR normal. Strength 5/5 in all 4. EOMs fully intact, eyes tracking normally, no nystagmus, no noted field cuts  Neurological: She is alert and oriented to person, place, and time. She displays no tremor. She displays no seizure activity.  AAOx3, answering questions and following commands appropriately; weak throughout but equal strength UE and LE bilaterally; CN grossly intact; no ataxia noted; no focal neuro deficits or facial asymmetry appreciated; no tremors or seizure activity  Psychiatric: Normal judgment and insight. Alert and oriented x 3. Normal mood.     Labs on Admission: I have personally reviewed following labs and imaging studies  CBC:  Recent Labs Lab 07/07/16 1027 07/07/16 1034  WBC 7.1  --   NEUTROABS 5.1  --   HGB 12.2 13.9  HCT 36.7 41.0  MCV 100.0  --   PLT 148*  --     Basic Metabolic Panel:  Recent Labs Lab 07/07/16 1027 07/07/16 1034  NA 142 143  K 3.5 3.5  CL 110 106  CO2 23  --   GLUCOSE 101* 97  BUN 9 10  CREATININE 0.89 0.80  CALCIUM 9.4  --     GFR: CrCl cannot be calculated (Unknown ideal weight.).  Liver Function Tests:  Recent Labs Lab  07/07/16 1027  AST 27  ALT 11*  ALKPHOS 99  BILITOT 0.6  PROT 6.5  ALBUMIN 3.1*   No results for input(s): LIPASE, AMYLASE in the last 168 hours. No results for input(s): AMMONIA in the last 168 hours.  Coagulation Profile:  Recent Labs Lab 07/07/16 1027  INR 1.07   No results for input(s): DDIMER in the last 72 hours.  Cardiac Enzymes: No results for input(s): CKTOTAL, CKMB, CKMBINDEX, TROPONINI in the last 168 hours.  BNP (last 3 results) No results for input(s): PROBNP in the last 8760 hours.  HbA1C: No results for input(s):  HGBA1C in the last 72 hours. Lab Results  Component Value Date   HGBA1C 6.0 06/10/2014   HGBA1C 5.9 11/19/2013   HGBA1C 5.9 11/14/2012     CBG: No results for input(s): GLUCAP in the last 168 hours.  Lipid Profile: No results for input(s): CHOL, HDL, LDLCALC, TRIG, CHOLHDL, LDLDIRECT in the last 72 hours.  Thyroid Function Tests: No results for input(s): TSH, T4TOTAL, FREET4, T3FREE, THYROIDAB in the last 72 hours.  Anemia Panel: No results for input(s): VITAMINB12, FOLATE, FERRITIN, TIBC, IRON, RETICCTPCT in the last 72 hours.  Urine analysis:    Component Value Date/Time   COLORURINE YELLOW 05/24/2016 0925   APPEARANCEUR CLOUDY (A) 05/24/2016 0925   LABSPEC 1.022 05/24/2016 0925   PHURINE 6.0 05/24/2016 0925   GLUCOSEU NEGATIVE 05/24/2016 0925   GLUCOSEU NEGATIVE 11/19/2013 0952   HGBUR TRACE (A) 05/24/2016 0925   BILIRUBINUR NEGATIVE 05/24/2016 0925   BILIRUBINUR negative 08/13/2014 [place] 05/24/2016 0925   PROTEINUR NEGATIVE 05/24/2016 0925   UROBILINOGEN 1.0 06/16/2015 1748   NITRITE NEGATIVE 05/24/2016 0925   LEUKOCYTESUR LARGE (A) 05/24/2016 0925    Sepsis Labs: @LABRCNTIP (procalcitonin:4,lacticidven:4) )No results found for this or any previous visit (from the past 240 hour(s)).       Radiological Exams on Admission: Ct Head Wo Contrast  Result Date: 07/07/2016 CLINICAL DATA:  Altered  mental status with left facial droop EXAM: CT HEAD WITHOUT CONTRAST TECHNIQUE: Contiguous axial images were obtained from the base of the skull through the vertex without intravenous contrast. COMPARISON:  01/21/2016 FINDINGS: Brain: Diffuse atrophic changes are identified. Scattered areas of decreased attenuation are noted consistent with chronic white matter ischemic change. These are stable from the prior exam. Vascular: No hyperdense vessel or unexpected calcification. Skull: Normal. Negative for fracture or focal lesion. Sinuses/Orbits: No acute finding. IMPRESSION: Chronic atrophic and ischemic changes.  No acute abnormality noted. These results were called by telephone at the time of interpretation on 07/07/2016 at 10:39 am to Dr. Cristobal Goldmann, who verbally acknowledged these results. Electronically Signed   By: Inez Catalina M.D.   On: 07/07/2016 10:40   Ct Head Wo Contrast  Result Date: 07/07/2016 CLINICAL DATA:  Altered mental status with left facial droop EXAM: CT HEAD WITHOUT CONTRAST TECHNIQUE: Contiguous axial images were obtained from the base of the skull through the vertex without intravenous contrast. COMPARISON:  01/21/2016 FINDINGS: Brain: Diffuse atrophic changes are identified. Scattered areas of decreased attenuation are noted consistent with chronic white matter ischemic change. These are stable from the prior exam. Vascular: No hyperdense vessel or unexpected calcification. Skull: Normal. Negative for fracture or focal lesion. Sinuses/Orbits: No acute finding. IMPRESSION: Chronic atrophic and ischemic changes.  No acute abnormality noted. These results were called by telephone at the time of interpretation on 07/07/2016 at 10:39 am to Dr. Cristobal Goldmann, who verbally acknowledged these results. Electronically Signed   By: Inez Catalina M.D.   On: 07/07/2016 10:40      EKG: Independently reviewed.    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, contract precautions    Essential hypertension-continue Norvasc, also started on gentle hydration, as clinically appears dehydrated  CONSTIPATION, CHRONIC-continue MiraLAX as needed    Chronic pain syndrome-complaining of bilateral leg pain, history of peripheral vascular disease    Alzheimer's dementia-continue Namenda and Aricept    Diabetes mellitus, type 2 (HCC)-check hemoglobin A1c, started on sliding scale insulin  History of pressure ulcer/ Ulcer of left heel and midfoot with necrosis of muscle (HCC)/-wound care consultation obtained,Not a candidate for revascularization  Gout-continue colchicine and Uloric -, check uric acid level   Peripheral autonomic neuropathy due to diabetes mellitus ([place]) - Unfortunately, has not tolerated Neurontin due to excessive lethargy.  DVT prophylaxis:  LOVENOX      Code Status Orders dnr         Start     Ordered     Family Communication: discussed with patient' . By the bedside   Disposition Plan:  Anticipate discharge in 2-3 days pending further workup and clinical progress  Consults called: NONE   Admission status:  Observation  Total time spent 55 minutes.Greater than 50% of this time was spent in counseling, explanation of diagnosis, planning of further management, and coordination of care  [facility] MD [facility] Pager [phone]  If 7PM-7AM, please contact night-coverage [email or web address] Password [email or web address]  07/07/2016, 1:33 PM

## 2016-07-07 NOTE — Progress Notes (Signed)
Trop .03, called from lab at 1545. Paged abrol at 1555. Return call at 1600, no new order at this time. Pt stable.

## 2016-07-07 NOTE — Care Management Note (Signed)
Case Management Note  Patient Details  Name: Shari Mcmahon MRN: [id number] Date of Birth: 27-Dec-1926  Subjective/Objective:  Pt in with alzheimers. She is from home.                   Action/Plan: Awaiting PT/OT recommendations. CM following for discharge disposition.   Expected Discharge Date:                  Expected Discharge Plan:     In-House Referral:     Discharge planning Services     Post Acute Care Choice:    Choice offered to:     DME Arranged:    DME Agency:     HH Arranged:    HH Agency:     Status of Service:  In process, will continue to follow  If discussed at Long Length of Stay Meetings, dates discussed:    Additional Comments:  Pollie Friar, RN 07/07/2016, 3:03 PM

## 2016-07-07 NOTE — ED Notes (Signed)
In&out cath pt.no urine return

## 2016-07-07 NOTE — Code Documentation (Signed)
Stroke team arrived to patient. Patient with suspected syncopal episode while ambulating at home with family. EMS reported left facial droop and AMS. Upon assessment in [facility] ED, patient had NIH of 5. Dysarthria was scored d/t pt having no dentures in, unaware if this is pt's baseline. Patient reported pain in LLE which could of limited movement upon exam. Code Stroke canceled

## 2016-07-07 NOTE — Care Management Obs Status (Signed)
[place] NOTIFICATION   Patient Details  Name: Shari Mcmahon MRN: [id number] Date of Birth: 22-Feb-1927   Medicare Observation Status Notification Given:  Yes    Pollie Friar, RN 07/07/2016, 4:03 PM

## 2016-07-07 NOTE — Consult Note (Signed)
NEURO HOSPITALIST CONSULT NOTE      Reason for Consult: cancelled code stoke.   Syncope vs seizure   History obtained from:  EMS  HPI:                                                                                                                                          Shari Mcmahon is an 82 y.o. female with hx as below presented after daughter witnessed her mom have LOC as they were eating breakfast, eyes rolled in the back of her head and had some generalized mild shaking afterwards when she came to after one minute she was confused not following commands and minimally speaking. She had lose of urine control as well. As she was on the way to the hospital, as per EMS she was returning to baseline, speaking and following commands and by her arrival she was back at baseline.  Past Medical History:  Diagnosis Date  . ABDOMINAL PAIN, CHRONIC 04/07/2008  . ARTHRITIS 03/03/2008  . Arthritis of knee, degenerative 10/19/2011  . BRADYCARDIA, CHRONIC 12/17/2008  . CARDIAC MURMUR 02/01/2010  . CONSTIPATION, CHRONIC 10/15/2010  . DEGENERATIVE JOINT DISEASE, KNEES, BILATERAL 12/07/2007  . DEPRESSION 06/17/2009  . DIABETES MELLITUS, TYPE II 09/19/2007   DIET CONTROL   . DYSPHAGIA UNSPECIFIED 12/24/2009  . Gastroparesis 12/24/2009  . GENERALIZED OSTEOARTHROSIS INVOLVING HAND 10/03/2007  . GLAUCOMA 09/19/2007  . GOUT 09/19/2007  . GOUTY ARTHROPATHY UNSPECIFIED 02/17/2009  . HYPERLIPIDEMIA 01/27/2010  . HYPERTENSION 09/19/2007  . LUNG NODULE 03/03/2008  . MENOPAUSAL DISORDER 12/17/2008  . PERIPHERAL NEUROPATHY 06/19/2008  . PERSONAL HISTORY MALIGNANT NEOPLASM STOMACH 09/19/2007  . 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 STOMACH CANCER 03/03/2008  . UTI 12/15/2009    Past Surgical History:  Procedure Laterality Date  . Billroth II gastorenterostomy    . COLONOSCOPY    . PARTIAL GASTRECTOMY    . VAGOTOMY      Family History  Problem Relation Age of Onset   . Uterine cancer Mother   . Stroke Father     Hemorrhagic  . Cancer Sister     breast cancer and one sister with uterine cancer and one sister with ovarian cancerr  . Colon cancer Maternal Grandmother   . Colon cancer Paternal Grandfather   . Diabetes Other   . Prostate cancer Brother   . Stomach cancer Neg Hx       Social History:  reports that she has quit smoking. Her smoking use included Cigarettes. She has never used smokeless tobacco. She reports that she does not drink alcohol or use drugs.  Allergies  Allergen Reactions  . Penicillins Shortness Of Breath and Swelling    Has patient had a PCN reaction causing immediate rash, facial/tongue/throat swelling,  SOB or lightheadedness with hypotension: yes Has patient had a PCN reaction causing severe rash involving mucus membranes or skin necrosis: no Has patient had a PCN reaction that required hospitalization: unknown Has patient had a PCN reaction occurring within the last 10 years: no If all of the above answers are "NO", then may proceed with Cephalosporin use.   . Voltaren [Diclofenac Sodium] Other (See Comments)    Stomach irritation  . Dulcolax [Bisacodyl]     Unknown   . Duloxetine Hcl Other (See Comments)    Made patient not want to eat or drink  . Timolol     REACTION: bradycardia worse    MEDICATIONS:                                                                                                                     I have reviewed the patient's current medications.   ROS:                                                                                                                                       History obtained from chart review  General ROS: negative for - chills, fatigue, fever, night sweats, weight gain or weight loss Psychological ROS: negative for - behavioral disorder, hallucinations, memory difficulties, mood swings or suicidal ideation Ophthalmic ROS: negative for - blurry vision, double  vision, eye pain or loss of vision ENT ROS: negative for - epistaxis, nasal discharge, oral lesions, sore throat, tinnitus or vertigo Allergy and Immunology ROS: negative for - hives or itchy/watery eyes Hematological and Lymphatic ROS: negative for - bleeding problems, bruising or swollen lymph nodes Endocrine ROS: negative for - galactorrhea, hair pattern changes, polydipsia/polyuria or temperature intolerance Respiratory ROS: negative for - cough, hemoptysis, shortness of breath or wheezing Cardiovascular ROS: negative for - chest pain, dyspnea on exertion, edema or irregular heartbeat Gastrointestinal ROS: negative for - abdominal pain, diarrhea, hematemesis, nausea/vomiting or stool incontinence Genito-Urinary ROS: negative for - dysuria, hematuria, incontinence or urinary frequency/urgency Musculoskeletal ROS: negative for - joint swelling or muscular weakness Neurological ROS: as noted in HPI Dermatological ROS: negative for rash and skin lesion changes   There were no vitals taken for this visit.   Neurologic Examination:  HEENT-  Normocephalic, no lesions, without obvious abnormality.  Normal external eye and conjunctiva.  Normal TM's bilaterally.  Normal auditory canals and external ears. Normal external nose, mucus membranes and septum.  Normal pharynx. Cardiovascular- regular rate and rhythm, S1, S2 normal, no murmur, click, rub or gallop, pulses palpable throughout   Lungs- chest clear, no wheezing, rales, normal symmetric air entry, Heart exam - S1, S2 normal, no murmur, no gallop, rate regular Abdomen- soft, non-tender; bowel sounds normal; no masses,  no organomegaly   Neurological Examination Mental Status: Alert, oriented x 2  Speech fluent.  Able to follow 2 step commands  Cranial Nerves: II: Visual fields grossly normal, pupils equal, round, reactive to light  III,IV,  VI: ptosis not present, extra-ocular motions intact bilaterally V,VII: smile symmetric, facial light touch sensation normal bilaterally VIII: hearing normal bilaterally  Motor: Right : Upper extremity   4/5    Left:     Upper extremity   4/5  Lower extremity   3/5     Lower extremity   3/5 Tone and bulk:normal tone throughout; no atrophy noted Sensory: Pinprick and light touch intact throughout, bilaterally     Lab Results: Basic Metabolic Panel:  Recent Labs Lab 07/07/16 1034  NA 143  K 3.5  CL 106  GLUCOSE 97  BUN 10  CREATININE 0.80    Liver Function Tests: No results for input(s): AST, ALT, ALKPHOS, BILITOT, PROT, ALBUMIN in the last 168 hours. No results for input(s): LIPASE, AMYLASE in the last 168 hours. No results for input(s): AMMONIA in the last 168 hours.  CBC:  Recent Labs Lab 07/07/16 1027 07/07/16 1034  WBC 7.1  --   NEUTROABS 5.1  --   HGB 12.2 13.9  HCT 36.7 41.0  MCV 100.0  --   PLT 148*  --     Cardiac Enzymes: No results for input(s): CKTOTAL, CKMB, CKMBINDEX, TROPONINI in the last 168 hours.  Lipid Panel: No results for input(s): CHOL, TRIG, HDL, CHOLHDL, VLDL, LDLCALC in the last 168 hours.  CBG: No results for input(s): GLUCAP in the last 168 hours.  Microbiology: Results for orders placed or performed during the hospital encounter of 05/24/16  Urine culture     Status: Abnormal   Collection Time: 05/24/16  9:25 AM  Result Value Ref Range Status   Specimen Description URINE, CATHETERIZED  Final   Special Requests NONE  Final   Culture 40,000 COLONIES/mL YEAST (A)  Final   Report Status 05/25/2016 FINAL  Final    Coagulation Studies: No results for input(s): LABPROT, INR in the last 72 hours.  Imaging: Ct Head Wo Contrast  Result Date: 07/07/2016 CLINICAL DATA:  Altered mental status with left facial droop EXAM: CT HEAD WITHOUT CONTRAST TECHNIQUE: Contiguous axial images were obtained from the base of the skull through the  vertex without intravenous contrast. COMPARISON:  01/21/2016 FINDINGS: Brain: Diffuse atrophic changes are identified. Scattered areas of decreased attenuation are noted consistent with chronic white matter ischemic change. These are stable from the prior exam. Vascular: No hyperdense vessel or unexpected calcification. Skull: Normal. Negative for fracture or focal lesion. Sinuses/Orbits: No acute finding. IMPRESSION: Chronic atrophic and ischemic changes.  No acute abnormality noted. These results were called by telephone at the time of interpretation on 07/07/2016 at 10:39 am to Dr. Cristobal Goldmann, who verbally acknowledged these results. Electronically Signed   By: Inez Catalina M.D.   On: 07/07/2016 10:40        Assessment/Plan:  82 y.o. female with hx as below presented after daughter witnessed her mom have LOC as they were eating breakfast, eyes rolled in the back of her head and had some generalized mild shaking afterwards when she came to after one minute she was confused not following commands and minimally speaking. She had lose of urine control as well. As she was on the way to the hospital, as per EMS she was returning to baseline, speaking and following commands and by her arrival she was back at baseline.   Syncope vs Seizure  CTH negative  - EEG - MRI brain with and without contrast - Infectious workup, patient known to have recurrent UTI's, may be a seizure in the setting of UTI lowering seizure threshold in a patient with advanced dementia - EKG/Echo as per syncope workup

## 2016-07-07 NOTE — Progress Notes (Signed)
  Echocardiogram 2D Echocardiogram has been performed.  Shari Mcmahon 07/07/2016, 3:29 PM

## 2016-07-07 NOTE — ED Triage Notes (Signed)
Pt ambulating with dtr when pt stated her knees hurt and needed to sit down. Pt proceed to become unconscious. EMS states when they arrived pt was still unconscious and leaning to Right side with L sided facial droop. Code stroke called 1010. LSN 0935. Upon arrival to ED pt alert, following commands. Neurology canceled code stroke 1023. BP 94/4, HR 88,

## 2016-07-07 NOTE — Progress Notes (Signed)
Routine EEG completed, results pending. 

## 2016-07-07 NOTE — ED Provider Notes (Signed)
[facility] DEPT Provider Note   CSN: [id number] Arrival date & time: 07/07/16  1022     History   Chief Complaint Chief Complaint  Patient presents with  . Stroke Symptoms    HPI Shari Mcmahon is a 82 y.o. female.  The history is provided by the patient and medical records.   82 year old female with history of arthritis, diabetes, degenerative joint disease, glaucoma, gout, hyperlipidemia, hypertension, neuropathy, history of gastric ulcers, presenting to the ED with stroke-like symptoms. History provided by patient and daughter at bedside.  Patient reports this morning around 0935 her daughter was helping her try walking to the bathroom when she stated her legs felt weak and she needed to sit down.  Daughter states she was tried to get her to the toilet when she began having generalized shaking in her extremities and her eyes rolled back into her head. She states this went on for a few seconds and she called EMS.  States afterwards patient was able to voice that she was "ok" but was still not back to baseline.  There was apparently an episode of urinary incontinence as well. On arrival to the ED patient is awake, alert, oriented to her baseline and following commands. Neurology evaluated and cancelled code stroke. Patient reports she has having some pain in her genital region. She was recently treated for UTI and has finished her course of antibiotics. She denies any fever or chills.  Patient does not have hx of seizures in the past.  Past Medical History:  Diagnosis Date  . ABDOMINAL PAIN, CHRONIC 04/07/2008  . ARTHRITIS 03/03/2008  . Arthritis of knee, degenerative 10/19/2011  . BRADYCARDIA, CHRONIC 12/17/2008  . CARDIAC MURMUR 02/01/2010  . CONSTIPATION, CHRONIC 10/15/2010  . DEGENERATIVE JOINT DISEASE, KNEES, BILATERAL 12/07/2007  . DEPRESSION 06/17/2009  . DIABETES MELLITUS, TYPE II 09/19/2007   DIET CONTROL   . DYSPHAGIA UNSPECIFIED 12/24/2009  . Gastroparesis 12/24/2009  .  GENERALIZED OSTEOARTHROSIS INVOLVING HAND 10/03/2007  . GLAUCOMA 09/19/2007  . GOUT 09/19/2007  . GOUTY ARTHROPATHY UNSPECIFIED 02/17/2009  . HYPERLIPIDEMIA 01/27/2010  . HYPERTENSION 09/19/2007  . LUNG NODULE 03/03/2008  . MENOPAUSAL DISORDER 12/17/2008  . PERIPHERAL NEUROPATHY 06/19/2008  . PERSONAL HISTORY MALIGNANT NEOPLASM STOMACH 09/19/2007  . 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 STOMACH CANCER 03/03/2008  . UTI 12/15/2009    Patient Active Problem List   Diagnosis Date Noted  . Atherosclerosis of native arteries of the extremities with ulceration ([place]) 05/11/2016    Class: Chronic  . Weakness generalized   . Palliative care encounter   . Bacterial conjunctivitis 02/19/2016  . Fungal skin infection 02/19/2016  . Hypernatremia   . Goals of care, counseling/discussion   . Aspiration pneumonia ([place])   . AKI (acute kidney injury) ([place])   . Encounter for palliative care   . HCAP (healthcare-associated pneumonia) 01/21/2016  . Ulcer of left heel and midfoot with necrosis of muscle ([place]) 01/17/2016  . Type II diabetes mellitus with peripheral circulatory disorder ([place]) 01/01/2016  . Pressure ulcer 01/01/2016  . Peripheral autonomic neuropathy due to diabetes mellitus ([place]) 11/15/2015  . GERD without esophagitis 10/30/2015  . Alzheimer's dementia 07/28/2015  . Diabetes mellitus, type 2 ([place]) 07/28/2015  . Fracture of right femur ([place]) 07/28/2015  . Arthritis of knee 05/19/2015  . Anemia 02/25/2015  . Loss of weight 09/17/2014  . Chronic pain syndrome 06/16/2014  . Primary localized osteoarthrosis, lower leg 01/08/2014  . Rotator cuff tear arthropathy of both shoulders  12/09/2013  . CONSTIPATION, CHRONIC 10/15/2010  . Hyperlipidemia 01/27/2010  . Gastroparesis 12/24/2009  . Dysphagia 12/24/2009  . DEPRESSION 06/17/2009  . BRADYCARDIA, CHRONIC 12/17/2008  . PERIPHERAL NEUROPATHY 06/19/2008  . LUNG NODULE 03/03/2008  . GENERALIZED OSTEOARTHROSIS INVOLVING  HAND 10/03/2007  . Gout 09/19/2007  . GLAUCOMA 09/19/2007  . Essential hypertension 09/19/2007    Past Surgical History:  Procedure Laterality Date  . Billroth II gastorenterostomy    . COLONOSCOPY    . PARTIAL GASTRECTOMY    . VAGOTOMY      OB History    No data available       Home Medications    Prior to Admission medications   Medication Sig Start Date End Date Taking? Authorizing Provider  acetaminophen (TYLENOL) 500 MG tablet Take 1 tablet (500 mg total) by mouth every 6 (six) hours as needed. Patient taking differently: Take 1,000 mg by mouth 2 (two) times daily.  05/11/16   Shawn C Joy, PA-C  Amino Acids-Protein Hydrolys (FEEDING SUPPLEMENT, PRO-STAT SUGAR FREE 64,) LIQD Take 30 mLs by mouth 3 (three) times daily with meals. Patient not taking: Reported on 05/24/2016 11/15/15   Gerlene Fee, NP  amLODipine (NORVASC) 5 MG tablet Take 1 tablet (5 mg total) by mouth daily. 02/26/14 02/09/17  Biagio Borg, MD  ciprofloxacin (CIPRO) 500 MG tablet Take 1 tablet (500 mg total) by mouth 2 (two) times daily. 05/24/16   Francine Graven, DO  clotrimazole-betamethasone (LOTRISONE) cream Use as directed twice per day as needed 05/26/16   Biagio Borg, MD  colchicine 0.6 MG tablet Take 1 tablet (0.6 mg total) by mouth daily as needed (Gout pain.). Patient taking differently: Take 0.6 mg by mouth daily.  02/05/16   Biagio Borg, MD  donepezil (ARICEPT) 10 MG tablet Take 1 tablet (10 mg total) by mouth at bedtime. Reported on 02/05/2016 02/05/16   Biagio Borg, MD  erythromycin ophthalmic ointment Place 1 application into both eyes 3 (three) times daily. Patient not taking: Reported on 05/24/2016 02/19/16   Hoyt Koch, MD  febuxostat (ULORIC) 40 MG tablet Take 1 tablet (40 mg total) by mouth daily. Reported on 02/05/2016 02/05/16   Biagio Borg, MD  feeding supplement, ENSURE ENLIVE, (ENSURE ENLIVE) LIQD Take 237 mLs by mouth 2 (two) times daily between meals. Patient not taking: Reported on  05/24/2016 01/29/16   Modena Jansky, MD  lactose free nutrition (BOOST PLUS) LIQD Take 237 mLs by mouth 3 (three) times daily between meals. Patient taking differently: Take 237 mLs by mouth 2 (two) times daily as needed (between meals for appetite).  01/29/16   Modena Jansky, MD  lactose free nutrition (BOOST) LIQD Take 237 mLs by mouth 2 (two) times daily as needed (between meals, for poor appetite).    Historical Provider, MD  latanoprost (XALATAN) 0.005 % ophthalmic solution PLACE 1 DROP INTO BOTH EYES AT BEDTIME. 05/25/16   Biagio Borg, MD  memantine (NAMENDA XR) 28 MG CP24 24 hr capsule Take 1 capsule (28 mg total) by mouth daily. Reported on 02/05/2016 02/05/16   Biagio Borg, MD  Multiple Vitamins-Minerals (MULTIVITAMIN WITH MINERALS) tablet Take 1 tablet by mouth daily.    Historical Provider, MD  nystatin-triamcinolone [facility] II) cream Apply to affected area twice a day 05/24/16   Francine Graven, DO  nystatin-triamcinolone ointment (MYCOLOG) APPLY TO AFFECTED AREA 2 TIMES A DAY. Patient not taking: Reported on 05/24/2016 05/19/16   Biagio Borg, MD  omeprazole (PRILOSEC) 40 MG capsule Take 1 capsule (40 mg total) by mouth daily. 02/05/16   Biagio Borg, MD  senna (SENOKOT) 8.6 MG tablet Take 1 tablet by mouth 2 (two) times daily as needed for constipation. Reported on 02/05/2016    Historical Provider, MD  vitamin C (ASCORBIC ACID) 500 MG tablet Take 500 mg by mouth 2 (two) times daily. Reported on 05/11/2016    Historical Provider, MD    Family History Family History  Problem Relation Age of Onset  . Uterine cancer Mother   . Stroke Father     Hemorrhagic  . Cancer Sister     breast cancer and one sister with uterine cancer and one sister with ovarian cancerr  . Colon cancer Maternal Grandmother   . Colon cancer Paternal Grandfather   . Diabetes Other   . Prostate cancer Brother   . Stomach cancer Neg Hx     Social History Social History  Substance Use Topics  . Smoking status:  Former Smoker    Types: Cigarettes  . Smokeless tobacco: Never Used     Comment: use to smoke a pack of cigaretts a day for 25 yrs. but stopped 10 yrs. ago.   . Alcohol use No     Allergies   Penicillins; Voltaren [diclofenac sodium]; Dulcolax [bisacodyl]; Duloxetine hcl; and Timolol   Review of Systems Review of Systems  Neurological: Positive for dizziness and weakness.  All other systems reviewed and are negative.    Physical Exam Updated Vital Signs BP 127/85 (BP Location: Right Arm)   Temp 98.6 F (37 C) (Oral)   Resp 25   SpO2 94%   Physical Exam  Constitutional: She is oriented to person, place, and time. She appears well-developed and well-nourished.  Elderly, appears generally weak  HENT:  Head: Normocephalic and atraumatic.  Mouth/Throat: Oropharynx is clear and moist.  Eyes: Conjunctivae and EOM are normal. Pupils are equal, round, and reactive to light.  EOMs fully intact, eyes tracking normally, no nystagmus, no noted field cuts  Neck: Normal range of motion.  Cardiovascular: Normal rate, regular rhythm and normal heart sounds.   Pulmonary/Chest: Effort normal and breath sounds normal.  Abdominal: Soft. Bowel sounds are normal.  Musculoskeletal: Normal range of motion.  Neurological: She is alert and oriented to person, place, and time. She displays no tremor. She displays no seizure activity.  AAOx3, answering questions and following commands appropriately; weak throughout but equal strength UE and LE bilaterally; CN grossly intact; no ataxia noted; no focal neuro deficits or facial asymmetry appreciated; no tremors or seizure activity  Skin: Skin is warm and dry.  Psychiatric: She has a normal mood and affect.  Nursing note and vitals reviewed.    ED Treatments / Results  Labs (all labs ordered are listed, but only abnormal results are displayed) Labs Reviewed  CBC - Abnormal; Notable for the following:       Result Value   RBC 3.67 (*)     Platelets 148 (*)    All other components within normal limits  COMPREHENSIVE METABOLIC PANEL - Abnormal; Notable for the following:    Glucose, Bld 101 (*)    Albumin 3.1 (*)    ALT 11 (*)    GFR calc non Af Amer 57 (*)    All other components within normal limits  I-STAT CHEM 8, ED - Abnormal; Notable for the following:    Calcium, Ion 1.14 (*)    All other components within normal  limits  ETHANOL  PROTIME-INR  APTT  DIFFERENTIAL  URINE RAPID DRUG SCREEN, HOSP PERFORMED  URINALYSIS, ROUTINE W REFLEX MICROSCOPIC (NOT AT [facility])  I-STAT TROPOININ, ED    EKG  EKG Interpretation None       Radiology Ct Head Wo Contrast  Result Date: 07/07/2016 CLINICAL DATA:  Altered mental status with left facial droop EXAM: CT HEAD WITHOUT CONTRAST TECHNIQUE: Contiguous axial images were obtained from the base of the skull through the vertex without intravenous contrast. COMPARISON:  01/21/2016 FINDINGS: Brain: Diffuse atrophic changes are identified. Scattered areas of decreased attenuation are noted consistent with chronic white matter ischemic change. These are stable from the prior exam. Vascular: No hyperdense vessel or unexpected calcification. Skull: Normal. Negative for fracture or focal lesion. Sinuses/Orbits: No acute finding. IMPRESSION: Chronic atrophic and ischemic changes.  No acute abnormality noted. These results were called by telephone at the time of interpretation on 07/07/2016 at 10:39 am to Dr. Cristobal Goldmann, who verbally acknowledged these results. Electronically Signed   By: Inez Catalina M.D.   On: 07/07/2016 10:40    Procedures Procedures (including critical care time)  Medications Ordered in ED Medications - No data to display   Initial Impression / Assessment and Plan / ED Course  I have reviewed the triage vital signs and the nursing notes.  Pertinent labs & imaging results that were available during my care of the patient were reviewed by me and considered in my medical  decision making (see chart for details).  Clinical Course  Value Comment By Time  CT Head Wo Contrast (Reviewed) Larene Pickett, PA-C 09/53 1433   82 year old female presenting to the ED as a code stroke. This was canceled on her initial assessment by neurology. Patient with possible seizure this morning as witnessed by her daughter. She has no history of same. No focal deficits noted on my exam-- she does appear mildly weak diffusely.  Her workup in ED including labs and CT head is overall reassuring. UA pending, patient does have a history of recurrent UTI in the past.  Neurology recommends admission for MRI w/ and w/out contrast as well as EEG.  Discussed with hospitalist, Dr. Allyson Sabal-- will evaluate in ED and admit.  Patient and family updated-- in agreement with plan of care.  Final Clinical Impressions(s) / ED Diagnoses   Final diagnoses:  Stroke-like symptoms  Seizure-like activity [facility])    New Prescriptions New Prescriptions   No medications on file     Larene Pickett, Hershal Coria 07/07/16 1347    [place] Ferguson, MD 07/07/16 1535

## 2016-07-07 NOTE — ED Notes (Addendum)
Pt passed swallow screen, was given Tylenol for pain, awaiting for WOCN to evaluate sacral wound.

## 2016-07-07 NOTE — Procedures (Signed)
EEG Report  Clinical History:  Syncope with convulsive features.  Technical Summary: A 19 channel digital EEG recording was performed using the 10-20 international system of electrode placement.  Bipolar and Referential montages were used.  The total recording time was about 20 minutes.  Description:  There is a posterior dominant rhythm of 8 Hz. Both hemispheres are symmetrical and there is no focal slowing present.  There are epileptiform discharges or   electrographic seizures present.  Patient remained awake and sleep was not recorded.    Impression:  This is a normal EEG in the awake.  If seizures remain a concern then a sleep deprived EEG and/or more prolonged EEG may be of additional value.     Rogue Jury, MS, MD

## 2016-07-08 ENCOUNTER — Observation Stay (HOSPITAL_COMMUNITY): Payer: Medicare Other

## 2016-07-08 DIAGNOSIS — R55 Syncope and collapse: Secondary | ICD-10-CM | POA: Diagnosis not present

## 2016-07-08 DIAGNOSIS — R569 Unspecified convulsions: Secondary | ICD-10-CM | POA: Diagnosis not present

## 2016-07-08 DIAGNOSIS — J690 Pneumonitis due to inhalation of food and vomit: Secondary | ICD-10-CM | POA: Diagnosis not present

## 2016-07-08 DIAGNOSIS — D72829 Elevated white blood cell count, unspecified: Secondary | ICD-10-CM | POA: Diagnosis not present

## 2016-07-08 LAB — CBC
HCT: 30.4 % — ABNORMAL LOW (ref 36.0–46.0)
HEMATOCRIT: 29.9 % — AB (ref 36.0–46.0)
HEMOGLOBIN: 10.1 g/dL — AB (ref 12.0–15.0)
Hemoglobin: 9.8 g/dL — ABNORMAL LOW (ref 12.0–15.0)
MCH: 32.9 pg (ref 26.0–34.0)
MCH: 32.8 pg (ref 26.0–34.0)
MCHC: 32.8 g/dL (ref 30.0–36.0)
MCHC: 33.2 g/dL (ref 30.0–36.0)
MCV: 100.3 fL — AB (ref 78.0–100.0)
MCV: 98.7 fL (ref 78.0–100.0)
PLATELETS: 113 10*3/uL — AB (ref 150–400)
Platelets: 126 10*3/uL — ABNORMAL LOW (ref 150–400)
RBC: 2.98 MIL/uL — ABNORMAL LOW (ref 3.87–5.11)
RBC: 3.08 MIL/uL — AB (ref 3.87–5.11)
RDW: 14.6 % (ref 11.5–15.5)
RDW: 14.6 % (ref 11.5–15.5)
WBC: 20.3 10*3/uL — AB (ref 4.0–10.5)
WBC: 19.7 10*3/uL — ABNORMAL HIGH (ref 4.0–10.5)

## 2016-07-08 LAB — VITAMIN B12: Vitamin B-12: 1232 pg/mL — ABNORMAL HIGH (ref 180–914)

## 2016-07-08 LAB — COMPREHENSIVE METABOLIC PANEL
ALT: 9 U/L — ABNORMAL LOW (ref 14–54)
ANION GAP: 7 (ref 5–15)
AST: 19 U/L (ref 15–41)
Albumin: 2.3 g/dL — ABNORMAL LOW (ref 3.5–5.0)
Alkaline Phosphatase: 73 U/L (ref 38–126)
BILIRUBIN TOTAL: 0.5 mg/dL (ref 0.3–1.2)
BUN: 12 mg/dL (ref 6–20)
CHLORIDE: 109 mmol/L (ref 101–111)
CO2: 23 mmol/L (ref 22–32)
Calcium: 8.5 mg/dL — ABNORMAL LOW (ref 8.9–10.3)
Creatinine, Ser: 0.9 mg/dL (ref 0.44–1.00)
GFR, EST NON AFRICAN AMERICAN: 56 mL/min — AB (ref >60)
Glucose, Bld: 109 mg/dL — ABNORMAL HIGH (ref 65–99)
POTASSIUM: 3.4 mmol/L — AB (ref 3.5–5.1)
Sodium: 139 mmol/L (ref 135–145)
Total Protein: 4.9 g/dL — ABNORMAL LOW (ref 6.5–8.1)

## 2016-07-08 LAB — URINE MICROSCOPIC-ADD ON

## 2016-07-08 LAB — URINALYSIS, ROUTINE W REFLEX MICROSCOPIC
BILIRUBIN URINE: NEGATIVE
Glucose, UA: NEGATIVE mg/dL
Ketones, ur: NEGATIVE mg/dL
Nitrite: NEGATIVE
PH: 5.5 (ref 5.0–8.0)
Protein, ur: NEGATIVE mg/dL
SPECIFIC GRAVITY, URINE: 1.021 (ref 1.005–1.030)

## 2016-07-08 LAB — HEMOGLOBIN A1C
Hgb A1c MFr Bld: 4.7 % — ABNORMAL LOW (ref 4.8–5.6)
Mean Plasma Glucose: 88 mg/dL

## 2016-07-08 LAB — TROPONIN I: TROPONIN I: 0.06 ng/mL — AB (ref <0.03)

## 2016-07-08 MED ORDER — PREGABALIN 75 MG PO CAPS
75.0000 mg | ORAL_CAPSULE | Freq: Two times a day (BID) | ORAL | Status: DC
  Administered 2016-07-08 – 2016-07-13 (×10): 75 mg via ORAL
  Filled 2016-07-08 (×10): qty 1

## 2016-07-08 MED ORDER — METRONIDAZOLE IN NACL 5-0.79 MG/ML-% IV SOLN
500.0000 mg | Freq: Three times a day (TID) | INTRAVENOUS | Status: DC
Start: 2016-07-08 — End: 2016-07-11
  Administered 2016-07-08 – 2016-07-11 (×8): 500 mg via INTRAVENOUS
  Filled 2016-07-08 (×8): qty 100

## 2016-07-08 MED ORDER — DEXTROSE 5 % IV SOLN
1.0000 g | INTRAVENOUS | Status: DC
  Administered 2016-07-08 – 2016-07-10 (×3): 1 g via INTRAVENOUS
  Filled 2016-07-08 (×4): qty 10

## 2016-07-08 NOTE — Progress Notes (Addendum)
Subjective: Feels back to baseline, but complains of neuropathic pain  Exam: Vitals:   07/08/16 0853 07/08/16 1415  BP: (!) 127/45 (!) 130/49  Pulse: (!) 52 60  Resp: 18 20  Temp: 98 F (36.7 C) 98.3 F (36.8 C)   Gen: In bed, NAD Resp: non-labored breathing, no acute distress Abd: soft, nt  Neuro: MS: awake, alert, oriented and appropriate [id number] Motor: MAEW  Pertinent Labs: TSH neg  Impression: 82 yo F with syncope vs seizure. She has had sedation from gabapentin in the past, but lyrica may provide some benefit. Even though It is not definite that she had a seizure, I think that it is a possibility and lyrica may be worht trying either way for her neuropathy.    Recommendations: 1) Lyrica 75mg  BID 2) will follow.  3) b12  Roland Rack, MD [facility] [phone]  If 7pm- 7am, please page neurology on call as listed in [place].

## 2016-07-08 NOTE — Progress Notes (Addendum)
PROGRESS NOTE    Shari Mcmahon  [id number] DOB: 21-Nov-1926 DOA: 07/07/2016 PCP: Cathlean Cower, MD Brief Narrative: 82 y.o.femalewith PMH of dementia, severe protein calorie malnutrition, dysphagia, recurrent UTIs, type 2 diabetes and peripheral vascular disease (not a candidate for revascularization) with osteomyelitis of the left calcaneus treated with IV antibiotics via a PICC line (which was apparently placed on or about 01/04/16 according to progress notes, and received 14 days of IV cefepime), who was brought in by EMS for syncope. According to the son, she hasnt walked in over a year and just started trying to walk in last few days, her daughter helped her to the bathroom and then fell back and lost consciousness, apparently quickly regained consciousness but was briefly confused there after, then back to normal on route to ER  Assessment & Plan:   1. Syncope -Dehydration/infection mediated vs ? Vasovagal, vs Seizure  -check Orthostatics, according to son just started walking few days ago after being bed bound for 1year -MRI-unremarkable, EEG recommended per Neuro will FU -WBC up this am, CXR with RLL consolidation will add Abx, FU UA -Pt eval  2. Suspected Aspiration pneumonia -add Zosyn, check SLP  3.  History of recurrent UTIs -previous history of VRE sensitive only to linezolid, contract precautions -UA pending  4. Essential hypertension-continue Norvasc, also started on gentle hydration on admission -now of IVF  5.  CONSTIPATION, CHRONIC-continue MiraLAX as needed  6.  Chronic pain syndrome -complaining of bilateral leg pain, history of peripheral vascular disease - no s/s of infection, monitor -PVD not candidate for re vasc per VVS -could not tolerate neurontin due to lethargy  7.  Alzheimer's dementia-continue Namenda and Aricept  8.  Diabetes mellitus, type 2 (HCC) -FU A1c, started on sliding scale insulin  9. Gout-continue colchicine and Uloric   DVT  prophylaxis:Lovenox Code Status: DNR per admission note Family Communication:son at bedside Disposition Plan: pending workup   Consultants:  Neurology  Subjective: Feels better, has chronic urinary incontinence, no fevers or chills, no cough/congestion  Objective: Vitals:   07/07/16 2217 07/08/16 0056 07/08/16 0610 07/08/16 0853  BP: (!) 107/36 (!) 135/45 (!) 156/45 (!) 127/45  Pulse: (!) 58 (!) 52 60 (!) 52  Resp: 16 16 16 18   Temp:  97.6 F (36.4 C) 97.9 F (36.6 C) 98 F (36.7 C)  TempSrc:  Oral Oral Oral  SpO2: 96% 97% 98% 96%    Intake/Output Summary (Last 24 hours) at 07/08/16 1357 Last data filed at 07/07/16 2010  Gross per 24 hour  Intake               75 ml  Output                0 ml  Net               75 ml   There were no vitals filed for this visit.  Examination:  General exam: Appears calm and comfortable, AAOx2 Respiratory system: decreased BS at bases Cardiovascular system: S1 & S2 heard, RRR. No JVD, murmurs, rubs, gallops or clicks. No pedal edema. Gastrointestinal system: Abdomen is nondistended, soft and nontender. No organomegaly or masses felt. Normal bowel sounds heard. Central nervous system: Alert and oriented. No focal neurological deficits. Extremities: both legs weak Skin: No rashes, lesions or ulcers Psychiatry: Judgement and insight appear normal. Mood & affect appropriate.     Data Reviewed: I have personally reviewed following labs and imaging studies  CBC:  Recent Labs  Lab 07/07/16 1027 07/07/16 1034 07/08/16 0203 07/08/16 0942  WBC 7.1  --  20.3* 19.7*  NEUTROABS 5.1  --   --   --   HGB 12.2 13.9 9.8* 10.1*  HCT 36.7 41.0 29.9* 30.4*  MCV 100.0  --  100.3* 98.7  PLT 148*  --  113* [id number]*   Basic Metabolic Panel:  Recent Labs Lab 07/07/16 1027 07/07/16 1034 07/07/16 1431 07/08/16 0203  NA 142 143  --  139  K 3.5 3.5  --  3.4*  CL 110 106  --  109  CO2 23  --   --  23  GLUCOSE 101* 97  --  109*  BUN 9 10  --   12  CREATININE 0.89 0.80  --  0.90  CALCIUM 9.4  --   --  8.5*  MG  --   --  1.6*  --    GFR: CrCl cannot be calculated (Unknown ideal weight.). Liver Function Tests:  Recent Labs Lab 07/07/16 1027 07/07/16 1431 07/08/16 0203  AST 27 22 19   ALT 11* 11* 9*  ALKPHOS 99 93 73  BILITOT 0.6 0.4 0.5  PROT 6.5 6.3* 4.9*  ALBUMIN 3.1* 3.0* 2.3*   No results for input(s): LIPASE, AMYLASE in the last 168 hours. No results for input(s): AMMONIA in the last 168 hours. Coagulation Profile:  Recent Labs Lab 07/07/16 1027  INR 1.07   Cardiac Enzymes:  Recent Labs Lab 07/07/16 1431 07/07/16 2008 07/08/16 0203  TROPONINI 0.03* 0.06* 0.06*   BNP (last 3 results) No results for input(s): PROBNP in the last 8760 hours. HbA1C:  Recent Labs  07/07/16 1431  HGBA1C 4.7*   CBG: No results for input(s): GLUCAP in the last 168 hours. Lipid Profile: No results for input(s): CHOL, HDL, LDLCALC, TRIG, CHOLHDL, LDLDIRECT in the last 72 hours. Thyroid Function Tests:  Recent Labs  07/07/16 1431  TSH 1.270   Anemia Panel: No results for input(s): VITAMINB12, FOLATE, FERRITIN, TIBC, IRON, RETICCTPCT in the last 72 hours. Urine analysis:    Component Value Date/Time   COLORURINE YELLOW 05/24/2016 0925   APPEARANCEUR CLOUDY (A) 05/24/2016 0925   LABSPEC 1.022 05/24/2016 0925   PHURINE 6.0 05/24/2016 0925   GLUCOSEU NEGATIVE 05/24/2016 0925   GLUCOSEU NEGATIVE 11/19/2013 0952   HGBUR TRACE (A) 05/24/2016 0925   BILIRUBINUR NEGATIVE 05/24/2016 0925   BILIRUBINUR negative 08/13/2014 [place] 05/24/2016 0925   PROTEINUR NEGATIVE 05/24/2016 0925   UROBILINOGEN 1.0 06/16/2015 1748   NITRITE NEGATIVE 05/24/2016 0925   LEUKOCYTESUR LARGE (A) 05/24/2016 0925   Sepsis Labs: @LABRCNTIP (procalcitonin:4,lacticidven:4)  )No results found for this or any previous visit (from the past 240 hour(s)).       Radiology Studies: Dg Chest 2 View  Result Date:  07/08/2016 CLINICAL DATA:  Leukocytosis EXAM: CHEST  2 VIEW COMPARISON:  05/24/2016 chest radiograph. FINDINGS: Stable cardiomediastinal silhouette with top-normal heart size and aortic atherosclerosis. No pneumothorax. Trace bilateral pleural effusions. There is new patchy consolidation in the right greater than left lower lobes. No pulmonary edema. IMPRESSION: 1. New patchy consolidation in the right greater than left lower lobes, most suggestive of multifocal pneumonia and/or aspiration pneumonitis. Recommend follow-up PA and lateral post treatment chest radiographs in 4-6 weeks. 2. Trace bilateral pleural effusions. 3. Aortic atherosclerosis. Electronically Signed   By: Ilona Sorrel M.D.   On: 07/08/2016 09:27   Ct Head Wo Contrast  Result Date: 07/07/2016 CLINICAL DATA:  Altered mental status with left  facial droop EXAM: CT HEAD WITHOUT CONTRAST TECHNIQUE: Contiguous axial images were obtained from the base of the skull through the vertex without intravenous contrast. COMPARISON:  01/21/2016 FINDINGS: Brain: Diffuse atrophic changes are identified. Scattered areas of decreased attenuation are noted consistent with chronic white matter ischemic change. These are stable from the prior exam. Vascular: No hyperdense vessel or unexpected calcification. Skull: Normal. Negative for fracture or focal lesion. Sinuses/Orbits: No acute finding. IMPRESSION: Chronic atrophic and ischemic changes.  No acute abnormality noted. These results were called by telephone at the time of interpretation on 07/07/2016 at 10:39 am to Dr. Cristobal Goldmann, who verbally acknowledged these results. Electronically Signed   By: Inez Catalina M.D.   On: 07/07/2016 10:40   Mr Brain Wo Contrast  Result Date: 07/07/2016 CLINICAL DATA:  Initial evaluation for acute syncope. EXAM: MRI HEAD WITHOUT CONTRAST TECHNIQUE: Multiplanar, multiecho pulse sequences of the brain and surrounding structures were obtained without intravenous contrast. COMPARISON:   Prior CT from earlier the same day. FINDINGS: Study mildly degraded by motion artifact. Diffuse prominence of the CSF containing spaces is compatible with generalized age-related cerebral atrophy. Patchy and confluent T2/FLAIR hyperintensity within the periventricular and deep white matter most consistent with chronic small vessel ischemic disease, mild for age. No abnormal foci of restricted diffusion to suggest acute ischemia. Gray-white matter differentiation maintained. Major intracranial vascular flow voids are preserved. No acute or chronic intracranial hemorrhage. No areas of chronic infarction. No mass lesion, midline shift, or mass effect. No hydrocephalus. No extra-axial fluid collection. Major dural sinuses are grossly patent. Craniocervical junction normal. Mild degenerative spondylolysis noted at C3-4 without significant stenosis. Remainder the visualized upper cervical spine otherwise unremarkable. Incidental note made of an empty sella. No acute abnormality about the globes and orbits. Patient is status post lens extraction bilaterally. Paranasal sinuses are clear. No mastoid effusion. Inner ear structures grossly normal. Bone marrow signal intensity within normal limits. No scalp soft tissue abnormality. IMPRESSION: 1. No acute intracranial process identified. 2. Mild for age chronic microvascular ischemic disease. Electronically Signed   By: Jeannine Boga M.D.   On: 07/07/2016 22:12        Scheduled Meds: . amLODipine  5 mg Oral Daily  . aspirin EC  81 mg Oral Daily  . colchicine  0.6 mg Oral Daily  . docusate sodium  100 mg Oral BID  . donepezil  10 mg Oral QHS  . enoxaparin (LOVENOX) injection  30 mg Subcutaneous Q24H  . febuxostat  40 mg Oral Daily  . memantine  28 mg Oral Daily  . pantoprazole  40 mg Oral Daily  . sodium chloride flush  3 mL Intravenous Q12H  . vitamin C  500 mg Oral BID   Continuous Infusions:    LOS: 0 days    Time spent: 29min    Domenic Polite, MD [facility] Pager [phone]  If 7PM-7AM, please contact night-coverage [email or web address] Password [email or web address] 07/08/2016, 1:57 PM

## 2016-07-08 NOTE — Evaluation (Signed)
Clinical/Bedside Swallow Evaluation Patient Details  Name: Shari Mcmahon MRN: [id number] Date of Birth: May 30, 1927  Today's Date: 07/08/2016 Time: SLP Start Time (ACUTE ONLY): 74 SLP Stop Time (ACUTE ONLY): 1525 SLP Time Calculation (min) (ACUTE ONLY): 15 min  Past Medical History:  Past Medical History:  Diagnosis Date  . ABDOMINAL PAIN, CHRONIC 04/07/2008  . Aneurysm of thoracic aorta ([place])    "found 03/2015; not big enough to repair" (07/07/2016)  . ARTHRITIS 03/03/2008  . Arthritis    "shoulders, knees, hands" (07/07/2016)  . Arthritis of knee, degenerative 10/19/2011  . Aspiration pneumonia ([place])   . BRADYCARDIA, CHRONIC 12/17/2008  . CARDIAC MURMUR 02/01/2010  . Complication of anesthesia    "she retains carbon dioxide; sleeps too long" (07/07/2016)  . CONSTIPATION, CHRONIC 10/15/2010  . DEGENERATIVE JOINT DISEASE, KNEES, BILATERAL 12/07/2007  . DEPRESSION 06/17/2009  . DIABETES MELLITUS, TYPE II 09/19/2007   DIET CONTROL   . DVT (deep venous thrombosis) (HCC) BLE  . DYSPHAGIA UNSPECIFIED 12/24/2009  . Gastroparesis 12/24/2009  . GENERALIZED OSTEOARTHROSIS INVOLVING HAND 10/03/2007  . GERD (gastroesophageal reflux disease)   . GLAUCOMA 09/19/2007  . GOUT 09/19/2007  . GOUTY ARTHROPATHY UNSPECIFIED 02/17/2009  . Hepatitis   . HYPERLIPIDEMIA 01/27/2010  . HYPERTENSION 09/19/2007  . LUNG NODULE 03/03/2008  . MENOPAUSAL DISORDER 12/17/2008  . PERIPHERAL NEUROPATHY 06/19/2008  . PERSONAL HISTORY MALIGNANT NEOPLASM STOMACH 09/19/2007  . 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 STOMACH CANCER 03/03/2008  . UTI 12/15/2009   Past Surgical History:  Past Surgical History:  Procedure Laterality Date  . BILROTH II PROCEDURE    . CATARACT EXTRACTION W/ INTRAOCULAR LENS  IMPLANT, BILATERAL    . COLONOSCOPY    . FRACTURE SURGERY    . HIP FRACTURE SURGERY Right 03/2015   HPI:  82 y.o.femalewith PMH of dementia, severe protein calorie malnutrition, dysphagia, recurrent UTIs,  type 2 diabetes and peripheral vascular disease (not a candidate for revascularization) with osteomyelitis of the left calcaneus treated with IV antibiotics via a PICC line  who was brought in by EMS for syncope. According to the son, she hasnt walked in over a year and just started trying to walk in last few days, her daughter helped her to the bathroom and then fell back and lost consciousness, apparently quickly regained consciousness but was briefly confused there after, then back to normal on route to ER. CXR with RLL consolidation, suspect aspiration pna. History of dysphagia includes  esophagram 10/15 "tertiary contractions, feline esophagus" appearance, GERD, HTN, gastric cancer s/p partial gastrectomy, gastroparesis. Last SLp assessment recommended regular soft solids, thin liquids, no straws, water with meals, upright 30-60 minutes.     Assessment / Plan / Recommendation Clinical Impression  Pt demonstrates report and signs of an acute exacerbation of primary chronic esophageal dysphagia. Pt reports decreased ability to transit her foods and liquids over the last week, with globus, early satiety, belching and descrpition of reflux in the back of her throat. Likely primary risk of aspriation is post prandial in nature. Pt verbalized basic aspiration precautions that she and her daughter have been educated on and follow at home.  Recommend pt continue diet with foods of her choosing with basic aspiration and esophageal precautions. F/u with GI most appropriate. Discussed with MD, will await esophagram results which could define any need for SLP f/u.     Aspiration Risk  Moderate aspiration risk;Risk for inadequate nutrition/hydration    Diet Recommendation Regular;Thin liquid   Liquid Administration via:  Cup;No straw Medication Administration: Whole meds with liquid Supervision: Staff to assist with self feeding Compensations: Slow rate;Small sips/bites Postural Changes: Seated upright at 90  degrees    Other  Recommendations Recommended Consults: Consider GI evaluation Oral Care Recommendations: Oral care BID   Follow up Recommendations  None    Frequency and Duration            Prognosis        Swallow Study   General HPI: 82 y.o.femalewith PMH of dementia, severe protein calorie malnutrition, dysphagia, recurrent UTIs, type 2 diabetes and peripheral vascular disease (not a candidate for revascularization) with osteomyelitis of the left calcaneus treated with IV antibiotics via a PICC line  who was brought in by EMS for syncope. According to the son, she hasnt walked in over a year and just started trying to walk in last few days, her daughter helped her to the bathroom and then fell back and lost consciousness, apparently quickly regained consciousness but was briefly confused there after, then back to normal on route to ER. CXR with RLL consolidation, suspect aspiration pna. History of dysphagia includes  esophagram 10/15 "tertiary contractions, feline esophagus" appearance, GERD, HTN, gastric cancer s/p partial gastrectomy, gastroparesis. Last SLp assessment recommended regular soft solids, thin liquids, no straws, water with meals, upright 30-60 minutes.   Type of Study: Bedside Swallow Evaluation Previous Swallow Assessment: see HPI Diet Prior to this Study: Regular;Thin liquids Temperature Spikes Noted: No Respiratory Status: Room air History of Recent Intubation: No Behavior/Cognition: Alert;Cooperative;Pleasant mood Oral Cavity Assessment: Within Functional Limits Oral Care Completed by SLP: No Oral Cavity - Dentition: Adequate natural dentition Vision: Functional for self-feeding Self-Feeding Abilities: Able to feed self Patient Positioning: Upright in bed Baseline Vocal Quality: Normal Volitional Cough: Strong Volitional Swallow: Able to elicit    Oral/Motor/Sensory Function Overall Oral Motor/Sensory Function: Within functional limits   Ice Chips      Thin Liquid Thin Liquid: Impaired Presentation: Cup;Self Fed;Straw Other Comments: globus, belching, fullness    Nectar Thick Nectar Thick Liquid: Not tested   Honey Thick Honey Thick Liquid: Not tested   Puree Puree: Impaired Other Comments: only tolerated two bites, then wet belching   Solid   GO   Solid: Not tested       Herbie Baltimore, MA CCC-SLP [phone]  Nehemiah Montee, Katherene Ponto 07/08/2016,3:50 PM

## 2016-07-08 NOTE — Evaluation (Signed)
Physical Therapy Evaluation Patient Details Name: Shari Mcmahon MRN: [id number] DOB: 10/08/1927 Today's Date: 07/08/2016   History of Present Illness  pt is an 82 y/o female with pmh of Alzheimers dementia, severe protein calorie malnutrition, recurrent UTI's, DM2, PVD with osteo of L calcaneus, admitted after experiencing an episode of unconsciousness/syncope, generalized shaking, confusion and not following commands.  This, along with facial droop and right-sided weakness resolved before reaching ED.  MRI negative.  Clinical Impression  Pt admitted with/for the episode described above.  Pt is generally at a mod to max level for basic mobility at this time with transfers made difficult by limited ROM at both knees as well as general weakness.  Pt currently limited functionally due to the problems listed below.  (see problems list.)  Pt will benefit from PT to maximize function and safety to be able to get home safely with available assist of family.     Follow Up Recommendations Home health PT    Equipment Recommendations   (TBA)    Recommendations for Other Services       Precautions / Restrictions Precautions Precautions: Fall      Mobility  Bed Mobility Overal bed mobility: Needs Assistance Bed Mobility: Rolling;Supine to Sit;Sit to Supine Rolling: Mod assist   Supine to sit: Mod assist Sit to supine: Max assist   General bed mobility comments: stability and physical assist to get to EOB.  pt uses arms and weak Hams to push/pull herself to EOB  Transfers Overall transfer level: Needs assistance   Transfers: Sit to/from Stand Sit to Stand: Max assist;+2 physical assistance;+2 safety/equipment         General transfer comment: Required blocking pt's feet due to knee ROM deficits.  Assisting pt coming forward and up.  pt marched in place and backed up to the bed before assist to sit.  Ambulation/Gait                Stairs            Wheelchair  Mobility    Modified Rankin (Stroke Patients Only)       Balance Overall balance assessment: Needs assistance Sitting-balance support: Single extremity supported;Bilateral upper extremity supported Sitting balance-Leahy Scale: Fair     Standing balance support: Bilateral upper extremity supported Standing balance-Leahy Scale: Poor Standing balance comment: reliance on assist and AD                             Pertinent Vitals/Pain Pain Assessment: Faces Faces Pain Scale: Hurts even more Pain Location: buttock, vague joints Pain Descriptors / Indicators: Grimacing;Sore Pain Intervention(s): Limited activity within patient's tolerance;Monitored during session;Repositioned    Home Living Family/patient expects to be discharged to:: Private residence Living Arrangements: Children;Other relatives Available Help at Discharge: Family Type of Home: House       Home Layout: One level Home Equipment: Walker - 4 wheels;Bedside commode (hospital bed) Additional Comments: no family present to corroborate pt's account    Prior Function Level of Independence: Needs assistance   Gait / Transfers Assistance Needed: per pt, she transfers only from bed to w/c to rollator to toilet and back.  RN states dtr has video of pt walking--not witnessed.  ADL's / Homemaking Assistance Needed: total assist        Hand Dominance   Dominant Hand: Right    Extremity/Trunk Assessment   Upper Extremity Assessment: Defer to OT evaluation  Lower Extremity Assessment: RLE deficits/detail;LLE deficits/detail RLE Deficits / Details: severely limited knee flexion ROM with bony to firm end range at about 30-40*.  Moves fully against gravity in the small range.  At least 3/5.  Maintains stance with assist LLE Deficits / Details: Similar to R side with limited ROM at L knee to 30-40* And generally weak  Cervical / Trunk Assessment: Kyphotic  Communication   Communication: No  difficulties  Cognition Arousal/Alertness: Awake/alert Behavior During Therapy: WFL for tasks assessed/performed Overall Cognitive Status: History of cognitive impairments - at baseline                      General Comments      Exercises        Assessment/Plan    PT Assessment Patient needs continued PT services  PT Diagnosis Difficulty walking;Generalized weakness   PT Problem List Decreased strength;Decreased activity tolerance;Decreased range of motion;Decreased mobility;Decreased coordination;Decreased knowledge of use of DME;Impaired sensation;Pain  PT Treatment Interventions DME instruction;Gait training;Functional mobility training;Therapeutic activities;Therapeutic exercise;Patient/family education   PT Goals (Current goals can be found in the Care Plan section) Acute Rehab PT Goals Patient Stated Goal: back home.  PT Goal Formulation: With patient Time For Goal Achievement: 07/22/16 Potential to Achieve Goals: Good    Frequency Min 3X/week   Barriers to discharge        Co-evaluation               End of Session   Activity Tolerance: Patient tolerated treatment well Patient left: in bed;with call bell/phone within reach;with bed alarm set;with family/visitor present Nurse Communication: Mobility status    Functional Assessment Tool Used: clinical judgement Functional Limitation: Mobility: Walking and moving around Mobility: Walking and Moving Around Current Status [id number]): At least 40 percent but less than 60 percent impaired, limited or restricted Mobility: Walking and Moving Around Goal Status [phone]): At least 20 percent but less than 40 percent impaired, limited or restricted    Time: 1120-1212 PT Time Calculation (min) (ACUTE ONLY): 52 min   Charges:   PT Evaluation $PT Eval Moderate Complexity: 1 Procedure PT Treatments $Therapeutic Activity: 8-22 mins   PT G Codes:   PT G-Codes **NOT FOR INPATIENT CLASS** Functional Assessment  Tool Used: clinical judgement Functional Limitation: Mobility: Walking and moving around Mobility: Walking and Moving Around Current Status [id number]): At least 40 percent but less than 60 percent impaired, limited or restricted Mobility: Walking and Moving Around Goal Status [phone]): At least 20 percent but less than 40 percent impaired, limited or restricted    Jayvan Mcshan, Tessie Fass 07/08/2016, 12:55 PM 07/08/2016  Donnella Sham, PT [phone]  (pager)

## 2016-07-08 NOTE — Evaluation (Signed)
Occupational Therapy Evaluation Patient Details Name: Shari Mcmahon MRN: [id number] DOB: 11-18-1926 Today's Date: 07/08/2016    History of Present Illness pt is an 82 y/o female with pmh of Alzheimers dementia, severe protein calorie malnutrition, recurrent UTI's, DM2, PVD with osteo of L calcaneus, admitted after experiencing an episode of unconsciousness/syncope, generalized shaking, confusion and not following commands.  This, along with facial droop and right-sided weakness resolved before reaching ED.  MRI negative.   Clinical Impression   PT admitted with LOC (+) with pending workup. Pt currently with functional limitiations due to the deficits listed below (see OT problem list). Pt is from home with family support. No family present to confirm patients accounts but pt able to report reason for admission.  Pt will benefit from skilled OT to increase their independence and safety with adls and balance to allow discharge Welcome.     Follow Up Recommendations  Home health OT    Equipment Recommendations  3 in 1 bedside comode;Wheelchair (measurements OT);Wheelchair cushion (measurements OT)    Recommendations for Other Services       Precautions / Restrictions Precautions Precautions: Fall      Mobility Bed Mobility Overal bed mobility: Needs Assistance Bed Mobility: Rolling;Supine to Sit;Sit to Supine Rolling: Mod assist   Supine to sit: Mod assist Sit to supine: Max assist   General bed mobility comments: stability and physical assist to get to EOB.  pt uses arms and weak Hams to push/pull herself to EOB  Transfers Overall transfer level: Needs assistance   Transfers: Sit to/from Stand Sit to Stand: Max assist;+2 physical assistance;+2 safety/equipment         General transfer comment: Required blocking pt's feet due to knee ROM deficits.  Assisting pt coming forward and up.  pt marched in place and backed up to the bed before assist to sit.    Balance  Overall balance assessment: Needs assistance Sitting-balance support: Single extremity supported Sitting balance-Leahy Scale: Fair     Standing balance support: Bilateral upper extremity supported Standing balance-Leahy Scale: Poor Standing balance comment: reliance on assist and AD                            ADL Overall ADL's : Needs assistance/impaired Eating/Feeding: Minimal assistance   Grooming: Wash/dry hands;Wash/dry face;Oral care;Minimal assistance   Upper Body Bathing: Maximal assistance   Lower Body Bathing: Total assistance   Upper Body Dressing : Maximal assistance   Lower Body Dressing: Total assistance   Toilet Transfer: Maximal assistance;+2 for physical assistance;RW (sit <>Stand) Toilet Transfer Details (indicate cue type and reason): pt only able to complete sit<>Stand at this time.           General ADL Comments: pt with not family present to confirm ability to help at home. pt reports being bed bound mainly and out of bed as her pain allows     Vision     Perception     Praxis      Pertinent Vitals/Pain Pain Assessment: Faces Faces Pain Scale: Hurts even more Pain Location: joint pain and buttock Pain Descriptors / Indicators: Grimacing Pain Intervention(s): Limited activity within patient's tolerance;Repositioned;Monitored during session     Hand Dominance Right   Extremity/Trunk Assessment Upper Extremity Assessment Upper Extremity Assessment: RUE deficits/detail;LUE deficits/detail RUE Deficits / Details: shoulder flexion ~30 degrees reports pain with shoulder flexion, pt with decr grasp and reports i can't open this hand like i use to.  pt able to make a minimal fist LUE Deficits / Details: shoulder flexion 80 degrees able to reach top of head and using L UE to aide R UE   Lower Extremity Assessment Lower Extremity Assessment: Defer to PT evaluation RLE Deficits / Details: severely limited knee flexion ROM with bony to firm  end range at about 30-40*.  Moves fully against gravity in the small range.  At least 3/5.  Maintains stance with assist RLE Sensation: history of peripheral neuropathy RLE Coordination: decreased fine motor;decreased gross motor LLE Deficits / Details: Similar to R side with limited ROM at L knee to 30-40* And generally weak LLE Sensation: history of peripheral neuropathy LLE Coordination: decreased fine motor;decreased gross motor   Cervical / Trunk Assessment Cervical / Trunk Assessment: Kyphotic   Communication Communication Communication: No difficulties   Cognition Arousal/Alertness: Awake/alert Behavior During Therapy: WFL for tasks assessed/performed Overall Cognitive Status: History of cognitive impairments - at baseline                     General Comments       Exercises       Shoulder Instructions      Home Living Family/patient expects to be discharged to:: Private residence Living Arrangements: Children;Other relatives Available Help at Discharge: Family Type of Home: House       Home Layout: One level     Bathroom Shower/Tub: Teacher, early years/pre: Standard     Home Equipment: Environmental consultant - 4 wheels;Bedside commode   Additional Comments: no family present to corroborate pt's account. pt reports not walking for 1 year since hip injury and recently returning home with family from SNF      Prior Functioning/Environment Level of Independence: Needs assistance  Gait / Transfers Assistance Needed: per pt, she transfers only from bed to w/c to rollator to toilet and back.  RN states dtr has video of pt walking--not witnessed. ADL's / Homemaking Assistance Needed: total assist- reports sponge bath due to shower to difficult        OT Diagnosis: Generalized weakness;Acute pain   OT Problem List: Decreased strength;Decreased activity tolerance;Impaired balance (sitting and/or standing);Decreased safety awareness;Decreased knowledge of use  of DME or AE;Decreased knowledge of precautions;Pain   OT Treatment/Interventions: Self-care/ADL training;Therapeutic exercise;DME and/or AE instruction;Therapeutic activities;Cognitive remediation/compensation;Patient/family education;Balance training    OT Goals(Current goals can be found in the care plan section) Acute Rehab OT Goals Patient Stated Goal: back home.  OT Goal Formulation: Patient unable to participate in goal setting Time For Goal Achievement: 07/22/16 Potential to Achieve Goals: Good ADL Goals Pt Will Transfer to Toilet: squat pivot transfer;with +2 assist;with min assist;bedside commode Additional ADL Goal #1: Pt will complete bed mobility Min (A) level as precursor for adls at eob Additional ADL Goal #2: Pt will complete basic transfer mod (A) as precursor to adls.   OT Frequency: Min 2X/week   Barriers to D/C:            Co-evaluation              End of Session Equipment Utilized During Treatment: Gait belt;Rolling walker Nurse Communication: Mobility status;Precautions  Activity Tolerance: Patient limited by pain Patient left: in bed;with call bell/phone within reach (PT Ken in room setting up the room and patient)   Time: 1120-1150 OT Time Calculation (min): 30 min Charges:  OT General Charges $OT Visit: 1 Procedure OT Evaluation $OT Eval Moderate Complexity: 1 Procedure G-Codes: OT G-codes **NOT FOR  INPATIENT CLASS** Functional Assessment Tool Used: clinicial judgement Functional Limitation: Self care Self Care Current Status [id number]): At least 80 percent but less than 100 percent impaired, limited or restricted Self Care Goal Status [id number]): At least 20 percent but less than 40 percent impaired, limited or restricted  Parke Poisson B 07/08/2016, 2:39 PM   Jeri Modena   OTR/L Pager: [phone] Office: [phone] .

## 2016-07-08 NOTE — Consult Note (Signed)
[facility] Nurse wound consult note Reason for Consult: Consult requested for heels.  Pt reports she previously had pressure injuries to bilat heels which have healed at this time.  Bilat heels with pale-colored scar tissue; no open wound or drainage.  No topical treatment indicated at this time.  Float heels to reduce pressure.   Wound type: Left outer foot with deep tissue injury; 2X2cm darker-colored skin to 50% of area, 50% red dry previous blister which has ruptured and evolved into a partial thickness wound.  No odor or drainage or fluctuance. Pressure Ulcer POA: Yes Dressing procedure/placement/frequency: Foam dressing to protect and promote healing. Please re-consult if further assistance is needed.  Thank-you,  Julien Girt MSN, [place], [place], [place], [place]

## 2016-07-09 ENCOUNTER — Observation Stay (HOSPITAL_COMMUNITY): Payer: Medicare Other

## 2016-07-09 DIAGNOSIS — I10 Essential (primary) hypertension: Secondary | ICD-10-CM | POA: Diagnosis not present

## 2016-07-09 DIAGNOSIS — E1143 Type 2 diabetes mellitus with diabetic autonomic (poly)neuropathy: Secondary | ICD-10-CM | POA: Diagnosis not present

## 2016-07-09 DIAGNOSIS — N390 Urinary tract infection, site not specified: Secondary | ICD-10-CM | POA: Diagnosis present

## 2016-07-09 DIAGNOSIS — B3781 Candidal esophagitis: Secondary | ICD-10-CM | POA: Diagnosis not present

## 2016-07-09 DIAGNOSIS — R1310 Dysphagia, unspecified: Secondary | ICD-10-CM | POA: Diagnosis not present

## 2016-07-09 DIAGNOSIS — R55 Syncope and collapse: Secondary | ICD-10-CM | POA: Diagnosis not present

## 2016-07-09 DIAGNOSIS — Z85028 Personal history of other malignant neoplasm of stomach: Secondary | ICD-10-CM | POA: Diagnosis not present

## 2016-07-09 DIAGNOSIS — L97429 Non-pressure chronic ulcer of left heel and midfoot with unspecified severity: Secondary | ICD-10-CM | POA: Diagnosis not present

## 2016-07-09 DIAGNOSIS — M109 Gout, unspecified: Secondary | ICD-10-CM | POA: Diagnosis present

## 2016-07-09 DIAGNOSIS — G309 Alzheimer's disease, unspecified: Secondary | ICD-10-CM | POA: Diagnosis not present

## 2016-07-09 DIAGNOSIS — K2950 Unspecified chronic gastritis without bleeding: Secondary | ICD-10-CM | POA: Diagnosis not present

## 2016-07-09 DIAGNOSIS — Z66 Do not resuscitate: Secondary | ICD-10-CM | POA: Diagnosis present

## 2016-07-09 DIAGNOSIS — Z87440 Personal history of urinary (tract) infections: Secondary | ICD-10-CM | POA: Diagnosis not present

## 2016-07-09 DIAGNOSIS — E785 Hyperlipidemia, unspecified: Secondary | ICD-10-CM | POA: Diagnosis not present

## 2016-07-09 DIAGNOSIS — Z87891 Personal history of nicotine dependence: Secondary | ICD-10-CM | POA: Diagnosis not present

## 2016-07-09 DIAGNOSIS — F0280 Dementia in other diseases classified elsewhere without behavioral disturbance: Secondary | ICD-10-CM | POA: Diagnosis not present

## 2016-07-09 DIAGNOSIS — B9620 Unspecified Escherichia coli [E. coli] as the cause of diseases classified elsewhere: Secondary | ICD-10-CM | POA: Diagnosis present

## 2016-07-09 DIAGNOSIS — M79605 Pain in left leg: Secondary | ICD-10-CM | POA: Diagnosis present

## 2016-07-09 DIAGNOSIS — J690 Pneumonitis due to inhalation of food and vomit: Secondary | ICD-10-CM | POA: Diagnosis not present

## 2016-07-09 DIAGNOSIS — R32 Unspecified urinary incontinence: Secondary | ICD-10-CM | POA: Diagnosis present

## 2016-07-09 DIAGNOSIS — G894 Chronic pain syndrome: Secondary | ICD-10-CM | POA: Diagnosis present

## 2016-07-09 DIAGNOSIS — H409 Unspecified glaucoma: Secondary | ICD-10-CM | POA: Diagnosis present

## 2016-07-09 DIAGNOSIS — M79604 Pain in right leg: Secondary | ICD-10-CM | POA: Diagnosis present

## 2016-07-09 DIAGNOSIS — E860 Dehydration: Secondary | ICD-10-CM | POA: Diagnosis present

## 2016-07-09 DIAGNOSIS — E1151 Type 2 diabetes mellitus with diabetic peripheral angiopathy without gangrene: Secondary | ICD-10-CM | POA: Diagnosis not present

## 2016-07-09 DIAGNOSIS — K5900 Constipation, unspecified: Secondary | ICD-10-CM | POA: Diagnosis present

## 2016-07-09 DIAGNOSIS — K222 Esophageal obstruction: Secondary | ICD-10-CM | POA: Diagnosis not present

## 2016-07-09 DIAGNOSIS — K3189 Other diseases of stomach and duodenum: Secondary | ICD-10-CM | POA: Diagnosis not present

## 2016-07-09 LAB — CBC
HEMATOCRIT: 25.8 % — AB (ref 36.0–46.0)
Hemoglobin: 8.7 g/dL — ABNORMAL LOW (ref 12.0–15.0)
MCH: 33.3 pg (ref 26.0–34.0)
MCHC: 33.7 g/dL (ref 30.0–36.0)
MCV: 98.9 fL (ref 78.0–100.0)
PLATELETS: 99 10*3/uL — AB (ref 150–400)
RBC: 2.61 MIL/uL — ABNORMAL LOW (ref 3.87–5.11)
RDW: 14.9 % (ref 11.5–15.5)
WBC: 13.5 10*3/uL — ABNORMAL HIGH (ref 4.0–10.5)

## 2016-07-09 LAB — BASIC METABOLIC PANEL
Anion gap: 3 — ABNORMAL LOW (ref 5–15)
BUN: 11 mg/dL (ref 6–20)
CHLORIDE: 111 mmol/L (ref 101–111)
CO2: 25 mmol/L (ref 22–32)
CREATININE: 0.89 mg/dL (ref 0.44–1.00)
Calcium: 8.5 mg/dL — ABNORMAL LOW (ref 8.9–10.3)
GFR calc Af Amer: >60 mL/min (ref >60)
GFR calc non Af Amer: 57 mL/min — ABNORMAL LOW (ref >60)
GLUCOSE: 75 mg/dL (ref 65–99)
POTASSIUM: 3.4 mmol/L — AB (ref 3.5–5.1)
Sodium: 139 mmol/L (ref 135–145)

## 2016-07-09 MED ORDER — LATANOPROST 0.005 % OP SOLN
1.0000 [drp] | Freq: Every day | OPHTHALMIC | Status: DC
Start: 2016-07-09 — End: 2016-07-13
  Administered 2016-07-09 – 2016-07-12 (×4): 1 [drp] via OPHTHALMIC
  Filled 2016-07-09: qty 2.5

## 2016-07-09 NOTE — Progress Notes (Signed)
PROGRESS NOTE    Shari Mcmahon  [id number] DOB: [birth year] DOA: 07/07/2016 PCP: Cathlean Cower, MD Brief Narrative: 82 y.o.femalewith PMH of dementia, severe protein calorie malnutrition, dysphagia, recurrent UTIs, type 2 diabetes and peripheral vascular disease (not a candidate for revascularization), who was brought in by EMS for syncope. According to the son, she hasnt walked in over a year and just started trying to walk in last few days, her daughter helped her to the bathroom and then fell back and lost consciousness, apparently quickly regained consciousness but was briefly confused there after, then back to normal on route to ER  Assessment & Plan:   1. Syncope -Dehydration/infection mediated vs ? Vasovagal, vs Seizure  -Orthostatics couldn't be checked as pt was too unsteady, according to son just started walking few days ago after being bed bound for 1year -MRI-unremarkable, EEG recommended per Neuro will FU -WBC up to 20K, CXR with RLL consolidation-started Abc and UA abnormal too -Pt eval  2. Aspiration pneumonia -on Ceftriaxone and Flagyl, has PCN allergy -long standing dysphagia, h/o gastric cancer 2010; resected. EGD 07/2014 Ardis Hughs which showed a mildly erythematous Billroth II anastomosis but widely patent -s/p SLP eval, Esophagram ordered will FU-options significantly limited, Seen by Palliative care regarding same issues in March  3. Dysphagia -chronic, see discussion in #2  4. 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 Essential hypertension -BP soft but normal, hold norvasc  5.  CONSTIPATION, CHRONIC -continue MiraLAX as needed  6.  Chronic pain syndrome -complaining of bilateral leg pain, history of peripheral vascular disease - no s/s of infection, monitor -PVD not candidate for re vasc per [facility] -could not tolerate neurontin due to lethargy, started on Low dose  lyrica per Neuro yesterday  7.  Alzheimer's dementia-continue Namenda and Aricept  8.  Diabetes mellitus, type 2 (HCC) -FU A1c, started on sliding scale insulin, CBGs soft, monitor  9. Gout-continue colchicine and Uloric   DVT prophylaxis:Lovenox Code Status: DNR per admission note and prior documentation  Family Communication:son at bedside 9/8, was unable to reach daughter left msg Disposition Plan: pending workup   Consultants:  Neurology  Subjective: Feels ok, has leg pains, has chronic urinary incontinence, no fevers or chills, mild cough, no dyspnea  Objective: Vitals:   07/08/16 1810 07/08/16 2029 07/09/16 0109 07/09/16 0453  BP: (!) 127/39 (!) 125/44 (!) 107/41 (!) 102/37  Pulse: 84 (!) 54 (!) 48 (!) 47  Resp:  18 16 16   Temp:  98 F (36.7 C) 98.6 F (37 C) 98.6 F (37 C)  TempSrc:   Oral Oral  SpO2:  97% 96% 97%    Intake/Output Summary (Last 24 hours) at 07/09/16 0939 Last data filed at 07/08/16 2145  Gross per 24 hour  Intake              340 ml  Output                0 ml  Net              340 ml   There were no vitals filed for this visit.  Examination:  General exam: Appears calm and comfortable, AAOx2 Respiratory system: decreased BS at bases Cardiovascular system: S1 & S2 heard, RRR. No JVD, murmurs, rubs, gallops or clicks. No pedal edema. Gastrointestinal system: Abdomen is nondistended, soft and nontender. No organomegaly or masses felt. Normal bowel sounds heard.  Central nervous system: Alert and oriented. No focal neurological deficits. Extremities: both legs weak Skin: No rashes, lesions or ulcers Psychiatry: Mood & affect appropriate.     Data Reviewed: I have personally reviewed following labs and imaging studies  CBC:  Recent Labs Lab 07/07/16 1027 07/07/16 1034 07/08/16 0203 07/08/16 0942 07/09/16 0522  WBC 7.1  --  20.3* 19.7* 13.5*  NEUTROABS 5.1  --   --   --   --   HGB 12.2 13.9 9.8* 10.1* 8.7*  HCT 36.7 41.0 29.9*  30.4* 25.8*  MCV 100.0  --  100.3* 98.7 98.9  PLT 148*  --  113* 126* 99*   Basic Metabolic Panel:  Recent Labs Lab 07/07/16 1027 07/07/16 1034 07/07/16 1431 07/08/16 0203 07/09/16 0522  NA 142 143  --  139 139  K 3.5 3.5  --  3.4* 3.4*  CL 110 106  --  109 111  CO2 23  --   --  23 25  GLUCOSE 101* 97  --  109* 75  BUN 9 10  --  12 11  CREATININE 0.89 0.80  --  0.90 0.89  CALCIUM 9.4  --   --  8.5* 8.5*  MG  --   --  1.6*  --   --    GFR: CrCl cannot be calculated (Unknown ideal weight.). Liver Function Tests:  Recent Labs Lab 07/07/16 1027 07/07/16 1431 07/08/16 0203  AST 27 22 19   ALT 11* 11* 9*  ALKPHOS 99 93 73  BILITOT 0.6 0.4 0.5  PROT 6.5 6.3* 4.9*  ALBUMIN 3.1* 3.0* 2.3*   No results for input(s): LIPASE, AMYLASE in the last 168 hours. No results for input(s): AMMONIA in the last 168 hours. Coagulation Profile:  Recent Labs Lab 07/07/16 1027  INR 1.07   Cardiac Enzymes:  Recent Labs Lab 07/07/16 1431 07/07/16 2008 07/08/16 0203  TROPONINI 0.03* 0.06* 0.06*   BNP (last 3 results) No results for input(s): PROBNP in the last 8760 hours. HbA1C:  Recent Labs  07/07/16 1431  HGBA1C 4.7*   CBG: No results for input(s): GLUCAP in the last 168 hours. Lipid Profile: No results for input(s): CHOL, HDL, LDLCALC, TRIG, CHOLHDL, LDLDIRECT in the last 72 hours. Thyroid Function Tests:  Recent Labs  07/07/16 1431  TSH 1.270   Anemia Panel:  Recent Labs  07/08/16 1923  VITAMINB12 1,232*   Urine analysis:    Component Value Date/Time   COLORURINE YELLOW 07/08/2016 1902   APPEARANCEUR TURBID (A) 07/08/2016 1902   LABSPEC 1.021 07/08/2016 1902   PHURINE 5.5 07/08/2016 1902   GLUCOSEU NEGATIVE 07/08/2016 1902   GLUCOSEU NEGATIVE 11/19/2013 0952   HGBUR TRACE (A) 07/08/2016 1902   BILIRUBINUR NEGATIVE 07/08/2016 1902   BILIRUBINUR negative 08/13/2014 1207   [place] 07/08/2016 1902   PROTEINUR NEGATIVE 07/08/2016 1902    UROBILINOGEN 1.0 06/16/2015 1748   NITRITE NEGATIVE 07/08/2016 1902   LEUKOCYTESUR LARGE (A) 07/08/2016 1902   Sepsis Labs: @LABRCNTIP (procalcitonin:4,lacticidven:4)  )No results found for this or any previous visit (from the past 240 hour(s)).       Radiology Studies: Dg Chest 2 View  Result Date: 07/08/2016 CLINICAL DATA:  Leukocytosis EXAM: CHEST  2 VIEW COMPARISON:  05/24/2016 chest radiograph. FINDINGS: Stable cardiomediastinal silhouette with top-normal heart size and aortic atherosclerosis. No pneumothorax. Trace bilateral pleural effusions. There is new patchy consolidation in the right greater than left lower lobes. No pulmonary edema. IMPRESSION: 1. New patchy consolidation in the right greater  than left lower lobes, most suggestive of multifocal pneumonia and/or aspiration pneumonitis. Recommend follow-up PA and lateral post treatment chest radiographs in 4-6 weeks. 2. Trace bilateral pleural effusions. 3. Aortic atherosclerosis. Electronically Signed   By: Ilona Sorrel M.D.   On: 07/08/2016 09:27   Ct Head Wo Contrast  Result Date: 07/07/2016 CLINICAL DATA:  Altered mental status with left facial droop EXAM: CT HEAD WITHOUT CONTRAST TECHNIQUE: Contiguous axial images were obtained from the base of the skull through the vertex without intravenous contrast. COMPARISON:  01/21/2016 FINDINGS: Brain: Diffuse atrophic changes are identified. Scattered areas of decreased attenuation are noted consistent with chronic white matter ischemic change. These are stable from the prior exam. Vascular: No hyperdense vessel or unexpected calcification. Skull: Normal. Negative for fracture or focal lesion. Sinuses/Orbits: No acute finding. IMPRESSION: Chronic atrophic and ischemic changes.  No acute abnormality noted. These results were called by telephone at the time of interpretation on 07/07/2016 at 10:39 am to Dr. Cristobal Goldmann, who verbally acknowledged these results. Electronically Signed   By: Inez Catalina M.D.   On: 07/07/2016 10:40   Mr Brain Wo Contrast  Result Date: 07/07/2016 CLINICAL DATA:  Initial evaluation for acute syncope. EXAM: MRI HEAD WITHOUT CONTRAST TECHNIQUE: Multiplanar, multiecho pulse sequences of the brain and surrounding structures were obtained without intravenous contrast. COMPARISON:  Prior CT from earlier the same day. FINDINGS: Study mildly degraded by motion artifact. Diffuse prominence of the CSF containing spaces is compatible with generalized age-related cerebral atrophy. Patchy and confluent T2/FLAIR hyperintensity within the periventricular and deep white matter most consistent with chronic small vessel ischemic disease, mild for age. No abnormal foci of restricted diffusion to suggest acute ischemia. Gray-white matter differentiation maintained. Major intracranial vascular flow voids are preserved. No acute or chronic intracranial hemorrhage. No areas of chronic infarction. No mass lesion, midline shift, or mass effect. No hydrocephalus. No extra-axial fluid collection. Major dural sinuses are grossly patent. Craniocervical junction normal. Mild degenerative spondylolysis noted at C3-4 without significant stenosis. Remainder the visualized upper cervical spine otherwise unremarkable. Incidental note made of an empty sella. No acute abnormality about the globes and orbits. Patient is status post lens extraction bilaterally. Paranasal sinuses are clear. No mastoid effusion. Inner ear structures grossly normal. Bone marrow signal intensity within normal limits. No scalp soft tissue abnormality. IMPRESSION: 1. No acute intracranial process identified. 2. Mild for age chronic microvascular ischemic disease. Electronically Signed   By: Jeannine Boga M.D.   On: 07/07/2016 22:12        Scheduled Meds: . amLODipine  5 mg Oral Daily  . aspirin EC  81 mg Oral Daily  . cefTRIAXone (ROCEPHIN)  IV  1 g Intravenous Q24H  . colchicine  0.6 mg Oral Daily  . docusate sodium   100 mg Oral BID  . donepezil  10 mg Oral QHS  . enoxaparin (LOVENOX) injection  30 mg Subcutaneous Q24H  . febuxostat  40 mg Oral Daily  . memantine  28 mg Oral Daily  . metronidazole  500 mg Intravenous Q8H  . pantoprazole  40 mg Oral Daily  . pregabalin  75 mg Oral BID  . sodium chloride flush  3 mL Intravenous Q12H  . vitamin C  500 mg Oral BID   Continuous Infusions:    LOS: 0 days    Time spent: 2min    Domenic Polite, MD [facility] Pager [phone]  If 7PM-7AM, please contact night-coverage [email or web address] Password [email or web address] 07/09/2016, 9:39 AM

## 2016-07-09 NOTE — Progress Notes (Signed)
Subjective: Feels her pain may be slightly better  Exam: Vitals:   07/09/16 1027 07/09/16 1409  BP: (!) 110/47 (!) 115/39  Pulse: (!) 52 (!) 48  Resp: 18 18  Temp: 98.2 F (36.8 C) 97.2 F (36.2 C)   Gen: In bed, NAD Resp: non-labored breathing, no acute distress Abd: soft, nt  Neuro: MS: awake, alert, oriented and appropriate [id number] Motor: MAEW  Pertinent Labs: B12 not low  Impression: 82 yo F with syncope vs seizure. She has had sedation from gabapentin in the past, but lyrica may provide some benefit. Even though It is not definite that she had a seizure, I think that it is a possibility and lyrica may be worth trying either way for her neuropathy. Given that she seems to be tolerating it, and it may be helping with her neuropathy I would favor continuing this. If this did represent seizure, then I could be helpful for that as well.   Recommendations: 1) continue Lyrica 75mg  BID 2)  follow-up with neurology as an outpatient, neurology will sign off at this time please call if further questions or concerns.   Roland Rack, MD [facility] [phone]  If 7pm- 7am, please page neurology on call as listed in [place].

## 2016-07-10 DIAGNOSIS — J690 Pneumonitis due to inhalation of food and vomit: Secondary | ICD-10-CM

## 2016-07-10 NOTE — Progress Notes (Signed)
PROGRESS NOTE    Shari Mcmahon  [id number] DOB: 1927/01/04 DOA: 07/07/2016 PCP: Cathlean Cower, MD Brief Narrative: 82 y.o.femalewith PMH of dementia, severe protein calorie malnutrition, dysphagia, recurrent UTIs, type 2 diabetes and peripheral vascular disease (not a candidate for revascularization), who was brought in by EMS for syncope. According to the son, she hasnt walked in over a year and just started trying to walk in last few days, her daughter helped her to the bathroom and then fell back and lost consciousness, apparently quickly regained consciousness but was briefly confused there after, then back to normal on route to ER  Assessment & Plan:   1. Syncope -Dehydration/infection mediated vs ? Vasovagal, vs Seizure  -Orthostatics couldn't be checked as pt was too unsteady, according to son just started walking few days ago after being bed bound for 1year -MRI-unremarkable, EEG recommended per Neuro will FU -WBC up to 20K, CXR with RLL consolidation-started Abc and UA abnormal too -Pt eval completed -ECHO with normal EF  2. Aspiration pneumonia -on Ceftriaxone and Flagyl Day 2, has PCN allergy, also has Ecoli UTI, change to PO Abx tomorrow if stable -long standing dysphagia, h/o gastric cancer 2010; resected. EGD 07/2014 per Dr.Jacobs which showed a mildly erythematous Billroth II anastomosis but widely patent -s/p SLP eval, Esophagram ordered will FU-options significantly limited, Seen by Palliative care regarding same issues in March -continue current diet with aspiration precuations  3. Dysphagia -chronic, see discussion in #2  4. 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 Essential hypertension -BP soft but normal, hold norvasc  5.  CONSTIPATION, CHRONIC -continue MiraLAX as needed  6.  Chronic pain syndrome -complaining of bilateral leg pain,  history of peripheral vascular disease - no s/s of infection, monitor -PVD not candidate for re vasc per [facility] -could not tolerate neurontin due to lethargy, started on Low dose lyrica per Neuro yesterday  7.  Alzheimer's dementia-continue Namenda and Aricept  8.  Diabetes mellitus, type 2 (HCC) -HbA1c is 4.7, started on sliding scale insulin, CBGs soft, monitor  9. Gout-continue colchicine and Uloric   DVT prophylaxis:Lovenox Code Status: DNR per admission note and prior documentation  Family Communication:son at bedside 9/8, called and d/w daughter Hulda Marin 9/9 Disposition Plan:Home in 1-2days   Consultants:  Neurology  Subjective: Leg pains better, no dyspnea, swallowing ok, some weakness  Objective: Vitals:   07/09/16 2147 07/10/16 0207 07/10/16 0543 07/10/16 1006  BP: (!) 107/36 (!) 148/40 (!) 149/45 (!) 137/46  Pulse: (!) 49 (!) 45 (!) 47 (!) 50  Resp: 14 16 18 20   Temp: 98.1 F (36.7 C) 97.8 F (36.6 C) 98 F (36.7 C) 98.7 F (37.1 C)  TempSrc: Oral Oral Axillary Oral  SpO2: 98% 95% 97% 98%   No intake or output data in the 24 hours ending 07/10/16 1045 There were no vitals filed for this visit.  Examination:  General exam: Appears calm and comfortable, AAOx2 Respiratory system: decreased BS at bases Cardiovascular system: S1 & S2 heard, RRR. No JVD, murmurs, rubs, gallops or clicks. No pedal edema. Gastrointestinal system: Abdomen is nondistended, soft and nontender. No organomegaly or masses felt. Normal bowel sounds heard. Central nervous system: Alert and oriented. No focal neurological deficits. Extremities: both legs weak Skin: No rashes, lesions or ulcers Psychiatry: Mood & affect appropriate.     Data Reviewed: I have personally reviewed following labs and  imaging studies  CBC:  Recent Labs Lab 07/07/16 1027 07/07/16 1034 07/08/16 0203 07/08/16 0942 07/09/16 0522  WBC 7.1  --  20.3* 19.7* 13.5*  NEUTROABS 5.1  --   --   --   --   HGB  12.2 13.9 9.8* 10.1* 8.7*  HCT 36.7 41.0 29.9* 30.4* 25.8*  MCV 100.0  --  100.3* 98.7 98.9  PLT 148*  --  113* 126* 99*   Basic Metabolic Panel:  Recent Labs Lab 07/07/16 1027 07/07/16 1034 07/07/16 1431 07/08/16 0203 07/09/16 0522  NA 142 143  --  139 139  K 3.5 3.5  --  3.4* 3.4*  CL 110 106  --  109 111  CO2 23  --   --  23 25  GLUCOSE 101* 97  --  109* 75  BUN 9 10  --  12 11  CREATININE 0.89 0.80  --  0.90 0.89  CALCIUM 9.4  --   --  8.5* 8.5*  MG  --   --  1.6*  --   --    GFR: CrCl cannot be calculated (Unknown ideal weight.). Liver Function Tests:  Recent Labs Lab 07/07/16 1027 07/07/16 1431 07/08/16 0203  AST 27 22 19   ALT 11* 11* 9*  ALKPHOS 99 93 73  BILITOT 0.6 0.4 0.5  PROT 6.5 6.3* 4.9*  ALBUMIN 3.1* 3.0* 2.3*   No results for input(s): LIPASE, AMYLASE in the last 168 hours. No results for input(s): AMMONIA in the last 168 hours. Coagulation Profile:  Recent Labs Lab 07/07/16 1027  INR 1.07   Cardiac Enzymes:  Recent Labs Lab 07/07/16 1431 07/07/16 2008 07/08/16 0203  TROPONINI 0.03* 0.06* 0.06*   BNP (last 3 results) No results for input(s): PROBNP in the last 8760 hours. HbA1C:  Recent Labs  07/07/16 1431  HGBA1C 4.7*   CBG: No results for input(s): GLUCAP in the last 168 hours. Lipid Profile: No results for input(s): CHOL, HDL, LDLCALC, TRIG, CHOLHDL, LDLDIRECT in the last 72 hours. Thyroid Function Tests:  Recent Labs  07/07/16 1431  TSH 1.270   Anemia Panel:  Recent Labs  07/08/16 1923  VITAMINB12 1,232*   Urine analysis:    Component Value Date/Time   COLORURINE YELLOW 07/08/2016 1902   APPEARANCEUR TURBID (A) 07/08/2016 1902   LABSPEC 1.021 07/08/2016 1902   PHURINE 5.5 07/08/2016 1902   GLUCOSEU NEGATIVE 07/08/2016 1902   GLUCOSEU NEGATIVE 11/19/2013 0952   HGBUR TRACE (A) 07/08/2016 1902   BILIRUBINUR NEGATIVE 07/08/2016 1902   BILIRUBINUR negative 08/13/2014 [place] 07/08/2016  1902   PROTEINUR NEGATIVE 07/08/2016 1902   UROBILINOGEN 1.0 06/16/2015 1748   NITRITE NEGATIVE 07/08/2016 1902   LEUKOCYTESUR LARGE (A) 07/08/2016 1902   Sepsis Labs: @LABRCNTIP (procalcitonin:4,lacticidven:4)  ) Recent Results (from the past 240 hour(s))  Culture, Urine     Status: Abnormal (Preliminary result)   Collection Time: 07/08/16  7:02 PM  Result Value Ref Range Status   Specimen Description URINE, RANDOM  Final   Special Requests NONE  Final   Culture >=100,000 COLONIES/mL ESCHERICHIA COLI (A)  Final   Report Status PENDING  Incomplete         Radiology Studies: No results found.      Scheduled Meds: . aspirin EC  81 mg Oral Daily  . cefTRIAXone (ROCEPHIN)  IV  1 g Intravenous Q24H  . colchicine  0.6 mg Oral Daily  . docusate sodium  100 mg Oral BID  . donepezil  10 mg Oral QHS  . enoxaparin (LOVENOX) injection  30 mg Subcutaneous Q24H  . febuxostat  40 mg Oral Daily  . latanoprost  1 drop Both Eyes QHS  . memantine  28 mg Oral Daily  . metronidazole  500 mg Intravenous Q8H  . pantoprazole  40 mg Oral Daily  . pregabalin  75 mg Oral BID  . sodium chloride flush  3 mL Intravenous Q12H  . vitamin C  500 mg Oral BID   Continuous Infusions:    LOS: 1 day    Time spent: 40min    Domenic Polite, MD [facility] Pager [phone]  If 7PM-7AM, please contact night-coverage [email or web address] Password [email or web address] 07/10/2016, 10:45 AM

## 2016-07-11 ENCOUNTER — Inpatient Hospital Stay (HOSPITAL_COMMUNITY): Payer: Medicare Other

## 2016-07-11 DIAGNOSIS — G309 Alzheimer's disease, unspecified: Secondary | ICD-10-CM

## 2016-07-11 DIAGNOSIS — F0280 Dementia in other diseases classified elsewhere without behavioral disturbance: Secondary | ICD-10-CM

## 2016-07-11 DIAGNOSIS — R1310 Dysphagia, unspecified: Secondary | ICD-10-CM

## 2016-07-11 LAB — BASIC METABOLIC PANEL
ANION GAP: 5 (ref 5–15)
BUN: 10 mg/dL (ref 6–20)
CHLORIDE: 111 mmol/L (ref 101–111)
CO2: 24 mmol/L (ref 22–32)
Calcium: 8.6 mg/dL — ABNORMAL LOW (ref 8.9–10.3)
Creatinine, Ser: 0.97 mg/dL (ref 0.44–1.00)
GFR, EST AFRICAN AMERICAN: 59 mL/min — AB (ref >60)
GFR, EST NON AFRICAN AMERICAN: 51 mL/min — AB (ref >60)
Glucose, Bld: 83 mg/dL (ref 65–99)
POTASSIUM: 4.3 mmol/L (ref 3.5–5.1)
SODIUM: 140 mmol/L (ref 135–145)

## 2016-07-11 LAB — CBC
HEMATOCRIT: 29.6 % — AB (ref 36.0–46.0)
HEMOGLOBIN: 9.7 g/dL — AB (ref 12.0–15.0)
MCH: 32.4 pg (ref 26.0–34.0)
MCHC: 32.8 g/dL (ref 30.0–36.0)
MCV: 99 fL (ref 78.0–100.0)
Platelets: 132 10*3/uL — ABNORMAL LOW (ref 150–400)
RBC: 2.99 MIL/uL — AB (ref 3.87–5.11)
RDW: 14.9 % (ref 11.5–15.5)
WBC: 5.5 10*3/uL (ref 4.0–10.5)

## 2016-07-11 LAB — URINE CULTURE

## 2016-07-11 MED ORDER — CEFPODOXIME PROXETIL 200 MG PO TABS
200.0000 mg | ORAL_TABLET | Freq: Two times a day (BID) | ORAL | Status: DC
  Administered 2016-07-11 – 2016-07-13 (×4): 200 mg via ORAL
  Filled 2016-07-11 (×6): qty 1

## 2016-07-11 NOTE — Progress Notes (Signed)
Attempted to see pt today but pt out of room at test.  Will attempt back as schedule allows. Jinger Neighbors, [place] [id number]

## 2016-07-11 NOTE — Progress Notes (Addendum)
PROGRESS NOTE    Shari Mcmahon  [id number] DOB: Dec 05, 1926 DOA: 07/07/2016 PCP: Cathlean Cower, MD Brief Narrative: 82 y.o.femalewith PMH of dementia, severe protein calorie malnutrition, dysphagia, recurrent UTIs, type 2 diabetes and peripheral vascular disease (not a candidate for revascularization), who was brought in by EMS for syncope. According to the son, she hasnt walked in over a year and just started trying to walk in last few days, her daughter helped her to the bathroom and then fell back and lost consciousness, apparently quickly regained consciousness but was briefly confused there after, then back to normal on route to ER  Assessment & Plan:   1. Syncope -Dehydration/infection mediated vs ? Vasovagal, vs Seizure  -Orthostatics couldn't be checked as pt was too unsteady, according to son just started walking few this week ago after being bed bound for 1year -MRI-unremarkable, EEG recommended per Neuro will FU -WBC up to 20K, CXR with RLL consolidation-started Abx and UA abnormal too -Pt eval completed -ECHO with normal EF  2. Aspiration pneumonia -on Ceftriaxone and Flagyl Day 3, has PCN allergy, also has Ecoli UTI, change to PO Vantin today -long standing dysphagia, h/o gastric cancer 2010; resected. EGD 07/2014 per Dr.Jacobs which showed a mildly erythematous Billroth II anastomosis but widely patent -s/p SLP eval, Esophagram ordered will FU-options limited unless she has a stricture, Seen by Palliative care regarding same issues in March -continue current diet with aspiration precuations -re-ordered esophagram again today  3. Dysphagia -chronic, see discussion in #2  4. 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 Essential hypertension -BP soft but normal, hold norvasc  5.  CONSTIPATION, CHRONIC -continue MiraLAX as  needed  6.  Chronic pain syndrome -complaining of bilateral leg pain, history of peripheral vascular disease - no s/s of infection, monitor -PVD not candidate for re vasc per [facility] -could not tolerate neurontin due to lethargy, started on Low dose lyrica per Neuro, tolerating this  7.  Alzheimer's dementia-continue Namenda and Aricept  8.  Diabetes mellitus, type 2 (HCC) -HbA1c is 4.7, does not need meds, monitor  9. Gout-continue colchicine and Uloric   DVT prophylaxis:Lovenox Code Status: DNR per admission note and prior documentation  Family Communication:son at bedside 9/8, called and d/w daughter Hulda Marin 9/9 and left msh 9/10 Disposition Plan:Home later today or tomorrow   Consultants:  Neurology  Subjective: Leg pains better, no dyspnea, swallowing ok, some weakness  Objective: Vitals:   07/10/16 2100 07/11/16 0109 07/11/16 0533 07/11/16 0909  BP: (!) 139/56 (!) 146/59 (!) 170/49 (!) 156/82  Pulse: (!) 51 (!) 46 (!) 51 (!) 48  Resp: 16 16 16 18   Temp: 98.6 F (37 C) 98 F (36.7 C) 97.9 F (36.6 C) 97.6 F (36.4 C)  TempSrc: Oral Oral Oral Axillary  SpO2: 98% 93% 99% 96%    Intake/Output Summary (Last 24 hours) at 07/11/16 1118 Last data filed at 07/11/16 0604  Gross per 24 hour  Intake              120 ml  Output                0 ml  Net              120 ml   There were no vitals filed for this visit.  Examination:  General exam: Appears calm and comfortable, AAOx2  Respiratory system: decreased BS at bases Cardiovascular system: S1 & S2 heard, RRR. No JVD, murmurs, rubs, gallops or clicks. No pedal edema. Gastrointestinal system: Abdomen is nondistended, soft and nontender. No organomegaly or masses felt. Normal bowel sounds heard. Central nervous system: Alert and oriented. No focal neurological deficits. Extremities: both legs weak Skin: No rashes, lesions or ulcers Psychiatry: Mood & affect appropriate.     Data Reviewed: I have personally  reviewed following labs and imaging studies  CBC:  Recent Labs Lab 07/07/16 1027 07/07/16 1034 07/08/16 0203 07/08/16 0942 07/09/16 0522 07/11/16 0513  WBC 7.1  --  20.3* 19.7* 13.5* 5.5  NEUTROABS 5.1  --   --   --   --   --   HGB 12.2 13.9 9.8* 10.1* 8.7* 9.7*  HCT 36.7 41.0 29.9* 30.4* 25.8* 29.6*  MCV 100.0  --  100.3* 98.7 98.9 99.0  PLT 148*  --  113* 126* 99* [id number]*   Basic Metabolic Panel:  Recent Labs Lab 07/07/16 1027 07/07/16 1034 07/07/16 1431 07/08/16 0203 07/09/16 0522 07/11/16 0513  NA 142 143  --  139 139 140  K 3.5 3.5  --  3.4* 3.4* 4.3  CL 110 106  --  109 111 111  CO2 23  --   --  23 25 24   GLUCOSE 101* 97  --  109* 75 83  BUN 9 10  --  12 11 10   CREATININE 0.89 0.80  --  0.90 0.89 0.97  CALCIUM 9.4  --   --  8.5* 8.5* 8.6*  MG  --   --  1.6*  --   --   --    GFR: CrCl cannot be calculated (Unknown ideal weight.). Liver Function Tests:  Recent Labs Lab 07/07/16 1027 07/07/16 1431 07/08/16 0203  AST 27 22 19   ALT 11* 11* 9*  ALKPHOS 99 93 73  BILITOT 0.6 0.4 0.5  PROT 6.5 6.3* 4.9*  ALBUMIN 3.1* 3.0* 2.3*   No results for input(s): LIPASE, AMYLASE in the last 168 hours. No results for input(s): AMMONIA in the last 168 hours. Coagulation Profile:  Recent Labs Lab 07/07/16 1027  INR 1.07   Cardiac Enzymes:  Recent Labs Lab 07/07/16 1431 07/07/16 2008 07/08/16 0203  TROPONINI 0.03* 0.06* 0.06*   BNP (last 3 results) No results for input(s): PROBNP in the last 8760 hours. HbA1C: No results for input(s): HGBA1C in the last 72 hours. CBG: No results for input(s): GLUCAP in the last 168 hours. Lipid Profile: No results for input(s): CHOL, HDL, LDLCALC, TRIG, CHOLHDL, LDLDIRECT in the last 72 hours. Thyroid Function Tests: No results for input(s): TSH, T4TOTAL, FREET4, T3FREE, THYROIDAB in the last 72 hours. Anemia Panel:  Recent Labs  07/08/16 1923  VITAMINB12 1,232*   Urine analysis:    Component Value Date/Time    COLORURINE YELLOW 07/08/2016 1902   APPEARANCEUR TURBID (A) 07/08/2016 1902   LABSPEC 1.021 07/08/2016 1902   PHURINE 5.5 07/08/2016 1902   GLUCOSEU NEGATIVE 07/08/2016 1902   GLUCOSEU NEGATIVE 11/19/2013 0952   HGBUR TRACE (A) 07/08/2016 1902   BILIRUBINUR NEGATIVE 07/08/2016 1902   BILIRUBINUR negative 08/13/2014 1207   [place] 07/08/2016 1902   PROTEINUR NEGATIVE 07/08/2016 1902   UROBILINOGEN 1.0 06/16/2015 1748   NITRITE NEGATIVE 07/08/2016 1902   LEUKOCYTESUR LARGE (A) 07/08/2016 1902   Sepsis Labs: @LABRCNTIP (procalcitonin:4,lacticidven:4)  ) Recent Results (from the past 240 hour(s))  Culture, Urine     Status: Abnormal   Collection Time:  07/08/16  7:02 PM  Result Value Ref Range Status   Specimen Description URINE, RANDOM  Final   Special Requests NONE  Final   Culture >=100,000 COLONIES/mL ESCHERICHIA COLI (A)  Final   Report Status 07/11/2016 FINAL  Final   Organism ID, Bacteria ESCHERICHIA COLI (A)  Final      Susceptibility   Escherichia coli - MIC*    AMPICILLIN >=32 RESISTANT Resistant     CEFAZOLIN <=4 SENSITIVE Sensitive     CEFTRIAXONE <=1 SENSITIVE Sensitive     CIPROFLOXACIN >=4 RESISTANT Resistant     GENTAMICIN <=1 SENSITIVE Sensitive     IMIPENEM <=0.25 SENSITIVE Sensitive     NITROFURANTOIN <=16 SENSITIVE Sensitive     TRIMETH/SULFA >=320 RESISTANT Resistant     AMPICILLIN/SULBACTAM >=32 RESISTANT Resistant     PIP/TAZO 8 SENSITIVE Sensitive     Extended ESBL NEGATIVE Sensitive     * >=100,000 COLONIES/mL ESCHERICHIA COLI         Radiology Studies: No results found.      Scheduled Meds: . aspirin EC  81 mg Oral Daily  . cefTRIAXone (ROCEPHIN)  IV  1 g Intravenous Q24H  . colchicine  0.6 mg Oral Daily  . docusate sodium  100 mg Oral BID  . donepezil  10 mg Oral QHS  . enoxaparin (LOVENOX) injection  30 mg Subcutaneous Q24H  . febuxostat  40 mg Oral Daily  . latanoprost  1 drop Both Eyes QHS  . memantine  28 mg Oral  Daily  . metronidazole  500 mg Intravenous Q8H  . pantoprazole  40 mg Oral Daily  . pregabalin  75 mg Oral BID  . sodium chloride flush  3 mL Intravenous Q12H  . vitamin C  500 mg Oral BID   Continuous Infusions:    LOS: 2 days    Time spent: 47min    Domenic Polite, MD [facility] Pager [phone]  If 7PM-7AM, please contact night-coverage [email or web address] Password [email or web address] 07/11/2016, 11:18 AM

## 2016-07-11 NOTE — Consult Note (Signed)
[name] Consult: 1:58 PM 07/11/2016  LOS: 2 days    Referring Provider: Dr Broadus John  Primary Care Physician:  Cathlean Cower, MD Primary Gastroenterologist:  Dr. Ardis Hughs.      Reason for Consultation:  Dysphagia, ? Esophageal stricture.    HPI: Shari Mcmahon is a 82 y.o. female.  Hx Dementia, chronic pain syndrome, malnutrition, recurrent UTIs.  Gastric cancer, s/p Bilroth2 and chemoradiation 2005.   Chronic, normocytic anemia.    12/2014 Colonoscopy for weight loss.  2 6-9 MM polyps removed from ascending and transverse colon.  07/2014 EGD for weight loss, dysphagia, s/p Billroth 2 for gastric cancer.  Impression: The Bilroth II anastomosis was mildy erythematous, but widely patent.  Biopsies taken from anastomosis and from gastric remnant. The remnant stomach was very friable, the biopsy sites bled more than usual and retroflexion caused a 1.5cm long linear tear in the proximal stomach that oozed blood and appeared to involve deep mucosal, submucosal layers.  I repaired the defect with placment of 3 endoclips in good position.  There was some mild tortuousity of the distal esophagus, no masses or mucosal abnormalities 12/2012 EGD:  For: previous partial gastrectomy for stomach cancer.  The patient now has delayed gastric emptying problems ENDOSCOPIC IMPRESSION: 1.   Scope easily passed through the esophagus into the stomach. There is a partial gastrectomy with a Billroth II gastroenterostomy which is widely patent and not ulcerated.  Both limbs of the gastroenterostomy are patent and normal.  There is a fairly generous gastric remnant noted with atrophic gastritis and the remaining mucosa.  Multiple biopsies were obtained of the gastric mucosa as were pictures for documentation.  The endoscope was  then withdrawn through the esophagus which appeared normal.  The patient tolerated this procedure well. 2.R/o intestinal metaplasia 3.Post vagotomy gastroparesis 4.No evidence of recurrent cancer 12/2009 EGD with biopsy, [place] Dilation of Esophagus INDICATIONS:  abdominal pain, early satiety, dysphagia H. OF PARTIAL GASTRECTOMY FOR CARCINOMA.     1) S/p partial gastrectomy     2) Normal esophagus     ?? OCCULT DTRICTURE VS. GASTROPARESIS.R/O CHOLIATHIASIS.  Admitted 5 days ago with syncope, aspiration pna,  Ecoli UTI,  ongoing severe protein malnutrition Pt relays story (confirmed by dtr Willie May) of choking on a piece of chicken about 1 month ago, her dtr was able to dislodge this with the use of Heimlich maneuver.  Pt says since then, and even before that she had been very careful to chew well and eat very slowly, with this technique is normally able to prevent choking.  Does not describe chest pain or heartburn.  Wears upper denture but all molars missing on lower jaw though she does not endorse problems with mastication.  No nausea.  Takes Prilosec 40 mg daily.   07/08/16 SLP bedside swallow eval: acute on chronic esophageal dysphagia. Rec for regular diet and thin liquids.   07/11/16 esophagram: progressive distal esophageal narrowing c/w 2015, ? Esophageal stricture.  Also superimposed presbyesophagus with tertiary contractions.  Patent gastrojejunostomy.   Past Medical History:  Diagnosis  Date  . ABDOMINAL PAIN, CHRONIC 04/07/2008  . Aneurysm of thoracic aorta ([place])    "found 03/2015; not big enough to repair" (07/07/2016)  . ARTHRITIS 03/03/2008  . Arthritis    "shoulders, knees, hands" (07/07/2016)  . Arthritis of knee, degenerative 10/19/2011  . Aspiration pneumonia ([place])   . BRADYCARDIA, CHRONIC 12/17/2008  . CARDIAC MURMUR 02/01/2010  . Complication of anesthesia    "she retains carbon dioxide; sleeps too long" (07/07/2016)  . CONSTIPATION, CHRONIC 10/15/2010  . DEGENERATIVE JOINT  DISEASE, KNEES, BILATERAL 12/07/2007  . DEPRESSION 06/17/2009  . DIABETES MELLITUS, TYPE II 09/19/2007   DIET CONTROL   . DVT (deep venous thrombosis) ([facility]) BLE  . DYSPHAGIA UNSPECIFIED 12/24/2009  . Gastroparesis 12/24/2009  . GENERALIZED OSTEOARTHROSIS INVOLVING HAND 10/03/2007  . GERD (gastroesophageal reflux disease)   . GLAUCOMA 09/19/2007  . GOUT 09/19/2007  . GOUTY ARTHROPATHY UNSPECIFIED 02/17/2009  . Hepatitis   . HYPERLIPIDEMIA 01/27/2010  . HYPERTENSION 09/19/2007  . LUNG NODULE 03/03/2008  . MENOPAUSAL DISORDER 12/17/2008  . PERIPHERAL NEUROPATHY 06/19/2008  . PERSONAL HISTORY MALIGNANT NEOPLASM STOMACH 09/19/2007  . 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 STOMACH CANCER 03/03/2008  . UTI 12/15/2009    Past Surgical History:  Procedure Laterality Date  . BILROTH II PROCEDURE    . CATARACT EXTRACTION W/ INTRAOCULAR LENS  IMPLANT, BILATERAL    . COLONOSCOPY    . FRACTURE SURGERY    . HIP FRACTURE SURGERY Right 03/2015    Prior to Admission medications   Medication Sig Start Date End Date Taking? Authorizing Provider  acetaminophen (TYLENOL) 500 MG tablet Take 1 tablet (500 mg total) by mouth every 6 (six) hours as needed. Patient taking differently: Take 1,000 mg by mouth 2 (two) times daily.  05/11/16  Yes Shawn C Joy, PA-C  amLODipine (NORVASC) 5 MG tablet Take 1 tablet (5 mg total) by mouth daily. 02/26/14 02/09/17 Yes Biagio Borg, MD  colchicine 0.6 MG tablet Take 1 tablet (0.6 mg total) by mouth daily as needed (Gout pain.). Patient taking differently: Take 0.6 mg by mouth daily.  02/05/16  Yes Biagio Borg, MD  donepezil (ARICEPT) 10 MG tablet Take 1 tablet (10 mg total) by mouth at bedtime. Reported on 02/05/2016 02/05/16  Yes Biagio Borg, MD  febuxostat (ULORIC) 40 MG tablet Take 1 tablet (40 mg total) by mouth daily. Reported on 02/05/2016 02/05/16  Yes Biagio Borg, MD  lactose free nutrition (BOOST PLUS) LIQD Take 237 mLs by mouth 3 (three) times daily  between meals. Patient taking differently: Take 237 mLs by mouth 2 (two) times daily as needed (between meals for appetite).  01/29/16  Yes Modena Jansky, MD  latanoprost (XALATAN) 0.005 % ophthalmic solution PLACE 1 DROP INTO BOTH EYES AT BEDTIME. 05/25/16  Yes Biagio Borg, MD  memantine (NAMENDA XR) 28 MG CP24 24 hr capsule Take 1 capsule (28 mg total) by mouth daily. Reported on 02/05/2016 02/05/16  Yes Biagio Borg, MD  Multiple Vitamins-Minerals (MULTIVITAMIN WITH MINERALS) tablet Take 1 tablet by mouth daily.   Yes Historical Provider, MD  omeprazole (PRILOSEC) 40 MG capsule Take 1 capsule (40 mg total) by mouth daily. 02/05/16  Yes Biagio Borg, MD  senna (SENOKOT) 8.6 MG tablet Take 1 tablet by mouth 2 (two) times daily as needed for constipation. Reported on 02/05/2016   Yes Historical Provider, MD  vitamin C (ASCORBIC ACID) 500 MG tablet Take 500 mg by mouth 2 (two)  times daily. Reported on 05/11/2016   Yes Historical Provider, MD  Amino Acids-Protein Hydrolys (FEEDING SUPPLEMENT, PRO-STAT SUGAR FREE 64,) LIQD Take 30 mLs by mouth 3 (three) times daily with meals. Patient not taking: Reported on 05/24/2016 11/15/15   Gerlene Fee, NP  feeding supplement, ENSURE ENLIVE, (ENSURE ENLIVE) LIQD Take 237 mLs by mouth 2 (two) times daily between meals. Patient not taking: Reported on 05/24/2016 01/29/16   Modena Jansky, MD    Scheduled Meds: . aspirin EC  81 mg Oral Daily  . cefpodoxime  200 mg Oral Q12H  . colchicine  0.6 mg Oral Daily  . docusate sodium  100 mg Oral BID  . donepezil  10 mg Oral QHS  . enoxaparin (LOVENOX) injection  30 mg Subcutaneous Q24H  . febuxostat  40 mg Oral Daily  . latanoprost  1 drop Both Eyes QHS  . memantine  28 mg Oral Daily  . pantoprazole  40 mg Oral Daily  . pregabalin  75 mg Oral BID  . sodium chloride flush  3 mL Intravenous Q12H  . vitamin C  500 mg Oral BID   Infusions:   PRN Meds: acetaminophen **OR** acetaminophen, levalbuterol, ondansetron  **OR** ondansetron (ZOFRAN) IV, polyethylene glycol, senna   Allergies as of 07/07/2016 - Review Complete 07/07/2016  Allergen Reaction Noted  . Penicillins Shortness Of Breath and Swelling 06/19/2008  . Voltaren [diclofenac sodium] Other (See Comments) 08/15/2014  . Dulcolax [bisacodyl]    . Duloxetine hcl Other (See Comments) 12/22/2014  . Timolol  12/17/2008    Family History  Problem Relation Age of Onset  . Uterine cancer Mother   . Stroke Father     Hemorrhagic  . Cancer Sister     breast cancer and one sister with uterine cancer and one sister with ovarian cancerr  . Colon cancer Maternal Grandmother   . Colon cancer Paternal Grandfather   . Diabetes Other   . Prostate cancer Brother   . Stomach cancer Neg Hx     Social History   Social History  . Marital status: Widowed    Spouse name: N/A  . Number of children: 6  . Years of education: 8   Occupational History  . retired Retired   Social History Main Topics  . Smoking status: Former Smoker    Packs/day: 0.50    Years: 25.00    Types: Cigarettes  . Smokeless tobacco: Never Used     Comment: "stopped when she was 82 years old"  . Alcohol use 0.0 oz/week     Comment: 07/07/2016 "nothing in years; stopped maybe in the  1980s"  . Drug use: No  . Sexual activity: Not on file   Other Topics Concern  . Not on file   Social History Narrative   Lives with her daughter    REVIEW OF SYSTEMS: Constitutional:  Stable weakness, wheelchair dependent can only pivot with assist but not walk ENT:  No nose bleeds Pulm:  No cough or dyspnea CV:  No palpitations, no LE edema.  GU:  No hematuria, no frequency GI:  Per HPI.  No blood PR Heme:  No unusual bleeding or bruising.    Transfusions:  In 4/2017x 1 PRBC.    Neuro:  No headaches, no peripheral tingling or numbness Derm:  No itching, no rash or sores.  Endocrine:  No sweats or chills.  No polyuria or dysuria Immunization: reviewed, multiple vaccines are up to  date.  Travel:  None beyond local  counties in last few months.    PHYSICAL EXAM: Vital signs in last 24 hours: Vitals:   07/11/16 0909 07/11/16 1307  BP: (!) 156/82 (!) 146/72  Pulse: (!) 48 (!) 49  Resp: 18 18  Temp: 97.6 F (36.4 C) 98.7 F (37.1 C)   Wt Readings from Last 3 Encounters:  05/11/16 60.8 kg (134 lb)  02/24/16 62.6 kg (138 lb)  01/21/16 62.6 kg (138 lb)    General: pleasant, alert, calm and comfortable Head:  No trauma or assymmetry or swellling  Eyes:  No icterus or pallor Ears:  Slightly HOPH  Nose:  No discharge Mouth:  Dry tongue.  Midline tongue.  Only remaining teeth are lower canines/incisors.  All lower molars and upper teeth gone.  Full upper denture in place Neck:  No mass or JVD Lungs:  Clear bil.  Diminished overall.  No cough or labored breathing Heart: RRR.  No mrg.  Abdomen:  Soft, NT, ND.  No mass, hernia or HSM.  Marland Kitchen   Rectal: deferred   Musc/Skeltl: no joint contracures or swelling.  Moderate kyphosis.  Extremities:  Thin legs and arms.  No peripheral edema  Neurologic:  Oriented to hospital, self, not to year.  Skin:  No rash or open sores Psych:  Pleasant, calm, in good spirits.   Intake/Output from previous day: 09/10 0701 - 09/11 0700 In: 120 [P.O.:120] Out: -  Intake/Output this shift: No intake/output data recorded.  LAB RESULTS:  Recent Labs  07/09/16 0522 07/11/16 0513  WBC 13.5* 5.5  HGB 8.7* 9.7*  HCT 25.8* 29.6*  PLT 99* 132*   BMET Lab Results  Component Value Date   NA 140 07/11/2016   NA 139 07/09/2016   NA 139 07/08/2016   K 4.3 07/11/2016   K 3.4 (L) 07/09/2016   K 3.4 (L) 07/08/2016   CL 111 07/11/2016   CL 111 07/09/2016   CL 109 07/08/2016   CO2 24 07/11/2016   CO2 25 07/09/2016   CO2 23 07/08/2016   GLUCOSE 83 07/11/2016   GLUCOSE 75 07/09/2016   GLUCOSE 109 (H) 07/08/2016   BUN 10 07/11/2016   BUN 11 07/09/2016   BUN 12 07/08/2016   CREATININE 0.97 07/11/2016   CREATININE 0.89 07/09/2016    CREATININE 0.90 07/08/2016   CALCIUM 8.6 (L) 07/11/2016   CALCIUM 8.5 (L) 07/09/2016   CALCIUM 8.5 (L) 07/08/2016   LFT No results for input(s): PROT, ALBUMIN, AST, ALT, ALKPHOS, BILITOT, BILIDIR, IBILI in the last 72 hours. PT/INR Lab Results  Component Value Date   INR 1.07 07/07/2016   Hepatitis Panel No results for input(s): HEPBSAG, HCVAB, HEPAIGM, HEPBIGM in the last 72 hours. C-Diff No components found for: CDIFF Lipase     Component Value Date/Time   LIPASE 11 05/24/2016 0842    Drugs of Abuse  No results found for: LABOPIA, COCAINSCRNUR, LABBENZ, AMPHETMU, THCU, LABBARB   RADIOLOGY STUDIES: Dg Esophagus  Result Date: 07/11/2016 CLINICAL DATA:  82 year old female with malnutrition and dysphagia. Suspicion of esophageal abnormality on speech pathology exam. 2015 esophagram demonstrating mild narrowing of the distal esophagus. Personal history of Billroth II procedure, reportedly for stomach cancer. Initial encounter. EXAM: ESOPHOGRAM/BARIUM SWALLOW TECHNIQUE: Single contrast examination was performed using  thin barium. FLUOROSCOPY TIME:  Fluoroscopy Time:  2 minutes 30 seconds Radiation Exposure Index (if provided by the fluoroscopic device): Number of Acquired Spot Images: 0 COMPARISON:  CT Abdomen and Pelvis 05/24/2016.  Chest CT 10/30/2014. FINDINGS: The patient ingested thin  barium. Early images suggest retained food debris in the esophagus (series 1, image 31). There is an approximately 3 cm segment of progressed narrowing of the distal thoracic esophagus to the level of the gastroesophageal junction. See series 2, image 27, and compare to series 17, image 1 in 2015. This area of narrowing appears fairly smooth, but maintained a beaked appearance throughout much of the exam. Superimposed decreased esophageal motility and tertiary contractions occurred. The contrast which did reach the stomach rapidly emptied into the small bowel via a gastrojejunostomy. Proximal small  bowel loops are in the left upper quadrant. After a 10 minutes delay perhaps about half to 2/3 of the ingested thin barium remained within the esophagus as a column of barium which extended to the thoracic inlet (series 8 and series 9). IMPRESSION: 1. Progressed 3 cm segment of distal esophageal narrowing since the 2015 exam. On correlating today's esophogram appearance with the recent CT Abdomen and Pelvis in July I do note some extrinsic compression on the distal esophagus caused by the descending thoracic aorta, the heart, and IVC. Although that extrinsic compression seems unlikely to fully explain the esophagram appearance now whereby 1/2 to 2/3 of the ingested volume of barium was retained in the thoracic esophagus after a delay of 10 minutes. Favor progressive esophageal stricture. The smooth appearance of the narrowing, as well as the appearance on the recent CT Abdomen and Pelvis do NOT suggest recurrent gastric cancer as the cause of the narrowing. EGD follow-up may be most valuable at this point. 2. Superimposed presbyesophagus with tertiary contractions. 3. Patent gastrojejunostomy. Electronically Signed   By: Genevie Ann M.D.   On: 07/11/2016 13:20    ENDOSCOPIC STUDIES: Per HPI.    IMPRESSION:   *  Dysphagia.  ? Esophageal stricture. Also contributing, and perhaps the larger issue is dysmotility, presbyesophagus.    *  Chronic normocytic anemia  *  Non-critical thrombocytopenia.   *  Dementia,  Pt is appropriate and not confused.    PLAN:     *  Set up for EGD tomorrow at 12:45 with possible esophageal dilatation. Discussed with pt and dtr.  Both are agreeable to proceed.  Waiting to talk with DTR (she was on cell and driving and wants to call back).  Stopped Lovenox to allow for safe procedure.    Azucena Freed  07/11/2016, 1:58 PM Pager: [phone]

## 2016-07-11 NOTE — Progress Notes (Addendum)
Physical Therapy Treatment Patient Details Name: Shari Mcmahon MRN: [id number] DOB: 29-Oct-1927 Today's Date: 07/11/2016    History of Present Illness pt is an 82 y/o female with pmh of Alzheimers dementia, severe protein calorie malnutrition, recurrent UTI's, DM2, PVD with osteo of L calcaneus, admitted after experiencing an episode of unconsciousness/syncope, generalized shaking, confusion and not following commands.  This, along with facial droop and right-sided weakness resolved before reaching ED.  MRI negative.    PT Comments    Patient currently requires mod/max A for mobility. Pain in bilat feet/knees limiting time in standing. Continue to progress as tolerated.    Follow Up Recommendations  Home health PT     Equipment Recommendations   (TBA)    Recommendations for Other Services       Precautions / Restrictions Precautions Precautions: Fall Restrictions Weight Bearing Restrictions: No    Mobility  Bed Mobility Overal bed mobility: Needs Assistance Bed Mobility: Rolling;Sidelying to Sit Rolling: Mod assist Sidelying to sit: Mod assist       General bed mobility comments: cues for sequencing/technique; assist to bring bilat LE to EOB and elevate trunk with HOB elevated and use of rail  Transfers Overall transfer level: Needs assistance Equipment used: Rolling walker (2 wheeled) Transfers: Sit to/from [employer] Transfers Sit to Stand: Max assist;+2 physical assistance Stand pivot transfers: Max assist;+2 physical assistance;From elevated surface       General transfer comment: +2 to power up into standing with bilat feet blocked and assist to pivot feet; pt with posterior lean and with limited bilat knee ROM ; heavy posterior lean in standing  Ambulation/Gait                 Stairs            Wheelchair Mobility    Modified Rankin (Stroke Patients Only)       Balance     Sitting balance-Leahy Scale: Fair; posterior lean at  times; able to correct with cues and UE assist       Standing balance-Leahy Scale: Zero                      Cognition Arousal/Alertness: Awake/alert Behavior During Therapy: [facility] for tasks assessed/performed Overall Cognitive Status: History of cognitive impairments - at baseline                      Exercises      General Comments  BP in supine 171/60, sitting 151/55, and standing 10/62; pt c/o dizziness throughout session      Pertinent Vitals/Pain Pain Assessment: Faces Faces Pain Scale: Hurts little more Pain Location: bilat feet/knees in standing Pain Descriptors / Indicators: Sore;Aching Pain Intervention(s): Limited activity within patient's tolerance;Monitored during session;Repositioned    Home Living                      Prior Function            PT Goals (current goals can now be found in the care plan section) Acute Rehab PT Goals Patient Stated Goal: back home.  PT Goal Formulation: With patient Time For Goal Achievement: 07/22/16 Potential to Achieve Goals: Good Progress towards PT goals: Progressing toward goals    Frequency  Min 3X/week    PT Plan Current plan remains appropriate    Co-evaluation             End of Session Equipment Utilized During Treatment:  Gait belt Activity Tolerance: Patient limited by pain Patient left: with call bell/phone within reach;in chair;with chair alarm set     Time: [id number] PT Time Calculation (min) (ACUTE ONLY): 30 min  Charges:  $Therapeutic Activity: 23-37 mins                    G Codes:     Salina April, PTA Pager: [phone]   07/11/2016, 1:34 PM

## 2016-07-12 ENCOUNTER — Encounter (HOSPITAL_COMMUNITY): Payer: Self-pay | Admitting: Certified Registered"

## 2016-07-12 ENCOUNTER — Encounter (HOSPITAL_COMMUNITY)
Admission: EM | Disposition: A | Discharge status: Home / Self Care | Payer: Self-pay | Source: Home / Self Care | Attending: Internal Medicine

## 2016-07-12 ENCOUNTER — Inpatient Hospital Stay (HOSPITAL_COMMUNITY): Payer: Medicare Other | Admitting: Certified Registered"

## 2016-07-12 DIAGNOSIS — K222 Esophageal obstruction: Secondary | ICD-10-CM

## 2016-07-12 DIAGNOSIS — K3189 Other diseases of stomach and duodenum: Secondary | ICD-10-CM

## 2016-07-12 HISTORY — PX: ESOPHAGOGASTRODUODENOSCOPY: SHX5428

## 2016-07-12 HISTORY — PX: BALLOON DILATION: SHX5330

## 2016-07-12 SURGERY — EGD (ESOPHAGOGASTRODUODENOSCOPY)
Anesthesia: Monitor Anesthesia Care

## 2016-07-12 MED ORDER — PROPOFOL 10 MG/ML IV BOLUS
INTRAVENOUS | Status: DC | PRN
  Administered 2016-07-12: 15 mg via INTRAVENOUS

## 2016-07-12 MED ORDER — PROPOFOL 500 MG/50ML IV EMUL
INTRAVENOUS | Status: DC | PRN
  Administered 2016-07-12: 100 ug/kg/min via INTRAVENOUS

## 2016-07-12 MED ORDER — LACTATED RINGERS IV SOLN
INTRAVENOUS | Status: DC | PRN
  Administered 2016-07-12: 13:00:00 via INTRAVENOUS

## 2016-07-12 MED ORDER — SODIUM CHLORIDE 0.9 % IV SOLN: INTRAVENOUS | Status: DC

## 2016-07-12 NOTE — Anesthesia Preprocedure Evaluation (Addendum)
Anesthesia Evaluation  Patient identified by MRN, date of birth, ID band Patient awake    Reviewed: Allergy & Precautions, NPO status , Patient's Chart, lab work & pertinent test results  Airway Mallampati: I  TM Distance: >3 FB Neck ROM: Full    Dental   Pulmonary former smoker,    Pulmonary exam normal        Cardiovascular hypertension, Normal cardiovascular exam     Neuro/Psych    GI/Hepatic GERD  Medicated and Controlled,  Endo/Other  diabetes  Renal/GU      Musculoskeletal   Abdominal   Peds  Hematology   Anesthesia Other Findings   Reproductive/Obstetrics                            Anesthesia Physical Anesthesia Plan  ASA: III  Anesthesia Plan: MAC   Post-op Pain Management:    Induction: Intravenous  Airway Management Planned: Simple Face Mask  Additional Equipment:   Intra-op Plan:   Post-operative Plan:   Informed Consent: I have reviewed the patients History and Physical, chart, labs and discussed the procedure including the risks, benefits and alternatives for the proposed anesthesia with the patient or authorized representative who has indicated his/her understanding and acceptance.     Plan Discussed with: CRNA and Surgeon  Anesthesia Plan Comments:         Anesthesia Quick Evaluation

## 2016-07-12 NOTE — Progress Notes (Signed)
Just took patient over at 0200 vitals stable no pain will continue to monitor.

## 2016-07-12 NOTE — Progress Notes (Signed)
SLP Cancellation Note  Patient Details Name: Shari Mcmahon MRN: [id number] DOB: [birth year]   Cancelled treatment:        Pt NPO for EGD today at 12:45. Will follow up when able for dysphagia.   [name] 07/12/2016, 9:19 AM   [name].Ed [employer] [phone]

## 2016-07-12 NOTE — Progress Notes (Signed)
Physical Therapy Treatment Note   07/12/16 1139  PT Visit Information  Last PT Received On 07/12/16  Assistance Needed +1 (+2 for gait away from bed)  History of Present Illness pt is an 82 y/o female with pmh of Alzheimers dementia, severe protein calorie malnutrition, recurrent UTI's, DM2, PVD with osteo of L calcaneus, admitted after experiencing an episode of unconsciousness/syncope, generalized shaking, confusion and not following commands.  This, along with facial droop and right-sided weakness resolved before reaching ED.  MRI negative.  Subjective Data  Patient Stated Goal go home  Precautions  Precautions Fall  Restrictions  Weight Bearing Restrictions No  Pain Assessment  Pain Assessment Faces  Faces Pain Scale 4  Pain Location bilat knees/feet  Pain Descriptors / Indicators Aching;Sore  Pain Intervention(s) Limited activity within patient's tolerance;Monitored during session;Repositioned  Cognition  Arousal/Alertness Awake/alert  Behavior During Therapy WFL for tasks assessed/performed  Overall Cognitive Status History of cognitive impairments - at baseline  Bed Mobility  Overal bed mobility Needs Assistance  Bed Mobility Rolling;Sidelying to Sit  Rolling Mod assist  Sidelying to sit Mod assist;HOB elevated  General bed mobility comments cues for sequencing and hand placement; assist at trunk and bilat LE to roll, bring bilat LE to EOB, and elevate trunk into sitting; use of bed rail and increased time  Transfers  Overall transfer level Needs assistance  Equipment used Rolling walker (2 wheeled)  Transfers Sit to/from [employer] Transfers  Sit to Stand Max assist;+2 physical assistance  Stand pivot transfers Max assist;+2 physical assistance;From elevated surface  General transfer comment +2 to power up into standing and maintain balance upon stand due to heavy posterior lean; assist to pivot bilat feet, for balance with facilitation at hips/glutes, and  management of RW  Balance  Sitting balance-Leahy Scale Fair  Standing balance-Leahy Scale Zero  PT - End of Session  Equipment Utilized During Treatment Gait belt  Activity Tolerance Patient limited by pain  Patient left with call bell/phone within reach;in chair;with chair alarm set  Nurse Communication Mobility status  PT - Assessment/Plan  PT Plan Current plan remains appropriate  PT Frequency (ACUTE ONLY) Min 3X/week  Follow Up Recommendations Home health PT  PT equipment (TBA)  PT Goal Progression  Progress towards PT goals Progressing toward goals  Acute Rehab PT Goals  PT Goal Formulation With patient  Time For Goal Achievement 07/22/16  Potential to Achieve Goals Good  PT Time Calculation  PT Start Time (ACUTE ONLY) 1035  PT Stop Time (ACUTE ONLY) 1056  PT Time Calculation (min) (ACUTE ONLY) 21 min  PT G-Codes **NOT FOR INPATIENT CLASS**  Functional Assessment Tool Used clinical judgement  Functional Limitation Mobility: Walking and moving around  Mobility: Walking and Moving Around Current Status [id number]) CK  Mobility: Walking and Moving Around Goal Status [id number]) CJ  PT General Charges  $$ ACUTE PT VISIT 1 Procedure  PT Treatments  $Therapeutic Activity 8-22 mins  Earney Navy, PTA Pager: [phone]

## 2016-07-12 NOTE — Op Note (Signed)
[facility] Patient Name: Shari Mcmahon Procedure Date : 07/12/2016 MRN: [id number] Attending MD: Carlota Raspberry. Havery Moros , MD Date of Birth: 03/09/1927 CSN: [id number] Age: 82 Admit Type: Inpatient Procedure:                Upper GI endoscopy Indications:              Dysphagia, abnormal barium swallow Providers:                Carlota Raspberry. Havery Moros, MD, Jobe Igo, RN,                            Corliss Parish, Technician Referring MD:              Medicines:                Monitored Anesthesia Care Complications:            No immediate complications. Estimated blood loss:                            Minimal. Estimated Blood Loss:     Estimated blood loss was minimal. Procedure:                Pre-Anesthesia Assessment:                           - Prior to the procedure, a History and Physical                            was performed, and patient medications and                            allergies were reviewed. The patient's tolerance of                            previous anesthesia was also reviewed. The risks                            and benefits of the procedure and the sedation                            options and risks were discussed with the patient.                            All questions were answered, and informed consent                            was obtained. Prior Anticoagulants: The patient has                            taken no previous anticoagulant or antiplatelet                            agents. ASA Grade Assessment: III - A patient with  severe systemic disease. After reviewing the risks                            and benefits, the patient was deemed in                            satisfactory condition to undergo the procedure.                           After obtaining informed consent, the endoscope was                            passed under direct vision. Throughout the                            procedure,  the patient's blood pressure, pulse, and                            oxygen saturations were monitored continuously. The                            [id number]) scope was introduced through the                            mouth, and advanced to the second part of duodenum.                            The upper GI endoscopy was accomplished without                            difficulty. The patient tolerated the procedure                            well. Scope In: Scope Out: Findings:      Esophagogastric landmarks were identified: the Z-line was found at 39       cm, the gastroesophageal junction was found at 39 cm and the upper       extent of the gastric folds was found at 39 cm from the incisors.      One suspected stenosis was found 37 cm from the incisors, it appeared       either to be extrinsic or from spasm. Given the atrophic gastric mucosa       and contact bleeding with the endsocopy, a Savory dilator was not used.       A TTS dilator was passed through the scope. Dilation with a 15-16.5-18       mm balloon dilator was performed to at 15, 16.5, and then 18 mm at the       GEJ, at the site of the stricture, and then dragged proximally up the       esophagus. No significant resistance was encountered and no mucosal       wrent was identified in the esophagus after this maneuver      Multiple plaques were found in the middle third of the esophagus,       suggestive of stasis vs. candida. Biopsies were  taken with a cold       forceps for histology to rule out Candida.      Evidence of a patent Billroth II gastrojejunostomy was found. The       gastrojejunal anastomosis was characterized by healthy appearing mucosa.       The efferent limb was examined. The afferent limb was examined.      Diffuse atrophic mucosa was found in the entire examined stomach, and       superficial bleeding occured with any contact with the endoscope. Given       this occurance, I elected to use a  balloon dilator of the esophagus.       Biopsies were taken with a cold forceps for histology to rule out H       pylori. Retroflexed views appeared normal. Impression:               - Esophagogastric landmarks identified.                           - Stenosis of the distal esophagus, correlating to                            barium study. Mucosa was normal in this area,                            appears to be extrinsic compression vs. spasm /                            motility related. Dilated the entire esophagus up                            to 43mm as outlined without mucosal wrent                            appreciated..                           - Multiple plaques in the middle third of the                            esophagus, suspect stasis related changes but                            biopsied to rule out Candida.                           - Patent Billroth II gastrojejunostomy was found,                            characterized by healthy appearing mucosa.                           - Gastric mucosal atrophy. Biopsied. Moderate Sedation:      No moderate sedation, case performed with MAC Recommendation:           - Return patient to hospital ward for ongoing care.                           -  Resume previous diet.                           - Continue present medications.                           - Await pathology results.                           - Monitor course post-dilation                           - Recommend esophageal manometry as outpatient to                            ensure no evidence of achalasia Procedure Code(s):        --- Professional ---                           [phone], Esophagogastroduodenoscopy, flexible,                            transoral; with transendoscopic balloon dilation of                            esophagus (less than 30 mm diameter)                           [id number], Esophagogastroduodenoscopy, flexible,                            transoral;  with biopsy, single or multiple Diagnosis Code(s):        --- Professional ---                           K22.2, Esophageal obstruction                           K22.8, Other specified diseases of esophagus                           Z98.0, Intestinal bypass and anastomosis status                           K31.89, Other diseases of stomach and duodenum                           R13.10, Dysphagia, unspecified CPT copyright 2016 American Medical Association. All rights reserved. The codes documented in this report are preliminary and upon coder review may  be revised to meet current compliance requirements. Remo Lipps P. Armbruster, MD 07/12/2016 1:37:00 PM This report has been signed electronically. Number of Addenda: 0

## 2016-07-12 NOTE — H&P (View-Only) (Signed)
[facility] Consult: 1:58 PM 07/11/2016  LOS: 2 days    Referring Provider: Dr Broadus John  Primary Care Physician:  Cathlean Cower, MD Primary Gastroenterologist:  Dr. Ardis Hughs.      Reason for Consultation:  Dysphagia, ? Esophageal stricture.    HPI: Shari Mcmahon is a 82 y.o. female.  Hx Dementia, chronic pain syndrome, malnutrition, recurrent UTIs.  Gastric cancer, s/p Bilroth2 and chemoradiation 2005.   Chronic, normocytic anemia.    12/2014 Colonoscopy for weight loss.  2 6-9 MM polyps removed from ascending and transverse colon.  07/2014 EGD for weight loss, dysphagia, s/p Billroth 2 for gastric cancer.  Impression: The Bilroth II anastomosis was mildy erythematous, but widely patent.  Biopsies taken from anastomosis and from gastric remnant. The remnant stomach was very friable, the biopsy sites bled more than usual and retroflexion caused a 1.5cm long linear tear in the proximal stomach that oozed blood and appeared to involve deep mucosal, submucosal layers.  I repaired the defect with placment of 3 endoclips in good position.  There was some mild tortuousity of the distal esophagus, no masses or mucosal abnormalities 12/2012 EGD:  For: previous partial gastrectomy for stomach cancer.  The patient now has delayed gastric emptying problems ENDOSCOPIC IMPRESSION: 1.   Scope easily passed through the esophagus into the stomach. There is a partial gastrectomy with a Billroth II gastroenterostomy which is widely patent and not ulcerated.  Both limbs of the gastroenterostomy are patent and normal.  There is a fairly generous gastric remnant noted with atrophic gastritis and the remaining mucosa.  Multiple biopsies were obtained of the gastric mucosa as were pictures for documentation.  The endoscope was  then withdrawn through the esophagus which appeared normal.  The patient tolerated this procedure well. 2.R/o intestinal metaplasia 3.Post vagotomy gastroparesis 4.No evidence of recurrent cancer 12/2009 EGD with biopsy, [place] Dilation of Esophagus INDICATIONS:  abdominal pain, early satiety, dysphagia H. OF PARTIAL GASTRECTOMY FOR CARCINOMA.     1) S/p partial gastrectomy     2) Normal esophagus     ?? OCCULT DTRICTURE VS. GASTROPARESIS.R/O CHOLIATHIASIS.  Admitted 5 days ago with syncope, aspiration pna,  Ecoli UTI,  ongoing severe protein malnutrition Pt relays story (confirmed by dtr Willie May) of choking on a piece of chicken about 1 month ago, her dtr was able to dislodge this with the use of Heimlich maneuver.  Pt says since then, and even before that she had been very careful to chew well and eat very slowly, with this technique is normally able to prevent choking.  Does not describe chest pain or heartburn.  Wears upper denture but all molars missing on lower jaw though she does not endorse problems with mastication.  No nausea.  Takes Prilosec 40 mg daily.   07/08/16 SLP bedside swallow eval: acute on chronic esophageal dysphagia. Rec for regular diet and thin liquids.   07/11/16 esophagram: progressive distal esophageal narrowing c/w 2015, ? Esophageal stricture.  Also superimposed presbyesophagus with tertiary contractions.  Patent gastrojejunostomy.   Past Medical History:  Diagnosis  Date  . ABDOMINAL PAIN, CHRONIC 04/07/2008  . Aneurysm of thoracic aorta ([place])    "found 03/2015; not big enough to repair" (07/07/2016)  . ARTHRITIS 03/03/2008  . Arthritis    "shoulders, knees, hands" (07/07/2016)  . Arthritis of knee, degenerative 10/19/2011  . Aspiration pneumonia ([place])   . BRADYCARDIA, CHRONIC 12/17/2008  . CARDIAC MURMUR 02/01/2010  . Complication of anesthesia    "she retains carbon dioxide; sleeps too long" (07/07/2016)  . CONSTIPATION, CHRONIC 10/15/2010  . DEGENERATIVE JOINT  DISEASE, KNEES, BILATERAL 12/07/2007  . DEPRESSION 06/17/2009  . DIABETES MELLITUS, TYPE II 09/19/2007   DIET CONTROL   . DVT (deep venous thrombosis) ([facility]) BLE  . DYSPHAGIA UNSPECIFIED 12/24/2009  . Gastroparesis 12/24/2009  . GENERALIZED OSTEOARTHROSIS INVOLVING HAND 10/03/2007  . GERD (gastroesophageal reflux disease)   . GLAUCOMA 09/19/2007  . GOUT 09/19/2007  . GOUTY ARTHROPATHY UNSPECIFIED 02/17/2009  . Hepatitis   . HYPERLIPIDEMIA 01/27/2010  . HYPERTENSION 09/19/2007  . LUNG NODULE 03/03/2008  . MENOPAUSAL DISORDER 12/17/2008  . PERIPHERAL NEUROPATHY 06/19/2008  . PERSONAL HISTORY MALIGNANT NEOPLASM STOMACH 09/19/2007  . 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 STOMACH CANCER 03/03/2008  . UTI 12/15/2009    Past Surgical History:  Procedure Laterality Date  . BILROTH II PROCEDURE    . CATARACT EXTRACTION W/ INTRAOCULAR LENS  IMPLANT, BILATERAL    . COLONOSCOPY    . FRACTURE SURGERY    . HIP FRACTURE SURGERY Right 03/2015    Prior to Admission medications   Medication Sig Start Date End Date Taking? Authorizing Provider  acetaminophen (TYLENOL) 500 MG tablet Take 1 tablet (500 mg total) by mouth every 6 (six) hours as needed. Patient taking differently: Take 1,000 mg by mouth 2 (two) times daily.  05/11/16  Yes Shawn C Joy, PA-C  amLODipine (NORVASC) 5 MG tablet Take 1 tablet (5 mg total) by mouth daily. 02/26/14 02/09/17 Yes Biagio Borg, MD  colchicine 0.6 MG tablet Take 1 tablet (0.6 mg total) by mouth daily as needed (Gout pain.). Patient taking differently: Take 0.6 mg by mouth daily.  02/05/16  Yes Biagio Borg, MD  donepezil (ARICEPT) 10 MG tablet Take 1 tablet (10 mg total) by mouth at bedtime. Reported on 02/05/2016 02/05/16  Yes Biagio Borg, MD  febuxostat (ULORIC) 40 MG tablet Take 1 tablet (40 mg total) by mouth daily. Reported on 02/05/2016 02/05/16  Yes Biagio Borg, MD  lactose free nutrition (BOOST PLUS) LIQD Take 237 mLs by mouth 3 (three) times daily  between meals. Patient taking differently: Take 237 mLs by mouth 2 (two) times daily as needed (between meals for appetite).  01/29/16  Yes Modena Jansky, MD  latanoprost (XALATAN) 0.005 % ophthalmic solution PLACE 1 DROP INTO BOTH EYES AT BEDTIME. 05/25/16  Yes Biagio Borg, MD  memantine (NAMENDA XR) 28 MG CP24 24 hr capsule Take 1 capsule (28 mg total) by mouth daily. Reported on 02/05/2016 02/05/16  Yes Biagio Borg, MD  Multiple Vitamins-Minerals (MULTIVITAMIN WITH MINERALS) tablet Take 1 tablet by mouth daily.   Yes Historical Provider, MD  omeprazole (PRILOSEC) 40 MG capsule Take 1 capsule (40 mg total) by mouth daily. 02/05/16  Yes Biagio Borg, MD  senna (SENOKOT) 8.6 MG tablet Take 1 tablet by mouth 2 (two) times daily as needed for constipation. Reported on 02/05/2016   Yes Historical Provider, MD  vitamin C (ASCORBIC ACID) 500 MG tablet Take 500 mg by mouth 2 (two)  times daily. Reported on 05/11/2016   Yes Historical Provider, MD  Amino Acids-Protein Hydrolys (FEEDING SUPPLEMENT, PRO-STAT SUGAR FREE 64,) LIQD Take 30 mLs by mouth 3 (three) times daily with meals. Patient not taking: Reported on 05/24/2016 11/15/15   Gerlene Fee, NP  feeding supplement, ENSURE ENLIVE, (ENSURE ENLIVE) LIQD Take 237 mLs by mouth 2 (two) times daily between meals. Patient not taking: Reported on 05/24/2016 01/29/16   Modena Jansky, MD    Scheduled Meds: . aspirin EC  81 mg Oral Daily  . cefpodoxime  200 mg Oral Q12H  . colchicine  0.6 mg Oral Daily  . docusate sodium  100 mg Oral BID  . donepezil  10 mg Oral QHS  . enoxaparin (LOVENOX) injection  30 mg Subcutaneous Q24H  . febuxostat  40 mg Oral Daily  . latanoprost  1 drop Both Eyes QHS  . memantine  28 mg Oral Daily  . pantoprazole  40 mg Oral Daily  . pregabalin  75 mg Oral BID  . sodium chloride flush  3 mL Intravenous Q12H  . vitamin C  500 mg Oral BID   Infusions:   PRN Meds: acetaminophen **OR** acetaminophen, levalbuterol, ondansetron  **OR** ondansetron (ZOFRAN) IV, polyethylene glycol, senna   Allergies as of 07/07/2016 - Review Complete 07/07/2016  Allergen Reaction Noted  . Penicillins Shortness Of Breath and Swelling 06/19/2008  . Voltaren [diclofenac sodium] Other (See Comments) 08/15/2014  . Dulcolax [bisacodyl]    . Duloxetine hcl Other (See Comments) 12/22/2014  . Timolol  12/17/2008    Family History  Problem Relation Age of Onset  . Uterine cancer Mother   . Stroke Father     Hemorrhagic  . Cancer Sister     breast cancer and one sister with uterine cancer and one sister with ovarian cancerr  . Colon cancer Maternal Grandmother   . Colon cancer Paternal Grandfather   . Diabetes Other   . Prostate cancer Brother   . Stomach cancer Neg Hx     Social History   Social History  . Marital status: Widowed    Spouse name: N/A  . Number of children: 6  . Years of education: 8   Occupational History  . retired Retired   Social History Main Topics  . Smoking status: Former Smoker    Packs/day: 0.50    Years: 25.00    Types: Cigarettes  . Smokeless tobacco: Never Used     Comment: "stopped when she was 82 years old"  . Alcohol use 0.0 oz/week     Comment: 07/07/2016 "nothing in years; stopped maybe in the  1980s"  . Drug use: No  . Sexual activity: Not on file   Other Topics Concern  . Not on file   Social History Narrative   Lives with her daughter    REVIEW OF SYSTEMS: Constitutional:  Stable weakness, wheelchair dependent can only pivot with assist but not walk ENT:  No nose bleeds Pulm:  No cough or dyspnea CV:  No palpitations, no LE edema.  GU:  No hematuria, no frequency GI:  Per HPI.  No blood PR Heme:  No unusual bleeding or bruising.    Transfusions:  In 4/2017x 1 PRBC.    Neuro:  No headaches, no peripheral tingling or numbness Derm:  No itching, no rash or sores.  Endocrine:  No sweats or chills.  No polyuria or dysuria Immunization: reviewed, multiple vaccines are up to  date.  Travel:  None beyond local  counties in last few months.    PHYSICAL EXAM: Vital signs in last 24 hours: Vitals:   07/11/16 0909 07/11/16 1307  BP: (!) 156/82 (!) 146/72  Pulse: (!) 48 (!) 49  Resp: 18 18  Temp: 97.6 F (36.4 C) 98.7 F (37.1 C)   Wt Readings from Last 3 Encounters:  05/11/16 60.8 kg (134 lb)  02/24/16 62.6 kg (138 lb)  01/21/16 62.6 kg (138 lb)    General: pleasant, alert, calm and comfortable Head:  No trauma or assymmetry or swellling  Eyes:  No icterus or pallor Ears:  Slightly HOPH  Nose:  No discharge Mouth:  Dry tongue.  Midline tongue.  Only remaining teeth are lower canines/incisors.  All lower molars and upper teeth gone.  Full upper denture in place Neck:  No mass or JVD Lungs:  Clear bil.  Diminished overall.  No cough or labored breathing Heart: RRR.  No mrg.  Abdomen:  Soft, NT, ND.  No mass, hernia or HSM.  Marland Kitchen   Rectal: deferred   Musc/Skeltl: no joint contracures or swelling.  Moderate kyphosis.  Extremities:  Thin legs and arms.  No peripheral edema  Neurologic:  Oriented to hospital, self, not to year.  Skin:  No rash or open sores Psych:  Pleasant, calm, in good spirits.   Intake/Output from previous day: 09/10 0701 - 09/11 0700 In: 120 [P.O.:120] Out: -  Intake/Output this shift: No intake/output data recorded.  LAB RESULTS:  Recent Labs  07/09/16 0522 07/11/16 0513  WBC 13.5* 5.5  HGB 8.7* 9.7*  HCT 25.8* 29.6*  PLT 99* 132*   BMET Lab Results  Component Value Date   NA 140 07/11/2016   NA 139 07/09/2016   NA 139 07/08/2016   K 4.3 07/11/2016   K 3.4 (L) 07/09/2016   K 3.4 (L) 07/08/2016   CL 111 07/11/2016   CL 111 07/09/2016   CL 109 07/08/2016   CO2 24 07/11/2016   CO2 25 07/09/2016   CO2 23 07/08/2016   GLUCOSE 83 07/11/2016   GLUCOSE 75 07/09/2016   GLUCOSE 109 (H) 07/08/2016   BUN 10 07/11/2016   BUN 11 07/09/2016   BUN 12 07/08/2016   CREATININE 0.97 07/11/2016   CREATININE 0.89 07/09/2016    CREATININE 0.90 07/08/2016   CALCIUM 8.6 (L) 07/11/2016   CALCIUM 8.5 (L) 07/09/2016   CALCIUM 8.5 (L) 07/08/2016   LFT No results for input(s): PROT, ALBUMIN, AST, ALT, ALKPHOS, BILITOT, BILIDIR, IBILI in the last 72 hours. PT/INR Lab Results  Component Value Date   INR 1.07 07/07/2016   Hepatitis Panel No results for input(s): HEPBSAG, HCVAB, HEPAIGM, HEPBIGM in the last 72 hours. C-Diff No components found for: CDIFF Lipase     Component Value Date/Time   LIPASE 11 05/24/2016 0842    Drugs of Abuse  No results found for: LABOPIA, COCAINSCRNUR, LABBENZ, AMPHETMU, THCU, LABBARB   RADIOLOGY STUDIES: Dg Esophagus  Result Date: 07/11/2016 CLINICAL DATA:  82 year old female with malnutrition and dysphagia. Suspicion of esophageal abnormality on speech pathology exam. 2015 esophagram demonstrating mild narrowing of the distal esophagus. Personal history of Billroth II procedure, reportedly for stomach cancer. Initial encounter. EXAM: ESOPHOGRAM/BARIUM SWALLOW TECHNIQUE: Single contrast examination was performed using  thin barium. FLUOROSCOPY TIME:  Fluoroscopy Time:  2 minutes 30 seconds Radiation Exposure Index (if provided by the fluoroscopic device): Number of Acquired Spot Images: 0 COMPARISON:  CT Abdomen and Pelvis 05/24/2016.  Chest CT 10/30/2014. FINDINGS: The patient ingested thin  barium. Early images suggest retained food debris in the esophagus (series 1, image 31). There is an approximately 3 cm segment of progressed narrowing of the distal thoracic esophagus to the level of the gastroesophageal junction. See series 2, image 27, and compare to series 17, image 1 in 2015. This area of narrowing appears fairly smooth, but maintained a beaked appearance throughout much of the exam. Superimposed decreased esophageal motility and tertiary contractions occurred. The contrast which did reach the stomach rapidly emptied into the small bowel via a gastrojejunostomy. Proximal small  bowel loops are in the left upper quadrant. After a 10 minutes delay perhaps about half to 2/3 of the ingested thin barium remained within the esophagus as a column of barium which extended to the thoracic inlet (series 8 and series 9). IMPRESSION: 1. Progressed 3 cm segment of distal esophageal narrowing since the 2015 exam. On correlating today's esophogram appearance with the recent CT Abdomen and Pelvis in July I do note some extrinsic compression on the distal esophagus caused by the descending thoracic aorta, the heart, and IVC. Although that extrinsic compression seems unlikely to fully explain the esophagram appearance now whereby 1/2 to 2/3 of the ingested volume of barium was retained in the thoracic esophagus after a delay of 10 minutes. Favor progressive esophageal stricture. The smooth appearance of the narrowing, as well as the appearance on the recent CT Abdomen and Pelvis do NOT suggest recurrent gastric cancer as the cause of the narrowing. EGD follow-up may be most valuable at this point. 2. Superimposed presbyesophagus with tertiary contractions. 3. Patent gastrojejunostomy. Electronically Signed   By: Genevie Ann M.D.   On: 07/11/2016 13:20    ENDOSCOPIC STUDIES: Per HPI.    IMPRESSION:   *  Dysphagia.  ? Esophageal stricture. Also contributing, and perhaps the larger issue is dysmotility, presbyesophagus.    *  Chronic normocytic anemia  *  Non-critical thrombocytopenia.   *  Dementia,  Pt is appropriate and not confused.    PLAN:     *  Set up for EGD tomorrow at 12:45 with possible esophageal dilatation. Discussed with pt and dtr.  Both are agreeable to proceed.  Waiting to talk with DTR (she was on cell and driving and wants to call back).  Stopped Lovenox to allow for safe procedure.    Azucena Freed  07/11/2016, 1:58 PM Pager: [phone]

## 2016-07-12 NOTE — Transfer of Care (Signed)
Immediate Anesthesia Transfer of Care Note  Patient: Shari Mcmahon  Procedure(s) Performed: Procedure(s): ESOPHAGOGASTRODUODENOSCOPY (EGD) (N/A) BALLOON DILATION (N/A)  Patient Location: Endoscopy Unit  Anesthesia Type:MAC  Level of Consciousness: awake, alert , oriented and patient cooperative  Airway & Oxygen Therapy: Patient Spontanous Breathing and Patient connected to nasal cannula oxygen  Post-op Assessment: Report given to RN and Post -op Vital signs reviewed and stable  Post vital signs: Reviewed and stable  Last Vitals:  Vitals:   07/12/16 1131 07/12/16 1340  BP: (!) 156/44 (!) 151/41  Pulse: (!) 44 (!) 46  Resp: 14 12  Temp: 36.8 C     Last Pain:  Vitals:   07/12/16 1131  TempSrc: Oral  PainSc:          Complications: No apparent anesthesia complications

## 2016-07-12 NOTE — Interval H&P Note (Signed)
History and Physical Interval Note:  07/12/2016 11:37 AM  Shari Mcmahon  has presented today for surgery, with the diagnosis of dysphagia, esophageal stricture.  The various methods of treatment have been discussed with the patient and family. After consideration of risks, benefits and other options for treatment, the patient has consented to  Procedure(s): ESOPHAGOGASTRODUODENOSCOPY (EGD) (N/A) SAVORY DILATION (N/A) as a surgical intervention .  The patient's history has been reviewed, patient examined, no change in status, stable for surgery.  I have reviewed the patient's chart and labs.  Questions were answered to the patient's satisfaction.     Renelda Loma Shari Mcmahon

## 2016-07-12 NOTE — Progress Notes (Signed)
PROGRESS NOTE    DRYSTAL NERSESIAN  [id number] DOB: [birth year] DOA: 07/07/2016 PCP: Cathlean Cower, MD Brief Narrative: 82 y.o.femalewith PMH of dementia, severe protein calorie malnutrition, dysphagia, recurrent UTIs, type 2 diabetes and peripheral vascular disease (not a candidate for revascularization), who was brought in by EMS for syncope. According to the son, she hasnt walked in over a year and just started trying to walk in last few days, her daughter helped her to the bathroom and then fell back and lost consciousness, apparently quickly regained consciousness but was briefly confused there after, then back to normal on route to ER. Then Found to have aspiration pneumonia and UTI, SLP eval completed, Esophagram with possible stricture, GI consulted for EGD  Assessment & Plan:   1. Syncope -Dehydration/infection mediated vs Orthostatic -Orthostatics couldn't be checked as pt was too unsteady, according to son just started walking few this week ago after being bed bound for 1year -MRI-unremarkable, EEG completed -WBC up to 20K the day next of admission, CXR revealed RLL consolidation-started Abx and Urine grew Ecoli too -Pt eval completed-Home health recommended -ECHO with normal EF  2. Aspiration pneumonia -s/p 3days of Ceftriaxone and Flagyl, has PCN allergy, also has Ecoli UTI, changed to PO Vantin yesterday -long standing dysphagia, h/o gastric cancer 2010; resected. EGD 07/2014 per Dr.Jacobs which showed a mildly erythematous Billroth II anastomosis but widely patent -s/p SLP eval, Esophagram ordered will FU-options limited unless she has a stricture, Seen by Palliative care regarding same issues in March -continue current diet with aspiration precuations -Esophagram 9/11 with possible esophageal stricture-GI consulted, plan for EGD today  3. Dysphagia -chronic, see discussion in #2  4. 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 Essential hypertension -BP soft but normal, hold norvasc  5.  CONSTIPATION, CHRONIC -continue MiraLAX as needed  6.  Chronic pain syndrome -chronic bilateral leg pain, history of peripheral vascular disease - no s/s of infection, monitor -PVD not candidate for re vasc per [facility] -symptoms suggestive of neuropathy, could not tolerate neurontin due to lethargy, started on Low dose lyrica per Neuro, symptoms improved  7.  Alzheimer's dementia-continue Namenda and Aricept  8.  Diabetes mellitus, type 2 (HCC) -HbA1c is 4.7, does not need meds, monitor  9. Gout-continue colchicine and Uloric   DVT prophylaxis:Lovenox Code Status: DNR per admission note and prior documentation  Family Communication:son at bedside 9/8, called and d/w daughter Hulda Marin 9/9, 9/10 and 9/11 Disposition Plan:Home later today or tomorrow   Consultants:  Neurology  Subjective: Leg pains better, swallowing ok  Objective: Vitals:   07/12/16 1131 07/12/16 1340 07/12/16 1350 07/12/16 1736  BP: (!) 156/44 (!) 151/41  (!) 153/51  Pulse: (!) 44 (!) 46 (!) 47 (!) 53  Resp: 14 12 11 16   Temp: 98.3 F (36.8 C)   98.1 F (36.7 C)  TempSrc: Oral   Oral  SpO2: 99% 99% 99% 96%  Weight:      Height:        Intake/Output Summary (Last 24 hours) at 07/12/16 2106 Last data filed at 07/12/16 1341  Gross per 24 hour  Intake              300 ml  Output                2 ml  Net  298 ml   Filed Weights   07/11/16 0800  Weight: 60.5 kg (133 lb 6.4 oz)    Examination:  General exam: Appears calm and comfortable, AAOx2 Respiratory system: decreased BS at bases Cardiovascular system: S1 & S2 heard, RRR. No JVD, murmurs, rubs, gallops or clicks. No pedal edema. Gastrointestinal system: Abdomen is nondistended, soft and nontender. No organomegaly or masses felt. Normal bowel sounds heard. Central  nervous system: Alert and oriented. No focal neurological deficits. Extremities: both legs weak Skin: No rashes, lesions or ulcers Psychiatry: Mood & affect appropriate.     Data Reviewed: I have personally reviewed following labs and imaging studies  CBC:  Recent Labs Lab 07/07/16 1027 07/07/16 1034 07/08/16 0203 07/08/16 0942 07/09/16 0522 07/11/16 0513  WBC 7.1  --  20.3* 19.7* 13.5* 5.5  NEUTROABS 5.1  --   --   --   --   --   HGB 12.2 13.9 9.8* 10.1* 8.7* 9.7*  HCT 36.7 41.0 29.9* 30.4* 25.8* 29.6*  MCV 100.0  --  100.3* 98.7 98.9 99.0  PLT 148*  --  113* 126* 99* [id number]*   Basic Metabolic Panel:  Recent Labs Lab 07/07/16 1027 07/07/16 1034 07/07/16 1431 07/08/16 0203 07/09/16 0522 07/11/16 0513  NA 142 143  --  139 139 140  K 3.5 3.5  --  3.4* 3.4* 4.3  CL 110 106  --  109 111 111  CO2 23  --   --  23 25 24   GLUCOSE 101* 97  --  109* 75 83  BUN 9 10  --  12 11 10   CREATININE 0.89 0.80  --  0.90 0.89 0.97  CALCIUM 9.4  --   --  8.5* 8.5* 8.6*  MG  --   --  1.6*  --   --   --    GFR: Estimated Creatinine Clearance: 38.3 mL/min (by C-G formula based on SCr of 0.97 mg/dL). Liver Function Tests:  Recent Labs Lab 07/07/16 1027 07/07/16 1431 07/08/16 0203  AST 27 22 19   ALT 11* 11* 9*  ALKPHOS 99 93 73  BILITOT 0.6 0.4 0.5  PROT 6.5 6.3* 4.9*  ALBUMIN 3.1* 3.0* 2.3*   No results for input(s): LIPASE, AMYLASE in the last 168 hours. No results for input(s): AMMONIA in the last 168 hours. Coagulation Profile:  Recent Labs Lab 07/07/16 1027  INR 1.07   Cardiac Enzymes:  Recent Labs Lab 07/07/16 1431 07/07/16 2008 07/08/16 0203  TROPONINI 0.03* 0.06* 0.06*   BNP (last 3 results) No results for input(s): PROBNP in the last 8760 hours. HbA1C: No results for input(s): HGBA1C in the last 72 hours. CBG: No results for input(s): GLUCAP in the last 168 hours. Lipid Profile: No results for input(s): CHOL, HDL, LDLCALC, TRIG, CHOLHDL, LDLDIRECT in  the last 72 hours. Thyroid Function Tests: No results for input(s): TSH, T4TOTAL, FREET4, T3FREE, THYROIDAB in the last 72 hours. Anemia Panel: No results for input(s): VITAMINB12, FOLATE, FERRITIN, TIBC, IRON, RETICCTPCT in the last 72 hours. Urine analysis:    Component Value Date/Time   COLORURINE YELLOW 07/08/2016 1902   APPEARANCEUR TURBID (A) 07/08/2016 1902   LABSPEC 1.021 07/08/2016 1902   PHURINE 5.5 07/08/2016 1902   GLUCOSEU NEGATIVE 07/08/2016 1902   GLUCOSEU NEGATIVE 11/19/2013 0952   HGBUR TRACE (A) 07/08/2016 1902   BILIRUBINUR NEGATIVE 07/08/2016 1902   BILIRUBINUR negative 08/13/2014 [place] 07/08/2016 1902   PROTEINUR NEGATIVE 07/08/2016 1902   UROBILINOGEN 1.0 06/16/2015  [place] 07/08/2016 1902   LEUKOCYTESUR LARGE (A) 07/08/2016 1902   Sepsis Labs: @LABRCNTIP (procalcitonin:4,lacticidven:4)  ) Recent Results (from the past 240 hour(s))  Culture, Urine     Status: Abnormal   Collection Time: 07/08/16  7:02 PM  Result Value Ref Range Status   Specimen Description URINE, RANDOM  Final   Special Requests NONE  Final   Culture >=100,000 COLONIES/mL ESCHERICHIA COLI (A)  Final   Report Status 07/11/2016 FINAL  Final   Organism ID, Bacteria ESCHERICHIA COLI (A)  Final      Susceptibility   Escherichia coli - MIC*    AMPICILLIN >=32 RESISTANT Resistant     CEFAZOLIN <=4 SENSITIVE Sensitive     CEFTRIAXONE <=1 SENSITIVE Sensitive     CIPROFLOXACIN >=4 RESISTANT Resistant     GENTAMICIN <=1 SENSITIVE Sensitive     IMIPENEM <=0.25 SENSITIVE Sensitive     NITROFURANTOIN <=16 SENSITIVE Sensitive     TRIMETH/SULFA >=320 RESISTANT Resistant     AMPICILLIN/SULBACTAM >=32 RESISTANT Resistant     PIP/TAZO 8 SENSITIVE Sensitive     Extended ESBL NEGATIVE Sensitive     * >=100,000 COLONIES/mL ESCHERICHIA COLI         Radiology Studies: Dg Esophagus  Result Date: 07/11/2016 CLINICAL DATA:  82 year old female with malnutrition  and dysphagia. Suspicion of esophageal abnormality on speech pathology exam. 2015 esophagram demonstrating mild narrowing of the distal esophagus. Personal history of Billroth II procedure, reportedly for stomach cancer. Initial encounter. EXAM: ESOPHOGRAM/BARIUM SWALLOW TECHNIQUE: Single contrast examination was performed using  thin barium. FLUOROSCOPY TIME:  Fluoroscopy Time:  2 minutes 30 seconds Radiation Exposure Index (if provided by the fluoroscopic device): Number of Acquired Spot Images: 0 COMPARISON:  CT Abdomen and Pelvis 05/24/2016.  Chest CT 10/30/2014. FINDINGS: The patient ingested thin barium. Early images suggest retained food debris in the esophagus (series 1, image 31). There is an approximately 3 cm segment of progressed narrowing of the distal thoracic esophagus to the level of the gastroesophageal junction. See series 2, image 27, and compare to series 17, image 1 in 2015. This area of narrowing appears fairly smooth, but maintained a beaked appearance throughout much of the exam. Superimposed decreased esophageal motility and tertiary contractions occurred. The contrast which did reach the stomach rapidly emptied into the small bowel via a gastrojejunostomy. Proximal small bowel loops are in the left upper quadrant. After a 10 minutes delay perhaps about half to 2/3 of the ingested thin barium remained within the esophagus as a column of barium which extended to the thoracic inlet (series 8 and series 9). IMPRESSION: 1. Progressed 3 cm segment of distal esophageal narrowing since the 2015 exam. On correlating today's esophogram appearance with the recent CT Abdomen and Pelvis in July I do note some extrinsic compression on the distal esophagus caused by the descending thoracic aorta, the heart, and IVC. Although that extrinsic compression seems unlikely to fully explain the esophagram appearance now whereby 1/2 to 2/3 of the ingested volume of barium was retained in the thoracic esophagus  after a delay of 10 minutes. Favor progressive esophageal stricture. The smooth appearance of the narrowing, as well as the appearance on the recent CT Abdomen and Pelvis do NOT suggest recurrent gastric cancer as the cause of the narrowing. EGD follow-up may be most valuable at this point. 2. Superimposed presbyesophagus with tertiary contractions. 3. Patent gastrojejunostomy. Electronically Signed   By: Genevie Ann M.D.   On: 07/11/2016 13:20  Scheduled Meds: . aspirin EC  81 mg Oral Daily  . cefpodoxime  200 mg Oral Q12H  . colchicine  0.6 mg Oral Daily  . docusate sodium  100 mg Oral BID  . donepezil  10 mg Oral QHS  . febuxostat  40 mg Oral Daily  . latanoprost  1 drop Both Eyes QHS  . memantine  28 mg Oral Daily  . pantoprazole  40 mg Oral Daily  . pregabalin  75 mg Oral BID  . sodium chloride flush  3 mL Intravenous Q12H  . vitamin C  500 mg Oral BID   Continuous Infusions:    LOS: 3 days    Time spent: 22min    Domenic Polite, MD [facility] Pager [phone]  If 7PM-7AM, please contact night-coverage [email or web address] Password [facility] 07/12/2016, 9:06 PM

## 2016-07-12 NOTE — Progress Notes (Signed)
OT Cancellation Note  Patient Details Name: AJUNI PAGANI MRN: [id number] DOB: 1926/11/20   Cancelled Treatment:    Reason Eval/Treat Not Completed: Patient at procedure or test/ unavailable. Will follow up as time allows.  Binnie Kand M.S., OTR/L Pager: [phone]  07/12/2016, 12:07 PM

## 2016-07-12 NOTE — Care Management Note (Signed)
Case Management Note  Patient Details  Name: Shari Mcmahon MRN: [id number] Date of Birth: 1927/03/30  Subjective/Objective:                    Action/Plan: Recs are for [facility] therapy. CM following for d/c needs.   Expected Discharge Date:                  Expected Discharge Plan:     In-House Referral:     Discharge planning Services     Post Acute Care Choice:    Choice offered to:     DME Arranged:    DME Agency:     HH Arranged:    HH Agency:     Status of Service:  In process, will continue to follow  If discussed at Long Length of Stay Meetings, dates discussed:    Additional Comments:  Pollie Friar, RN 07/12/2016, 3:30 PM

## 2016-07-12 NOTE — Anesthesia Postprocedure Evaluation (Signed)
Anesthesia Post Note  Patient: Shari Mcmahon  Procedure(s) Performed: Procedure(s) (LRB): ESOPHAGOGASTRODUODENOSCOPY (EGD) (N/A) BALLOON DILATION (N/A)  Patient location during evaluation: PACU Anesthesia Type: MAC Level of consciousness: awake and alert Pain management: pain level controlled Vital Signs Assessment: post-procedure vital signs reviewed and stable Respiratory status: spontaneous breathing, nonlabored ventilation, respiratory function stable and patient connected to nasal cannula oxygen Cardiovascular status: stable and blood pressure returned to baseline Anesthetic complications: no    Last Vitals:  Vitals:   07/12/16 1340 07/12/16 1350  BP: (!) 151/41   Pulse: (!) 46 (!) 47  Resp: 12 11  Temp:      Last Pain:  Vitals:   07/12/16 1131  TempSrc: Oral  PainSc:                  [name] DAVID

## 2016-07-13 ENCOUNTER — Telehealth: Payer: Self-pay

## 2016-07-13 DIAGNOSIS — R1310 Dysphagia, unspecified: Secondary | ICD-10-CM

## 2016-07-13 MED ORDER — PREGABALIN 75 MG PO CAPS: 75.0000 mg | ORAL_CAPSULE | Freq: Every evening | ORAL | 0 refills | Status: DC

## 2016-07-13 MED ORDER — CEFPODOXIME PROXETIL 200 MG PO TABS: 200.0000 mg | ORAL_TABLET | Freq: Two times a day (BID) | ORAL | 0 refills | Status: DC

## 2016-07-13 NOTE — Discharge Summary (Signed)
Physician Discharge Summary  Shari Mcmahon [id number] DOB: 17-Apr-1927 DOA: 07/07/2016  PCP: Cathlean Cower, MD  Admit date: 07/07/2016 Discharge date: 07/13/2016  Time spent: 45 minutes  Recommendations for Outpatient Follow-up:  1. PCP Dr.John in 1 week, newly started on Lyrica for Peripheral neuropathy-please monitor for side effects 2. [facility] as outpatient recommended if possible   Discharge Diagnoses:    Syncope   Aspiration pneumonia   Dementia   Esophageal Dysphagia   Essential hypertension   Dysphagia   CONSTIPATION, CHRONIC   Chronic pain syndrome   PVD   Neuropathy   Diabetes mellitus, type 2 (HCC)   Pressure ulcer    Discharge Condition: stable  Diet recommendation: regular diet/low sodium and thin liquids  Filed Weights   07/11/16 0800  Weight: 60.5 kg (133 lb 6.4 oz)    History of present illness:  82 y.o.femalewith PMH of dementia, severe protein calorie malnutrition, dysphagia, recurrent UTIs, type 2 diabetes and peripheral vascular disease (not a candidate for revascularization), who was brought in by EMS for syncope. According to the son, she hasnt walked in over a year and just started trying to walk in last few days, her daughter helped her to the bathroom and then fell back and lost consciousness, apparently quickly regained consciousness  Hospital Course:  1. Syncope -Dehydration/infection mediated vs Orthostatic -Orthostatics couldn't be checked as pt was too unsteady, according to son just started walking few this week ago after being bed bound for 1year -MRI-unremarkable -WBC up to 20K the day next of admission, CXR revealed RLL consolidation-started Abx and Urine grew Ecoli too -Pt eval completed-Home health recommended -ECHO completed -noted normal EF and wall motion  2. Aspiration pneumonia -s/p 3days of Ceftriaxone and Flagyl, has PCN allergy, also has Ecoli UTI, changed to PO Vantin yesterday  3. Chronic  Dysphagia -long standing dysphagia, h/o gastric cancer 2010; resected. EGD 07/2014 per Dr.Jacobs which showed a mildly erythematous Billroth II anastomosis but widely patent -s/p SLP eval, felt to have esophageal dysphagia -Esophagram 9/11 with possible esophageal stricture-GI consulted, EGD with extrinsic compression vs spasm vs motility issues and manometry recommended as outpatient, tolerating regular diet at this time  4. 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 Essential hypertension -BP soft but normal, hold norvasc  5. CONSTIPATION, CHRONIC -continue senokot   6. Chronic pain syndrome -chronic bilateral leg pain, history of peripheral vascular disease - no s/s of infection, monitor -PVD not candidate for re vasc per [facility] -symptoms suggestive of neuropathy, could not tolerate neurontin due to lethargy, started on Low dose lyrica per Neuro, symptoms improved  7. Alzheimer's dementia-continue Namenda and Aricept  8. Diabetes mellitus, type 2 (HCC) -HbA1c is 4.7, does not need meds, monitor  9. Gout-continue colchicine and Uloric  Procedures:  EGD:  Impression:               - Esophagogastric landmarks identified.                           - Stenosis of the distal esophagus, correlating to                            barium study. Mucosa was normal in this area,  appears to be extrinsic compression vs. spasm /                            motility related. Dilated the entire esophagus up                            to 62mm as outlined without mucosal wrent                            appreciated..                           - Multiple plaques in the middle third of the                            esophagus, suspect stasis related changes but                            biopsied to rule out Candida.                            - Patent Billroth II gastrojejunostomy was found,                            characterized by healthy appearing mucosa.                           - Gastric mucosal atrophy. Biopsied. Consultations:  [facility]  Discharge Exam: Vitals:   07/12/16 1736 07/12/16 2209  BP: (!) 153/51 130/64  Pulse: (!) 53 (!) 48  Resp: 16 16  Temp: 98.1 F (36.7 C) 97.7 F (36.5 C)    General: AAOx2 Cardiovascular: S1S2/RRR Respiratory: CTAB  Discharge Instructions    Current Discharge Medication List    START taking these medications   Details  cefpodoxime (VANTIN) 200 MG tablet Take 1 tablet (200 mg total) by mouth every 12 (twelve) hours. For 4 days Qty: 8 tablet, Refills: 0    pregabalin (LYRICA) 75 MG capsule Take 1 capsule (75 mg total) by mouth every evening. Qty: 30 capsule, Refills: 0      CONTINUE these medications which have NOT CHANGED   Details  acetaminophen (TYLENOL) 500 MG tablet Take 1 tablet (500 mg total) by mouth every 6 (six) hours as needed. Qty: 30 tablet, Refills: 0    amLODipine (NORVASC) 5 MG tablet Take 1 tablet (5 mg total) by mouth daily. Qty: 90 tablet, Refills: 3    colchicine 0.6 MG tablet Take 1 tablet (0.6 mg total) by mouth daily as needed (Gout pain.). Qty: 30 tablet, Refills: 11   Associated Diagnoses: Idiopathic gout of right hand, unspecified chronicity    donepezil (ARICEPT) 10 MG tablet Take 1 tablet (10 mg total) by mouth at bedtime. Reported on 02/05/2016 Qty: 90 tablet, Refills: 3    febuxostat (ULORIC) 40 MG tablet Take 1 tablet (40 mg total) by mouth daily. Reported on 02/05/2016 Qty: 90 tablet, Refills: 3    lactose free nutrition (BOOST PLUS) LIQD Take 237 mLs by mouth 3 (three) times daily between meals.    latanoprost (XALATAN) 0.005 % ophthalmic solution PLACE 1 DROP  INTO BOTH EYES AT BEDTIME. Qty: 2.5 mL, Refills: 2    memantine (NAMENDA XR) 28 MG CP24 24 hr capsule Take 1 capsule (28 mg total) by mouth  daily. Reported on 02/05/2016 Qty: 90 capsule, Refills: 3    Multiple Vitamins-Minerals (MULTIVITAMIN WITH MINERALS) tablet Take 1 tablet by mouth daily.    omeprazole (PRILOSEC) 40 MG capsule Take 1 capsule (40 mg total) by mouth daily. Qty: 90 capsule, Refills: 3    senna (SENOKOT) 8.6 MG tablet Take 1 tablet by mouth 2 (two) times daily as needed for constipation. Reported on 02/05/2016    vitamin C (ASCORBIC ACID) 500 MG tablet Take 500 mg by mouth 2 (two) times daily. Reported on 05/11/2016      STOP taking these medications     Amino Acids-Protein Hydrolys (FEEDING SUPPLEMENT, PRO-STAT SUGAR FREE 64,) LIQD        Allergies  Allergen Reactions  . Penicillins Shortness Of Breath and Swelling    Has patient had a PCN reaction causing immediate rash, facial/tongue/throat swelling, SOB or lightheadedness with hypotension: yes Has patient had a PCN reaction causing severe rash involving mucus membranes or skin necrosis: no Has patient had a PCN reaction that required hospitalization: unknown Has patient had a PCN reaction occurring within the last 10 years: no If all of the above answers are "NO", then may proceed with Cephalosporin use.   . Voltaren [Diclofenac Sodium] Other (See Comments)    Stomach irritation  . Dulcolax [Bisacodyl]     Unknown   . Duloxetine Hcl Other (See Comments)    Made patient not want to eat or drink  . Timolol     REACTION: bradycardia worse      The results of significant diagnostics from this hospitalization (including imaging, microbiology, ancillary and laboratory) are listed below for reference.    Significant Diagnostic Studies: Dg Chest 2 View  Result Date: 07/08/2016 CLINICAL DATA:  Leukocytosis EXAM: CHEST  2 VIEW COMPARISON:  05/24/2016 chest radiograph. FINDINGS: Stable cardiomediastinal silhouette with top-normal heart size and aortic atherosclerosis. No pneumothorax. Trace bilateral pleural effusions. There is new patchy consolidation in  the right greater than left lower lobes. No pulmonary edema. IMPRESSION: 1. New patchy consolidation in the right greater than left lower lobes, most suggestive of multifocal pneumonia and/or aspiration pneumonitis. Recommend follow-up PA and lateral post treatment chest radiographs in 4-6 weeks. 2. Trace bilateral pleural effusions. 3. Aortic atherosclerosis. Electronically Signed   By: Ilona Sorrel M.D.   On: 07/08/2016 09:27   Ct Head Wo Contrast  Result Date: 07/07/2016 CLINICAL DATA:  Altered mental status with left facial droop EXAM: CT HEAD WITHOUT CONTRAST TECHNIQUE: Contiguous axial images were obtained from the base of the skull through the vertex without intravenous contrast. COMPARISON:  01/21/2016 FINDINGS: Brain: Diffuse atrophic changes are identified. Scattered areas of decreased attenuation are noted consistent with chronic white matter ischemic change. These are stable from the prior exam. Vascular: No hyperdense vessel or unexpected calcification. Skull: Normal. Negative for fracture or focal lesion. Sinuses/Orbits: No acute finding. IMPRESSION: Chronic atrophic and ischemic changes.  No acute abnormality noted. These results were called by telephone at the time of interpretation on 07/07/2016 at 10:39 am to Dr. Cristobal Goldmann, who verbally acknowledged these results. Electronically Signed   By: Inez Catalina M.D.   On: 07/07/2016 10:40   Mr Brain Wo Contrast  Result Date: 07/07/2016 CLINICAL DATA:  Initial evaluation for acute syncope. EXAM: MRI HEAD WITHOUT CONTRAST TECHNIQUE: Multiplanar, multiecho pulse  sequences of the brain and surrounding structures were obtained without intravenous contrast. COMPARISON:  Prior CT from earlier the same day. FINDINGS: Study mildly degraded by motion artifact. Diffuse prominence of the CSF containing spaces is compatible with generalized age-related cerebral atrophy. Patchy and confluent T2/FLAIR hyperintensity within the periventricular and deep white matter  most consistent with chronic small vessel ischemic disease, mild for age. No abnormal foci of restricted diffusion to suggest acute ischemia. Gray-white matter differentiation maintained. Major intracranial vascular flow voids are preserved. No acute or chronic intracranial hemorrhage. No areas of chronic infarction. No mass lesion, midline shift, or mass effect. No hydrocephalus. No extra-axial fluid collection. Major dural sinuses are grossly patent. Craniocervical junction normal. Mild degenerative spondylolysis noted at C3-4 without significant stenosis. Remainder the visualized upper cervical spine otherwise unremarkable. Incidental note made of an empty sella. No acute abnormality about the globes and orbits. Patient is status post lens extraction bilaterally. Paranasal sinuses are clear. No mastoid effusion. Inner ear structures grossly normal. Bone marrow signal intensity within normal limits. No scalp soft tissue abnormality. IMPRESSION: 1. No acute intracranial process identified. 2. Mild for age chronic microvascular ischemic disease. Electronically Signed   By: Jeannine Boga M.D.   On: 07/07/2016 22:12   Dg Esophagus  Result Date: 07/11/2016 CLINICAL DATA:  82 year old female with malnutrition and dysphagia. Suspicion of esophageal abnormality on speech pathology exam. 2015 esophagram demonstrating mild narrowing of the distal esophagus. Personal history of Billroth II procedure, reportedly for stomach cancer. Initial encounter. EXAM: ESOPHOGRAM/BARIUM SWALLOW TECHNIQUE: Single contrast examination was performed using  thin barium. FLUOROSCOPY TIME:  Fluoroscopy Time:  2 minutes 30 seconds Radiation Exposure Index (if provided by the fluoroscopic device): Number of Acquired Spot Images: 0 COMPARISON:  CT Abdomen and Pelvis 05/24/2016.  Chest CT 10/30/2014. FINDINGS: The patient ingested thin barium. Early images suggest retained food debris in the esophagus (series 1, image 31). There is an  approximately 3 cm segment of progressed narrowing of the distal thoracic esophagus to the level of the gastroesophageal junction. See series 2, image 27, and compare to series 17, image 1 in 2015. This area of narrowing appears fairly smooth, but maintained a beaked appearance throughout much of the exam. Superimposed decreased esophageal motility and tertiary contractions occurred. The contrast which did reach the stomach rapidly emptied into the small bowel via a gastrojejunostomy. Proximal small bowel loops are in the left upper quadrant. After a 10 minutes delay perhaps about half to 2/3 of the ingested thin barium remained within the esophagus as a column of barium which extended to the thoracic inlet (series 8 and series 9). IMPRESSION: 1. Progressed 3 cm segment of distal esophageal narrowing since the 2015 exam. On correlating today's esophogram appearance with the recent CT Abdomen and Pelvis in July I do note some extrinsic compression on the distal esophagus caused by the descending thoracic aorta, the heart, and IVC. Although that extrinsic compression seems unlikely to fully explain the esophagram appearance now whereby 1/2 to 2/3 of the ingested volume of barium was retained in the thoracic esophagus after a delay of 10 minutes. Favor progressive esophageal stricture. The smooth appearance of the narrowing, as well as the appearance on the recent CT Abdomen and Pelvis do NOT suggest recurrent gastric cancer as the cause of the narrowing. EGD follow-up may be most valuable at this point. 2. Superimposed presbyesophagus with tertiary contractions. 3. Patent gastrojejunostomy. Electronically Signed   By: Genevie Ann M.D.   On: 07/11/2016 13:20  Microbiology: Recent Results (from the past 240 hour(s))  Culture, Urine     Status: Abnormal   Collection Time: 07/08/16  7:02 PM  Result Value Ref Range Status   Specimen Description URINE, RANDOM  Final   Special Requests NONE  Final   Culture >=100,000  COLONIES/mL ESCHERICHIA COLI (A)  Final   Report Status 07/11/2016 FINAL  Final   Organism ID, Bacteria ESCHERICHIA COLI (A)  Final      Susceptibility   Escherichia coli - MIC*    AMPICILLIN >=32 RESISTANT Resistant     CEFAZOLIN <=4 SENSITIVE Sensitive     CEFTRIAXONE <=1 SENSITIVE Sensitive     CIPROFLOXACIN >=4 RESISTANT Resistant     GENTAMICIN <=1 SENSITIVE Sensitive     IMIPENEM <=0.25 SENSITIVE Sensitive     NITROFURANTOIN <=16 SENSITIVE Sensitive     TRIMETH/SULFA >=320 RESISTANT Resistant     AMPICILLIN/SULBACTAM >=32 RESISTANT Resistant     PIP/TAZO 8 SENSITIVE Sensitive     Extended ESBL NEGATIVE Sensitive     * >=100,000 COLONIES/mL ESCHERICHIA COLI     Labs: Basic Metabolic Panel:  Recent Labs Lab 07/07/16 1027 07/07/16 1034 07/07/16 1431 07/08/16 0203 07/09/16 0522 07/11/16 0513  NA 142 143  --  139 139 140  K 3.5 3.5  --  3.4* 3.4* 4.3  CL 110 106  --  109 111 111  CO2 23  --   --  23 25 24   GLUCOSE 101* 97  --  109* 75 83  BUN 9 10  --  12 11 10   CREATININE 0.89 0.80  --  0.90 0.89 0.97  CALCIUM 9.4  --   --  8.5* 8.5* 8.6*  MG  --   --  1.6*  --   --   --    Liver Function Tests:  Recent Labs Lab 07/07/16 1027 07/07/16 1431 07/08/16 0203  AST 27 22 19   ALT 11* 11* 9*  ALKPHOS 99 93 73  BILITOT 0.6 0.4 0.5  PROT 6.5 6.3* 4.9*  ALBUMIN 3.1* 3.0* 2.3*   No results for input(s): LIPASE, AMYLASE in the last 168 hours. No results for input(s): AMMONIA in the last 168 hours. CBC:  Recent Labs Lab 07/07/16 1027 07/07/16 1034 07/08/16 0203 07/08/16 0942 07/09/16 0522 07/11/16 0513  WBC 7.1  --  20.3* 19.7* 13.5* 5.5  NEUTROABS 5.1  --   --   --   --   --   HGB 12.2 13.9 9.8* 10.1* 8.7* 9.7*  HCT 36.7 41.0 29.9* 30.4* 25.8* 29.6*  MCV 100.0  --  100.3* 98.7 98.9 99.0  PLT 148*  --  113* 126* 99* 132*   Cardiac Enzymes:  Recent Labs Lab 07/07/16 1431 07/07/16 2008 07/08/16 0203  TROPONINI 0.03* 0.06* 0.06*   BNP: BNP (last 3  results) No results for input(s): BNP in the last 8760 hours.  ProBNP (last 3 results) No results for input(s): PROBNP in the last 8760 hours.  CBG: No results for input(s): GLUCAP in the last 168 hours.     SignedDomenic Polite MD.  [facility] 07/13/2016, 12:09 AM

## 2016-07-13 NOTE — Progress Notes (Signed)
Daily Rounding Note  07/13/2016, 8:28 AM  LOS: 4 days   SUBJECTIVE:   Chief complaint: dysphagia  Some dysphagia this AM, slight choking in region of upper neck after eating a large bite of a plain, untoasted bagel.  I saw the bagel and there was a large, single bite mark evident.    OBJECTIVE:         Vital signs in last 24 hours:    Temp:  [97.3 F (36.3 C)-98.3 F (36.8 C)] 98.2 F (36.8 C) (09/13 0548) Pulse Rate:  [44-53] 53 (09/13 0548) Resp:  [11-16] 16 (09/13 0548) BP: (120-156)/(41-64) 138/54 (09/13 0548) SpO2:  [91 %-99 %] 96 % (09/13 0548) Last BM Date: 07/11/16 Filed Weights   07/11/16 0800  Weight: 60.5 kg (133 lb 6.4 oz)   General: pleasant, frail, not ill looking   Heart: RRR Chest: fine crackles in bases.  No cough or dyspnea.  Abdomen: soft, NT, ND.  BS active.    Extremities: no CCE.  Neuro/Psych:  Pleasant, alert, appropriate.  HOH.  No tremor or gross limb weakness.   Intake/Output from previous day: 09/12 0701 - 09/13 0700 In: 300 [I.V.:300] Out: 2 [Blood:2]  Intake/Output this shift: No intake/output data recorded.  Lab Results:  Recent Labs  07/11/16 0513  WBC 5.5  HGB 9.7*  HCT 29.6*  PLT 132*   BMET  Recent Labs  07/11/16 0513  NA 140  K 4.3  CL 111  CO2 24  GLUCOSE 83  BUN 10  CREATININE 0.97  CALCIUM 8.6*   LFT No results for input(s): PROT, ALBUMIN, AST, ALT, ALKPHOS, BILITOT, BILIDIR, IBILI in the last 72 hours. PT/INR No results for input(s): LABPROT, INR in the last 72 hours. Hepatitis Panel No results for input(s): HEPBSAG, HCVAB, HEPAIGM, HEPBIGM in the last 72 hours.  Studies/Results: Dg Esophagus  Result Date: 07/11/2016 CLINICAL DATA:  82 year old female with malnutrition and dysphagia. Suspicion of esophageal abnormality on speech pathology exam. 2015 esophagram demonstrating mild narrowing of the distal esophagus. Personal history of Billroth II  procedure, reportedly for stomach cancer. Initial encounter. EXAM: ESOPHOGRAM/BARIUM SWALLOW TECHNIQUE: Single contrast examination was performed using  thin barium. FLUOROSCOPY TIME:  Fluoroscopy Time:  2 minutes 30 seconds Radiation Exposure Index (if provided by the fluoroscopic device): Number of Acquired Spot Images: 0 COMPARISON:  CT Abdomen and Pelvis 05/24/2016.  Chest CT 10/30/2014. FINDINGS: The patient ingested thin barium. Early images suggest retained food debris in the esophagus (series 1, image 31). There is an approximately 3 cm segment of progressed narrowing of the distal thoracic esophagus to the level of the gastroesophageal junction. See series 2, image 27, and compare to series 17, image 1 in 2015. This area of narrowing appears fairly smooth, but maintained a beaked appearance throughout much of the exam. Superimposed decreased esophageal motility and tertiary contractions occurred. The contrast which did reach the stomach rapidly emptied into the small bowel via a gastrojejunostomy. Proximal small bowel loops are in the left upper quadrant. After a 10 minutes delay perhaps about half to 2/3 of the ingested thin barium remained within the esophagus as a column of barium which extended to the thoracic inlet (series 8 and series 9). IMPRESSION: 1. Progressed 3 cm segment of distal esophageal narrowing since the 2015 exam. On correlating today's esophogram appearance with the recent CT Abdomen and Pelvis in July I do note some extrinsic compression on the distal esophagus caused by the descending  thoracic aorta, the heart, and IVC. Although that extrinsic compression seems unlikely to fully explain the esophagram appearance now whereby 1/2 to 2/3 of the ingested volume of barium was retained in the thoracic esophagus after a delay of 10 minutes. Favor progressive esophageal stricture. The smooth appearance of the narrowing, as well as the appearance on the recent CT Abdomen and Pelvis do NOT  suggest recurrent gastric cancer as the cause of the narrowing. EGD follow-up may be most valuable at this point. 2. Superimposed presbyesophagus with tertiary contractions. 3. Patent gastrojejunostomy. Electronically Signed   By: Genevie Ann M.D.   On: 07/11/2016 13:20   Scheduled Meds: . aspirin EC  81 mg Oral Daily  . cefpodoxime  200 mg Oral Q12H  . colchicine  0.6 mg Oral Daily  . docusate sodium  100 mg Oral BID  . donepezil  10 mg Oral QHS  . febuxostat  40 mg Oral Daily  . latanoprost  1 drop Both Eyes QHS  . memantine  28 mg Oral Daily  . pantoprazole  40 mg Oral Daily  . pregabalin  75 mg Oral BID  . sodium chloride flush  3 mL Intravenous Q12H  . vitamin C  500 mg Oral BID   Continuous Infusions:  PRN Meds:.acetaminophen **OR** acetaminophen, levalbuterol, ondansetron **OR** ondansetron (ZOFRAN) IV, polyethylene glycol, senna   ASSESMENT:   *  Dysphagia.   9/12 EGD: distal eophageal compression vs spasm, area dilated.  Esophageal plaques biopsied, r/o candida.  Atrophic, friable gastric mucosa.  Patent Billroth 2 gastrojejunostomy.  *  Aspiration PNA, pneumonitis.    *  Chronic normocytic anemia.  *  Non-critical thrombocytopenia.   *  Tubular adenomatous polyp on colonoscopy 12/2014.  Per Dr Ardis Hughs will not need repeat surveillance study given age.   PLAN   *  Await pathology.   Soft diet (Dysphagia 3, thin liquids).  Note she had "regular" Heat diet this AM without texture modifications,  Chew food well: pt aware of this and follows at home, dtr also vigilant about what/how pt is eating.   *  Dr Havery Moros wants pt to have outpt manometry testing, the office will coordinate this.     *  Ok for discharge.    Azucena Freed  07/13/2016, 8:28 AM Pager: [phone]

## 2016-07-13 NOTE — Discharge Instructions (Signed)
Follow with Primary MD Cathlean Cower, MD in 7 days   Get CBC, CMP,  checked  by Primary MD next visit.    Activity: As tolerated with Full fall precautions use walker/cane & assistance as needed   Disposition Home    Diet: Dysphagia 3, soft diet with clear liquids , with feeding assistance and aspiration precautions.  For Heart failure patients - Check your Weight same time everyday, if you gain over 2 pounds, or you develop in leg swelling, experience more shortness of breath or chest pain, call your Primary MD immediately. Follow Cardiac Low Salt Diet and 1.5 lit/day fluid restriction.   On your next visit with your primary care physician please Get Medicines reviewed and adjusted.   Please request your Prim.MD to go over all Hospital Tests and Procedure/Radiological results at the follow up, please get all Hospital records sent to your Prim MD by signing hospital release before you go home.   If you experience worsening of your admission symptoms, develop shortness of breath, life threatening emergency, suicidal or homicidal thoughts you must seek medical attention immediately by calling 911 or calling your MD immediately  if symptoms less severe.  You Must read complete instructions/literature along with all the possible adverse reactions/side effects for all the Medicines you take and that have been prescribed to you. Take any new Medicines after you have completely understood and accpet all the possible adverse reactions/side effects.   Do not drive, operating heavy machinery, perform activities at heights, swimming or participation in water activities or provide baby sitting services if your were admitted for syncope or siezures until you have seen by Primary MD or a Neurologist and advised to do so again.  Do not drive when taking Pain medications.    Do not take more than prescribed Pain, Sleep and Anxiety Medications  Special Instructions: If you have smoked or chewed Tobacco   in the last 2 yrs please stop smoking, stop any regular Alcohol  and or any Recreational drug use.  Wear Seat belts while driving.   Please note  You were cared for by a hospitalist during your hospital stay. If you have any questions about your discharge medications or the care you received while you were in the hospital after you are discharged, you can call the unit and asked to speak with the hospitalist on call if the hospitalist that took care of you is not available. Once you are discharged, your primary care physician will handle any further medical issues. Please note that NO REFILLS for any discharge medications will be authorized once you are discharged, as it is imperative that you return to your primary care physician (or establish a relationship with a primary care physician if you do not have one) for your aftercare needs so that they can reassess your need for medications and monitor your lab values.

## 2016-07-13 NOTE — Progress Notes (Signed)
[facility] arrived to unit to transfer patient home.  IV and tele removed, central tele notified, report given. Patient's belongings sent with patient. Code status during transport will be full code as patient DNR form not on file.  MD paged x2, transport not able to take verbal order. Patient daughter called, not able to bring DNR form from home, informed of situation, DNR paper "on fridge at home" where patient is being transported. Patient informed of situation, in agreement. Charge RN aware of situation.  Patient left unit 2045.

## 2016-07-13 NOTE — Care Management Note (Addendum)
Case Management Note  Patient Details  Name: Shari Mcmahon MRN: [id number] Date of Birth: 1927/10/13  Subjective/Objective:                    Action/Plan: Pt discharging home with [facility] services. CM called and spoke to patients daughter Shari Mcmahon to go over list of [facility] agencies. Shari Mcmahon stated they have used [place] ([facility] in the past and would like to use them again. Mary with [facility] notified and accepted the referral. Shari Mcmahon also requesting the patient be transported home via [place]. CM called [facility] and arranged transportation home. Bedside RN updated.   Expected Discharge Date:                  Expected Discharge Plan:  [place]  In-House Referral:     Discharge planning Services  CM Consult  Post Acute Care Choice:  Home Health Choice offered to:  Adult Children  DME Arranged:    DME Agency:     HH Arranged:  PT HH Agency:  [facility] ([facility])  Status of Service:  Completed, signed off  If discussed at [place] of Stay Meetings, dates discussed:    Additional Comments:  Pollie Friar, RN 07/13/2016, 4:15 PM

## 2016-07-13 NOTE — Discharge Summary (Signed)
Physician Discharge Summary  Shari Mcmahon [id number] DOB: [birth year] DOA: 07/07/2016  PCP: Cathlean Cower, MD  Admit date: 07/07/2016 Discharge date: 07/13/2016  Time spent: 45 minutes  Recommendations for Outpatient Follow-up:  1. PCP Dr.John in 1 week, newly started on Lyrica for Peripheral neuropathy-please monitor for side effects 2. [facility] as outpatient recommended if possible   Discharge Diagnoses:    Syncope   Aspiration pneumonia   Dementia   Esophageal Dysphagia   Essential hypertension   Dysphagia   CONSTIPATION, CHRONIC   Chronic pain syndrome   PVD   Neuropathy   Diabetes mellitus, type 2 (HCC)   Pressure ulcer    Discharge Condition: stable  Diet recommendation: regular diet/low sodium and thin liquids  Filed Weights   07/11/16 0800  Weight: 60.5 kg (133 lb 6.4 oz)    History of present illness:  82 y.o.femalewith PMH of dementia, severe protein calorie malnutrition, dysphagia, recurrent UTIs, type 2 diabetes and peripheral vascular disease (not a candidate for revascularization), who was brought in by EMS for syncope. According to the son, she hasnt walked in over a year and just started trying to walk in last few days, her daughter helped her to the bathroom and then fell back and lost consciousness, apparently quickly regained consciousness  Hospital Course:  1. Syncope -Dehydration/infection mediated vs Orthostatic -Orthostatics couldn't be checked as pt was too unsteady, according to son just started walking few this week ago after being bed bound for 1year -MRI-unremarkable -WBC up to 20K the day next of admission, CXR revealed RLL consolidation-started Abx and Urine grew Ecoli too -Pt eval completed-Home health recommended -ECHO completed -noted normal EF and wall motion  2. Aspiration pneumonia -s/p 3days of Ceftriaxone and Flagyl, has PCN allergy, also has Ecoli UTI, changed to PO Vantin On discharge  3. Chronic  Dysphagia -long standing dysphagia, h/o gastric cancer 2010; resected. EGD 07/2014 per Dr.Jacobs which showed a mildly erythematous Billroth II anastomosis but widely patent -s/p SLP eval, felt to have esophageal dysphagia -Esophagram 9/11 with possible esophageal stricture-GI consulted, EGD with extrinsic compression vs spasm vs motility issues and manometry recommended as outpatient, tolerating regular diet at this time  4. 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 Essential hypertension -BP soft but normal, hold norvasc  5. CONSTIPATION, CHRONIC -continue senokot   6. Chronic pain syndrome -chronic bilateral leg pain, history of peripheral vascular disease - no s/s of infection, monitor -PVD not candidate for re vasc per [facility] -symptoms suggestive of neuropathy, could not tolerate neurontin due to lethargy, started on Low dose lyrica per Neuro, symptoms improved  7. Alzheimer's dementia-continue Namenda and Aricept  8. Diabetes mellitus, type 2 (HCC) -HbA1c is 4.7, does not need meds, monitor  9. Gout-continue colchicine and Uloric  Procedures:  EGD:  Impression:               - Esophagogastric landmarks identified.                           - Stenosis of the distal esophagus, correlating to                            barium study. Mucosa was normal in this area,  appears to be extrinsic compression vs. spasm /                            motility related. Dilated the entire esophagus up                            to 65mm as outlined without mucosal wrent                            appreciated..                           - Multiple plaques in the middle third of the                            esophagus, suspect stasis related changes but                            biopsied to rule out Candida.                            - Patent Billroth II gastrojejunostomy was found,                            characterized by healthy appearing mucosa.                           - Gastric mucosal atrophy. Biopsied. Consultations:  [place]  [facility]  Discharge Exam: Vitals:   07/13/16 0548 07/13/16 0919  BP: (!) 138/54 (!) 123/40  Pulse: (!) 53 74  Resp: 16 17  Temp: 98.2 F (36.8 C) 98.1 F (36.7 C)    General: AAOx2 Cardiovascular: S1S2/RRR Respiratory: CTAB  Discharge Instructions   Discharge Instructions    Discharge instructions    Complete by:  As directed    Follow with Primary MD Cathlean Cower, MD in 7 days   Get CBC, CMP,  checked  by Primary MD next visit.    Activity: As tolerated with Full fall precautions use walker/cane & assistance as needed   Disposition Home    Diet: Dysphagia 3, soft diet with clear liquids , with feeding assistance and aspiration precautions.  For Heart failure patients - Check your Weight same time everyday, if you gain over 2 pounds, or you develop in leg swelling, experience more shortness of breath or chest pain, call your Primary MD immediately. Follow Cardiac Low Salt Diet and 1.5 lit/day fluid restriction.   On your next visit with your primary care physician please Get Medicines reviewed and adjusted.   Please request your Prim.MD to go over all Hospital Tests and Procedure/Radiological results at the follow up, please get all Hospital records sent to your Prim MD by signing hospital release before you go home.   If you experience worsening of your admission symptoms, develop shortness of breath, life threatening emergency, suicidal or homicidal thoughts you must seek medical attention immediately by calling 911 or calling your MD immediately  if symptoms less severe.  You Must read complete instructions/literature along with all the possible adverse reactions/side effects for all the Medicines you take and that have been prescribed  to  you. Take any new Medicines after you have completely understood and accpet all the possible adverse reactions/side effects.   Do not drive, operating heavy machinery, perform activities at heights, swimming or participation in water activities or provide baby sitting services if your were admitted for syncope or siezures until you have seen by Primary MD or a Neurologist and advised to do so again.  Do not drive when taking Pain medications.    Do not take more than prescribed Pain, Sleep and Anxiety Medications  Special Instructions: If you have smoked or chewed Tobacco  in the last 2 yrs please stop smoking, stop any regular Alcohol  and or any Recreational drug use.  Wear Seat belts while driving.   Please note  You were cared for by a hospitalist during your hospital stay. If you have any questions about your discharge medications or the care you received while you were in the hospital after you are discharged, you can call the unit and asked to speak with the hospitalist on call if the hospitalist that took care of you is not available. Once you are discharged, your primary care physician will handle any further medical issues. Please note that NO REFILLS for any discharge medications will be authorized once you are discharged, as it is imperative that you return to your primary care physician (or establish a relationship with a primary care physician if you do not have one) for your aftercare needs so that they can reassess your need for medications and monitor your lab values.   Increase activity slowly    Complete by:  As directed      Current Discharge Medication List    START taking these medications   Details  cefpodoxime (VANTIN) 200 MG tablet Take 1 tablet (200 mg total) by mouth every 12 (twelve) hours. For 4 days Qty: 8 tablet, Refills: 0    pregabalin (LYRICA) 75 MG capsule Take 1 capsule (75 mg total) by mouth every evening. Qty: 30 capsule, Refills: 0      CONTINUE  these medications which have NOT CHANGED   Details  acetaminophen (TYLENOL) 500 MG tablet Take 1 tablet (500 mg total) by mouth every 6 (six) hours as needed. Qty: 30 tablet, Refills: 0    amLODipine (NORVASC) 5 MG tablet Take 1 tablet (5 mg total) by mouth daily. Qty: 90 tablet, Refills: 3    colchicine 0.6 MG tablet Take 1 tablet (0.6 mg total) by mouth daily as needed (Gout pain.). Qty: 30 tablet, Refills: 11   Associated Diagnoses: Idiopathic gout of right hand, unspecified chronicity    donepezil (ARICEPT) 10 MG tablet Take 1 tablet (10 mg total) by mouth at bedtime. Reported on 02/05/2016 Qty: 90 tablet, Refills: 3    febuxostat (ULORIC) 40 MG tablet Take 1 tablet (40 mg total) by mouth daily. Reported on 02/05/2016 Qty: 90 tablet, Refills: 3    lactose free nutrition (BOOST PLUS) LIQD Take 237 mLs by mouth 3 (three) times daily between meals.    latanoprost (XALATAN) 0.005 % ophthalmic solution PLACE 1 DROP INTO BOTH EYES AT BEDTIME. Qty: 2.5 mL, Refills: 2    memantine (NAMENDA XR) 28 MG CP24 24 hr capsule Take 1 capsule (28 mg total) by mouth daily. Reported on 02/05/2016 Qty: 90 capsule, Refills: 3    Multiple Vitamins-Minerals (MULTIVITAMIN WITH MINERALS) tablet Take 1 tablet by mouth daily.    omeprazole (PRILOSEC) 40 MG capsule Take 1 capsule (40 mg total) by mouth daily. Qty:  90 capsule, Refills: 3    senna (SENOKOT) 8.6 MG tablet Take 1 tablet by mouth 2 (two) times daily as needed for constipation. Reported on 02/05/2016    vitamin C (ASCORBIC ACID) 500 MG tablet Take 500 mg by mouth 2 (two) times daily. Reported on 05/11/2016      STOP taking these medications     Amino Acids-Protein Hydrolys (FEEDING SUPPLEMENT, PRO-STAT SUGAR FREE 64,) LIQD        Allergies  Allergen Reactions  . Penicillins Shortness Of Breath and Swelling    Has patient had a PCN reaction causing immediate rash, facial/tongue/throat swelling, SOB or lightheadedness with hypotension: yes Has  patient had a PCN reaction causing severe rash involving mucus membranes or skin necrosis: no Has patient had a PCN reaction that required hospitalization: unknown Has patient had a PCN reaction occurring within the last 10 years: no If all of the above answers are "NO", then may proceed with Cephalosporin use.   . Voltaren [Diclofenac Sodium] Other (See Comments)    Stomach irritation  . Dulcolax [Bisacodyl]     Unknown   . Duloxetine Hcl Other (See Comments)    Made patient not want to eat or drink  . Timolol     REACTION: bradycardia worse   Follow-up Information    Cathlean Cower, MD .   Specialties:  Internal Medicine, Radiology Contact information: [place] [address] [phone]        Manus Gunning, MD .   Specialty:  Gastroenterology Why:  He will be called for manometry appointment Contact information: [address] [place] [address] [phone]            The results of significant diagnostics from this hospitalization (including imaging, microbiology, ancillary and laboratory) are listed below for reference.    Significant Diagnostic Studies: Dg Chest 2 View  Result Date: 07/08/2016 CLINICAL DATA:  Leukocytosis EXAM: CHEST  2 VIEW COMPARISON:  05/24/2016 chest radiograph. FINDINGS: Stable cardiomediastinal silhouette with top-normal heart size and aortic atherosclerosis. No pneumothorax. Trace bilateral pleural effusions. There is new patchy consolidation in the right greater than left lower lobes. No pulmonary edema. IMPRESSION: 1. New patchy consolidation in the right greater than left lower lobes, most suggestive of multifocal pneumonia and/or aspiration pneumonitis. Recommend follow-up PA and lateral post treatment chest radiographs in 4-6 weeks. 2. Trace bilateral pleural effusions. 3. Aortic atherosclerosis. Electronically Signed   By: Ilona Sorrel M.D.   On: 07/08/2016 09:27   Ct Head Wo Contrast  Result  Date: 07/07/2016 CLINICAL DATA:  Altered mental status with left facial droop EXAM: CT HEAD WITHOUT CONTRAST TECHNIQUE: Contiguous axial images were obtained from the base of the skull through the vertex without intravenous contrast. COMPARISON:  01/21/2016 FINDINGS: Brain: Diffuse atrophic changes are identified. Scattered areas of decreased attenuation are noted consistent with chronic white matter ischemic change. These are stable from the prior exam. Vascular: No hyperdense vessel or unexpected calcification. Skull: Normal. Negative for fracture or focal lesion. Sinuses/Orbits: No acute finding. IMPRESSION: Chronic atrophic and ischemic changes.  No acute abnormality noted. These results were called by telephone at the time of interpretation on 07/07/2016 at 10:39 am to Dr. Cristobal Goldmann, who verbally acknowledged these results. Electronically Signed   By: Inez Catalina M.D.   On: 07/07/2016 10:40   Mr Brain Wo Contrast  Result Date: 07/07/2016 CLINICAL DATA:  Initial evaluation for acute syncope. EXAM: MRI HEAD WITHOUT CONTRAST TECHNIQUE: Multiplanar,  multiecho pulse sequences of the brain and surrounding structures were obtained without intravenous contrast. COMPARISON:  Prior CT from earlier the same day. FINDINGS: Study mildly degraded by motion artifact. Diffuse prominence of the CSF containing spaces is compatible with generalized age-related cerebral atrophy. Patchy and confluent T2/FLAIR hyperintensity within the periventricular and deep white matter most consistent with chronic small vessel ischemic disease, mild for age. No abnormal foci of restricted diffusion to suggest acute ischemia. Gray-white matter differentiation maintained. Major intracranial vascular flow voids are preserved. No acute or chronic intracranial hemorrhage. No areas of chronic infarction. No mass lesion, midline shift, or mass effect. No hydrocephalus. No extra-axial fluid collection. Major dural sinuses are grossly patent.  Craniocervical junction normal. Mild degenerative spondylolysis noted at C3-4 without significant stenosis. Remainder the visualized upper cervical spine otherwise unremarkable. Incidental note made of an empty sella. No acute abnormality about the globes and orbits. Patient is status post lens extraction bilaterally. Paranasal sinuses are clear. No mastoid effusion. Inner ear structures grossly normal. Bone marrow signal intensity within normal limits. No scalp soft tissue abnormality. IMPRESSION: 1. No acute intracranial process identified. 2. Mild for age chronic microvascular ischemic disease. Electronically Signed   By: Jeannine Boga M.D.   On: 07/07/2016 22:12   Dg Esophagus  Result Date: 07/11/2016 CLINICAL DATA:  82 year old female with malnutrition and dysphagia. Suspicion of esophageal abnormality on speech pathology exam. 2015 esophagram demonstrating mild narrowing of the distal esophagus. Personal history of Billroth II procedure, reportedly for stomach cancer. Initial encounter. EXAM: ESOPHOGRAM/BARIUM SWALLOW TECHNIQUE: Single contrast examination was performed using  thin barium. FLUOROSCOPY TIME:  Fluoroscopy Time:  2 minutes 30 seconds Radiation Exposure Index (if provided by the fluoroscopic device): Number of Acquired Spot Images: 0 COMPARISON:  CT Abdomen and Pelvis 05/24/2016.  Chest CT 10/30/2014. FINDINGS: The patient ingested thin barium. Early images suggest retained food debris in the esophagus (series 1, image 31). There is an approximately 3 cm segment of progressed narrowing of the distal thoracic esophagus to the level of the gastroesophageal junction. See series 2, image 27, and compare to series 17, image 1 in 2015. This area of narrowing appears fairly smooth, but maintained a beaked appearance throughout much of the exam. Superimposed decreased esophageal motility and tertiary contractions occurred. The contrast which did reach the stomach rapidly emptied into the small  bowel via a gastrojejunostomy. Proximal small bowel loops are in the left upper quadrant. After a 10 minutes delay perhaps about half to 2/3 of the ingested thin barium remained within the esophagus as a column of barium which extended to the thoracic inlet (series 8 and series 9). IMPRESSION: 1. Progressed 3 cm segment of distal esophageal narrowing since the 2015 exam. On correlating today's esophogram appearance with the recent CT Abdomen and Pelvis in July I do note some extrinsic compression on the distal esophagus caused by the descending thoracic aorta, the heart, and IVC. Although that extrinsic compression seems unlikely to fully explain the esophagram appearance now whereby 1/2 to 2/3 of the ingested volume of barium was retained in the thoracic esophagus after a delay of 10 minutes. Favor progressive esophageal stricture. The smooth appearance of the narrowing, as well as the appearance on the recent CT Abdomen and Pelvis do NOT suggest recurrent gastric cancer as the cause of the narrowing. EGD follow-up may be most valuable at this point. 2. Superimposed presbyesophagus with tertiary contractions. 3. Patent gastrojejunostomy. Electronically Signed   By: Genevie Ann M.D.   On: 07/11/2016  13:20    Microbiology: Recent Results (from the past 240 hour(s))  Culture, Urine     Status: Abnormal   Collection Time: 07/08/16  7:02 PM  Result Value Ref Range Status   Specimen Description URINE, RANDOM  Final   Special Requests NONE  Final   Culture >=100,000 COLONIES/mL ESCHERICHIA COLI (A)  Final   Report Status 07/11/2016 FINAL  Final   Organism ID, Bacteria ESCHERICHIA COLI (A)  Final      Susceptibility   Escherichia coli - MIC*    AMPICILLIN >=32 RESISTANT Resistant     CEFAZOLIN <=4 SENSITIVE Sensitive     CEFTRIAXONE <=1 SENSITIVE Sensitive     CIPROFLOXACIN >=4 RESISTANT Resistant     GENTAMICIN <=1 SENSITIVE Sensitive     IMIPENEM <=0.25 SENSITIVE Sensitive     NITROFURANTOIN <=16  SENSITIVE Sensitive     TRIMETH/SULFA >=320 RESISTANT Resistant     AMPICILLIN/SULBACTAM >=32 RESISTANT Resistant     PIP/TAZO 8 SENSITIVE Sensitive     Extended ESBL NEGATIVE Sensitive     * >=100,000 COLONIES/mL ESCHERICHIA COLI     Labs: Basic Metabolic Panel:  Recent Labs Lab 07/07/16 1027 07/07/16 1034 07/07/16 1431 07/08/16 0203 07/09/16 0522 07/11/16 0513  NA 142 143  --  139 139 140  K 3.5 3.5  --  3.4* 3.4* 4.3  CL 110 106  --  109 111 111  CO2 23  --   --  23 25 24   GLUCOSE 101* 97  --  109* 75 83  BUN 9 10  --  12 11 10   CREATININE 0.89 0.80  --  0.90 0.89 0.97  CALCIUM 9.4  --   --  8.5* 8.5* 8.6*  MG  --   --  1.6*  --   --   --    Liver Function Tests:  Recent Labs Lab 07/07/16 1027 07/07/16 1431 07/08/16 0203  AST 27 22 19   ALT 11* 11* 9*  ALKPHOS 99 93 73  BILITOT 0.6 0.4 0.5  PROT 6.5 6.3* 4.9*  ALBUMIN 3.1* 3.0* 2.3*   No results for input(s): LIPASE, AMYLASE in the last 168 hours. No results for input(s): AMMONIA in the last 168 hours. CBC:  Recent Labs Lab 07/07/16 1027 07/07/16 1034 07/08/16 0203 07/08/16 0942 07/09/16 0522 07/11/16 0513  WBC 7.1  --  20.3* 19.7* 13.5* 5.5  NEUTROABS 5.1  --   --   --   --   --   HGB 12.2 13.9 9.8* 10.1* 8.7* 9.7*  HCT 36.7 41.0 29.9* 30.4* 25.8* 29.6*  MCV 100.0  --  100.3* 98.7 98.9 99.0  PLT 148*  --  113* 126* 99* 132*   Cardiac Enzymes:  Recent Labs Lab 07/07/16 1431 07/07/16 2008 07/08/16 0203  TROPONINI 0.03* 0.06* 0.06*   BNP: BNP (last 3 results) No results for input(s): BNP in the last 8760 hours.  ProBNP (last 3 results) No results for input(s): PROBNP in the last 8760 hours.  CBG: No results for input(s): GLUCAP in the last 168 hours.     SignedWaldron Labs, Iver Fehrenbach MD.  [facility] 07/13/2016, 2:12 PM

## 2016-07-13 NOTE — Telephone Encounter (Signed)
-----   Message from Manus Gunning, MD sent at 07/13/2016 10:24 AM EDT ----- Verlee Monte, This patient is being discharged today. I would like her to have an esophageal manometry sometime in the upcoming weeks if you can help coordinate, to rule out achalasia. There is no rush with this, I would not call for another day or so until they are out of the hospital . Her daughter is the point of contact. Thanks

## 2016-07-14 ENCOUNTER — Other Ambulatory Visit: Payer: Self-pay

## 2016-07-14 NOTE — Progress Notes (Signed)
rx pended  "Major" interaction with colchicine and fluconazole.  Marland Kitchen  Please advise  Plasma concentrations of colchicine may be increased with fluconazole.  Colchicine toxicity may occur.     Patients daughter notified to hold colchicine per Dr. Havery Moros while on fluconazole

## 2016-07-15 ENCOUNTER — Telehealth: Payer: Self-pay | Admitting: Gastroenterology

## 2016-07-15 MED ORDER — FLUCONAZOLE 200 MG PO TABS: ORAL_TABLET | ORAL | 0 refills | Status: DC

## 2016-07-15 NOTE — Telephone Encounter (Signed)
A user error has taken place: ERROR °

## 2016-07-15 NOTE — Progress Notes (Signed)
Thanks Sheri,  Please ask her to hold colchicine during time she is taking fluconazole due to this interaction. Thanks

## 2016-07-18 ENCOUNTER — Other Ambulatory Visit: Payer: Self-pay

## 2016-07-18 ENCOUNTER — Telehealth: Payer: Self-pay | Admitting: Gastroenterology

## 2016-07-18 NOTE — Telephone Encounter (Signed)
Patient states that this med does not need to be called in. She states that [facility] has already filled and delivered these meds.

## 2016-07-18 NOTE — Telephone Encounter (Signed)
Pt notified. Dr Havery Moros reviewing possible interactions with medications.  See alternate note.

## 2016-07-19 DIAGNOSIS — M150 Primary generalized (osteo)arthritis: Secondary | ICD-10-CM | POA: Diagnosis not present

## 2016-07-19 DIAGNOSIS — I10 Essential (primary) hypertension: Secondary | ICD-10-CM | POA: Diagnosis not present

## 2016-07-19 DIAGNOSIS — R1319 Other dysphagia: Secondary | ICD-10-CM | POA: Diagnosis not present

## 2016-07-19 DIAGNOSIS — G894 Chronic pain syndrome: Secondary | ICD-10-CM | POA: Diagnosis not present

## 2016-07-19 DIAGNOSIS — E1151 Type 2 diabetes mellitus with diabetic peripheral angiopathy without gangrene: Secondary | ICD-10-CM | POA: Diagnosis not present

## 2016-07-19 DIAGNOSIS — H409 Unspecified glaucoma: Secondary | ICD-10-CM | POA: Diagnosis not present

## 2016-07-19 DIAGNOSIS — M1000 Idiopathic gout, unspecified site: Secondary | ICD-10-CM | POA: Diagnosis not present

## 2016-07-19 DIAGNOSIS — G309 Alzheimer's disease, unspecified: Secondary | ICD-10-CM | POA: Diagnosis not present

## 2016-07-19 DIAGNOSIS — D649 Anemia, unspecified: Secondary | ICD-10-CM | POA: Diagnosis not present

## 2016-07-19 DIAGNOSIS — E1140 Type 2 diabetes mellitus with diabetic neuropathy, unspecified: Secondary | ICD-10-CM | POA: Diagnosis not present

## 2016-07-19 DIAGNOSIS — K5909 Other constipation: Secondary | ICD-10-CM | POA: Diagnosis not present

## 2016-07-19 NOTE — Telephone Encounter (Signed)
You have been scheduled for an esophageal manometry at [facility] on 08/08/16 at 1230 pm. Please arrive 30 minutes prior to your procedure for registration. You will need to go to outpatient registration (1st floor of the hospital) first. Make certain to bring your insurance cards as well as a complete list of medications.  Please remember the following:  1) Nothing to eat or drink after 12:00 midnight on the night before your test.  2) Hold all diabetic medications/insulin the morning of the test. You may eat and take your medications after the test.  3) For 3 days prior to your test do not take: Dexilant, Prevacid, Nexium, Protonix, Aciphex, Zegerid, Pantoprazole, Prilosec or omeprazole.  4) For 2 days prior to your test, do not take: Reglan, Tagamet, Zantac, Axid or Pepcid.  5) You MAY use an antacid such as Rolaids or Tums up to 12 hours prior to your test.  It will take at least 2 weeks to receive the results of this test from your physician. ------------------------------------------ ABOUT ESOPHAGEAL MANOMETRY Esophageal manometry (muh-NOM-uh-tree) is a test that gauges how well your esophagus works. Your esophagus is the long, muscular tube that connects your throat to your stomach. Esophageal manometry measures the rhythmic muscle contractions (peristalsis) that occur in your esophagus when you swallow. Esophageal manometry also measures the coordination and force exerted by the muscles of your esophagus.  During esophageal manometry, a thin, flexible tube (catheter) that contains sensors is passed through your nose, down your esophagus and into your stomach. Esophageal manometry can be helpful in diagnosing some mostly uncommon disorders that affect your esophagus.  Why it's done Esophageal manometry is used to evaluate the movement (motility) of food through the esophagus and into the stomach. The test measures how well the circular bands of muscle (sphincters) at the top and  bottom of your esophagus open and close, as well as the pressure, strength and pattern of the wave of esophageal muscle contractions that moves food along.  What you can expect Esophageal manometry is an outpatient procedure done without sedation. Most people tolerate it well. You may be asked to change into a hospital gown before the test starts.  During esophageal manometry  While you are sitting up, a member of your health care team sprays your throat with a numbing medication or puts numbing gel in your nose or both.  A catheter is guided through your nose into your esophagus. The catheter may be sheathed in a water-filled sleeve. It doesn't interfere with your breathing. However, your eyes may water, and you may gag. You may have a slight nosebleed from irritation.  After the catheter is in place, you may be asked to lie on your back on an exam table, or you may be asked to remain seated.  You then swallow small sips of water. As you do, a computer connected to the catheter records the pressure, strength and pattern of your esophageal muscle contractions.  During the test, you'll be asked to breathe slowly and smoothly, remain as still as possible, and swallow only when you're asked to do so.  A member of your health care team may move the catheter down into your stomach while the catheter continues its measurements.  The catheter then is slowly withdrawn. The test usually lasts 20 to 30 minutes.  After esophageal manometry  When your esophageal manometry is complete, you may return to your normal activities  This test typically takes 30-45 minutes to complete. ________________________________________________________________________________

## 2016-07-19 NOTE — Telephone Encounter (Signed)
The pt's daughter has been notified of the procedure and instructions.  She will call with any questions or concerns

## 2016-07-20 NOTE — Telephone Encounter (Signed)
Seee 07-14-16 orders only encounter. Patient was advised to hold colchicine while on fluconazole.

## 2016-07-21 ENCOUNTER — Telehealth: Payer: Self-pay | Admitting: *Deleted

## 2016-07-21 NOTE — Telephone Encounter (Signed)
Ok for verbal 

## 2016-07-21 NOTE — Telephone Encounter (Signed)
Notified [name] w/Dr. Jenny Reichmann response...Shari Mcmahon

## 2016-07-21 NOTE — Telephone Encounter (Signed)
Left msg on triage stating saw pt yesterday for eval. Plan of care for PT 1 x a week for 1 wk, then 2 x's wk for next 6 weeks. Is this ok...Shari Mcmahon

## 2016-07-27 DIAGNOSIS — D649 Anemia, unspecified: Secondary | ICD-10-CM | POA: Diagnosis not present

## 2016-07-27 DIAGNOSIS — R1319 Other dysphagia: Secondary | ICD-10-CM | POA: Diagnosis not present

## 2016-07-27 DIAGNOSIS — M1000 Idiopathic gout, unspecified site: Secondary | ICD-10-CM | POA: Diagnosis not present

## 2016-07-27 DIAGNOSIS — H409 Unspecified glaucoma: Secondary | ICD-10-CM | POA: Diagnosis not present

## 2016-07-27 DIAGNOSIS — I10 Essential (primary) hypertension: Secondary | ICD-10-CM | POA: Diagnosis not present

## 2016-07-27 DIAGNOSIS — G309 Alzheimer's disease, unspecified: Secondary | ICD-10-CM | POA: Diagnosis not present

## 2016-07-27 DIAGNOSIS — E1151 Type 2 diabetes mellitus with diabetic peripheral angiopathy without gangrene: Secondary | ICD-10-CM | POA: Diagnosis not present

## 2016-07-27 DIAGNOSIS — M150 Primary generalized (osteo)arthritis: Secondary | ICD-10-CM | POA: Diagnosis not present

## 2016-07-27 DIAGNOSIS — K5909 Other constipation: Secondary | ICD-10-CM | POA: Diagnosis not present

## 2016-07-27 DIAGNOSIS — E1140 Type 2 diabetes mellitus with diabetic neuropathy, unspecified: Secondary | ICD-10-CM | POA: Diagnosis not present

## 2016-07-27 DIAGNOSIS — G894 Chronic pain syndrome: Secondary | ICD-10-CM | POA: Diagnosis not present

## 2016-07-28 DIAGNOSIS — K5909 Other constipation: Secondary | ICD-10-CM | POA: Diagnosis not present

## 2016-07-28 DIAGNOSIS — H409 Unspecified glaucoma: Secondary | ICD-10-CM | POA: Diagnosis not present

## 2016-07-28 DIAGNOSIS — R1319 Other dysphagia: Secondary | ICD-10-CM | POA: Diagnosis not present

## 2016-07-28 DIAGNOSIS — E1151 Type 2 diabetes mellitus with diabetic peripheral angiopathy without gangrene: Secondary | ICD-10-CM | POA: Diagnosis not present

## 2016-07-28 DIAGNOSIS — I10 Essential (primary) hypertension: Secondary | ICD-10-CM | POA: Diagnosis not present

## 2016-07-28 DIAGNOSIS — M1000 Idiopathic gout, unspecified site: Secondary | ICD-10-CM | POA: Diagnosis not present

## 2016-07-28 DIAGNOSIS — M150 Primary generalized (osteo)arthritis: Secondary | ICD-10-CM | POA: Diagnosis not present

## 2016-07-28 DIAGNOSIS — D649 Anemia, unspecified: Secondary | ICD-10-CM | POA: Diagnosis not present

## 2016-07-28 DIAGNOSIS — E1140 Type 2 diabetes mellitus with diabetic neuropathy, unspecified: Secondary | ICD-10-CM | POA: Diagnosis not present

## 2016-07-28 DIAGNOSIS — G894 Chronic pain syndrome: Secondary | ICD-10-CM | POA: Diagnosis not present

## 2016-07-28 DIAGNOSIS — G309 Alzheimer's disease, unspecified: Secondary | ICD-10-CM | POA: Diagnosis not present

## 2016-07-29 DIAGNOSIS — L97423 Non-pressure chronic ulcer of left heel and midfoot with necrosis of muscle: Secondary | ICD-10-CM | POA: Diagnosis not present

## 2016-07-29 DIAGNOSIS — Z9181 History of falling: Secondary | ICD-10-CM | POA: Diagnosis not present

## 2016-08-02 ENCOUNTER — Ambulatory Visit: Payer: Medicare Other | Admitting: Internal Medicine

## 2016-08-04 ENCOUNTER — Ambulatory Visit: Payer: Medicare Other | Admitting: Internal Medicine

## 2016-08-08 ENCOUNTER — Encounter (HOSPITAL_COMMUNITY): Admission: RE | Payer: Self-pay | Source: Ambulatory Visit

## 2016-08-08 ENCOUNTER — Ambulatory Visit (HOSPITAL_COMMUNITY): Admission: RE | Admit: 2016-08-08 | Payer: Medicare Other | Source: Ambulatory Visit | Admitting: Gastroenterology

## 2016-08-08 SURGERY — MANOMETRY, ESOPHAGUS
Anesthesia: Choice

## 2016-08-08 NOTE — Progress Notes (Signed)
Called and talked with daughter at patients residence. They forgot about the appoinment today and will check their schedule and call Dr. Doyne Keel office to reschedule.

## 2016-08-09 ENCOUNTER — Telehealth: Payer: Self-pay

## 2016-08-09 ENCOUNTER — Ambulatory Visit (INDEPENDENT_AMBULATORY_CARE_PROVIDER_SITE_OTHER)
Admission: RE | Admit: 2016-08-09 | Discharge: 2016-08-09 | Disposition: A | Discharge status: Home / Self Care | Payer: Medicare Other | Source: Ambulatory Visit | Attending: Nurse Practitioner | Admitting: Nurse Practitioner

## 2016-08-09 ENCOUNTER — Other Ambulatory Visit (INDEPENDENT_AMBULATORY_CARE_PROVIDER_SITE_OTHER): Payer: Medicare Other

## 2016-08-09 ENCOUNTER — Encounter: Payer: Self-pay | Admitting: Nurse Practitioner

## 2016-08-09 ENCOUNTER — Ambulatory Visit (INDEPENDENT_AMBULATORY_CARE_PROVIDER_SITE_OTHER): Payer: Medicare Other | Admitting: Nurse Practitioner

## 2016-08-09 VITALS — BP 102/56 | HR 60 | Temp 97.6°F | Ht 65.0 in | Wt 136.0 lb

## 2016-08-09 DIAGNOSIS — R350 Frequency of micturition: Secondary | ICD-10-CM | POA: Diagnosis not present

## 2016-08-09 DIAGNOSIS — R918 Other nonspecific abnormal finding of lung field: Secondary | ICD-10-CM

## 2016-08-09 DIAGNOSIS — M1710 Unilateral primary osteoarthritis, unspecified knee: Secondary | ICD-10-CM

## 2016-08-09 DIAGNOSIS — J189 Pneumonia, unspecified organism: Secondary | ICD-10-CM | POA: Diagnosis not present

## 2016-08-09 DIAGNOSIS — I10 Essential (primary) hypertension: Secondary | ICD-10-CM

## 2016-08-09 DIAGNOSIS — G894 Chronic pain syndrome: Secondary | ICD-10-CM

## 2016-08-09 DIAGNOSIS — N3000 Acute cystitis without hematuria: Secondary | ICD-10-CM | POA: Diagnosis not present

## 2016-08-09 DIAGNOSIS — J690 Pneumonitis due to inhalation of food and vomit: Secondary | ICD-10-CM

## 2016-08-09 LAB — URINALYSIS, ROUTINE W REFLEX MICROSCOPIC
BILIRUBIN URINE: NEGATIVE
HGB URINE DIPSTICK: NEGATIVE
Ketones, ur: NEGATIVE
Leukocytes, UA: NEGATIVE
Nitrite: NEGATIVE
PH: 5.5 (ref 5.0–8.0)
RBC / HPF: NONE SEEN (ref 0–?)
Specific Gravity, Urine: 1.02 (ref 1.000–1.030)
Total Protein, Urine: NEGATIVE
UROBILINOGEN UA: 0.2 (ref 0.0–1.0)
Urine Glucose: NEGATIVE

## 2016-08-09 MED ORDER — PREGABALIN 75 MG PO CAPS: 75.0000 mg | ORAL_CAPSULE | Freq: Every evening | ORAL | 0 refills | Status: DC

## 2016-08-09 NOTE — Patient Instructions (Addendum)
Go to lab in basement to provide urine sample and for CXR.  Normal CXR and urinalysis today.  No further treatment needed at this time.

## 2016-08-09 NOTE — Progress Notes (Signed)
Subjective:  Patient ID: Shari Mcmahon, female    DOB: [birth year]  Age: 82 y.o. MRN: [id number]  CC: Hospitalization Follow-up (follow up for UTI and aspiration pneumonia)  Patient is at home with daughter-Shari Mcmahon, who is also her caregiver. Patient in wheelchair, stands with one assist and use of gait belt. Last hospitalization was 07/07/16. Treated for aspiration pneumonia and e.coli UTI with vantin and cipro. Treatment completed per patient and daughter. Patient denies any change in appetite, nausea, or vomiting or constipation or diarrhea or melena, or hematochezia.  Urinary Tract Infection   This is a recurrent problem. The current episode started 1 to 4 weeks ago. The problem occurs every urination. The problem has been unchanged. The patient is experiencing no pain. There has been no fever. She is not sexually active. There is no history of pyelonephritis. Associated symptoms include frequency and urgency. Pertinent negatives include no chills, discharge, flank pain, hematuria, hesitancy, nausea, possible pregnancy, sweats or vomiting. She has tried antibiotics for the symptoms. Her past medical history is significant for catheterization, recurrent UTIs and urinary stasis. There is no history of kidney stones, a single kidney or a urological procedure.   Knee pain (chronic): Currently taking colchicine and uloric. Lyrica was used while in hospital which helped with generalized joint pain. Patient is requested for refill. She currently has Home PT.  Outpatient Medications Prior to Visit  Medication Sig Dispense Refill  . acetaminophen (TYLENOL) 500 MG tablet Take 1 tablet (500 mg total) by mouth every 6 (six) hours as needed. (Patient taking differently: Take 1,000 mg by mouth 2 (two) times daily. ) 30 tablet 0  . amLODipine (NORVASC) 5 MG tablet Take 1 tablet (5 mg total) by mouth daily. 90 tablet 3  . colchicine 0.6 MG tablet Take 1 tablet (0.6 mg total) by mouth daily as  needed (Gout pain.). (Patient taking differently: Take 0.6 mg by mouth daily. ) 30 tablet 11  . donepezil (ARICEPT) 10 MG tablet Take 1 tablet (10 mg total) by mouth at bedtime. Reported on 02/05/2016 90 tablet 3  . febuxostat (ULORIC) 40 MG tablet Take 1 tablet (40 mg total) by mouth daily. Reported on 02/05/2016 90 tablet 3  . lactose free nutrition (BOOST PLUS) LIQD Take 237 mLs by mouth 3 (three) times daily between meals. (Patient taking differently: Take 237 mLs by mouth 2 (two) times daily as needed (between meals for appetite). )    . latanoprost (XALATAN) 0.005 % ophthalmic solution PLACE 1 DROP INTO BOTH EYES AT BEDTIME. 2.5 mL 2  . memantine (NAMENDA XR) 28 MG CP24 24 hr capsule Take 1 capsule (28 mg total) by mouth daily. Reported on 02/05/2016 90 capsule 3  . Multiple Vitamins-Minerals (MULTIVITAMIN WITH MINERALS) tablet Take 1 tablet by mouth daily.    Marland Kitchen omeprazole (PRILOSEC) 40 MG capsule Take 1 capsule (40 mg total) by mouth daily. 90 capsule 3  . senna (SENOKOT) 8.6 MG tablet Take 1 tablet by mouth 2 (two) times daily as needed for constipation. Reported on 02/05/2016    . vitamin C (ASCORBIC ACID) 500 MG tablet Take 500 mg by mouth 2 (two) times daily. Reported on 05/11/2016    . cefpodoxime (VANTIN) 200 MG tablet Take 1 tablet (200 mg total) by mouth every 12 (twelve) hours. For 4 days 8 tablet 0  . fluconazole (DIFLUCAN) 200 MG tablet Take 2 tablets by mouth on day one, then take one tablet daily for 13 days 15 tablet 0  .  pregabalin (LYRICA) 75 MG capsule Take 1 capsule (75 mg total) by mouth every evening. 30 capsule 0   No facility-administered medications prior to visit.     ROS See HPI  Objective:  BP (!) 102/56 (BP Location: Left Arm, Patient Position: Sitting, Cuff Size: Normal)   Pulse 60   Temp 97.6 F (36.4 C)   Ht 5\' 5"  (1.651 m)   Wt 136 lb (61.7 kg)   SpO2 98%   BMI 22.63 kg/m   BP Readings from Last 3 Encounters:  08/09/16 (!) 102/56  07/13/16 120/64    05/24/16 (!) 159/48    Wt Readings from Last 3 Encounters:  08/09/16 136 lb (61.7 kg)  07/11/16 133 lb 6.4 oz (60.5 kg)  05/11/16 134 lb (60.8 kg)    Physical Exam  Constitutional: She is oriented to person, place, and time. No distress.  Neck: Normal range of motion. Neck supple.  Cardiovascular: Normal rate and regular rhythm.   Murmur heard. Pulmonary/Chest: Effort normal and breath sounds normal.  Abdominal: Soft. Bowel sounds are normal.  Musculoskeletal: She exhibits tenderness. She exhibits no edema.       Right knee: She exhibits decreased range of motion, swelling and deformity. She exhibits no effusion and no erythema. Tenderness found.       Left knee: She exhibits decreased range of motion, swelling and deformity. She exhibits no effusion and no erythema. Tenderness found.       Right hand: She exhibits decreased range of motion, tenderness and bony tenderness.       Left hand: She exhibits tenderness and bony tenderness.  Neurological: She is alert and oriented to person, place, and time.  Vitals reviewed.   Lab Results  Component Value Date   WBC 5.5 07/11/2016   HGB 9.7 (L) 07/11/2016   HCT 29.6 (L) 07/11/2016   PLT 132 (L) 07/11/2016   GLUCOSE 83 07/11/2016   CHOL 154 06/10/2014   TRIG 104.0 06/10/2014   HDL 57.60 06/10/2014   LDLDIRECT 124.3 04/09/2010   LDLCALC 76 06/10/2014   ALT 9 (L) 07/08/2016   AST 19 07/08/2016   NA 140 07/11/2016   K 4.3 07/11/2016   CL 111 07/11/2016   CREATININE 0.97 07/11/2016   BUN 10 07/11/2016   CO2 24 07/11/2016   TSH 1.270 07/07/2016   INR 1.07 07/07/2016   HGBA1C 4.7 (L) 07/07/2016   MICROALBUR 2.0 (H) 11/19/2013    Dg Chest 2 View  Result Date: 07/08/2016 CLINICAL DATA:  Leukocytosis EXAM: CHEST  2 VIEW COMPARISON:  05/24/2016 chest radiograph. FINDINGS: Stable cardiomediastinal silhouette with top-normal heart size and aortic atherosclerosis. No pneumothorax. Trace bilateral pleural effusions. There is new  patchy consolidation in the right greater than left lower lobes. No pulmonary edema. IMPRESSION: 1. New patchy consolidation in the right greater than left lower lobes, most suggestive of multifocal pneumonia and/or aspiration pneumonitis. Recommend follow-up PA and lateral post treatment chest radiographs in 4-6 weeks. 2. Trace bilateral pleural effusions. 3. Aortic atherosclerosis. Electronically Signed   By: Ilona Sorrel M.D.   On: 07/08/2016 09:27   Ct Head Wo Contrast  Result Date: 07/07/2016 CLINICAL DATA:  Altered mental status with left facial droop EXAM: CT HEAD WITHOUT CONTRAST TECHNIQUE: Contiguous axial images were obtained from the base of the skull through the vertex without intravenous contrast. COMPARISON:  01/21/2016 FINDINGS: Brain: Diffuse atrophic changes are identified. Scattered areas of decreased attenuation are noted consistent with chronic white matter ischemic change. These are stable from  the prior exam. Vascular: No hyperdense vessel or unexpected calcification. Skull: Normal. Negative for fracture or focal lesion. Sinuses/Orbits: No acute finding. IMPRESSION: Chronic atrophic and ischemic changes.  No acute abnormality noted. These results were called by telephone at the time of interpretation on 07/07/2016 at 10:39 am to Dr. Cristobal Goldmann, who verbally acknowledged these results. Electronically Signed   By: Inez Catalina M.D.   On: 07/07/2016 10:40   Mr Brain Wo Contrast  Result Date: 07/07/2016 CLINICAL DATA:  Initial evaluation for acute syncope. EXAM: MRI HEAD WITHOUT CONTRAST TECHNIQUE: Multiplanar, multiecho pulse sequences of the brain and surrounding structures were obtained without intravenous contrast. COMPARISON:  Prior CT from earlier the same day. FINDINGS: Study mildly degraded by motion artifact. Diffuse prominence of the CSF containing spaces is compatible with generalized age-related cerebral atrophy. Patchy and confluent T2/FLAIR hyperintensity within the periventricular  and deep white matter most consistent with chronic small vessel ischemic disease, mild for age. No abnormal foci of restricted diffusion to suggest acute ischemia. Gray-white matter differentiation maintained. Major intracranial vascular flow voids are preserved. No acute or chronic intracranial hemorrhage. No areas of chronic infarction. No mass lesion, midline shift, or mass effect. No hydrocephalus. No extra-axial fluid collection. Major dural sinuses are grossly patent. Craniocervical junction normal. Mild degenerative spondylolysis noted at C3-4 without significant stenosis. Remainder the visualized upper cervical spine otherwise unremarkable. Incidental note made of an empty sella. No acute abnormality about the globes and orbits. Patient is status post lens extraction bilaterally. Paranasal sinuses are clear. No mastoid effusion. Inner ear structures grossly normal. Bone marrow signal intensity within normal limits. No scalp soft tissue abnormality. IMPRESSION: 1. No acute intracranial process identified. 2. Mild for age chronic microvascular ischemic disease. Electronically Signed   By: Jeannine Boga M.D.   On: 07/07/2016 22:12    Assessment & Plan:   Melba was seen today for hospitalization follow-up.  Diagnoses and all orders for this visit:  Increased urinary frequency -     Urinalysis, Routine w reflex microscopic; Future  Aspiration pneumonia of right lung due to regurgitated food, unspecified part of lung ([place]) -     Cancel: DG Chest 2 View; Future -     Cancel: CT CHEST WO CONTRAST; Future -     DG Chest 2 View; Future  Essential hypertension  Lung nodules -     Cancel: CT CHEST WO CONTRAST; Future -     DG Chest 2 View; Future  Arthritis of knee -     pregabalin (LYRICA) 75 MG capsule; Take 1 capsule (75 mg total) by mouth every evening.  Chronic pain syndrome -     pregabalin (LYRICA) 75 MG capsule; Take 1 capsule (75 mg total) by mouth every evening.   I have  discontinued Ms. Strite's cefpodoxime, fluconazole, and ciprofloxacin. I am also having her maintain her amLODipine, senna, vitamin C, lactose free nutrition, colchicine, donepezil, memantine, omeprazole, febuxostat, acetaminophen, multivitamin with minerals, latanoprost, clotrimazole-betamethasone, and pregabalin.  Meds ordered this encounter  Medications  . clotrimazole-betamethasone (LOTRISONE) cream  . DISCONTD: ciprofloxacin (CIPRO) 500 MG tablet  . pregabalin (LYRICA) 75 MG capsule    Sig: Take 1 capsule (75 mg total) by mouth every evening.    Dispense:  30 capsule    Refill:  0    Order Specific Question:   Supervising Provider    Answer:   Cassandria Anger [[id number]]    Follow-up: Return in about 2 months (around 10/09/2016) for HTN, dementia.  Baldo Ash  Gamble Enderle, NP

## 2016-08-09 NOTE — Telephone Encounter (Signed)
Home Health Cert/Plan of Care received (07/19/2016 - 09/16/2016) signed. Faxed and copy sent to scan

## 2016-08-09 NOTE — Progress Notes (Signed)
Pre visit review using our clinic review tool, if applicable. No additional management support is needed unless otherwise documented below in the visit note. 

## 2016-08-11 DIAGNOSIS — H409 Unspecified glaucoma: Secondary | ICD-10-CM | POA: Diagnosis not present

## 2016-08-11 DIAGNOSIS — G309 Alzheimer's disease, unspecified: Secondary | ICD-10-CM | POA: Diagnosis not present

## 2016-08-11 DIAGNOSIS — E1151 Type 2 diabetes mellitus with diabetic peripheral angiopathy without gangrene: Secondary | ICD-10-CM | POA: Diagnosis not present

## 2016-08-11 DIAGNOSIS — K5909 Other constipation: Secondary | ICD-10-CM | POA: Diagnosis not present

## 2016-08-11 DIAGNOSIS — M150 Primary generalized (osteo)arthritis: Secondary | ICD-10-CM | POA: Diagnosis not present

## 2016-08-11 DIAGNOSIS — I10 Essential (primary) hypertension: Secondary | ICD-10-CM | POA: Diagnosis not present

## 2016-08-11 DIAGNOSIS — R1319 Other dysphagia: Secondary | ICD-10-CM | POA: Diagnosis not present

## 2016-08-11 DIAGNOSIS — D649 Anemia, unspecified: Secondary | ICD-10-CM | POA: Diagnosis not present

## 2016-08-11 DIAGNOSIS — G894 Chronic pain syndrome: Secondary | ICD-10-CM | POA: Diagnosis not present

## 2016-08-11 DIAGNOSIS — M1000 Idiopathic gout, unspecified site: Secondary | ICD-10-CM | POA: Diagnosis not present

## 2016-08-11 DIAGNOSIS — E1140 Type 2 diabetes mellitus with diabetic neuropathy, unspecified: Secondary | ICD-10-CM | POA: Diagnosis not present

## 2016-08-16 DIAGNOSIS — G309 Alzheimer's disease, unspecified: Secondary | ICD-10-CM | POA: Diagnosis not present

## 2016-08-16 DIAGNOSIS — G894 Chronic pain syndrome: Secondary | ICD-10-CM | POA: Diagnosis not present

## 2016-08-16 DIAGNOSIS — M150 Primary generalized (osteo)arthritis: Secondary | ICD-10-CM | POA: Diagnosis not present

## 2016-08-16 DIAGNOSIS — K5909 Other constipation: Secondary | ICD-10-CM | POA: Diagnosis not present

## 2016-08-16 DIAGNOSIS — E1151 Type 2 diabetes mellitus with diabetic peripheral angiopathy without gangrene: Secondary | ICD-10-CM | POA: Diagnosis not present

## 2016-08-16 DIAGNOSIS — E1140 Type 2 diabetes mellitus with diabetic neuropathy, unspecified: Secondary | ICD-10-CM | POA: Diagnosis not present

## 2016-08-16 DIAGNOSIS — D649 Anemia, unspecified: Secondary | ICD-10-CM | POA: Diagnosis not present

## 2016-08-16 DIAGNOSIS — R1319 Other dysphagia: Secondary | ICD-10-CM | POA: Diagnosis not present

## 2016-08-16 DIAGNOSIS — H409 Unspecified glaucoma: Secondary | ICD-10-CM | POA: Diagnosis not present

## 2016-08-16 DIAGNOSIS — M1000 Idiopathic gout, unspecified site: Secondary | ICD-10-CM | POA: Diagnosis not present

## 2016-08-16 DIAGNOSIS — I10 Essential (primary) hypertension: Secondary | ICD-10-CM | POA: Diagnosis not present

## 2016-08-23 DIAGNOSIS — M150 Primary generalized (osteo)arthritis: Secondary | ICD-10-CM | POA: Diagnosis not present

## 2016-08-23 DIAGNOSIS — G309 Alzheimer's disease, unspecified: Secondary | ICD-10-CM | POA: Diagnosis not present

## 2016-08-23 DIAGNOSIS — M1000 Idiopathic gout, unspecified site: Secondary | ICD-10-CM | POA: Diagnosis not present

## 2016-08-23 DIAGNOSIS — I10 Essential (primary) hypertension: Secondary | ICD-10-CM

## 2016-08-23 DIAGNOSIS — E1140 Type 2 diabetes mellitus with diabetic neuropathy, unspecified: Secondary | ICD-10-CM

## 2016-08-23 DIAGNOSIS — F0280 Dementia in other diseases classified elsewhere without behavioral disturbance: Secondary | ICD-10-CM | POA: Diagnosis not present

## 2016-08-24 DIAGNOSIS — K5909 Other constipation: Secondary | ICD-10-CM | POA: Diagnosis not present

## 2016-08-24 DIAGNOSIS — E1151 Type 2 diabetes mellitus with diabetic peripheral angiopathy without gangrene: Secondary | ICD-10-CM | POA: Diagnosis not present

## 2016-08-24 DIAGNOSIS — G894 Chronic pain syndrome: Secondary | ICD-10-CM | POA: Diagnosis not present

## 2016-08-24 DIAGNOSIS — G309 Alzheimer's disease, unspecified: Secondary | ICD-10-CM | POA: Diagnosis not present

## 2016-08-24 DIAGNOSIS — M150 Primary generalized (osteo)arthritis: Secondary | ICD-10-CM | POA: Diagnosis not present

## 2016-08-24 DIAGNOSIS — R1319 Other dysphagia: Secondary | ICD-10-CM | POA: Diagnosis not present

## 2016-08-24 DIAGNOSIS — I10 Essential (primary) hypertension: Secondary | ICD-10-CM | POA: Diagnosis not present

## 2016-08-24 DIAGNOSIS — H409 Unspecified glaucoma: Secondary | ICD-10-CM | POA: Diagnosis not present

## 2016-08-24 DIAGNOSIS — M1000 Idiopathic gout, unspecified site: Secondary | ICD-10-CM | POA: Diagnosis not present

## 2016-08-24 DIAGNOSIS — E1140 Type 2 diabetes mellitus with diabetic neuropathy, unspecified: Secondary | ICD-10-CM | POA: Diagnosis not present

## 2016-08-24 DIAGNOSIS — D649 Anemia, unspecified: Secondary | ICD-10-CM | POA: Diagnosis not present

## 2016-08-28 DIAGNOSIS — Z9181 History of falling: Secondary | ICD-10-CM | POA: Diagnosis not present

## 2016-08-28 DIAGNOSIS — L97423 Non-pressure chronic ulcer of left heel and midfoot with necrosis of muscle: Secondary | ICD-10-CM | POA: Diagnosis not present

## 2016-09-01 DIAGNOSIS — R1319 Other dysphagia: Secondary | ICD-10-CM | POA: Diagnosis not present

## 2016-09-01 DIAGNOSIS — M1000 Idiopathic gout, unspecified site: Secondary | ICD-10-CM | POA: Diagnosis not present

## 2016-09-01 DIAGNOSIS — H409 Unspecified glaucoma: Secondary | ICD-10-CM | POA: Diagnosis not present

## 2016-09-01 DIAGNOSIS — G309 Alzheimer's disease, unspecified: Secondary | ICD-10-CM | POA: Diagnosis not present

## 2016-09-01 DIAGNOSIS — D649 Anemia, unspecified: Secondary | ICD-10-CM | POA: Diagnosis not present

## 2016-09-01 DIAGNOSIS — G894 Chronic pain syndrome: Secondary | ICD-10-CM | POA: Diagnosis not present

## 2016-09-01 DIAGNOSIS — M150 Primary generalized (osteo)arthritis: Secondary | ICD-10-CM | POA: Diagnosis not present

## 2016-09-01 DIAGNOSIS — K5909 Other constipation: Secondary | ICD-10-CM | POA: Diagnosis not present

## 2016-09-01 DIAGNOSIS — E1140 Type 2 diabetes mellitus with diabetic neuropathy, unspecified: Secondary | ICD-10-CM | POA: Diagnosis not present

## 2016-09-01 DIAGNOSIS — E1151 Type 2 diabetes mellitus with diabetic peripheral angiopathy without gangrene: Secondary | ICD-10-CM | POA: Diagnosis not present

## 2016-09-01 DIAGNOSIS — I10 Essential (primary) hypertension: Secondary | ICD-10-CM | POA: Diagnosis not present

## 2016-09-06 ENCOUNTER — Telehealth: Payer: Self-pay | Admitting: Internal Medicine

## 2016-09-06 NOTE — Telephone Encounter (Signed)
Ok for verbal 

## 2016-09-06 NOTE — Telephone Encounter (Signed)
gracie is calling to request verbals for PT   2 week 2

## 2016-09-07 DIAGNOSIS — R1319 Other dysphagia: Secondary | ICD-10-CM | POA: Diagnosis not present

## 2016-09-07 DIAGNOSIS — M150 Primary generalized (osteo)arthritis: Secondary | ICD-10-CM | POA: Diagnosis not present

## 2016-09-07 DIAGNOSIS — K5909 Other constipation: Secondary | ICD-10-CM | POA: Diagnosis not present

## 2016-09-07 DIAGNOSIS — E1151 Type 2 diabetes mellitus with diabetic peripheral angiopathy without gangrene: Secondary | ICD-10-CM | POA: Diagnosis not present

## 2016-09-07 DIAGNOSIS — E1140 Type 2 diabetes mellitus with diabetic neuropathy, unspecified: Secondary | ICD-10-CM | POA: Diagnosis not present

## 2016-09-07 DIAGNOSIS — D649 Anemia, unspecified: Secondary | ICD-10-CM | POA: Diagnosis not present

## 2016-09-07 DIAGNOSIS — G309 Alzheimer's disease, unspecified: Secondary | ICD-10-CM | POA: Diagnosis not present

## 2016-09-07 DIAGNOSIS — M1000 Idiopathic gout, unspecified site: Secondary | ICD-10-CM | POA: Diagnosis not present

## 2016-09-07 DIAGNOSIS — H409 Unspecified glaucoma: Secondary | ICD-10-CM | POA: Diagnosis not present

## 2016-09-07 DIAGNOSIS — G894 Chronic pain syndrome: Secondary | ICD-10-CM | POA: Diagnosis not present

## 2016-09-07 DIAGNOSIS — I10 Essential (primary) hypertension: Secondary | ICD-10-CM | POA: Diagnosis not present

## 2016-09-14 DIAGNOSIS — D649 Anemia, unspecified: Secondary | ICD-10-CM | POA: Diagnosis not present

## 2016-09-14 DIAGNOSIS — R1319 Other dysphagia: Secondary | ICD-10-CM | POA: Diagnosis not present

## 2016-09-14 DIAGNOSIS — K5909 Other constipation: Secondary | ICD-10-CM | POA: Diagnosis not present

## 2016-09-14 DIAGNOSIS — E1140 Type 2 diabetes mellitus with diabetic neuropathy, unspecified: Secondary | ICD-10-CM | POA: Diagnosis not present

## 2016-09-14 DIAGNOSIS — M150 Primary generalized (osteo)arthritis: Secondary | ICD-10-CM | POA: Diagnosis not present

## 2016-09-14 DIAGNOSIS — G309 Alzheimer's disease, unspecified: Secondary | ICD-10-CM | POA: Diagnosis not present

## 2016-09-14 DIAGNOSIS — E1151 Type 2 diabetes mellitus with diabetic peripheral angiopathy without gangrene: Secondary | ICD-10-CM | POA: Diagnosis not present

## 2016-09-14 DIAGNOSIS — H409 Unspecified glaucoma: Secondary | ICD-10-CM | POA: Diagnosis not present

## 2016-09-14 DIAGNOSIS — I10 Essential (primary) hypertension: Secondary | ICD-10-CM | POA: Diagnosis not present

## 2016-09-14 DIAGNOSIS — G894 Chronic pain syndrome: Secondary | ICD-10-CM | POA: Diagnosis not present

## 2016-09-14 DIAGNOSIS — M1000 Idiopathic gout, unspecified site: Secondary | ICD-10-CM | POA: Diagnosis not present

## 2016-09-20 DIAGNOSIS — I10 Essential (primary) hypertension: Secondary | ICD-10-CM | POA: Diagnosis not present

## 2016-09-20 DIAGNOSIS — R1319 Other dysphagia: Secondary | ICD-10-CM | POA: Diagnosis not present

## 2016-09-20 DIAGNOSIS — E1140 Type 2 diabetes mellitus with diabetic neuropathy, unspecified: Secondary | ICD-10-CM | POA: Diagnosis not present

## 2016-09-20 DIAGNOSIS — D649 Anemia, unspecified: Secondary | ICD-10-CM | POA: Diagnosis not present

## 2016-09-20 DIAGNOSIS — K5909 Other constipation: Secondary | ICD-10-CM | POA: Diagnosis not present

## 2016-09-20 DIAGNOSIS — E1151 Type 2 diabetes mellitus with diabetic peripheral angiopathy without gangrene: Secondary | ICD-10-CM | POA: Diagnosis not present

## 2016-09-20 DIAGNOSIS — G894 Chronic pain syndrome: Secondary | ICD-10-CM | POA: Diagnosis not present

## 2016-09-20 DIAGNOSIS — H409 Unspecified glaucoma: Secondary | ICD-10-CM | POA: Diagnosis not present

## 2016-09-20 DIAGNOSIS — G309 Alzheimer's disease, unspecified: Secondary | ICD-10-CM | POA: Diagnosis not present

## 2016-09-20 DIAGNOSIS — M1000 Idiopathic gout, unspecified site: Secondary | ICD-10-CM | POA: Diagnosis not present

## 2016-09-20 DIAGNOSIS — M150 Primary generalized (osteo)arthritis: Secondary | ICD-10-CM | POA: Diagnosis not present

## 2016-09-27 ENCOUNTER — Telehealth: Payer: Self-pay | Admitting: Internal Medicine

## 2016-09-27 NOTE — Telephone Encounter (Signed)
Shari Mcmahon is calling to get verbals  Pt 2 week 6

## 2016-09-27 NOTE — Telephone Encounter (Signed)
Verbals given to home health

## 2016-09-27 NOTE — Telephone Encounter (Signed)
Ok for verbals 

## 2016-09-28 ENCOUNTER — Telehealth: Payer: Self-pay

## 2016-09-28 DIAGNOSIS — Z9181 History of falling: Secondary | ICD-10-CM | POA: Diagnosis not present

## 2016-09-28 DIAGNOSIS — L97423 Non-pressure chronic ulcer of left heel and midfoot with necrosis of muscle: Secondary | ICD-10-CM | POA: Diagnosis not present

## 2016-09-28 DIAGNOSIS — I70235 Atherosclerosis of native arteries of right leg with ulceration of other part of foot: Secondary | ICD-10-CM

## 2016-09-28 NOTE — Telephone Encounter (Signed)
I could have Shari Mcmahon see the patient 12/6 at 9:15 but I do not have any slots avail for ABIs and the techs will be at their meeting at 8 am.

## 2016-09-28 NOTE — Telephone Encounter (Signed)
Turned call to pt's daughter.  Reported her mother's right 4th toe has a corn or scab that came off, and the toe is draining a bloody drainage with an odor.  Reported she spoke to a nurse last week, and was waiting on an appt.  Stated the toe is swollen and red.  Denied fever.  Reported she has been cleaning it and applying Neosporin Oint. to the sore.  Questioned about pain.  Reported her mother has Neuropathy and Gout, and always has pain in her feet. Reported "the area is a little better."  Daughter requested to schedule the appt. next week because she is not able to bring her to the office this week.  Advised that a Scheduler will call her back with appt. Information.  Agreed.

## 2016-09-28 NOTE — Telephone Encounter (Signed)
-----   Message from Lujean Amel sent at 09/28/2016  1:21 PM EST ----- Regarding: Triage Phone Call Contact: [phone] [place] ladies! I need some assistance with this one. This patient's daughter Kathi Der called regarding her mother. She states she spoke w/ a nurse here on 09/21/16 and that nurse indicated she would have a scheduler call to make an appointment for her mom this week to see NP. I don't see a telephone encounter or staff message for this patient regarding this. She states her mom has a corn on toe that isn't healing well and would like her seen before her scheduled 6 mo follow up appt in January. Please advise.  Thanks, Anne Ng

## 2016-09-29 DIAGNOSIS — G309 Alzheimer's disease, unspecified: Secondary | ICD-10-CM | POA: Diagnosis not present

## 2016-09-29 DIAGNOSIS — M1000 Idiopathic gout, unspecified site: Secondary | ICD-10-CM | POA: Diagnosis not present

## 2016-09-29 DIAGNOSIS — M150 Primary generalized (osteo)arthritis: Secondary | ICD-10-CM | POA: Diagnosis not present

## 2016-09-29 DIAGNOSIS — K5909 Other constipation: Secondary | ICD-10-CM | POA: Diagnosis not present

## 2016-09-29 DIAGNOSIS — D649 Anemia, unspecified: Secondary | ICD-10-CM | POA: Diagnosis not present

## 2016-09-29 DIAGNOSIS — E1151 Type 2 diabetes mellitus with diabetic peripheral angiopathy without gangrene: Secondary | ICD-10-CM | POA: Diagnosis not present

## 2016-09-29 DIAGNOSIS — H409 Unspecified glaucoma: Secondary | ICD-10-CM | POA: Diagnosis not present

## 2016-09-29 DIAGNOSIS — E1140 Type 2 diabetes mellitus with diabetic neuropathy, unspecified: Secondary | ICD-10-CM | POA: Diagnosis not present

## 2016-09-29 DIAGNOSIS — R1319 Other dysphagia: Secondary | ICD-10-CM | POA: Diagnosis not present

## 2016-09-29 DIAGNOSIS — G894 Chronic pain syndrome: Secondary | ICD-10-CM | POA: Diagnosis not present

## 2016-09-29 DIAGNOSIS — I10 Essential (primary) hypertension: Secondary | ICD-10-CM | POA: Diagnosis not present

## 2016-09-29 NOTE — Telephone Encounter (Signed)
I got her in for 12/6 at 9:15. I left a message for her daughter to confirm the appointment.

## 2016-10-04 ENCOUNTER — Encounter: Payer: Self-pay | Admitting: Family

## 2016-10-04 ENCOUNTER — Ambulatory Visit: Payer: Medicare Other | Admitting: Internal Medicine

## 2016-10-05 ENCOUNTER — Ambulatory Visit (INDEPENDENT_AMBULATORY_CARE_PROVIDER_SITE_OTHER): Payer: Medicare Other | Admitting: Family

## 2016-10-05 ENCOUNTER — Encounter: Payer: Self-pay | Admitting: Family

## 2016-10-05 VITALS — BP 121/56 | HR 54 | Temp 97.4°F

## 2016-10-05 DIAGNOSIS — Z87891 Personal history of nicotine dependence: Secondary | ICD-10-CM | POA: Diagnosis not present

## 2016-10-05 DIAGNOSIS — I70235 Atherosclerosis of native arteries of right leg with ulceration of other part of foot: Secondary | ICD-10-CM | POA: Diagnosis not present

## 2016-10-05 NOTE — Patient Instructions (Signed)

## 2016-10-05 NOTE — Progress Notes (Signed)
[facility] [place]   CC: Follow up peripheral artery occlusive disease  History of Present Illness RAIMEE ROETHLISBERGER is a 82 y.o. female patient of Dr. Scot Dock who had some necrotic toes on the right foot and ulceration of the left heel. She had evidence of severe tibial artery occlusive disease on exam and was not a candidate for revascularization. When Dr. Scot Dock discussed this with her before, she refused a more proximal amputation if needed and so they discussed amputation of the toes if they did not heal.   I do not get any clear-cut history of rest pain in her feet. She states that hanging the foot down does not improve her symptoms. This point she is nonambulatory. Shee denies fever or chills.  Pt fell fell fractured right hip in May of 2016, had ORIF, recovered significantly with PT and OT in a SNF and at home.   Pt returns today after phone call received from pt's daughter.  Reported her mother's right 4th toe has a corn or scab that came off, and the toe is draining a bloody drainage with an odor. Stated the toe is swollen and red.  Denied fever.  Reported she has been cleaning it and applying Neosporin Oint. to the sore.  Questioned about pain.  Reported her mother has Neuropathy and Gout, and always has pain in her feet. Reported "the area is a little better."   ABI's were requested but unable to fit into vascular lab schedule today.  Daughter states she accidentally stepped on pt's right toes about 2 weeks ago; the corn on her right 4th toe then started bleeding; daughter states the toe has improved with regular cleansing and neosporin ointment, gauze between toes. [facility] stopped visiting "a while back". Pt has also been treated at the wound care center in the past.  Dr. Scot Dock last evaluated pt on 05-11-16. At that time on the right LE she had a palpable femoral and popliteal pulse. She has a dampened monophasic dorsalis pedis signal with a fairly brisk  monophasic posterior tibial signal on the right. On the left side, she had a palpable femoral and popliteal pulse. She had a fairly brisk anterior tibial and posterior tibial signal although the signals are monophasic. At that visit there were no open wounds on the extremities with the exception of a very small wound on the distal aspect of the right fourth toe She has evidence of tibial artery occlusive disease and is not a candidate for revascularization. At that visit Dr. Scot Dock thought she has adequate circulation. Pt was to return in 6 months, sooner if she had problems.   Physical therapy sees pt in home twice/week.  Pt Diabetic: Yes, 4.7 A1C on 07-07-16 which is normal Pt smoker: former smoker  Pt meds include: Statin :No Betablocker: No ASA: No Other anticoagulants/antiplatelets: no  Past Medical History:  Diagnosis Date  . ABDOMINAL PAIN, CHRONIC 04/07/2008  . Aneurysm of thoracic aorta ([place])    "found 03/2015; not big enough to repair" (07/07/2016)  . ARTHRITIS 03/03/2008  . Arthritis    "shoulders, knees, hands" (07/07/2016)  . Arthritis of knee, degenerative 10/19/2011  . Aspiration pneumonia ([place])   . BRADYCARDIA, CHRONIC 12/17/2008  . CARDIAC MURMUR 02/01/2010  . Complication of anesthesia    "she retains carbon dioxide; sleeps too long" (07/07/2016)  . CONSTIPATION, CHRONIC 10/15/2010  . DEGENERATIVE JOINT DISEASE, KNEES, BILATERAL 12/07/2007  . DEPRESSION 06/17/2009  . DIABETES MELLITUS, TYPE II 09/19/2007  DIET CONTROL   . DVT (deep venous thrombosis) ([facility]) BLE  . DYSPHAGIA UNSPECIFIED 12/24/2009  . Gastroparesis 12/24/2009  . GENERALIZED OSTEOARTHROSIS INVOLVING HAND 10/03/2007  . GERD (gastroesophageal reflux disease)   . GLAUCOMA 09/19/2007  . GOUT 09/19/2007  . GOUTY ARTHROPATHY UNSPECIFIED 02/17/2009  . Hepatitis   . HYPERLIPIDEMIA 01/27/2010  . HYPERTENSION 09/19/2007  . LUNG NODULE 03/03/2008  . MENOPAUSAL DISORDER 12/17/2008  . PERIPHERAL NEUROPATHY 06/19/2008  .  PERSONAL HISTORY MALIGNANT NEOPLASM STOMACH 09/19/2007  . 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 STOMACH CANCER 03/03/2008  . UTI 12/15/2009    Social History Social History  Substance Use Topics  . Smoking status: Former Smoker    Packs/day: 0.50    Years: 25.00    Types: Cigarettes  . Smokeless tobacco: Never Used     Comment: "stopped when she was 82 years old"  . Alcohol use 0.0 oz/week     Comment: 07/07/2016 "nothing in years; stopped maybe in the  1980s"    Family History Family History  Problem Relation Age of Onset  . Uterine cancer Mother   . Stroke Father     Hemorrhagic  . Cancer Sister     breast cancer and one sister with uterine cancer and one sister with ovarian cancerr  . Colon cancer Maternal Grandmother   . Colon cancer Paternal Grandfather   . Diabetes Other   . Prostate cancer Brother   . Stomach cancer Neg Hx     Past Surgical History:  Procedure Laterality Date  . BALLOON DILATION N/A 07/12/2016   Procedure: BALLOON DILATION;  Surgeon: Manus Gunning, MD;  Location: [facility] ENDOSCOPY;  Service: Gastroenterology;  Laterality: N/A;  . BILROTH II PROCEDURE    . CATARACT EXTRACTION W/ INTRAOCULAR LENS  IMPLANT, BILATERAL    . COLONOSCOPY    . ESOPHAGOGASTRODUODENOSCOPY N/A 07/12/2016   Procedure: ESOPHAGOGASTRODUODENOSCOPY (EGD);  Surgeon: Manus Gunning, MD;  Location: [facility];  Service: Gastroenterology;  Laterality: N/A;  . FRACTURE SURGERY    . HIP FRACTURE SURGERY Right 03/2015    Allergies  Allergen Reactions  . Penicillins Shortness Of Breath and Swelling    Has patient had a PCN reaction causing immediate rash, facial/tongue/throat swelling, SOB or lightheadedness with hypotension: yes Has patient had a PCN reaction causing severe rash involving mucus membranes or skin necrosis: no Has patient had a PCN reaction that required hospitalization: unknown Has patient had a PCN reaction occurring within the last  10 years: no If all of the above answers are "NO", then may proceed with Cephalosporin use.   . Voltaren [Diclofenac Sodium] Other (See Comments)    Stomach irritation  . Dulcolax [Bisacodyl]     Unknown   . Duloxetine Hcl Other (See Comments)    Made patient not want to eat or drink  . Timolol     REACTION: bradycardia worse    Current Outpatient Prescriptions  Medication Sig Dispense Refill  . acetaminophen (TYLENOL) 500 MG tablet Take 1 tablet (500 mg total) by mouth every 6 (six) hours as needed. (Patient taking differently: Take 1,000 mg by mouth 2 (two) times daily. ) 30 tablet 0  . amLODipine (NORVASC) 5 MG tablet Take 1 tablet (5 mg total) by mouth daily. 90 tablet 3  . colchicine 0.6 MG tablet Take 1 tablet (0.6 mg total) by mouth daily as needed (Gout pain.). (Patient taking differently: Take 0.6 mg by mouth daily. ) 30 tablet 11  . donepezil (ARICEPT)  10 MG tablet Take 1 tablet (10 mg total) by mouth at bedtime. Reported on 02/05/2016 90 tablet 3  . febuxostat (ULORIC) 40 MG tablet Take 1 tablet (40 mg total) by mouth daily. Reported on 02/05/2016 90 tablet 3  . lactose free nutrition (BOOST PLUS) LIQD Take 237 mLs by mouth 3 (three) times daily between meals. (Patient taking differently: Take 237 mLs by mouth 2 (two) times daily as needed (between meals for appetite). )    . latanoprost (XALATAN) 0.005 % ophthalmic solution PLACE 1 DROP INTO BOTH EYES AT BEDTIME. 2.5 mL 2  . Multiple Vitamins-Minerals (MULTIVITAMIN WITH MINERALS) tablet Take 1 tablet by mouth daily.    . vitamin C (ASCORBIC ACID) 500 MG tablet Take 500 mg by mouth 2 (two) times daily. Reported on 05/11/2016    . clotrimazole-betamethasone (LOTRISONE) cream     . memantine (NAMENDA XR) 28 MG CP24 24 hr capsule Take 1 capsule (28 mg total) by mouth daily. Reported on 02/05/2016 (Patient not taking: Reported on 10/05/2016) 90 capsule 3  . omeprazole (PRILOSEC) 40 MG capsule Take 1 capsule (40 mg total) by mouth daily.  (Patient not taking: Reported on 10/05/2016) 90 capsule 3  . pregabalin (LYRICA) 75 MG capsule Take 1 capsule (75 mg total) by mouth every evening. (Patient not taking: Reported on 10/05/2016) 30 capsule 0  . senna (SENOKOT) 8.6 MG tablet Take 1 tablet by mouth 2 (two) times daily as needed for constipation. Reported on 02/05/2016     No current facility-administered medications for this visit.     ROS: See HPI for pertinent positives and negatives.   Physical Examination  Vitals:   10/05/16 0952 10/05/16 0956  BP: (!) 122/56 (!) 121/56  Pulse: (!) 55 (!) 54  Temp: 97.4 F (36.3 C)   TempSrc: Oral   SpO2: 98%    There is no height or weight on file to calculate BMI.  General: A&O x 3, WDWN, female seated in w/c. Gait: seated in w/c. Eyes: Pupils are equal Pulmonary: Respirations are non labored, CTAB, fair air movement Cardiac: regular rhythm, + murmur.         Carotid Bruits Right Left   Transmitted cardiac murmur transmitted cardiac murmur  Aorta is not palpable. Radial pulses: 2+ palpable and =                           VASCULAR EXAM: Extremities without ischemic changes, without Gangrene; with open wounds: right 4th toe with healing contracting small shallow wound, scant bleeding.                                                                                                          LE Pulses Right Left       FEMORAL  2+ palpable  2+ palpable        POPLITEAL  2+ palpable   2+ palpable       POSTERIOR TIBIAL  + Doppler signal and not palpable   not palpable, + Doppler signal  DORSALIS PEDIS      ANTERIOR TIBIAL not palpable, + Doppler signal  not palpable, + Doppler signal        PERONEAL not Palpable, + Doppler signal   not Palpable    Abdomen: soft, NT, no palpable masses. Skin: no rashes, see Extremities Musculoskeletal: no muscle wasting or atrophy.  Neurologic: A&O X 3; Appropriate Affect ; SENSATION: normal; MOTOR FUNCTION:  moving all  extremities equally, motor strength 4/5 throughout. Speech is fluent/normal. CN 2-12 intact.    Non-Invasive Vascular Imaging: DATE: 10/05/2016 none  ASSESSMENT: WALLIS MINARDI is a 82 y.o. female who had evidence of severe tibial artery occlusive disease on exam and was not a candidate for revascularization. Her left heel ulcer has heeled. Her right 4th toe had a corn that was accidentally stepped on by her daughter a couple of weeks ago, started bleeding according to daughter, but has improved with daily wound care and gauze between toes.  She has palpable bilateral femoral and popliteal pulses. She has strong Doppler signals in bilateral DP, PT, and right peroneal arteries.  Right 4th toe ulcer shows signs of healing and is very small and shallow. Pt is non ambulatory.  Dr. Scot Dock spoke with pt and daughter and examined pt.  PLAN:  Referal to [facility] to perform daily silvadene dressings. Based on the patient's vascular studies and examination, pt will return to clinic as already scheduled for ABI's and see Dr. Scot Dock on 11-16-16.   I discussed in depth with the patient the nature of atherosclerosis, and emphasized the importance of maximal medical management including strict control of blood pressure, blood glucose, and lipid levels, obtaining regular exercise, and continued cessation of smoking.  The patient is aware that without maximal medical management the underlying atherosclerotic disease process will progress, limiting the benefit of any interventions.  The patient was given information about PAD including signs, symptoms, treatment, what symptoms should prompt the patient to seek immediate medical care, and risk reduction measures to take.  Clemon Chambers, RN, MSN, FNP-C [facility] Phone: [phone]  Clinic MD: Scot Dock  10/05/16 10:44 AM

## 2016-10-11 ENCOUNTER — Telehealth: Payer: Self-pay | Admitting: Emergency Medicine

## 2016-10-11 NOTE — Telephone Encounter (Signed)
Ok to accept , ok for verbals

## 2016-10-11 NOTE — Telephone Encounter (Signed)
[facility] called and received wound care order from vein and vascular. Can she except the orders and if so can she get verbal orders for a nurse to go out and do wound care. Please advise thanks.

## 2016-10-12 DIAGNOSIS — I10 Essential (primary) hypertension: Secondary | ICD-10-CM | POA: Diagnosis not present

## 2016-10-12 DIAGNOSIS — D649 Anemia, unspecified: Secondary | ICD-10-CM | POA: Diagnosis not present

## 2016-10-12 DIAGNOSIS — E1151 Type 2 diabetes mellitus with diabetic peripheral angiopathy without gangrene: Secondary | ICD-10-CM | POA: Diagnosis not present

## 2016-10-12 DIAGNOSIS — G894 Chronic pain syndrome: Secondary | ICD-10-CM | POA: Diagnosis not present

## 2016-10-12 DIAGNOSIS — R1319 Other dysphagia: Secondary | ICD-10-CM | POA: Diagnosis not present

## 2016-10-12 DIAGNOSIS — M150 Primary generalized (osteo)arthritis: Secondary | ICD-10-CM | POA: Diagnosis not present

## 2016-10-12 DIAGNOSIS — K5909 Other constipation: Secondary | ICD-10-CM | POA: Diagnosis not present

## 2016-10-12 DIAGNOSIS — E1140 Type 2 diabetes mellitus with diabetic neuropathy, unspecified: Secondary | ICD-10-CM | POA: Diagnosis not present

## 2016-10-12 DIAGNOSIS — H409 Unspecified glaucoma: Secondary | ICD-10-CM | POA: Diagnosis not present

## 2016-10-12 DIAGNOSIS — M1000 Idiopathic gout, unspecified site: Secondary | ICD-10-CM | POA: Diagnosis not present

## 2016-10-12 DIAGNOSIS — G309 Alzheimer's disease, unspecified: Secondary | ICD-10-CM | POA: Diagnosis not present

## 2016-10-12 NOTE — Telephone Encounter (Signed)
Verbals given  

## 2016-10-13 DIAGNOSIS — I10 Essential (primary) hypertension: Secondary | ICD-10-CM | POA: Diagnosis not present

## 2016-10-13 DIAGNOSIS — G309 Alzheimer's disease, unspecified: Secondary | ICD-10-CM | POA: Diagnosis not present

## 2016-10-13 DIAGNOSIS — H409 Unspecified glaucoma: Secondary | ICD-10-CM | POA: Diagnosis not present

## 2016-10-13 DIAGNOSIS — K5909 Other constipation: Secondary | ICD-10-CM | POA: Diagnosis not present

## 2016-10-13 DIAGNOSIS — E1151 Type 2 diabetes mellitus with diabetic peripheral angiopathy without gangrene: Secondary | ICD-10-CM | POA: Diagnosis not present

## 2016-10-13 DIAGNOSIS — M150 Primary generalized (osteo)arthritis: Secondary | ICD-10-CM | POA: Diagnosis not present

## 2016-10-13 DIAGNOSIS — R1319 Other dysphagia: Secondary | ICD-10-CM | POA: Diagnosis not present

## 2016-10-13 DIAGNOSIS — D649 Anemia, unspecified: Secondary | ICD-10-CM | POA: Diagnosis not present

## 2016-10-13 DIAGNOSIS — E1140 Type 2 diabetes mellitus with diabetic neuropathy, unspecified: Secondary | ICD-10-CM | POA: Diagnosis not present

## 2016-10-13 DIAGNOSIS — G894 Chronic pain syndrome: Secondary | ICD-10-CM | POA: Diagnosis not present

## 2016-10-13 DIAGNOSIS — M1000 Idiopathic gout, unspecified site: Secondary | ICD-10-CM | POA: Diagnosis not present

## 2016-10-18 ENCOUNTER — Ambulatory Visit: Payer: Self-pay | Admitting: Internal Medicine

## 2016-10-18 DIAGNOSIS — Z0289 Encounter for other administrative examinations: Secondary | ICD-10-CM | POA: Diagnosis not present

## 2016-10-19 DIAGNOSIS — I10 Essential (primary) hypertension: Secondary | ICD-10-CM | POA: Diagnosis not present

## 2016-10-19 DIAGNOSIS — K5909 Other constipation: Secondary | ICD-10-CM | POA: Diagnosis not present

## 2016-10-19 DIAGNOSIS — E1140 Type 2 diabetes mellitus with diabetic neuropathy, unspecified: Secondary | ICD-10-CM | POA: Diagnosis not present

## 2016-10-19 DIAGNOSIS — M1000 Idiopathic gout, unspecified site: Secondary | ICD-10-CM | POA: Diagnosis not present

## 2016-10-19 DIAGNOSIS — G309 Alzheimer's disease, unspecified: Secondary | ICD-10-CM | POA: Diagnosis not present

## 2016-10-19 DIAGNOSIS — D649 Anemia, unspecified: Secondary | ICD-10-CM | POA: Diagnosis not present

## 2016-10-19 DIAGNOSIS — M150 Primary generalized (osteo)arthritis: Secondary | ICD-10-CM | POA: Diagnosis not present

## 2016-10-19 DIAGNOSIS — H409 Unspecified glaucoma: Secondary | ICD-10-CM | POA: Diagnosis not present

## 2016-10-19 DIAGNOSIS — E1151 Type 2 diabetes mellitus with diabetic peripheral angiopathy without gangrene: Secondary | ICD-10-CM | POA: Diagnosis not present

## 2016-10-19 DIAGNOSIS — G894 Chronic pain syndrome: Secondary | ICD-10-CM | POA: Diagnosis not present

## 2016-10-19 DIAGNOSIS — R1319 Other dysphagia: Secondary | ICD-10-CM | POA: Diagnosis not present

## 2016-10-21 ENCOUNTER — Telehealth: Payer: Self-pay | Admitting: Internal Medicine

## 2016-10-21 MED ORDER — SILVER SULFADIAZINE 1 % EX CREA
1.0000 | TOPICAL_CREAM | Freq: Every day | CUTANEOUS | 0 refills | Status: DC
Start: 2016-10-21 — End: 2016-10-21

## 2016-10-21 MED ORDER — SILVER SULFADIAZINE 1 % EX CREA: 1.0000 "application " | TOPICAL_CREAM | Freq: Every day | CUTANEOUS | 0 refills | Status: DC

## 2016-10-21 NOTE — Telephone Encounter (Signed)
Needs silvadene for 3rd digit toe on right foot to be sent to [facility].  States they do have an order to apply but does not have a script.

## 2016-10-21 NOTE — Telephone Encounter (Signed)
Done erx - silvadene

## 2016-10-27 DIAGNOSIS — M1000 Idiopathic gout, unspecified site: Secondary | ICD-10-CM | POA: Diagnosis not present

## 2016-10-27 DIAGNOSIS — I10 Essential (primary) hypertension: Secondary | ICD-10-CM | POA: Diagnosis not present

## 2016-10-27 DIAGNOSIS — M150 Primary generalized (osteo)arthritis: Secondary | ICD-10-CM | POA: Diagnosis not present

## 2016-10-27 DIAGNOSIS — H409 Unspecified glaucoma: Secondary | ICD-10-CM | POA: Diagnosis not present

## 2016-10-27 DIAGNOSIS — G894 Chronic pain syndrome: Secondary | ICD-10-CM | POA: Diagnosis not present

## 2016-10-27 DIAGNOSIS — E1140 Type 2 diabetes mellitus with diabetic neuropathy, unspecified: Secondary | ICD-10-CM | POA: Diagnosis not present

## 2016-10-27 DIAGNOSIS — K5909 Other constipation: Secondary | ICD-10-CM | POA: Diagnosis not present

## 2016-10-27 DIAGNOSIS — D649 Anemia, unspecified: Secondary | ICD-10-CM | POA: Diagnosis not present

## 2016-10-27 DIAGNOSIS — R1319 Other dysphagia: Secondary | ICD-10-CM | POA: Diagnosis not present

## 2016-10-27 DIAGNOSIS — E1151 Type 2 diabetes mellitus with diabetic peripheral angiopathy without gangrene: Secondary | ICD-10-CM | POA: Diagnosis not present

## 2016-10-27 DIAGNOSIS — G309 Alzheimer's disease, unspecified: Secondary | ICD-10-CM | POA: Diagnosis not present

## 2016-10-28 DIAGNOSIS — Z9181 History of falling: Secondary | ICD-10-CM | POA: Diagnosis not present

## 2016-10-28 DIAGNOSIS — L97423 Non-pressure chronic ulcer of left heel and midfoot with necrosis of muscle: Secondary | ICD-10-CM | POA: Diagnosis not present

## 2016-11-04 ENCOUNTER — Telehealth: Payer: Self-pay | Admitting: Internal Medicine

## 2016-11-04 ENCOUNTER — Other Ambulatory Visit: Payer: Self-pay | Admitting: Nurse Practitioner

## 2016-11-04 DIAGNOSIS — M1710 Unilateral primary osteoarthritis, unspecified knee: Secondary | ICD-10-CM

## 2016-11-04 DIAGNOSIS — G894 Chronic pain syndrome: Secondary | ICD-10-CM

## 2016-11-04 NOTE — Telephone Encounter (Signed)
Requesting verbal to continue PT for 2 times a week for 2 weeks.

## 2016-11-04 NOTE — Telephone Encounter (Signed)
Verbals given  

## 2016-11-04 NOTE — Telephone Encounter (Signed)
Ok for verbal 

## 2016-11-04 NOTE — Telephone Encounter (Signed)
Given to lucy

## 2016-11-04 NOTE — Telephone Encounter (Signed)
Script faxed back to [facility]...Shari Mcmahon

## 2016-11-04 NOTE — Telephone Encounter (Signed)
Done hardcopy to Corinne  

## 2016-11-11 ENCOUNTER — Encounter: Payer: Self-pay | Admitting: Vascular Surgery

## 2016-11-14 DIAGNOSIS — R569 Unspecified convulsions: Secondary | ICD-10-CM

## 2016-11-15 ENCOUNTER — Telehealth: Payer: Self-pay

## 2016-11-15 DIAGNOSIS — H409 Unspecified glaucoma: Secondary | ICD-10-CM | POA: Diagnosis not present

## 2016-11-15 DIAGNOSIS — K5909 Other constipation: Secondary | ICD-10-CM | POA: Diagnosis not present

## 2016-11-15 DIAGNOSIS — D649 Anemia, unspecified: Secondary | ICD-10-CM | POA: Diagnosis not present

## 2016-11-15 DIAGNOSIS — G894 Chronic pain syndrome: Secondary | ICD-10-CM | POA: Diagnosis not present

## 2016-11-15 DIAGNOSIS — G309 Alzheimer's disease, unspecified: Secondary | ICD-10-CM | POA: Diagnosis not present

## 2016-11-15 DIAGNOSIS — E1140 Type 2 diabetes mellitus with diabetic neuropathy, unspecified: Secondary | ICD-10-CM | POA: Diagnosis not present

## 2016-11-15 DIAGNOSIS — M150 Primary generalized (osteo)arthritis: Secondary | ICD-10-CM | POA: Diagnosis not present

## 2016-11-15 DIAGNOSIS — M1000 Idiopathic gout, unspecified site: Secondary | ICD-10-CM | POA: Diagnosis not present

## 2016-11-15 DIAGNOSIS — R1319 Other dysphagia: Secondary | ICD-10-CM | POA: Diagnosis not present

## 2016-11-15 DIAGNOSIS — I10 Essential (primary) hypertension: Secondary | ICD-10-CM | POA: Diagnosis not present

## 2016-11-15 DIAGNOSIS — E1151 Type 2 diabetes mellitus with diabetic peripheral angiopathy without gangrene: Secondary | ICD-10-CM | POA: Diagnosis not present

## 2016-11-15 NOTE — Telephone Encounter (Signed)
A user error has taken place.

## 2016-11-16 ENCOUNTER — Ambulatory Visit (HOSPITAL_COMMUNITY): Payer: Medicare Other

## 2016-11-16 ENCOUNTER — Ambulatory Visit: Payer: Medicare Other | Admitting: Vascular Surgery

## 2016-11-22 ENCOUNTER — Telehealth: Payer: Self-pay | Admitting: Internal Medicine

## 2016-11-22 NOTE — Telephone Encounter (Signed)
[facility]  Patient Name: Shari Mcmahon  DOB: 08/19/1927    Initial Comment Caller states she is urinating frequently.    Nurse Assessment      Guidelines    Guideline Title Affirmed Question Affirmed Notes       Final Disposition User   FINAL ATTEMPT MADE - no message left Harlow Mares, RN, Suanne Marker    Comments  On recording, caller first says [phone], then [phone], but PC repeated and entered correctly as given.

## 2016-11-28 DIAGNOSIS — Z9181 History of falling: Secondary | ICD-10-CM | POA: Diagnosis not present

## 2016-11-28 DIAGNOSIS — L97423 Non-pressure chronic ulcer of left heel and midfoot with necrosis of muscle: Secondary | ICD-10-CM | POA: Diagnosis not present

## 2016-12-13 ENCOUNTER — Encounter: Payer: Self-pay | Admitting: Vascular Surgery

## 2016-12-21 ENCOUNTER — Ambulatory Visit: Payer: Medicare Other | Admitting: Vascular Surgery

## 2016-12-21 ENCOUNTER — Inpatient Hospital Stay (HOSPITAL_COMMUNITY): Admission: RE | Admit: 2016-12-21 | Payer: Medicare Other | Source: Ambulatory Visit

## 2017-01-17 ENCOUNTER — Other Ambulatory Visit: Payer: Self-pay | Admitting: Internal Medicine

## 2017-01-31 ENCOUNTER — Ambulatory Visit: Payer: Self-pay | Admitting: Internal Medicine

## 2017-02-02 ENCOUNTER — Encounter: Payer: Self-pay | Admitting: Internal Medicine

## 2017-02-02 ENCOUNTER — Ambulatory Visit (INDEPENDENT_AMBULATORY_CARE_PROVIDER_SITE_OTHER): Payer: Medicare Other | Admitting: Internal Medicine

## 2017-02-02 ENCOUNTER — Other Ambulatory Visit (INDEPENDENT_AMBULATORY_CARE_PROVIDER_SITE_OTHER): Payer: 59

## 2017-02-02 VITALS — BP 142/82 | HR 62 | Temp 97.4°F | Ht 65.0 in | Wt 127.0 lb

## 2017-02-02 DIAGNOSIS — G308 Other Alzheimer's disease: Secondary | ICD-10-CM

## 2017-02-02 DIAGNOSIS — E1151 Type 2 diabetes mellitus with diabetic peripheral angiopathy without gangrene: Secondary | ICD-10-CM

## 2017-02-02 DIAGNOSIS — E2839 Other primary ovarian failure: Secondary | ICD-10-CM

## 2017-02-02 DIAGNOSIS — Z0001 Encounter for general adult medical examination with abnormal findings: Secondary | ICD-10-CM | POA: Diagnosis not present

## 2017-02-02 DIAGNOSIS — F0280 Dementia in other diseases classified elsewhere without behavioral disturbance: Secondary | ICD-10-CM

## 2017-02-02 DIAGNOSIS — I10 Essential (primary) hypertension: Secondary | ICD-10-CM | POA: Diagnosis not present

## 2017-02-02 DIAGNOSIS — R32 Unspecified urinary incontinence: Secondary | ICD-10-CM

## 2017-02-02 HISTORY — DX: Unspecified urinary incontinence: R32

## 2017-02-02 LAB — CBC WITH DIFFERENTIAL/PLATELET
BASOS PCT: 0.9 % (ref 0.0–3.0)
Basophils Absolute: 0.1 10*3/uL (ref 0.0–0.1)
EOS ABS: 0.1 10*3/uL (ref 0.0–0.7)
EOS PCT: 1.3 % (ref 0.0–5.0)
HEMATOCRIT: 34.3 % — AB (ref 36.0–46.0)
HEMOGLOBIN: 11.3 g/dL — AB (ref 12.0–15.0)
LYMPHS PCT: 26.9 % (ref 12.0–46.0)
Lymphs Abs: 1.8 10*3/uL (ref 0.7–4.0)
MCHC: 32.9 g/dL (ref 30.0–36.0)
MCV: 103.2 fl — ABNORMAL HIGH (ref 78.0–100.0)
MONO ABS: 0.7 10*3/uL (ref 0.1–1.0)
Monocytes Relative: 10.5 % (ref 3.0–12.0)
NEUTROS ABS: 4.1 10*3/uL (ref 1.4–7.7)
Neutrophils Relative %: 60.4 % (ref 43.0–77.0)
PLATELETS: 134 10*3/uL — AB (ref 150.0–400.0)
RBC: 3.33 Mil/uL — ABNORMAL LOW (ref 3.87–5.11)
RDW: 14.4 % (ref 11.5–15.5)
WBC: 6.7 10*3/uL (ref 4.0–10.5)

## 2017-02-02 LAB — TSH: TSH: 1.07 u[IU]/mL (ref 0.35–4.50)

## 2017-02-02 LAB — URINALYSIS, ROUTINE W REFLEX MICROSCOPIC
BILIRUBIN URINE: NEGATIVE
HGB URINE DIPSTICK: NEGATIVE
Ketones, ur: NEGATIVE
Leukocytes, UA: NEGATIVE
NITRITE: NEGATIVE
RBC / HPF: NONE SEEN (ref 0–?)
Specific Gravity, Urine: 1.025 (ref 1.000–1.030)
Total Protein, Urine: 30 — AB
Urine Glucose: NEGATIVE
Urobilinogen, UA: 0.2 (ref 0.0–1.0)
pH: 5.5 (ref 5.0–8.0)

## 2017-02-02 LAB — MICROALBUMIN / CREATININE URINE RATIO
CREATININE, U: 96.3 mg/dL
MICROALB UR: 27.2 mg/dL — AB (ref 0.0–1.9)
MICROALB/CREAT RATIO: 28.2 mg/g (ref 0.0–30.0)

## 2017-02-02 LAB — HEPATIC FUNCTION PANEL
ALT: 13 U/L (ref 0–35)
AST: 22 U/L (ref 0–37)
Albumin: 3.6 g/dL (ref 3.5–5.2)
Alkaline Phosphatase: 94 U/L (ref 39–117)
BILIRUBIN DIRECT: 0 mg/dL (ref 0.0–0.3)
BILIRUBIN TOTAL: 0.3 mg/dL (ref 0.2–1.2)
Total Protein: 7.4 g/dL (ref 6.0–8.3)

## 2017-02-02 LAB — BASIC METABOLIC PANEL
BUN: 17 mg/dL (ref 6–23)
CHLORIDE: 107 meq/L (ref 96–112)
CO2: 29 mEq/L (ref 19–32)
Calcium: 9.2 mg/dL (ref 8.4–10.5)
Creatinine, Ser: 0.97 mg/dL (ref 0.40–1.20)
GFR: 69.67 mL/min (ref >60.00)
Glucose, Bld: 74 mg/dL (ref 70–99)
POTASSIUM: 3.9 meq/L (ref 3.5–5.1)
Sodium: 141 mEq/L (ref 135–145)

## 2017-02-02 LAB — LIPID PANEL
CHOL/HDL RATIO: 2
Cholesterol: 180 mg/dL (ref 0–200)
HDL: 72.7 mg/dL (ref >39.00)
LDL CALC: 89 mg/dL (ref 0–99)
NONHDL: 107.4
Triglycerides: 90 mg/dL (ref 0.0–149.0)
VLDL: 18 mg/dL (ref 0.0–40.0)

## 2017-02-02 LAB — HEMOGLOBIN A1C: Hgb A1c MFr Bld: 5 % (ref 4.6–6.5)

## 2017-02-02 MED ORDER — NYSTATIN 100000 UNIT/GM EX POWD: CUTANEOUS | 1 refills | Status: DC

## 2017-02-02 NOTE — Patient Instructions (Addendum)
Please remember to call for you yearly eye doctor appt (see you pharmacy for the name)  Please schedule the bone density test before leaving today at the scheduling desk (where you check out)  You will be contacted regarding the referral for: Urology  Please continue all other medications as before, and refills have been done if requested.  Please have the pharmacy call with any other refills you may need.  Please continue your efforts at being more active, low cholesterol diet, and weight control.  You are otherwise up to date with prevention measures today.  Please keep your appointments with your specialists as you may have planned  Please go to the LAB in the Basement (turn left off the elevator) for the tests to be done today  You will be contacted by phone if any changes need to be made immediately.  Otherwise, you will receive a letter about your results with an explanation, but please check with MyChart first.  Please remember to sign up for MyChart if you have not done so, as this will be important to you in the future with finding out test results, communicating by private email, and scheduling acute appointments online when needed.  Please return in 6 months, or sooner if needed

## 2017-02-02 NOTE — Progress Notes (Signed)
Pre visit review using our clinic review tool, if applicable. No additional management support is needed unless otherwise documented below in the visit note. 

## 2017-02-02 NOTE — Progress Notes (Signed)
Subjective:    Patient ID: Shari Mcmahon, female    DOB: [birth year], 83 y.o.   MRN: [id number]  HPI  Here for wellness and f/u;  Overall doing ok;  Pt denies Chest pain, worsening SOB, DOE, wheezing, orthopnea, PND, worsening LE edema, palpitations, dizziness or syncope.  Pt denies neurological change such as new headache, facial or extremity weakness.  Pt denies polydipsia, polyuria, or low sugar symptoms. Pt states overall good compliance with treatment and medications, good tolerability, and has been trying to follow appropriate diet.  Pt denies worsening depressive symptoms, suicidal ideation or panic. No fever, night sweats, wt loss, loss of appetite, or other constitutional symptoms.  Pt states good ability with ADL's, has low fall risk, home safety reviewed and adequate, no other significant changes in hearing or vision, and only occasionally active with exercise. Due for optho f/u, dxa.    Also here with 1 mo urinary symptoms of urinary leakage every 5 min, but has frank incontence at night with diaper wet every am.  [facility] with [facility], no home health at this time.  No pain, urgency, hematuria or n/v, fever, chills. Does see vascular surgury every 4 mo but overall doing ok.   Past Medical History:  Diagnosis Date  . ABDOMINAL PAIN, CHRONIC 04/07/2008  . Aneurysm of thoracic aorta ([place])    "found 03/2015; not big enough to repair" (07/07/2016)  . ARTHRITIS 03/03/2008  . Arthritis    "shoulders, knees, hands" (07/07/2016)  . Arthritis of knee, degenerative 10/19/2011  . Aspiration pneumonia ([place])   . BRADYCARDIA, CHRONIC 12/17/2008  . CARDIAC MURMUR 02/01/2010  . Complication of anesthesia    "she retains carbon dioxide; sleeps too long" (07/07/2016)  . CONSTIPATION, CHRONIC 10/15/2010  . DEGENERATIVE JOINT DISEASE, KNEES, BILATERAL 12/07/2007  . DEPRESSION 06/17/2009  . DIABETES MELLITUS, TYPE II 09/19/2007   DIET CONTROL   . DVT (deep venous thrombosis) ([facility]) BLE  .  DYSPHAGIA UNSPECIFIED 12/24/2009  . Gastroparesis 12/24/2009  . GENERALIZED OSTEOARTHROSIS INVOLVING HAND 10/03/2007  . GERD (gastroesophageal reflux disease)   . GLAUCOMA 09/19/2007  . GOUT 09/19/2007  . GOUTY ARTHROPATHY UNSPECIFIED 02/17/2009  . Hepatitis   . HYPERLIPIDEMIA 01/27/2010  . HYPERTENSION 09/19/2007  . LUNG NODULE 03/03/2008  . MENOPAUSAL DISORDER 12/17/2008  . PERIPHERAL NEUROPATHY 06/19/2008  . PERSONAL HISTORY MALIGNANT NEOPLASM STOMACH 09/19/2007  . 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 STOMACH CANCER 03/03/2008  . UTI 12/15/2009   Past Surgical History:  Procedure Laterality Date  . BALLOON DILATION N/A 07/12/2016   Procedure: BALLOON DILATION;  Surgeon: Manus Gunning, MD;  Location: [facility] ENDOSCOPY;  Service: Gastroenterology;  Laterality: N/A;  . BILROTH II PROCEDURE    . CATARACT EXTRACTION W/ INTRAOCULAR LENS  IMPLANT, BILATERAL    . COLONOSCOPY    . ESOPHAGOGASTRODUODENOSCOPY N/A 07/12/2016   Procedure: ESOPHAGOGASTRODUODENOSCOPY (EGD);  Surgeon: Manus Gunning, MD;  Location: [facility];  Service: Gastroenterology;  Laterality: N/A;  . FRACTURE SURGERY    . HIP FRACTURE SURGERY Right 03/2015    reports that she has quit smoking. Her smoking use included Cigarettes. She has a 12.50 pack-year smoking history. She has never used smokeless tobacco. She reports that she drinks alcohol. She reports that she does not use drugs. family history includes Cancer in her sister; Colon cancer in her maternal grandmother and paternal grandfather; Diabetes in her other; Prostate cancer in her brother; Stroke in her father; Uterine cancer in her mother. Allergies  Allergen Reactions  . Penicillins Shortness Of Breath and Swelling    Has patient had a PCN reaction causing immediate rash, facial/tongue/throat swelling, SOB or lightheadedness with hypotension: yes Has patient had a PCN reaction causing severe rash involving mucus membranes or skin  necrosis: no Has patient had a PCN reaction that required hospitalization: unknown Has patient had a PCN reaction occurring within the last 10 years: no If all of the above answers are "NO", then may proceed with Cephalosporin use.   . Voltaren [Diclofenac Sodium] Other (See Comments)    Stomach irritation  . Dulcolax [Bisacodyl]     Unknown   . Duloxetine Hcl Other (See Comments)    Made patient not want to eat or drink  . Timolol     REACTION: bradycardia worse   Current Outpatient Prescriptions on File Prior to Visit  Medication Sig Dispense Refill  . acetaminophen (TYLENOL) 500 MG tablet Take 1 tablet (500 mg total) by mouth every 6 (six) hours as needed. (Patient taking differently: Take 1,000 mg by mouth 2 (two) times daily. ) 30 tablet 0  . amLODipine (NORVASC) 5 MG tablet Take 1 tablet (5 mg total) by mouth daily. 90 tablet 3  . clotrimazole-betamethasone (LOTRISONE) cream     . colchicine 0.6 MG tablet Take 1 tablet (0.6 mg total) by mouth daily as needed (Gout pain.). (Patient taking differently: Take 0.6 mg by mouth daily. ) 30 tablet 11  . donepezil (ARICEPT) 10 MG tablet Take 1 tablet (10 mg total) by mouth at bedtime. Reported on 02/05/2016 90 tablet 3  . febuxostat (ULORIC) 40 MG tablet Take 1 tablet (40 mg total) by mouth daily. Reported on 02/05/2016 90 tablet 3  . lactose free nutrition (BOOST PLUS) LIQD Take 237 mLs by mouth 3 (three) times daily between meals. (Patient taking differently: Take 237 mLs by mouth 2 (two) times daily as needed (between meals for appetite). )    . latanoprost (XALATAN) 0.005 % ophthalmic solution PLACE 1 DROP INTO BOTH EYES AT BEDTIME. 2.5 mL 0  . LYRICA 75 MG capsule TAKE 1 CAPSULE BY MOUTH EVERY EVENING 30 capsule 3  . memantine (NAMENDA XR) 28 MG CP24 24 hr capsule Take 1 capsule (28 mg total) by mouth daily. Reported on 02/05/2016 90 capsule 3  . Multiple Vitamins-Minerals (MULTIVITAMIN WITH MINERALS) tablet Take 1 tablet by mouth daily.    Marland Kitchen  omeprazole (PRILOSEC) 40 MG capsule Take 1 capsule (40 mg total) by mouth daily. 90 capsule 3  . vitamin C (ASCORBIC ACID) 500 MG tablet Take 500 mg by mouth 2 (two) times daily. Reported on 05/11/2016     No current facility-administered medications on file prior to visit.     Review of Systems Constitutional: Negative for other unusual diaphoresis, sweats, appetite or weight changes HENT: Negative for other worsening hearing loss, ear pain, facial swelling, mouth sores or neck stiffness.   Eyes: Negative for other worsening pain, redness or other visual disturbance.  Respiratory: Negative for other stridor or swelling Cardiovascular: Negative for other palpitations or other chest pain  Gastrointestinal: Negative for worsening diarrhea or loose stools, blood in stool, distention or other pain Genitourinary: Negative for hematuria, flank pain or other change in urine volume.  Musculoskeletal: Negative for myalgias or other joint swelling.  Skin: Negative for other color change, or other wound or worsening drainage.  Neurological: Negative for other syncope or numbness. Hematological: Negative for other adenopathy or swelling Psychiatric/Behavioral: Negative for hallucinations, other worsening agitation, SI,  self-injury, or new decreased concentration All other system neg per pt    Objective:   Physical Exam BP (!) 142/82   Pulse 62   Temp 97.4 F (36.3 C) (Oral)   Ht 5\' 5"  (1.651 m)   Wt 127 lb (57.6 kg)   SpO2 98%   BMI 21.13 kg/m  VS noted,  Constitutional: Pt is oriented to person, place, and time. Appears well-developed and well-nourished, in no significant distress and comfortable Head: Normocephalic and atraumatic  Eyes: Conjunctivae and EOM are normal. Pupils are equal, round, and reactive to light Right Ear: External ear normal without discharge Left Ear: External ear normal without discharge Nose: Nose without discharge or deformity Mouth/Throat: Oropharynx is without  other ulcerations and moist  Neck: Normal range of motion. Neck supple. No JVD present. No tracheal deviation present or significant neck LA or mass Cardiovascular: Normal rate, regular rhythm, normal heart sounds and intact distal pulses.   Pulmonary/Chest: WOB normal and breath sounds without rales or wheezing  Abdominal: Soft. Bowel sounds are normal. NT. No HSM  Musculoskeletal: Normal range of motion. Exhibits no edema Lymphadenopathy: Has no other cervical adenopathy.  Neurological: Pt is alert and oriented to person, place, but not sure of time Pt has normal reflexes. No cranial nerve deficit. Motor grossly intact, Gait intact Skin: Skin is warm and dry. No rash noted or new ulcerations Psychiatric:  Has normal mood and affect. Behavior is normal without agitation No other exam findings     Assessment & Plan:

## 2017-02-04 LAB — URINE CULTURE

## 2017-02-05 ENCOUNTER — Other Ambulatory Visit: Payer: Self-pay | Admitting: Internal Medicine

## 2017-02-05 MED ORDER — NITROFURANTOIN MONOHYD MACRO 100 MG PO CAPS: 100.0000 mg | ORAL_CAPSULE | Freq: Two times a day (BID) | ORAL | 0 refills | Status: DC

## 2017-02-05 NOTE — Assessment & Plan Note (Signed)

## 2017-02-05 NOTE — Assessment & Plan Note (Addendum)
For urine cx, refer urology,  to f/u any worsening symptoms or concerns

## 2017-02-05 NOTE — Assessment & Plan Note (Signed)
stable overall by history and exam, recent data reviewed with pt, and pt to continue medical treatment as before,  to f/u any worsening symptoms or concerns Lab Results  Component Value Date   HGBA1C 5.0 02/02/2017

## 2017-02-05 NOTE — Assessment & Plan Note (Signed)
stable overall by history and exam, and pt to continue medical treatment as before,  to f/u any worsening symptoms or concerns 

## 2017-02-06 ENCOUNTER — Telehealth: Payer: Self-pay

## 2017-02-06 NOTE — Telephone Encounter (Signed)
Called pt, left a detailed msg.

## 2017-02-06 NOTE — Telephone Encounter (Signed)
-----   Message from Biagio Borg, MD sent at 02/05/2017  5:55 PM EDT ----- Left message on MyChart, pt to cont same tx except  The test results show that your current treatment is OK.,except there is a urinary infection.  I will send and antibiotic to your pharmacy.  You should be notified from the office about this as well    Shirron to please inform pt, I will do rx

## 2017-02-07 ENCOUNTER — Other Ambulatory Visit: Payer: Self-pay | Admitting: Internal Medicine

## 2017-02-09 ENCOUNTER — Telehealth: Payer: Self-pay | Admitting: Internal Medicine

## 2017-02-09 MED ORDER — NITROFURANTOIN MONOHYD MACRO 100 MG PO CAPS: 100.0000 mg | ORAL_CAPSULE | Freq: Two times a day (BID) | ORAL | 0 refills | Status: DC

## 2017-02-09 NOTE — Telephone Encounter (Signed)
I cannot explain the problem since macrobid is an antibiotic that was sent to the pharmacy previously, and documented as done on the EMR (is on the med list)  I have resent the macrobid to [facility], the pharmacy of record

## 2017-02-09 NOTE — Telephone Encounter (Signed)
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 Please send. Thanks!

## 2017-02-09 NOTE — Telephone Encounter (Signed)
Patient uses [facility].  Can this be sent there?

## 2017-02-09 NOTE — Telephone Encounter (Signed)
States Dr. Jenny Reichmann was suppose to call an antibiotic in to [facility] for UTI.  States pharmacy has not received.  Please follow up in regard.

## 2017-02-10 MED ORDER — NITROFURANTOIN MONOHYD MACRO 100 MG PO CAPS: 100.0000 mg | ORAL_CAPSULE | Freq: Two times a day (BID) | ORAL | 0 refills | Status: DC

## 2017-02-10 NOTE — Telephone Encounter (Signed)
Done

## 2017-02-15 ENCOUNTER — Encounter: Payer: Self-pay | Admitting: Vascular Surgery

## 2017-02-17 ENCOUNTER — Other Ambulatory Visit: Payer: Self-pay | Admitting: Internal Medicine

## 2017-02-22 ENCOUNTER — Encounter (HOSPITAL_COMMUNITY): Payer: Medicare Other

## 2017-02-22 ENCOUNTER — Ambulatory Visit: Payer: Medicare Other | Admitting: Vascular Surgery

## 2017-02-27 ENCOUNTER — Telehealth: Payer: Self-pay | Admitting: Internal Medicine

## 2017-02-27 DIAGNOSIS — R32 Unspecified urinary incontinence: Secondary | ICD-10-CM

## 2017-02-27 NOTE — Telephone Encounter (Signed)
Pt called in and would like to be referred to [facility] urology .  She is not able to hold her urine.  She is frustrated.  She fill like her bladder is leaking

## 2017-02-28 NOTE — Telephone Encounter (Signed)
Called pt, left a detailed msg with info below.

## 2017-02-28 NOTE — Telephone Encounter (Signed)
Referral done

## 2017-03-10 ENCOUNTER — Other Ambulatory Visit: Payer: Self-pay | Admitting: Internal Medicine

## 2017-03-10 DIAGNOSIS — M10041 Idiopathic gout, right hand: Secondary | ICD-10-CM

## 2017-03-15 ENCOUNTER — Other Ambulatory Visit: Payer: Self-pay | Admitting: Internal Medicine

## 2017-03-29 ENCOUNTER — Ambulatory Visit: Payer: Medicare Other | Admitting: Internal Medicine

## 2017-04-02 ENCOUNTER — Emergency Department (HOSPITAL_COMMUNITY): Payer: Medicare Other

## 2017-04-02 ENCOUNTER — Encounter (HOSPITAL_COMMUNITY): Payer: Self-pay | Admitting: *Deleted

## 2017-04-02 ENCOUNTER — Emergency Department (HOSPITAL_COMMUNITY)
Admission: EM | Admit: 2017-04-02 | Discharge: 2017-04-02 | Disposition: A | Discharge status: Home / Self Care | Payer: Medicare Other | Attending: Emergency Medicine | Admitting: Emergency Medicine

## 2017-04-02 DIAGNOSIS — M542 Cervicalgia: Secondary | ICD-10-CM | POA: Diagnosis not present

## 2017-04-02 DIAGNOSIS — S82101A Unspecified fracture of upper end of right tibia, initial encounter for closed fracture: Secondary | ICD-10-CM

## 2017-04-02 DIAGNOSIS — G8911 Acute pain due to trauma: Secondary | ICD-10-CM | POA: Diagnosis not present

## 2017-04-02 DIAGNOSIS — Y939 Activity, unspecified: Secondary | ICD-10-CM | POA: Insufficient documentation

## 2017-04-02 DIAGNOSIS — S82191A Other fracture of upper end of right tibia, initial encounter for closed fracture: Secondary | ICD-10-CM | POA: Diagnosis not present

## 2017-04-02 DIAGNOSIS — M79661 Pain in right lower leg: Secondary | ICD-10-CM | POA: Diagnosis not present

## 2017-04-02 DIAGNOSIS — S0083XA Contusion of other part of head, initial encounter: Secondary | ICD-10-CM | POA: Diagnosis not present

## 2017-04-02 DIAGNOSIS — S0101XA Laceration without foreign body of scalp, initial encounter: Secondary | ICD-10-CM

## 2017-04-02 DIAGNOSIS — E1143 Type 2 diabetes mellitus with diabetic autonomic (poly)neuropathy: Secondary | ICD-10-CM | POA: Diagnosis not present

## 2017-04-02 DIAGNOSIS — W01198A Fall on same level from slipping, tripping and stumbling with subsequent striking against other object, initial encounter: Secondary | ICD-10-CM | POA: Insufficient documentation

## 2017-04-02 DIAGNOSIS — M79651 Pain in right thigh: Secondary | ICD-10-CM | POA: Diagnosis not present

## 2017-04-02 DIAGNOSIS — Z87891 Personal history of nicotine dependence: Secondary | ICD-10-CM | POA: Diagnosis not present

## 2017-04-02 DIAGNOSIS — Y92002 Bathroom of unspecified non-institutional (private) residence single-family (private) house as the place of occurrence of the external cause: Secondary | ICD-10-CM | POA: Insufficient documentation

## 2017-04-02 DIAGNOSIS — S8991XA Unspecified injury of right lower leg, initial encounter: Secondary | ICD-10-CM | POA: Diagnosis present

## 2017-04-02 DIAGNOSIS — Z85028 Personal history of other malignant neoplasm of stomach: Secondary | ICD-10-CM | POA: Insufficient documentation

## 2017-04-02 DIAGNOSIS — S79911A Unspecified injury of right hip, initial encounter: Secondary | ICD-10-CM | POA: Diagnosis not present

## 2017-04-02 DIAGNOSIS — M25561 Pain in right knee: Secondary | ICD-10-CM | POA: Diagnosis not present

## 2017-04-02 DIAGNOSIS — S89091A Other physeal fracture of upper end of right tibia, initial encounter for closed fracture: Secondary | ICD-10-CM | POA: Insufficient documentation

## 2017-04-02 DIAGNOSIS — I10 Essential (primary) hypertension: Secondary | ICD-10-CM | POA: Diagnosis not present

## 2017-04-02 DIAGNOSIS — S098XXA Other specified injuries of head, initial encounter: Secondary | ICD-10-CM | POA: Diagnosis not present

## 2017-04-02 DIAGNOSIS — S0990XA Unspecified injury of head, initial encounter: Secondary | ICD-10-CM | POA: Diagnosis not present

## 2017-04-02 DIAGNOSIS — R531 Weakness: Secondary | ICD-10-CM | POA: Diagnosis not present

## 2017-04-02 DIAGNOSIS — E119 Type 2 diabetes mellitus without complications: Secondary | ICD-10-CM | POA: Diagnosis not present

## 2017-04-02 DIAGNOSIS — Y999 Unspecified external cause status: Secondary | ICD-10-CM | POA: Diagnosis not present

## 2017-04-02 DIAGNOSIS — S92909A Unspecified fracture of unspecified foot, initial encounter for closed fracture: Secondary | ICD-10-CM | POA: Diagnosis not present

## 2017-04-02 DIAGNOSIS — M7989 Other specified soft tissue disorders: Secondary | ICD-10-CM | POA: Diagnosis not present

## 2017-04-02 DIAGNOSIS — R51 Headache: Secondary | ICD-10-CM | POA: Diagnosis not present

## 2017-04-02 DIAGNOSIS — W19XXXA Unspecified fall, initial encounter: Secondary | ICD-10-CM

## 2017-04-02 LAB — URINALYSIS, ROUTINE W REFLEX MICROSCOPIC
BILIRUBIN URINE: NEGATIVE
Glucose, UA: NEGATIVE mg/dL
Hgb urine dipstick: NEGATIVE
KETONES UR: NEGATIVE mg/dL
NITRITE: NEGATIVE
PH: 5 (ref 5.0–8.0)
Protein, ur: 100 mg/dL — AB
Specific Gravity, Urine: 1.015 (ref 1.005–1.030)

## 2017-04-02 MED ORDER — HYDROCODONE-ACETAMINOPHEN 5-325 MG PO TABS: 1.0000 | ORAL_TABLET | Freq: Four times a day (QID) | ORAL | 0 refills | Status: DC | PRN

## 2017-04-02 MED ORDER — HYDROCODONE-ACETAMINOPHEN 5-325 MG PO TABS
1.0000 | ORAL_TABLET | Freq: Once | ORAL | Status: AC
  Administered 2017-04-02: 1 via ORAL
  Filled 2017-04-02: qty 1

## 2017-04-02 MED ORDER — TETANUS-DIPHTH-ACELL PERTUSSIS 5-2.5-18.5 LF-MCG/0.5 IM SUSP
0.5000 mL | Freq: Once | INTRAMUSCULAR | Status: AC
  Administered 2017-04-02: 0.5 mL via INTRAMUSCULAR
  Filled 2017-04-02: qty 0.5

## 2017-04-02 MED ORDER — DOCUSATE SODIUM 100 MG PO CAPS: 100.0000 mg | ORAL_CAPSULE | Freq: Two times a day (BID) | ORAL | 0 refills | Status: AC | PRN

## 2017-04-02 NOTE — ED Triage Notes (Signed)
Per EMS, pt from home tripped and fell, hitting left side of head against the wall. Pt has laceration to left side and swelling to forehead. Pt denies loss of consciousness, is not on blood thinners. Pt complains of pain to right knee, which is swollen. Pt denies neck or back pain.   EMS VITALS: BP 150/80 HR 80 RR 18 CBG 98

## 2017-04-02 NOTE — ED Provider Notes (Signed)
[facility] DEPT Provider Note   CSN: [id number] Arrival date & time: 04/02/17  1151     History   Chief Complaint Chief Complaint  Patient presents with  . Fall  . Knee Pain  . Head Laceration    HPI Shari Mcmahon is a 83 y.o. female.  83yo F w/ PMH below including dementia, T2DM who p/w fall and head injury. PT was being assisted by daughter out of bathroom when she had a mechanical fall, striking her left side of her head against a wall and falling onto her R side. She reports pain in her left head and L leg from thigh to lower leg. She denies any neck or back pain. She states she may have lost consciousness for a second. No vomiting.    The history is provided by the patient.  Fall   Knee Pain    Head Laceration     Past Medical History:  Diagnosis Date  . ABDOMINAL PAIN, CHRONIC 04/07/2008  . Aneurysm of thoracic aorta ([place])    "found 03/2015; not big enough to repair" (07/07/2016)  . ARTHRITIS 03/03/2008  . Arthritis    "shoulders, knees, hands" (07/07/2016)  . Arthritis of knee, degenerative 10/19/2011  . Aspiration pneumonia ([place])   . BRADYCARDIA, CHRONIC 12/17/2008  . CARDIAC MURMUR 02/01/2010  . Complication of anesthesia    "she retains carbon dioxide; sleeps too long" (07/07/2016)  . CONSTIPATION, CHRONIC 10/15/2010  . DEGENERATIVE JOINT DISEASE, KNEES, BILATERAL 12/07/2007  . DEPRESSION 06/17/2009  . DIABETES MELLITUS, TYPE II 09/19/2007   DIET CONTROL   . DVT (deep venous thrombosis) ([facility]) BLE  . DYSPHAGIA UNSPECIFIED 12/24/2009  . Gastroparesis 12/24/2009  . GENERALIZED OSTEOARTHROSIS INVOLVING HAND 10/03/2007  . GERD (gastroesophageal reflux disease)   . GLAUCOMA 09/19/2007  . GOUT 09/19/2007  . GOUTY ARTHROPATHY UNSPECIFIED 02/17/2009  . Hepatitis   . HYPERLIPIDEMIA 01/27/2010  . HYPERTENSION 09/19/2007  . LUNG NODULE 03/03/2008  . MENOPAUSAL DISORDER 12/17/2008  . PERIPHERAL NEUROPATHY 06/19/2008  . PERSONAL HISTORY MALIGNANT NEOPLASM STOMACH 09/19/2007    . 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 STOMACH CANCER 03/03/2008  . UTI 12/15/2009    Patient Active Problem List   Diagnosis Date Noted  . Encounter for well adult exam with abnormal findings 02/02/2017  . Urinary incontinence 02/02/2017  . Seizure-like activity ([place])   . Syncope 07/07/2016  . Atherosclerosis of native arteries of the extremities with ulceration ([place]) 05/11/2016    Class: Chronic  . Weakness generalized   . Hypernatremia   . Aspiration pneumonia ([place])   . AKI (acute kidney injury) ([place])   . Ulcer of left heel and midfoot with necrosis of muscle ([place]) 01/17/2016  . Type II diabetes mellitus with peripheral circulatory disorder ([place]) 01/01/2016  . Pressure ulcer 01/01/2016  . Peripheral autonomic neuropathy due to diabetes mellitus ([place]) 11/15/2015  . GERD without esophagitis 10/30/2015  . Alzheimer's dementia 07/28/2015  . Fracture of right femur ([place]) 07/28/2015  . Arthritis of knee 05/19/2015  . Anemia 02/25/2015  . Loss of weight 09/17/2014  . Chronic pain syndrome 06/16/2014  . Primary localized osteoarthrosis, lower leg 01/08/2014  . Rotator cuff tear arthropathy of both shoulders 12/09/2013  . CONSTIPATION, CHRONIC 10/15/2010  . Hyperlipidemia 01/27/2010  . Gastroparesis 12/24/2009  . Dysphagia 12/24/2009  . DEPRESSION 06/17/2009  . BRADYCARDIA, CHRONIC 12/17/2008  . Peripheral neuropathy ([place]) 06/19/2008  . Lung nodules 03/03/2008  . GENERALIZED OSTEOARTHROSIS INVOLVING HAND 10/03/2007  . Gout 09/19/2007  .  GLAUCOMA 09/19/2007  . Essential hypertension 09/19/2007    Past Surgical History:  Procedure Laterality Date  . BALLOON DILATION N/A 07/12/2016   Procedure: BALLOON DILATION;  Surgeon: Manus Gunning, MD;  Location: [facility] ENDOSCOPY;  Service: Gastroenterology;  Laterality: N/A;  . BILROTH II PROCEDURE    . CATARACT EXTRACTION W/ INTRAOCULAR LENS  IMPLANT, BILATERAL    . COLONOSCOPY    .  ESOPHAGOGASTRODUODENOSCOPY N/A 07/12/2016   Procedure: ESOPHAGOGASTRODUODENOSCOPY (EGD);  Surgeon: Manus Gunning, MD;  Location: [facility];  Service: Gastroenterology;  Laterality: N/A;  . FRACTURE SURGERY    . HIP FRACTURE SURGERY Right 03/2015    OB History    No data available       Home Medications    Prior to Admission medications   Medication Sig Start Date End Date Taking? Authorizing Provider  amLODipine (NORVASC) 5 MG tablet TAKE 1 TABLET (5 MG TOTAL) BY MOUTH DAILY. 02/07/17  Yes Biagio Borg, MD  colchicine 0.6 MG tablet TAKE 1 TABLET (0.6 MG TOTAL) BY MOUTH DAILY AS NEEDED (GOUT PAIN.). Patient taking differently: Take 0.6 mg by mouth daily.  03/10/17  Yes Biagio Borg, MD  donepezil (ARICEPT) 10 MG tablet TAKE 1 TABLET (10 MG TOTAL) BY MOUTH AT BEDTIME. 03/15/17  Yes Biagio Borg, MD  lactose free nutrition (BOOST PLUS) LIQD Take 237 mLs by mouth 3 (three) times daily between meals. Patient taking differently: Take 237 mLs by mouth 2 (two) times daily as needed (between meals for appetite).  01/29/16  Yes Hongalgi, Lenis Dickinson, MD  latanoprost (XALATAN) 0.005 % ophthalmic solution PLACE 1 DROP INTO BOTH EYES AT BEDTIME. 01/17/17  Yes Biagio Borg, MD  Multiple Vitamins-Minerals (MULTIVITAMIN WITH MINERALS) tablet Take 1 tablet by mouth daily.   Yes [provider]  NAMENDA XR 28 MG CP24 24 hr capsule TAKE 1 CAPSULE (28 MG TOTAL) BY MOUTH DAILY. 02/07/17  Yes Biagio Borg, MD  omeprazole (PRILOSEC) 40 MG capsule TAKE 1 CAPSULE (40 MG TOTAL) BY MOUTH DAILY. Patient taking differently: Take 40 mg by mouth daily as needed (for acid reflux/indigestion).  02/17/17  Yes Biagio Borg, MD  ULORIC 40 MG tablet TAKE 1 TABLET (40 MG TOTAL) BY MOUTH DAILY. 02/17/17  Yes Biagio Borg, MD  vitamin C (ASCORBIC ACID) 500 MG tablet Take 500 mg by mouth 2 (two) times daily. Reported on 05/11/2016   Yes [provider]  docusate sodium (COLACE) 100 MG capsule Take 1  capsule (100 mg total) by mouth 2 (two) times daily as needed for mild constipation. 04/02/17   Altheia Shafran, Wenda Overland, MD  HYDROcodone-acetaminophen (NORCO/VICODIN) 5-325 MG tablet Take 1-2 tablets by mouth every 6 (six) hours as needed for severe pain. 04/02/17   Ulanda Tackett, Wenda Overland, MD  LYRICA 75 MG capsule TAKE 1 CAPSULE BY MOUTH EVERY EVENING Patient not taking: Reported on 04/02/2017 11/04/16   Biagio Borg, MD  nystatin (MYCOSTATIN/NYSTOP) powder Use as directed twice per day as needed Patient not taking: Reported on 04/02/2017 02/02/17   Biagio Borg, MD    Family History Family History  Problem Relation Age of Onset  . Uterine cancer Mother   . Stroke Father        Hemorrhagic  . Cancer Sister        breast cancer and one sister with uterine cancer and one sister with ovarian cancerr  . Colon cancer Maternal Grandmother   . Colon cancer Paternal Grandfather   .  Diabetes Other   . Prostate cancer Brother   . Stomach cancer Neg Hx     Social History Social History  Substance Use Topics  . Smoking status: Former Smoker    Packs/day: 0.50    Years: 25.00    Types: Cigarettes  . Smokeless tobacco: Never Used     Comment: "stopped when she was 83 years old"  . Alcohol use 0.0 oz/week     Comment: 07/07/2016 "nothing in years; stopped maybe in the  1980s"     Allergies   Penicillins; Voltaren [diclofenac sodium]; Dulcolax [bisacodyl]; Duloxetine hcl; and Timolol   Review of Systems Review of Systems All other systems reviewed and are negative except that which was mentioned in HPI  Physical Exam Updated Vital Signs BP (!) 172/45 (BP Location: Left Arm)   Pulse 65   Temp 97.5 F (36.4 C)   Resp 18   SpO2 100%   Physical Exam  Constitutional: She is oriented to person, place, and time. She appears well-developed and well-nourished. No distress.  HENT:  Head: Normocephalic.  2cm laceration on L parietal scalp Moist mucous membranes  Eyes: Conjunctivae are normal.  Pupils are equal, round, and reactive to light.  Neck:  In soft collar  Cardiovascular: Normal rate and regular rhythm.   Murmur heard. Pulmonary/Chest: Effort normal and breath sounds normal.  Abdominal: Soft. Bowel sounds are normal. She exhibits no distension. There is no tenderness.  Musculoskeletal: She exhibits edema and tenderness.  Tenderness of distal R femur, proximal lower leg, and R knee with swelling of R knee; normal distal sensation  Neurological: She is alert and oriented to person, place, and time. She exhibits normal muscle tone.  Fluent speech  Skin: Skin is warm and dry.  Psychiatric: She has a normal mood and affect. Judgment normal.  Nursing note and vitals reviewed.    ED Treatments / Results  Labs (all labs ordered are listed, but only abnormal results are displayed) Labs Reviewed  URINALYSIS, ROUTINE W REFLEX MICROSCOPIC - Abnormal; Notable for the following:       Result Value   APPearance HAZY (*)    Protein, ur 100 (*)    Leukocytes, UA SMALL (*)    Bacteria, UA MANY (*)    Squamous Epithelial / LPF 0-5 (*)    All other components within normal limits  URINE CULTURE    EKG  EKG Interpretation None       Radiology Dg Tibia/fibula Right  Result Date: 04/02/2017 CLINICAL DATA:  Severe leg pain after fall. EXAM: RIGHT TIBIA AND FIBULA - 2 VIEW COMPARISON:  None. FINDINGS: Two views study shows marked demineralization. There is irregularity in the lateral tibial plateau stable in the interval and compatible with the degenerative change seen elsewhere in the lateral compartment. Chondrocalcinosis of the medial meniscus evident. No suspicious lytic or sclerotic osseous abnormality. IMPRESSION: Degenerative changes noted in the knee with diffuse demineralization. No acute bony abnormality. Ankle has not been well evaluated and if there is concern for ankle fracture, dedicated ankle films recommended. Electronically Signed   By: Misty Stanley M.D.   On:  04/02/2017 13:39   Ct Head Wo Contrast  Result Date: 04/02/2017 CLINICAL DATA:  [place] and hit left side of head. EXAM: CT HEAD WITHOUT CONTRAST CT CERVICAL SPINE WITHOUT CONTRAST TECHNIQUE: Multidetector CT imaging of the head and cervical spine was performed following the standard protocol without intravenous contrast. Multiplanar CT image reconstructions of the cervical spine were also generated. COMPARISON:  07/07/2016 head CT FINDINGS: CT HEAD FINDINGS Brain: There is no evidence for acute hemorrhage, hydrocephalus, mass lesion, or abnormal extra-axial fluid collection. No definite CT evidence for acute infarction. Diffuse loss of parenchymal volume is consistent with atrophy. Patchy low attenuation in the deep hemispheric and periventricular white matter is nonspecific, but likely reflects chronic microvascular ischemic demyelination. Vascular: Atherosclerotic calcification is visualized in the intracranial segments of the internal carotid arteries. No dense MCA sign. Major dural sinuses are unremarkable. Skull: No evidence for fracture. No worrisome lytic or sclerotic lesion. Sinuses/Orbits: The visualized paranasal sinuses and mastoid air cells are clear. Visualized portions of the globes and intraorbital fat are unremarkable. Other: Staples identified in the left parietal scalp. CT CERVICAL SPINE FINDINGS Alignment: Straightening of normal cervical lordosis. No subluxation. Skull base and vertebrae: No acute fracture. No primary bone lesion or focal pathologic process. Soft tissues and spinal canal: Multinodular change noted in the thyroid gland. No prevertebral soft tissue swelling. No evidence for gross hematoma in the spinal canal. Disc levels:  Loss of disc height seen at C3-4. Upper chest: Unremarkable. Other: None. IMPRESSION: 1. No acute intracranial abnormality. 2. Atrophy with chronic small vessel white matter ischemic disease. 3. Degenerative changes in the cervical spine without fracture.  Electronically Signed   By: Misty Stanley M.D.   On: 04/02/2017 13:45   Ct Cervical Spine Wo Contrast  Result Date: 04/02/2017 CLINICAL DATA:  [place] and hit left side of head. EXAM: CT HEAD WITHOUT CONTRAST CT CERVICAL SPINE WITHOUT CONTRAST TECHNIQUE: Multidetector CT imaging of the head and cervical spine was performed following the standard protocol without intravenous contrast. Multiplanar CT image reconstructions of the cervical spine were also generated. COMPARISON:  07/07/2016 head CT FINDINGS: CT HEAD FINDINGS Brain: There is no evidence for acute hemorrhage, hydrocephalus, mass lesion, or abnormal extra-axial fluid collection. No definite CT evidence for acute infarction. Diffuse loss of parenchymal volume is consistent with atrophy. Patchy low attenuation in the deep hemispheric and periventricular white matter is nonspecific, but likely reflects chronic microvascular ischemic demyelination. Vascular: Atherosclerotic calcification is visualized in the intracranial segments of the internal carotid arteries. No dense MCA sign. Major dural sinuses are unremarkable. Skull: No evidence for fracture. No worrisome lytic or sclerotic lesion. Sinuses/Orbits: The visualized paranasal sinuses and mastoid air cells are clear. Visualized portions of the globes and intraorbital fat are unremarkable. Other: Staples identified in the left parietal scalp. CT CERVICAL SPINE FINDINGS Alignment: Straightening of normal cervical lordosis. No subluxation. Skull base and vertebrae: No acute fracture. No primary bone lesion or focal pathologic process. Soft tissues and spinal canal: Multinodular change noted in the thyroid gland. No prevertebral soft tissue swelling. No evidence for gross hematoma in the spinal canal. Disc levels:  Loss of disc height seen at C3-4. Upper chest: Unremarkable. Other: None. IMPRESSION: 1. No acute intracranial abnormality. 2. Atrophy with chronic small vessel white matter ischemic disease. 3.  Degenerative changes in the cervical spine without fracture. Electronically Signed   By: Misty Stanley M.D.   On: 04/02/2017 13:45   Ct Knee Right Wo Contrast  Result Date: 04/02/2017 CLINICAL DATA:  Status post trip and fall today with onset of right knee pain and swelling. EXAM: CT OF THE RIGHT KNEE WITHOUT CONTRAST TECHNIQUE: Multidetector CT imaging of the right knee was performed according to the standard protocol. Multiplanar CT image reconstructions were also generated. COMPARISON:  Plain films right knee this same day. FINDINGS: Bones/Joint/Cartilage The patient has a mild impaction fracture of  the metaphysis of the right knee. There is no fragment override on the lateral side. Medially, there is fragment override of approximately 0.5 cm. The tibial plateaus are not disrupted. No other fracture is identified. Motion creates artifact in the mid to distal diaphysis of the tibia and fibula. Bones are markedly osteopenic. There is bone-on-bone lateral compartment joint space narrowing with subchondral sclerosis and osteophytosis. Loose body in the patellofemoral compartment measures 2.9 cm craniocaudal by 2.3 cm AP x 1.3 cm transverse. Ligaments Suboptimally assessed by CT. Muscles and Tendons Mildly atrophic.  No focal abnormality. Soft tissues Subcutaneous edema is noted. IMPRESSION: Mildly impacted metaphyseal fracture of the tibia. No disruption of the medial or lateral tibial plateau. Osteopenia. Osteoarthritis appearing worst in the lateral compartment where it is severe. Large loose body in the patellofemoral compartment is noted. Electronically Signed   By: Inge Rise M.D.   On: 04/02/2017 14:53   Dg Knee Complete 4 Views Right  Result Date: 04/02/2017 CLINICAL DATA:  Fall, pain, swelling EXAM: RIGHT KNEE - COMPLETE 4+ VIEW COMPARISON:  None FINDINGS: There are advanced tricompartment degenerative changes with near complete joint space loss in the medial and patellofemoral compartments.  Chondrocalcinosis in the lateral compartment. Large intra-articular loose body in the suprapatellar bursa. Diffuse osteopenia. Questionable avulsed fragment off the anterior cortex of the proximal tibia seen only on the oblique view. Recommend correlation for pain over the proximal tibia. IMPRESSION: Advanced degenerative joint disease changes, chondrocalcinosis and large intraarticular loose body in the suprapatellar bursa. Question cortical avulsion off the anterior proximal tibia noted on only an oblique view. Electronically Signed   By: Rolm Baptise M.D.   On: 04/02/2017 13:18   Dg Femur, Min 2 Views Right  Result Date: 04/02/2017 CLINICAL DATA:  Severe leg pain.  Status post fall. EXAM: RIGHT FEMUR 2 VIEWS COMPARISON:  Pelvis x-ray 01/21/2016 FINDINGS: Two-view exam shows the patient be status post a right hip hemiarthroplasty. Bones are markedly demineralized. No evidence for periprosthetic femur fracture. No dislocation of the femoral component. No worrisome lytic or sclerotic osseous abnormality. Degenerative changes are evident at the knee. Probable intra-articular loose body in the suprapatellar bursa. IMPRESSION: 1. Status post right hip hemiarthroplasty without evidence for periprosthetic right femur fracture. 2. Marked osteopenia. 3. Degenerative changes in the knee with intra-articular loose body overlying the suprapatellar bursa. Electronically Signed   By: Misty Stanley M.D.   On: 04/02/2017 13:36    Procedures .Marland KitchenLaceration Repair Date/Time: 04/02/2017 1:00 PM Performed by: Sharlett Iles Authorized by: Sharlett Iles   Consent:    Consent obtained:  Verbal   Consent given by:  Patient Anesthesia (see MAR for exact dosages):    Anesthesia method:  None Laceration details:    Location:  Scalp   Scalp location:  L parietal   Length (cm):  2 Repair type:    Repair type:  Simple Treatment:    Area cleansed with:  Betadine   Amount of cleaning:  Standard   Irrigation  solution:  Sterile saline   Irrigation volume:  100 Skin repair:    Repair method:  Staples   Number of staples:  2 Approximation:    Approximation:  Close Post-procedure details:    Dressing:  Open (no dressing)   (including critical care time)  Medications Ordered in ED Medications  Tdap (BOOSTRIX) injection 0.5 mL (0.5 mLs Intramuscular Given 04/02/17 1337)  HYDROcodone-acetaminophen (NORCO/VICODIN) 5-325 MG per tablet 1 tablet (1 tablet Oral Given 04/02/17 1346)  HYDROcodone-acetaminophen (  NORCO/VICODIN) 5-325 MG per tablet 1 tablet (1 tablet Oral Given 04/02/17 1440)     Initial Impression / Assessment and Plan / ED Course  I have reviewed the triage vital signs and the nursing notes.  Pertinent labs & imaging results that were available during my care of the patient were reviewed by me and considered in my medical decision making (see chart for details).     PT w/ mechanical fall, L scalp lac and R leg pain. Neurologically intact on exam. Repaired lac at bedside, see procedure note. Updated tetanus as last documented in chart was 2011. Obtained CT head, c-spine as well as plain films of R leg.   CT head and C-spine negative acute. Plain films of leg show possible proximal tibia fracture. Obtained CT for better evaluation. CT confirms metaphyseal tibia fracture, mildly impacted. No involvement of tibial plateau. I discussed with orthopedics, Dr. Marlou Sa, who looked at imaging and recommended knee immobilizer, NWB, and f/u tomorrow in clinic. I ordered knee immobilizer, patient already has wheelchair and walker at home. Counseled on strict nonweightbearing. Provided with Vicodin to use for pain. Daughter reported foul smelling urine and requested a UA. UA shows small leukocytes, many bacteria, some WBCs. I explained that this is equivocal for infection, U culture sent. I offered to start abx given h/o UTIs and foul smell, but daughter declined stating that she wanted to wait on culture and  follow-up with PCP. Extensively reviewed return precautions including fever, confusion, or lethargy. Daughter voiced understanding of treatment plan and patient discharged in satisfactory condition. Final Clinical Impressions(s) / ED Diagnoses   Final diagnoses:  Pain in right lower leg  Closed fracture of proximal end of right tibia, unspecified fracture morphology, initial encounter  Laceration of scalp, initial encounter    New Prescriptions New Prescriptions   DOCUSATE SODIUM (COLACE) 100 MG CAPSULE    Take 1 capsule (100 mg total) by mouth 2 (two) times daily as needed for mild constipation.   HYDROCODONE-ACETAMINOPHEN (NORCO/VICODIN) 5-325 MG TABLET    Take 1-2 tablets by mouth every 6 (six) hours as needed for severe pain.     Tauriel Scronce, Wenda Overland, MD 04/02/17 [phone]

## 2017-04-02 NOTE — ED Notes (Signed)
Bed: WP79 Expected date:  Expected time:  Means of arrival:  Comments: 83 yo fall, knee pain

## 2017-04-02 NOTE — ED Notes (Signed)
[facility] called  

## 2017-04-02 NOTE — Discharge Instructions (Signed)
FOLLOW UP WITH PRIMARY CARE PROVIDER IN 1 WEEK FOR STAPLE REMOVAL. CONTACT [facility] FOR APPOINTMENT TOMORROW. RETURN TO ER IF ANY CONFUSION, FEVER, LETHARGY OR WORSENING URINARY SYMPTOMS.

## 2017-04-03 ENCOUNTER — Encounter (INDEPENDENT_AMBULATORY_CARE_PROVIDER_SITE_OTHER): Payer: Self-pay | Admitting: Orthopedic Surgery

## 2017-04-03 ENCOUNTER — Ambulatory Visit (INDEPENDENT_AMBULATORY_CARE_PROVIDER_SITE_OTHER): Payer: Medicare Other | Admitting: Orthopedic Surgery

## 2017-04-03 ENCOUNTER — Observation Stay (HOSPITAL_COMMUNITY)
Admission: EM | Admit: 2017-04-03 | Discharge: 2017-04-04 | Disposition: A | Discharge status: Skilled Nursing Facility | Payer: Medicare Other | Attending: Internal Medicine | Admitting: Internal Medicine

## 2017-04-03 ENCOUNTER — Encounter (HOSPITAL_COMMUNITY): Payer: Self-pay | Admitting: Emergency Medicine

## 2017-04-03 ENCOUNTER — Ambulatory Visit (INDEPENDENT_AMBULATORY_CARE_PROVIDER_SITE_OTHER): Payer: Medicare Other

## 2017-04-03 DIAGNOSIS — K3184 Gastroparesis: Secondary | ICD-10-CM | POA: Diagnosis not present

## 2017-04-03 DIAGNOSIS — I712 Thoracic aortic aneurysm, without rupture: Secondary | ICD-10-CM | POA: Insufficient documentation

## 2017-04-03 DIAGNOSIS — E785 Hyperlipidemia, unspecified: Secondary | ICD-10-CM | POA: Insufficient documentation

## 2017-04-03 DIAGNOSIS — D696 Thrombocytopenia, unspecified: Secondary | ICD-10-CM | POA: Diagnosis not present

## 2017-04-03 DIAGNOSIS — Z86718 Personal history of other venous thrombosis and embolism: Secondary | ICD-10-CM | POA: Insufficient documentation

## 2017-04-03 DIAGNOSIS — Z85028 Personal history of other malignant neoplasm of stomach: Secondary | ICD-10-CM | POA: Insufficient documentation

## 2017-04-03 DIAGNOSIS — E1151 Type 2 diabetes mellitus with diabetic peripheral angiopathy without gangrene: Secondary | ICD-10-CM | POA: Insufficient documentation

## 2017-04-03 DIAGNOSIS — D649 Anemia, unspecified: Secondary | ICD-10-CM | POA: Insufficient documentation

## 2017-04-03 DIAGNOSIS — F0280 Dementia in other diseases classified elsewhere without behavioral disturbance: Secondary | ICD-10-CM | POA: Diagnosis not present

## 2017-04-03 DIAGNOSIS — I10 Essential (primary) hypertension: Secondary | ICD-10-CM | POA: Diagnosis not present

## 2017-04-03 DIAGNOSIS — S82201A Unspecified fracture of shaft of right tibia, initial encounter for closed fracture: Secondary | ICD-10-CM

## 2017-04-03 DIAGNOSIS — E1142 Type 2 diabetes mellitus with diabetic polyneuropathy: Secondary | ICD-10-CM | POA: Insufficient documentation

## 2017-04-03 DIAGNOSIS — R911 Solitary pulmonary nodule: Secondary | ICD-10-CM | POA: Diagnosis not present

## 2017-04-03 DIAGNOSIS — S82191A Other fracture of upper end of right tibia, initial encounter for closed fracture: Secondary | ICD-10-CM

## 2017-04-03 DIAGNOSIS — R262 Difficulty in walking, not elsewhere classified: Secondary | ICD-10-CM | POA: Diagnosis not present

## 2017-04-03 DIAGNOSIS — I7025 Atherosclerosis of native arteries of other extremities with ulceration: Secondary | ICD-10-CM | POA: Insufficient documentation

## 2017-04-03 DIAGNOSIS — Z66 Do not resuscitate: Secondary | ICD-10-CM | POA: Insufficient documentation

## 2017-04-03 DIAGNOSIS — N3000 Acute cystitis without hematuria: Secondary | ICD-10-CM | POA: Insufficient documentation

## 2017-04-03 DIAGNOSIS — W010XXA Fall on same level from slipping, tripping and stumbling without subsequent striking against object, initial encounter: Secondary | ICD-10-CM | POA: Diagnosis not present

## 2017-04-03 DIAGNOSIS — Z885 Allergy status to narcotic agent status: Secondary | ICD-10-CM | POA: Insufficient documentation

## 2017-04-03 DIAGNOSIS — G309 Alzheimer's disease, unspecified: Secondary | ICD-10-CM | POA: Diagnosis not present

## 2017-04-03 DIAGNOSIS — E1143 Type 2 diabetes mellitus with diabetic autonomic (poly)neuropathy: Secondary | ICD-10-CM | POA: Insufficient documentation

## 2017-04-03 DIAGNOSIS — K219 Gastro-esophageal reflux disease without esophagitis: Secondary | ICD-10-CM | POA: Diagnosis not present

## 2017-04-03 DIAGNOSIS — G894 Chronic pain syndrome: Secondary | ICD-10-CM | POA: Diagnosis not present

## 2017-04-03 DIAGNOSIS — S82209A Unspecified fracture of shaft of unspecified tibia, initial encounter for closed fracture: Secondary | ICD-10-CM

## 2017-04-03 DIAGNOSIS — Z880 Allergy status to penicillin: Secondary | ICD-10-CM | POA: Insufficient documentation

## 2017-04-03 DIAGNOSIS — H409 Unspecified glaucoma: Secondary | ICD-10-CM | POA: Insufficient documentation

## 2017-04-03 DIAGNOSIS — Z87891 Personal history of nicotine dependence: Secondary | ICD-10-CM | POA: Insufficient documentation

## 2017-04-03 DIAGNOSIS — S82201D Unspecified fracture of shaft of right tibia, subsequent encounter for closed fracture with routine healing: Secondary | ICD-10-CM

## 2017-04-03 DIAGNOSIS — M1000 Idiopathic gout, unspecified site: Secondary | ICD-10-CM | POA: Diagnosis not present

## 2017-04-03 DIAGNOSIS — Z888 Allergy status to other drugs, medicaments and biological substances status: Secondary | ICD-10-CM | POA: Insufficient documentation

## 2017-04-03 DIAGNOSIS — Z79899 Other long term (current) drug therapy: Secondary | ICD-10-CM | POA: Insufficient documentation

## 2017-04-03 DIAGNOSIS — W19XXXA Unspecified fall, initial encounter: Secondary | ICD-10-CM | POA: Diagnosis present

## 2017-04-03 DIAGNOSIS — F329 Major depressive disorder, single episode, unspecified: Secondary | ICD-10-CM | POA: Diagnosis not present

## 2017-04-03 DIAGNOSIS — E876 Hypokalemia: Secondary | ICD-10-CM | POA: Insufficient documentation

## 2017-04-03 DIAGNOSIS — Z87440 Personal history of urinary (tract) infections: Secondary | ICD-10-CM | POA: Insufficient documentation

## 2017-04-03 HISTORY — DX: Unspecified fracture of shaft of unspecified tibia, initial encounter for closed fracture: S82.209A

## 2017-04-03 HISTORY — DX: Unspecified fracture of shaft of right tibia, initial encounter for closed fracture: S82.201A

## 2017-04-03 LAB — URINALYSIS, ROUTINE W REFLEX MICROSCOPIC
Bilirubin Urine: NEGATIVE
GLUCOSE, UA: NEGATIVE mg/dL
KETONES UR: NEGATIVE mg/dL
NITRITE: NEGATIVE
PROTEIN: 30 mg/dL — AB
Specific Gravity, Urine: 1.019 (ref 1.005–1.030)
pH: 5 (ref 5.0–8.0)

## 2017-04-03 LAB — CBC AND DIFFERENTIAL
HCT: 25 % — AB (ref 36–46)
HEMOGLOBIN: 8.6 g/dL — AB (ref 12.0–16.0)
Platelets: 100 10*3/uL — AB (ref 150–399)
WBC: 7.5 10^3/mL
WBC: 7.8 10*3/mL

## 2017-04-03 LAB — BASIC METABOLIC PANEL
Anion gap: 7 (ref 5–15)
BUN: 20 mg/dL (ref 4–21)
BUN: 21 mg/dL (ref 4–21)
BUN: 21 mg/dL — AB (ref 6–20)
CALCIUM: 8.6 mg/dL — AB (ref 8.9–10.3)
CO2: 28 mmol/L (ref 22–32)
CREATININE: 1.14 mg/dL — AB (ref 0.44–1.00)
Chloride: 110 mmol/L (ref 101–111)
Creatinine: 1.1 mg/dL (ref 0.5–1.1)
Creatinine: 1.1 mg/dL (ref 0.5–1.1)
GFR calc Af Amer: 48 mL/min — ABNORMAL LOW (ref >60)
GFR, EST NON AFRICAN AMERICAN: 42 mL/min — AB (ref >60)
GLUCOSE: 88 mg/dL
GLUCOSE: 88 mg/dL (ref 65–99)
Glucose: 82 mg/dL
POTASSIUM: 3.5 mmol/L (ref 3.4–5.3)
Potassium: 3.5 mmol/L (ref 3.5–5.1)
SODIUM: 145 mmol/L (ref 135–145)
Sodium: 140 mmol/L (ref 137–147)
Sodium: 145 mmol/L (ref 137–147)

## 2017-04-03 LAB — CBC WITH DIFFERENTIAL/PLATELET
BASOS PCT: 0 %
Basophils Absolute: 0 10*3/uL (ref 0.0–0.1)
EOS ABS: 0.2 10*3/uL (ref 0.0–0.7)
Eosinophils Relative: 2 %
HCT: 25.3 % — ABNORMAL LOW (ref 36.0–46.0)
Hemoglobin: 8.6 g/dL — ABNORMAL LOW (ref 12.0–15.0)
Lymphocytes Relative: 17 %
Lymphs Abs: 1.4 10*3/uL (ref 0.7–4.0)
MCH: 33.1 pg (ref 26.0–34.0)
MCHC: 34 g/dL (ref 30.0–36.0)
MCV: 97.3 fL (ref 78.0–100.0)
MONO ABS: 1 10*3/uL (ref 0.1–1.0)
Monocytes Relative: 13 %
Neutro Abs: 5.3 10*3/uL (ref 1.7–7.7)
Neutrophils Relative %: 67 %
Platelets: 100 10*3/uL — ABNORMAL LOW (ref 150–400)
RBC: 2.6 MIL/uL — ABNORMAL LOW (ref 3.87–5.11)
RDW: 14.5 % (ref 11.5–15.5)
WBC: 7.8 10*3/uL (ref 4.0–10.5)

## 2017-04-03 LAB — POC OCCULT BLOOD, ED: Fecal Occult Bld: POSITIVE — AB

## 2017-04-03 MED ORDER — ACETAMINOPHEN 325 MG PO TABS
650.0000 mg | ORAL_TABLET | Freq: Four times a day (QID) | ORAL | Status: DC | PRN
  Administered 2017-04-04: 650 mg via ORAL
  Filled 2017-04-03: qty 2

## 2017-04-03 MED ORDER — HYDROCODONE-ACETAMINOPHEN 5-325 MG PO TABS
1.0000 | ORAL_TABLET | Freq: Four times a day (QID) | ORAL | Status: DC | PRN
  Administered 2017-04-04: 2 via ORAL
  Filled 2017-04-03: qty 2

## 2017-04-03 MED ORDER — BOOST PLUS PO LIQD
237.0000 mL | Freq: Three times a day (TID) | ORAL | Status: DC
  Administered 2017-04-04 (×2): 237 mL via ORAL
  Filled 2017-04-03 (×3): qty 237

## 2017-04-03 MED ORDER — DONEPEZIL HCL 5 MG PO TABS
5.0000 mg | ORAL_TABLET | Freq: Every day | ORAL | Status: DC
  Administered 2017-04-04: 5 mg via ORAL
  Filled 2017-04-03: qty 1

## 2017-04-03 MED ORDER — ADULT MULTIVITAMIN W/MINERALS CH
1.0000 | ORAL_TABLET | Freq: Every day | ORAL | Status: DC
  Administered 2017-04-04: 1 via ORAL
  Filled 2017-04-03 (×2): qty 1

## 2017-04-03 MED ORDER — ONDANSETRON HCL 4 MG/2ML IJ SOLN: 4.0000 mg | Freq: Four times a day (QID) | INTRAMUSCULAR | Status: DC | PRN

## 2017-04-03 MED ORDER — ONDANSETRON HCL 4 MG PO TABS: 4.0000 mg | ORAL_TABLET | Freq: Four times a day (QID) | ORAL | Status: DC | PRN

## 2017-04-03 MED ORDER — LATANOPROST 0.005 % OP SOLN
1.0000 [drp] | Freq: Every day | OPHTHALMIC | Status: DC
  Administered 2017-04-04: 1 [drp] via OPHTHALMIC
  Filled 2017-04-03: qty 2.5

## 2017-04-03 MED ORDER — DEXTROSE 5 % IV SOLN
1.0000 g | INTRAVENOUS | Status: DC
  Administered 2017-04-03: 1 g via INTRAVENOUS
  Filled 2017-04-03 (×2): qty 10

## 2017-04-03 MED ORDER — FEBUXOSTAT 40 MG PO TABS
40.0000 mg | ORAL_TABLET | Freq: Every day | ORAL | Status: DC
  Administered 2017-04-04: 40 mg via ORAL
  Filled 2017-04-03: qty 1

## 2017-04-03 MED ORDER — PANTOPRAZOLE SODIUM 40 MG PO TBEC
40.0000 mg | DELAYED_RELEASE_TABLET | Freq: Every day | ORAL | Status: DC
  Administered 2017-04-04: 40 mg via ORAL
  Filled 2017-04-03 (×2): qty 1

## 2017-04-03 MED ORDER — AMLODIPINE BESYLATE 5 MG PO TABS
5.0000 mg | ORAL_TABLET | Freq: Every day | ORAL | Status: DC
  Filled 2017-04-03: qty 1

## 2017-04-03 MED ORDER — ACETAMINOPHEN 650 MG RE SUPP: 650.0000 mg | Freq: Four times a day (QID) | RECTAL | Status: DC | PRN

## 2017-04-03 MED ORDER — MEMANTINE HCL ER 28 MG PO CP24
28.0000 mg | ORAL_CAPSULE | Freq: Every day | ORAL | Status: DC
Start: 2017-04-04 — End: 2017-04-04
  Administered 2017-04-04: 28 mg via ORAL
  Filled 2017-04-03: qty 1

## 2017-04-03 MED ORDER — COLCHICINE 0.6 MG PO TABS
0.6000 mg | ORAL_TABLET | Freq: Every day | ORAL | Status: DC
  Administered 2017-04-04: 0.6 mg via ORAL
  Filled 2017-04-03 (×2): qty 1

## 2017-04-03 NOTE — ED Notes (Signed)
In and out cath insertion attempted once by [facility] and once by [facility], both unsuccessful.  Made Raquel Sarna PA aware that both attempts were unsuccessful and there was an in and out cath done yesterday when patient was in out department, so does another repeat in and out need to be done.   Was told that patient was sent to ED from Ortho office for placement due to patient not able to perform ADLs at home.  Patient had abnormal UA yesterday so UA needs to be repeated.

## 2017-04-03 NOTE — ED Notes (Signed)
Pt. Documented in error Consult to social work.

## 2017-04-03 NOTE — H&P (Addendum)
History and Physical    Shari Mcmahon PJA:[id number] DOB: 1927/02/02 DOA: 04/03/2017  PCP: Biagio Borg, MD  Patient coming from: Home.  Chief Complaint: Fall with right tibial fracture.  HPI: Shari Mcmahon is a 83 y.o. female with history of dementia, hypertension, chronic anemia was brought to the ER 24 hours ago after patient had a mechanical fall at home. Patient usually uses a walker with assistance from patient's daughter to ambulate. Patient lost balance and fell at home 24 hours ago and was brought to the ER. At that time CT head C-spine was negative for anything acute. CT of the right knee showed right tibial fracture. Patient was referred to orthopedic surgeon Dr. Sharol Given after knee immobilizer was placed. At Dr. Jess Barters office patient was found to be having difficulty ambulating and was referred to back to ER for placement.   ED Course: In the ER labs reveals anemia and thrombocytopenia which is chronic. Patient also has UTI for which patient is placed on ceftriaxone. Patient is being admitted for further observation and probably will need rehabilitation placement. Since patient has anemia stool for occult blood was done which is positive.  Review of Systems: As per HPI, rest all negative.   Past Medical History:  Diagnosis Date  . ABDOMINAL PAIN, CHRONIC 04/07/2008  . Aneurysm of thoracic aorta ([place])    "found 03/2015; not big enough to repair" (07/07/2016)  . ARTHRITIS 03/03/2008  . Arthritis    "shoulders, knees, hands" (07/07/2016)  . Arthritis of knee, degenerative 10/19/2011  . Aspiration pneumonia ([place])   . BRADYCARDIA, CHRONIC 12/17/2008  . CARDIAC MURMUR 02/01/2010  . Complication of anesthesia    "she retains carbon dioxide; sleeps too long" (07/07/2016)  . CONSTIPATION, CHRONIC 10/15/2010  . DEGENERATIVE JOINT DISEASE, KNEES, BILATERAL 12/07/2007  . DEPRESSION 06/17/2009  . DIABETES MELLITUS, TYPE II 09/19/2007   DIET CONTROL   . DVT (deep venous thrombosis) ([facility]) BLE  .  DYSPHAGIA UNSPECIFIED 12/24/2009  . Gastroparesis 12/24/2009  . GENERALIZED OSTEOARTHROSIS INVOLVING HAND 10/03/2007  . GERD (gastroesophageal reflux disease)   . GLAUCOMA 09/19/2007  . GOUT 09/19/2007  . GOUTY ARTHROPATHY UNSPECIFIED 02/17/2009  . Hepatitis   . HYPERLIPIDEMIA 01/27/2010  . HYPERTENSION 09/19/2007  . LUNG NODULE 03/03/2008  . MENOPAUSAL DISORDER 12/17/2008  . PERIPHERAL NEUROPATHY 06/19/2008  . PERSONAL HISTORY MALIGNANT NEOPLASM STOMACH 09/19/2007  . 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 STOMACH CANCER 03/03/2008  . UTI 12/15/2009    Past Surgical History:  Procedure Laterality Date  . BALLOON DILATION N/A 07/12/2016   Procedure: BALLOON DILATION;  Surgeon: Manus Gunning, MD;  Location: [facility] ENDOSCOPY;  Service: Gastroenterology;  Laterality: N/A;  . BILROTH II PROCEDURE    . CATARACT EXTRACTION W/ INTRAOCULAR LENS  IMPLANT, BILATERAL    . COLONOSCOPY    . ESOPHAGOGASTRODUODENOSCOPY N/A 07/12/2016   Procedure: ESOPHAGOGASTRODUODENOSCOPY (EGD);  Surgeon: Manus Gunning, MD;  Location: [facility];  Service: Gastroenterology;  Laterality: N/A;  . FRACTURE SURGERY    . HIP FRACTURE SURGERY Right 03/2015     reports that she has quit smoking. Her smoking use included Cigarettes. She has a 12.50 pack-year smoking history. She has never used smokeless tobacco. She reports that she drinks alcohol. She reports that she does not use drugs.  Allergies  Allergen Reactions  . Penicillins Shortness Of Breath and Swelling    Has patient had a PCN reaction causing immediate rash, facial/tongue/throat swelling, SOB or lightheadedness with hypotension: yes  Has patient had a PCN reaction causing severe rash involving mucus membranes or skin necrosis: no Has patient had a PCN reaction that required hospitalization: unknown Has patient had a PCN reaction occurring within the last 10 years: no If all of the above answers are "NO", then may proceed with  Cephalosporin use.   . Voltaren [Diclofenac Sodium] Other (See Comments)    Stomach irritation  . Dulcolax [Bisacodyl]     Unknown   . Duloxetine Hcl Other (See Comments)    Made patient not want to eat or drink  . Oxycodone Other (See Comments)    hallucinations  . Timolol     REACTION: bradycardia worse    Family History  Problem Relation Age of Onset  . Uterine cancer Mother   . Stroke Father        Hemorrhagic  . Cancer Sister        breast cancer and one sister with uterine cancer and one sister with ovarian cancerr  . Colon cancer Maternal Grandmother   . Colon cancer Paternal Grandfather   . Diabetes Other   . Prostate cancer Brother   . Stomach cancer Neg Hx     Prior to Admission medications   Medication Sig Start Date End Date Taking? Authorizing Provider  amLODipine (NORVASC) 5 MG tablet TAKE 1 TABLET (5 MG TOTAL) BY MOUTH DAILY. 02/07/17  Yes Biagio Borg, MD  colchicine 0.6 MG tablet TAKE 1 TABLET (0.6 MG TOTAL) BY MOUTH DAILY AS NEEDED (GOUT PAIN.). Patient taking differently: Take 0.6 mg by mouth daily.  03/10/17  Yes Biagio Borg, MD  donepezil (ARICEPT) 10 MG tablet TAKE 1 TABLET (10 MG TOTAL) BY MOUTH AT BEDTIME. 03/15/17  Yes Biagio Borg, MD  HYDROcodone-acetaminophen (NORCO/VICODIN) 5-325 MG tablet Take 1-2 tablets by mouth every 6 (six) hours as needed for severe pain. 04/02/17  Yes Little, Wenda Overland, MD  lactose free nutrition (BOOST PLUS) LIQD Take 237 mLs by mouth 3 (three) times daily between meals. 01/29/16  Yes Hongalgi, Lenis Dickinson, MD  latanoprost (XALATAN) 0.005 % ophthalmic solution PLACE 1 DROP INTO BOTH EYES AT BEDTIME. 01/17/17  Yes Biagio Borg, MD  Multiple Vitamins-Minerals (MULTIVITAMIN WITH MINERALS) tablet Take 1 tablet by mouth daily.   Yes [provider]  NAMENDA XR 28 MG CP24 24 hr capsule TAKE 1 CAPSULE (28 MG TOTAL) BY MOUTH DAILY. 02/07/17  Yes Biagio Borg, MD  omeprazole (PRILOSEC) 40 MG capsule TAKE 1 CAPSULE (40 MG  TOTAL) BY MOUTH DAILY. Patient taking differently: Take 40 mg by mouth daily as needed (for acid reflux/indigestion).  02/17/17  Yes Biagio Borg, MD  ULORIC 40 MG tablet TAKE 1 TABLET (40 MG TOTAL) BY MOUTH DAILY. 02/17/17  Yes Biagio Borg, MD  docusate sodium (COLACE) 100 MG capsule Take 1 capsule (100 mg total) by mouth 2 (two) times daily as needed for mild constipation. Patient not taking: Reported on 04/03/2017 04/02/17   Little, Wenda Overland, MD    Physical Exam: Vitals:   04/03/17 2000 04/03/17 2100 04/03/17 2200 04/03/17 2201  BP: (!) 145/52 (!) 162/92 (!) 138/48 (!) 138/48  Pulse: (!) 56 70 (!) 57 73  Resp: 17  18 18   Temp:      TempSrc:      SpO2: 100% 100% 100% 100%  Weight:      Height:          Constitutional: Moderately built and nourished. Vitals:   04/03/17 2000 04/03/17  2100 04/03/17 2200 04/03/17 2201  BP: (!) 145/52 (!) 162/92 (!) 138/48 (!) 138/48  Pulse: (!) 56 70 (!) 57 73  Resp: 17  18 18   Temp:      TempSrc:      SpO2: 100% 100% 100% 100%  Weight:      Height:       Eyes: Anicteric. No pallor. ENMT: No discharge from the ears eyes nose or mouth. Neck: No mass felt. No neck rigidity. Respiratory: No rhonchi or crepitations. Cardiovascular: S1 and S2 heard no murmurs appreciated. Abdomen: Soft nontender bowel sounds present. Musculoskeletal: Right lower extremity in knee immobilizer. Skin: No rash. Skin appears warm. Neurologic: Alert awake oriented to time place and person. Moves all extremities. Psychiatric: Appears normal. Normal affect.   Labs on Admission: I have personally reviewed following labs and imaging studies  CBC:  Recent Labs Lab 04/03/17 1959  WBC 7.8  NEUTROABS 5.3  HGB 8.6*  HCT 25.3*  MCV 97.3  PLT 903*   Basic Metabolic Panel:  Recent Labs Lab 04/03/17 1959  NA 145  K 3.5  CL 110  CO2 28  GLUCOSE 88  BUN 21*  CREATININE 1.14*  CALCIUM 8.6*   GFR: Estimated Creatinine Clearance: 31 mL/min (A) (by C-G  formula based on SCr of 1.14 mg/dL (H)). Liver Function Tests: No results for input(s): AST, ALT, ALKPHOS, BILITOT, PROT, ALBUMIN in the last 168 hours. No results for input(s): LIPASE, AMYLASE in the last 168 hours. No results for input(s): AMMONIA in the last 168 hours. Coagulation Profile: No results for input(s): INR, PROTIME in the last 168 hours. Cardiac Enzymes: No results for input(s): CKTOTAL, CKMB, CKMBINDEX, TROPONINI in the last 168 hours. BNP (last 3 results) No results for input(s): PROBNP in the last 8760 hours. HbA1C: No results for input(s): HGBA1C in the last 72 hours. CBG: No results for input(s): GLUCAP in the last 168 hours. Lipid Profile: No results for input(s): CHOL, HDL, LDLCALC, TRIG, CHOLHDL, LDLDIRECT in the last 72 hours. Thyroid Function Tests: No results for input(s): TSH, T4TOTAL, FREET4, T3FREE, THYROIDAB in the last 72 hours. Anemia Panel: No results for input(s): VITAMINB12, FOLATE, FERRITIN, TIBC, IRON, RETICCTPCT in the last 72 hours. Urine analysis:    Component Value Date/Time   COLORURINE YELLOW 04/03/2017 1920   APPEARANCEUR CLOUDY (A) 04/03/2017 1920   LABSPEC 1.019 04/03/2017 1920   PHURINE 5.0 04/03/2017 1920   GLUCOSEU NEGATIVE 04/03/2017 1920   GLUCOSEU NEGATIVE 02/02/2017 1449   HGBUR SMALL (A) 04/03/2017 1920   BILIRUBINUR NEGATIVE 04/03/2017 1920   BILIRUBINUR negative 08/13/2014 1207   KETONESUR NEGATIVE 04/03/2017 1920   PROTEINUR 30 (A) 04/03/2017 1920   UROBILINOGEN 0.2 02/02/2017 1449   NITRITE NEGATIVE 04/03/2017 1920   LEUKOCYTESUR LARGE (A) 04/03/2017 1920   Sepsis Labs: @LABRCNTIP (procalcitonin:4,lacticidven:4) )No results found for this or any previous visit (from the past 240 hour(s)).   Radiological Exams on Admission: Dg Tibia/fibula Right  Result Date: 04/02/2017 CLINICAL DATA:  Severe leg pain after fall. EXAM: RIGHT TIBIA AND FIBULA - 2 VIEW COMPARISON:  None. FINDINGS: Two views study shows marked  demineralization. There is irregularity in the lateral tibial plateau stable in the interval and compatible with the degenerative change seen elsewhere in the lateral compartment. Chondrocalcinosis of the medial meniscus evident. No suspicious lytic or sclerotic osseous abnormality. IMPRESSION: Degenerative changes noted in the knee with diffuse demineralization. No acute bony abnormality. Ankle has not been well evaluated and if there is concern for ankle fracture,  dedicated ankle films recommended. Electronically Signed   By: Misty Stanley M.D.   On: 04/02/2017 13:39   Ct Head Wo Contrast  Result Date: 04/02/2017 CLINICAL DATA:  [place] and hit left side of head. EXAM: CT HEAD WITHOUT CONTRAST CT CERVICAL SPINE WITHOUT CONTRAST TECHNIQUE: Multidetector CT imaging of the head and cervical spine was performed following the standard protocol without intravenous contrast. Multiplanar CT image reconstructions of the cervical spine were also generated. COMPARISON:  07/07/2016 head CT FINDINGS: CT HEAD FINDINGS Brain: There is no evidence for acute hemorrhage, hydrocephalus, mass lesion, or abnormal extra-axial fluid collection. No definite CT evidence for acute infarction. Diffuse loss of parenchymal volume is consistent with atrophy. Patchy low attenuation in the deep hemispheric and periventricular white matter is nonspecific, but likely reflects chronic microvascular ischemic demyelination. Vascular: Atherosclerotic calcification is visualized in the intracranial segments of the internal carotid arteries. No dense MCA sign. Major dural sinuses are unremarkable. Skull: No evidence for fracture. No worrisome lytic or sclerotic lesion. Sinuses/Orbits: The visualized paranasal sinuses and mastoid air cells are clear. Visualized portions of the globes and intraorbital fat are unremarkable. Other: Staples identified in the left parietal scalp. CT CERVICAL SPINE FINDINGS Alignment: Straightening of normal cervical lordosis.  No subluxation. Skull base and vertebrae: No acute fracture. No primary bone lesion or focal pathologic process. Soft tissues and spinal canal: Multinodular change noted in the thyroid gland. No prevertebral soft tissue swelling. No evidence for gross hematoma in the spinal canal. Disc levels:  Loss of disc height seen at C3-4. Upper chest: Unremarkable. Other: None. IMPRESSION: 1. No acute intracranial abnormality. 2. Atrophy with chronic small vessel white matter ischemic disease. 3. Degenerative changes in the cervical spine without fracture. Electronically Signed   By: Misty Stanley M.D.   On: 04/02/2017 13:45   Ct Cervical Spine Wo Contrast  Result Date: 04/02/2017 CLINICAL DATA:  [place] and hit left side of head. EXAM: CT HEAD WITHOUT CONTRAST CT CERVICAL SPINE WITHOUT CONTRAST TECHNIQUE: Multidetector CT imaging of the head and cervical spine was performed following the standard protocol without intravenous contrast. Multiplanar CT image reconstructions of the cervical spine were also generated. COMPARISON:  07/07/2016 head CT FINDINGS: CT HEAD FINDINGS Brain: There is no evidence for acute hemorrhage, hydrocephalus, mass lesion, or abnormal extra-axial fluid collection. No definite CT evidence for acute infarction. Diffuse loss of parenchymal volume is consistent with atrophy. Patchy low attenuation in the deep hemispheric and periventricular white matter is nonspecific, but likely reflects chronic microvascular ischemic demyelination. Vascular: Atherosclerotic calcification is visualized in the intracranial segments of the internal carotid arteries. No dense MCA sign. Major dural sinuses are unremarkable. Skull: No evidence for fracture. No worrisome lytic or sclerotic lesion. Sinuses/Orbits: The visualized paranasal sinuses and mastoid air cells are clear. Visualized portions of the globes and intraorbital fat are unremarkable. Other: Staples identified in the left parietal scalp. CT CERVICAL SPINE  FINDINGS Alignment: Straightening of normal cervical lordosis. No subluxation. Skull base and vertebrae: No acute fracture. No primary bone lesion or focal pathologic process. Soft tissues and spinal canal: Multinodular change noted in the thyroid gland. No prevertebral soft tissue swelling. No evidence for gross hematoma in the spinal canal. Disc levels:  Loss of disc height seen at C3-4. Upper chest: Unremarkable. Other: None. IMPRESSION: 1. No acute intracranial abnormality. 2. Atrophy with chronic small vessel white matter ischemic disease. 3. Degenerative changes in the cervical spine without fracture. Electronically Signed   By: Verda Cumins.D.  On: 04/02/2017 13:45   Ct Knee Right Wo Contrast  Result Date: 04/02/2017 CLINICAL DATA:  Status post trip and fall today with onset of right knee pain and swelling. EXAM: CT OF THE RIGHT KNEE WITHOUT CONTRAST TECHNIQUE: Multidetector CT imaging of the right knee was performed according to the standard protocol. Multiplanar CT image reconstructions were also generated. COMPARISON:  Plain films right knee this same day. FINDINGS: Bones/Joint/Cartilage The patient has a mild impaction fracture of the metaphysis of the right knee. There is no fragment override on the lateral side. Medially, there is fragment override of approximately 0.5 cm. The tibial plateaus are not disrupted. No other fracture is identified. Motion creates artifact in the mid to distal diaphysis of the tibia and fibula. Bones are markedly osteopenic. There is bone-on-bone lateral compartment joint space narrowing with subchondral sclerosis and osteophytosis. Loose body in the patellofemoral compartment measures 2.9 cm craniocaudal by 2.3 cm AP x 1.3 cm transverse. Ligaments Suboptimally assessed by CT. Muscles and Tendons Mildly atrophic.  No focal abnormality. Soft tissues Subcutaneous edema is noted. IMPRESSION: Mildly impacted metaphyseal fracture of the tibia. No disruption of the medial or  lateral tibial plateau. Osteopenia. Osteoarthritis appearing worst in the lateral compartment where it is severe. Large loose body in the patellofemoral compartment is noted. Electronically Signed   By: Inge Rise M.D.   On: 04/02/2017 14:53   Dg Knee Complete 4 Views Right  Result Date: 04/02/2017 CLINICAL DATA:  Fall, pain, swelling EXAM: RIGHT KNEE - COMPLETE 4+ VIEW COMPARISON:  None FINDINGS: There are advanced tricompartment degenerative changes with near complete joint space loss in the medial and patellofemoral compartments. Chondrocalcinosis in the lateral compartment. Large intra-articular loose body in the suprapatellar bursa. Diffuse osteopenia. Questionable avulsed fragment off the anterior cortex of the proximal tibia seen only on the oblique view. Recommend correlation for pain over the proximal tibia. IMPRESSION: Advanced degenerative joint disease changes, chondrocalcinosis and large intraarticular loose body in the suprapatellar bursa. Question cortical avulsion off the anterior proximal tibia noted on only an oblique view. Electronically Signed   By: Rolm Baptise M.D.   On: 04/02/2017 13:18   Dg Femur, Min 2 Views Right  Result Date: 04/02/2017 CLINICAL DATA:  Severe leg pain.  Status post fall. EXAM: RIGHT FEMUR 2 VIEWS COMPARISON:  Pelvis x-ray 01/21/2016 FINDINGS: Two-view exam shows the patient be status post a right hip hemiarthroplasty. Bones are markedly demineralized. No evidence for periprosthetic femur fracture. No dislocation of the femoral component. No worrisome lytic or sclerotic osseous abnormality. Degenerative changes are evident at the knee. Probable intra-articular loose body in the suprapatellar bursa. IMPRESSION: 1. Status post right hip hemiarthroplasty without evidence for periprosthetic right femur fracture. 2. Marked osteopenia. 3. Degenerative changes in the knee with intra-articular loose body overlying the suprapatellar bursa. Electronically Signed   By:  Misty Stanley M.D.   On: 04/02/2017 13:36     Assessment/Plan Principal Problem:   Closed right tibial fracture Active Problems:   Essential hypertension   Alzheimer's dementia   Fall   Tibial fracture    1. Fall with right tibial fracture and difficulty ambulating - patient was referred back to the ER by orthopedic surgery. Plan as of now is to manage nonsurgically. Please discuss with orthopedic surgeon in a.m. Requested consult for physical therapy and social work for placement. 2. UTI - on ceftriaxone. Follow urine cultures. 3. Chronic anemia and thrombocytopenia - with stool for occult blood positive closely follow CBC. If hemoglobin  falls less than 7 may need transfusion. 4. Hypertension on amlodipine. 5. Dementia on Namenda and Aricept. Continue. No acute issues. 6. History of gout on colchicine and Uloric.   DVT prophylaxis: SCDs since patient's stool for occult blood is positive. Code Status: DO NOT RESUSCITATE.  Family Communication: Patient's daughter.  Disposition Plan: To be determined.  Consults called: Social work and physical therapy.  Admission status: Observation.    Rise Patience MD [facility] Pager [phone].  If 7PM-7AM, please contact night-coverage [email or web address] Password [email or web address]  04/03/2017, 11:56 PM

## 2017-04-03 NOTE — ED Provider Notes (Signed)
[facility] DEPT Provider Note   CSN: [id number] Arrival date & time: 04/03/17  1620     History   Chief Complaint Chief Complaint  Patient presents with  . Fall    HPI Shari Mcmahon is a 83 y.o. female.  HPI   Pt with hx DM, dementia, heart murmur, DVT, HLD, HTN, arthritis with ED visit yesterday after fall revealing right metaphyseal tibia fracture, followed up with orthopedist today and returns "for admission."  Pt is unsure of who she saw in the office and what they said about why she was being admitted.  Pt lives with daughter and gets around with a wheelchair.  Reports pain in her right lower leg with radiation into the foot.  Also has chronic numbness in the bilateral feet from diabetes, worse in the right foot since the fall.  Does note she has some right side pain, dysuria, urinary frequency.  Urine culture remains pending from yesterday, pt and daughter declined abx prior to culture.    Level V caveat for dementia.    Past Medical History:  Diagnosis Date  . ABDOMINAL PAIN, CHRONIC 04/07/2008  . Aneurysm of thoracic aorta ([place])    "found 03/2015; not big enough to repair" (07/07/2016)  . ARTHRITIS 03/03/2008  . Arthritis    "shoulders, knees, hands" (07/07/2016)  . Arthritis of knee, degenerative 10/19/2011  . Aspiration pneumonia ([place])   . BRADYCARDIA, CHRONIC 12/17/2008  . CARDIAC MURMUR 02/01/2010  . Complication of anesthesia    "she retains carbon dioxide; sleeps too long" (07/07/2016)  . CONSTIPATION, CHRONIC 10/15/2010  . DEGENERATIVE JOINT DISEASE, KNEES, BILATERAL 12/07/2007  . DEPRESSION 06/17/2009  . DIABETES MELLITUS, TYPE II 09/19/2007   DIET CONTROL   . DVT (deep venous thrombosis) ([facility]) BLE  . DYSPHAGIA UNSPECIFIED 12/24/2009  . Gastroparesis 12/24/2009  . GENERALIZED OSTEOARTHROSIS INVOLVING HAND 10/03/2007  . GERD (gastroesophageal reflux disease)   . GLAUCOMA 09/19/2007  . GOUT 09/19/2007  . GOUTY ARTHROPATHY UNSPECIFIED 02/17/2009  . Hepatitis   .  HYPERLIPIDEMIA 01/27/2010  . HYPERTENSION 09/19/2007  . LUNG NODULE 03/03/2008  . MENOPAUSAL DISORDER 12/17/2008  . PERIPHERAL NEUROPATHY 06/19/2008  . PERSONAL HISTORY MALIGNANT NEOPLASM STOMACH 09/19/2007  . 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 STOMACH CANCER 03/03/2008  . UTI 12/15/2009    Patient Active Problem List   Diagnosis Date Noted  . Fall 04/03/2017  . Encounter for well adult exam with abnormal findings 02/02/2017  . Urinary incontinence 02/02/2017  . Seizure-like activity ([place])   . Syncope 07/07/2016  . Atherosclerosis of native arteries of the extremities with ulceration ([place]) 05/11/2016    Class: Chronic  . Weakness generalized   . Hypernatremia   . Aspiration pneumonia ([place])   . AKI (acute kidney injury) ([place])   . Ulcer of left heel and midfoot with necrosis of muscle ([place]) 01/17/2016  . Type II diabetes mellitus with peripheral circulatory disorder ([place]) 01/01/2016  . Pressure ulcer 01/01/2016  . Peripheral autonomic neuropathy due to diabetes mellitus ([place]) 11/15/2015  . GERD without esophagitis 10/30/2015  . Alzheimer's dementia 07/28/2015  . Fracture of right femur ([place]) 07/28/2015  . Arthritis of knee 05/19/2015  . Anemia 02/25/2015  . Loss of weight 09/17/2014  . Chronic pain syndrome 06/16/2014  . Primary localized osteoarthrosis, lower leg 01/08/2014  . Rotator cuff tear arthropathy of both shoulders 12/09/2013  . CONSTIPATION, CHRONIC 10/15/2010  . Hyperlipidemia 01/27/2010  . Gastroparesis 12/24/2009  . Dysphagia 12/24/2009  . DEPRESSION 06/17/2009  .  BRADYCARDIA, CHRONIC 12/17/2008  . Peripheral neuropathy ([place]) 06/19/2008  . Lung nodules 03/03/2008  . GENERALIZED OSTEOARTHROSIS INVOLVING HAND 10/03/2007  . Gout 09/19/2007  . GLAUCOMA 09/19/2007  . Essential hypertension 09/19/2007    Past Surgical History:  Procedure Laterality Date  . BALLOON DILATION N/A 07/12/2016   Procedure: BALLOON DILATION;  Surgeon: Manus Gunning, MD;  Location: [facility] ENDOSCOPY;  Service: Gastroenterology;  Laterality: N/A;  . BILROTH II PROCEDURE    . CATARACT EXTRACTION W/ INTRAOCULAR LENS  IMPLANT, BILATERAL    . COLONOSCOPY    . ESOPHAGOGASTRODUODENOSCOPY N/A 07/12/2016   Procedure: ESOPHAGOGASTRODUODENOSCOPY (EGD);  Surgeon: Manus Gunning, MD;  Location: [facility];  Service: Gastroenterology;  Laterality: N/A;  . FRACTURE SURGERY    . HIP FRACTURE SURGERY Right 03/2015    OB History    No data available       Home Medications    Prior to Admission medications   Medication Sig Start Date End Date Taking? Authorizing Provider  amLODipine (NORVASC) 5 MG tablet TAKE 1 TABLET (5 MG TOTAL) BY MOUTH DAILY. 02/07/17  Yes Biagio Borg, MD  colchicine 0.6 MG tablet TAKE 1 TABLET (0.6 MG TOTAL) BY MOUTH DAILY AS NEEDED (GOUT PAIN.). Patient taking differently: Take 0.6 mg by mouth daily.  03/10/17  Yes Biagio Borg, MD  donepezil (ARICEPT) 10 MG tablet TAKE 1 TABLET (10 MG TOTAL) BY MOUTH AT BEDTIME. 03/15/17  Yes Biagio Borg, MD  HYDROcodone-acetaminophen (NORCO/VICODIN) 5-325 MG tablet Take 1-2 tablets by mouth every 6 (six) hours as needed for severe pain. 04/02/17  Yes Little, Wenda Overland, MD  lactose free nutrition (BOOST PLUS) LIQD Take 237 mLs by mouth 3 (three) times daily between meals. 01/29/16  Yes Hongalgi, Lenis Dickinson, MD  latanoprost (XALATAN) 0.005 % ophthalmic solution PLACE 1 DROP INTO BOTH EYES AT BEDTIME. 01/17/17  Yes Biagio Borg, MD  Multiple Vitamins-Minerals (MULTIVITAMIN WITH MINERALS) tablet Take 1 tablet by mouth daily.   Yes [provider]  NAMENDA XR 28 MG CP24 24 hr capsule TAKE 1 CAPSULE (28 MG TOTAL) BY MOUTH DAILY. 02/07/17  Yes Biagio Borg, MD  omeprazole (PRILOSEC) 40 MG capsule TAKE 1 CAPSULE (40 MG TOTAL) BY MOUTH DAILY. Patient taking differently: Take 40 mg by mouth daily as needed (for acid reflux/indigestion).  02/17/17  Yes Biagio Borg, MD  ULORIC 40 MG tablet TAKE  1 TABLET (40 MG TOTAL) BY MOUTH DAILY. 02/17/17  Yes Biagio Borg, MD  docusate sodium (COLACE) 100 MG capsule Take 1 capsule (100 mg total) by mouth 2 (two) times daily as needed for mild constipation. Patient not taking: Reported on 04/03/2017 04/02/17   Little, Wenda Overland, MD    Family History Family History  Problem Relation Age of Onset  . Uterine cancer Mother   . Stroke Father        Hemorrhagic  . Cancer Sister        breast cancer and one sister with uterine cancer and one sister with ovarian cancerr  . Colon cancer Maternal Grandmother   . Colon cancer Paternal Grandfather   . Diabetes Other   . Prostate cancer Brother   . Stomach cancer Neg Hx     Social History Social History  Substance Use Topics  . Smoking status: Former Smoker    Packs/day: 0.50    Years: 25.00    Types: Cigarettes  . Smokeless tobacco: Never Used     Comment: "stopped when she  was 83 years old"  . Alcohol use 0.0 oz/week     Comment: 07/07/2016 "nothing in years; stopped maybe in the  1980s"     Allergies   Penicillins; Voltaren [diclofenac sodium]; Dulcolax [bisacodyl]; Duloxetine hcl; Oxycodone; and Timolol   Review of Systems Review of Systems  Unable to perform ROS: Dementia     Physical Exam Updated Vital Signs BP (!) 138/48 (BP Location: Right Arm)   Pulse 73   Temp 98.1 F (36.7 C) (Oral)   Resp 18   Ht 5\' 6"  (1.676 m)   Wt 57.6 kg (127 lb)   SpO2 100%   BMI 20.50 kg/m   Physical Exam  Constitutional: She appears well-developed and well-nourished. No distress.  HENT:  Head: Normocephalic and atraumatic.  Neck: Neck supple.  Cardiovascular: Normal rate and regular rhythm.   Murmur heard. Pulmonary/Chest: Effort normal and breath sounds normal. No respiratory distress. She has no wheezes. She has no rales.  Abdominal: Soft. She exhibits no distension. There is no tenderness. There is no rebound and no guarding.  Genitourinary:  Genitourinary Comments: Rectum is  normal.  Stool is light beige.    Musculoskeletal:  Right leg in knee immobilizer.  Right foot edematous.  Unable to palpate definite pulse.  Moves toes.    Neurological: She is alert.  Skin: She is not diaphoretic.  Nursing note and vitals reviewed.    ED Treatments / Results  Labs (all labs ordered are listed, but only abnormal results are displayed) Labs Reviewed  URINALYSIS, ROUTINE W REFLEX MICROSCOPIC - Abnormal; Notable for the following:       Result Value   APPearance CLOUDY (*)    Hgb urine dipstick SMALL (*)    Protein, ur 30 (*)    Leukocytes, UA LARGE (*)    Bacteria, UA MANY (*)    Squamous Epithelial / LPF 0-5 (*)    All other components within normal limits  BASIC METABOLIC PANEL - Abnormal; Notable for the following:    BUN 21 (*)    Creatinine, Ser 1.14 (*)    Calcium 8.6 (*)    GFR calc non Af Amer 42 (*)    GFR calc Af Amer 48 (*)    All other components within normal limits  CBC WITH DIFFERENTIAL/PLATELET - Abnormal; Notable for the following:    RBC 2.60 (*)    Hemoglobin 8.6 (*)    HCT 25.3 (*)    Platelets 100 (*)    All other components within normal limits  POC OCCULT BLOOD, ED - Abnormal; Notable for the following:    Fecal Occult Bld POSITIVE (*)    All other components within normal limits  OCCULT BLOOD X 1 CARD TO LAB, STOOL    EKG  EKG Interpretation None       Radiology Dg Tibia/fibula Right  Result Date: 04/02/2017 CLINICAL DATA:  Severe leg pain after fall. EXAM: RIGHT TIBIA AND FIBULA - 2 VIEW COMPARISON:  None. FINDINGS: Two views study shows marked demineralization. There is irregularity in the lateral tibial plateau stable in the interval and compatible with the degenerative change seen elsewhere in the lateral compartment. Chondrocalcinosis of the medial meniscus evident. No suspicious lytic or sclerotic osseous abnormality. IMPRESSION: Degenerative changes noted in the knee with diffuse demineralization. No acute bony  abnormality. Ankle has not been well evaluated and if there is concern for ankle fracture, dedicated ankle films recommended. Electronically Signed   By: Misty Stanley M.D.   On:  04/02/2017 13:39   Ct Head Wo Contrast  Result Date: 04/02/2017 CLINICAL DATA:  [place] and hit left side of head. EXAM: CT HEAD WITHOUT CONTRAST CT CERVICAL SPINE WITHOUT CONTRAST TECHNIQUE: Multidetector CT imaging of the head and cervical spine was performed following the standard protocol without intravenous contrast. Multiplanar CT image reconstructions of the cervical spine were also generated. COMPARISON:  07/07/2016 head CT FINDINGS: CT HEAD FINDINGS Brain: There is no evidence for acute hemorrhage, hydrocephalus, mass lesion, or abnormal extra-axial fluid collection. No definite CT evidence for acute infarction. Diffuse loss of parenchymal volume is consistent with atrophy. Patchy low attenuation in the deep hemispheric and periventricular white matter is nonspecific, but likely reflects chronic microvascular ischemic demyelination. Vascular: Atherosclerotic calcification is visualized in the intracranial segments of the internal carotid arteries. No dense MCA sign. Major dural sinuses are unremarkable. Skull: No evidence for fracture. No worrisome lytic or sclerotic lesion. Sinuses/Orbits: The visualized paranasal sinuses and mastoid air cells are clear. Visualized portions of the globes and intraorbital fat are unremarkable. Other: Staples identified in the left parietal scalp. CT CERVICAL SPINE FINDINGS Alignment: Straightening of normal cervical lordosis. No subluxation. Skull base and vertebrae: No acute fracture. No primary bone lesion or focal pathologic process. Soft tissues and spinal canal: Multinodular change noted in the thyroid gland. No prevertebral soft tissue swelling. No evidence for gross hematoma in the spinal canal. Disc levels:  Loss of disc height seen at C3-4. Upper chest: Unremarkable. Other: None.  IMPRESSION: 1. No acute intracranial abnormality. 2. Atrophy with chronic small vessel white matter ischemic disease. 3. Degenerative changes in the cervical spine without fracture. Electronically Signed   By: Misty Stanley M.D.   On: 04/02/2017 13:45   Ct Cervical Spine Wo Contrast  Result Date: 04/02/2017 CLINICAL DATA:  [place] and hit left side of head. EXAM: CT HEAD WITHOUT CONTRAST CT CERVICAL SPINE WITHOUT CONTRAST TECHNIQUE: Multidetector CT imaging of the head and cervical spine was performed following the standard protocol without intravenous contrast. Multiplanar CT image reconstructions of the cervical spine were also generated. COMPARISON:  07/07/2016 head CT FINDINGS: CT HEAD FINDINGS Brain: There is no evidence for acute hemorrhage, hydrocephalus, mass lesion, or abnormal extra-axial fluid collection. No definite CT evidence for acute infarction. Diffuse loss of parenchymal volume is consistent with atrophy. Patchy low attenuation in the deep hemispheric and periventricular white matter is nonspecific, but likely reflects chronic microvascular ischemic demyelination. Vascular: Atherosclerotic calcification is visualized in the intracranial segments of the internal carotid arteries. No dense MCA sign. Major dural sinuses are unremarkable. Skull: No evidence for fracture. No worrisome lytic or sclerotic lesion. Sinuses/Orbits: The visualized paranasal sinuses and mastoid air cells are clear. Visualized portions of the globes and intraorbital fat are unremarkable. Other: Staples identified in the left parietal scalp. CT CERVICAL SPINE FINDINGS Alignment: Straightening of normal cervical lordosis. No subluxation. Skull base and vertebrae: No acute fracture. No primary bone lesion or focal pathologic process. Soft tissues and spinal canal: Multinodular change noted in the thyroid gland. No prevertebral soft tissue swelling. No evidence for gross hematoma in the spinal canal. Disc levels:  Loss of disc  height seen at C3-4. Upper chest: Unremarkable. Other: None. IMPRESSION: 1. No acute intracranial abnormality. 2. Atrophy with chronic small vessel white matter ischemic disease. 3. Degenerative changes in the cervical spine without fracture. Electronically Signed   By: Misty Stanley M.D.   On: 04/02/2017 13:45   Ct Knee Right Wo Contrast  Result Date: 04/02/2017 CLINICAL DATA:  Status post trip and fall today with onset of right knee pain and swelling. EXAM: CT OF THE RIGHT KNEE WITHOUT CONTRAST TECHNIQUE: Multidetector CT imaging of the right knee was performed according to the standard protocol. Multiplanar CT image reconstructions were also generated. COMPARISON:  Plain films right knee this same day. FINDINGS: Bones/Joint/Cartilage The patient has a mild impaction fracture of the metaphysis of the right knee. There is no fragment override on the lateral side. Medially, there is fragment override of approximately 0.5 cm. The tibial plateaus are not disrupted. No other fracture is identified. Motion creates artifact in the mid to distal diaphysis of the tibia and fibula. Bones are markedly osteopenic. There is bone-on-bone lateral compartment joint space narrowing with subchondral sclerosis and osteophytosis. Loose body in the patellofemoral compartment measures 2.9 cm craniocaudal by 2.3 cm AP x 1.3 cm transverse. Ligaments Suboptimally assessed by CT. Muscles and Tendons Mildly atrophic.  No focal abnormality. Soft tissues Subcutaneous edema is noted. IMPRESSION: Mildly impacted metaphyseal fracture of the tibia. No disruption of the medial or lateral tibial plateau. Osteopenia. Osteoarthritis appearing worst in the lateral compartment where it is severe. Large loose body in the patellofemoral compartment is noted. Electronically Signed   By: Inge Rise M.D.   On: 04/02/2017 14:53   Dg Knee Complete 4 Views Right  Result Date: 04/02/2017 CLINICAL DATA:  Fall, pain, swelling EXAM: RIGHT KNEE -  COMPLETE 4+ VIEW COMPARISON:  None FINDINGS: There are advanced tricompartment degenerative changes with near complete joint space loss in the medial and patellofemoral compartments. Chondrocalcinosis in the lateral compartment. Large intra-articular loose body in the suprapatellar bursa. Diffuse osteopenia. Questionable avulsed fragment off the anterior cortex of the proximal tibia seen only on the oblique view. Recommend correlation for pain over the proximal tibia. IMPRESSION: Advanced degenerative joint disease changes, chondrocalcinosis and large intraarticular loose body in the suprapatellar bursa. Question cortical avulsion off the anterior proximal tibia noted on only an oblique view. Electronically Signed   By: Rolm Baptise M.D.   On: 04/02/2017 13:18   Dg Femur, Min 2 Views Right  Result Date: 04/02/2017 CLINICAL DATA:  Severe leg pain.  Status post fall. EXAM: RIGHT FEMUR 2 VIEWS COMPARISON:  Pelvis x-ray 01/21/2016 FINDINGS: Two-view exam shows the patient be status post a right hip hemiarthroplasty. Bones are markedly demineralized. No evidence for periprosthetic femur fracture. No dislocation of the femoral component. No worrisome lytic or sclerotic osseous abnormality. Degenerative changes are evident at the knee. Probable intra-articular loose body in the suprapatellar bursa. IMPRESSION: 1. Status post right hip hemiarthroplasty without evidence for periprosthetic right femur fracture. 2. Marked osteopenia. 3. Degenerative changes in the knee with intra-articular loose body overlying the suprapatellar bursa. Electronically Signed   By: Misty Stanley M.D.   On: 04/02/2017 13:36    Procedures Procedures (including critical care time)  Medications Ordered in ED Medications  cefTRIAXone (ROCEPHIN) 1 g in dextrose 5 % 50 mL IVPB (0 g Intravenous Stopped 04/03/17 2111)     Initial Impression / Assessment and Plan / ED Course  I have reviewed the triage vital signs and the nursing  notes.  Pertinent labs & imaging results that were available during my care of the patient were reviewed by me and considered in my medical decision making (see chart for details).  Clinical Course as of Apr 03 2314  Mon Apr 03, 2017  1735 I spoke with Dr Sharol Given who is aware of the patient.  States pt saw Dr Marlou Sa  this morning.  The pt does not need surgery currently, but pt unable to perform ADLs and daughter unable to take care of her at home, will need SNF placement.    [EW]  2127 Admitted to [facility], Dr Hal Hope accepting.    [EW]    Clinical Course User Index [EW] Clayton Bibles, [place]   Pt with hx dementia with dysuria and malodorous urine, mechanical fall yesterday with tibial fracture.  UA appears infected.  Admitted to [facility], Dr Hal Hope accepting.    Final Clinical Impressions(s) / ED Diagnoses   Final diagnoses:  Closed fracture of shaft of right tibia with routine healing, unspecified fracture morphology, subsequent encounter  Acute cystitis without hematuria    New Prescriptions New Prescriptions   No medications on file     Clayton Bibles, Hershal Coria 04/03/17 2315    Little, Wenda Overland, MD 04/05/17 [phone]

## 2017-04-03 NOTE — ED Notes (Signed)
ED Provider at bedside. 

## 2017-04-03 NOTE — ED Triage Notes (Signed)
Per pt, states she fell yesterday-states she was seen and treated in this ED-states she saw ortho today and was told to come back for admission

## 2017-04-04 ENCOUNTER — Ambulatory Visit: Payer: Self-pay | Admitting: Internal Medicine

## 2017-04-04 DIAGNOSIS — H409 Unspecified glaucoma: Secondary | ICD-10-CM | POA: Diagnosis not present

## 2017-04-04 DIAGNOSIS — E1142 Type 2 diabetes mellitus with diabetic polyneuropathy: Secondary | ICD-10-CM | POA: Diagnosis not present

## 2017-04-04 DIAGNOSIS — D696 Thrombocytopenia, unspecified: Secondary | ICD-10-CM | POA: Diagnosis not present

## 2017-04-04 DIAGNOSIS — E1151 Type 2 diabetes mellitus with diabetic peripheral angiopathy without gangrene: Secondary | ICD-10-CM | POA: Diagnosis not present

## 2017-04-04 DIAGNOSIS — S82101D Unspecified fracture of upper end of right tibia, subsequent encounter for closed fracture with routine healing: Secondary | ICD-10-CM | POA: Diagnosis not present

## 2017-04-04 DIAGNOSIS — R488 Other symbolic dysfunctions: Secondary | ICD-10-CM | POA: Diagnosis not present

## 2017-04-04 DIAGNOSIS — D649 Anemia, unspecified: Secondary | ICD-10-CM | POA: Diagnosis not present

## 2017-04-04 DIAGNOSIS — E785 Hyperlipidemia, unspecified: Secondary | ICD-10-CM | POA: Diagnosis not present

## 2017-04-04 DIAGNOSIS — Z86718 Personal history of other venous thrombosis and embolism: Secondary | ICD-10-CM | POA: Diagnosis not present

## 2017-04-04 DIAGNOSIS — E1143 Type 2 diabetes mellitus with diabetic autonomic (poly)neuropathy: Secondary | ICD-10-CM | POA: Diagnosis not present

## 2017-04-04 DIAGNOSIS — N3000 Acute cystitis without hematuria: Secondary | ICD-10-CM

## 2017-04-04 DIAGNOSIS — M6281 Muscle weakness (generalized): Secondary | ICD-10-CM | POA: Diagnosis not present

## 2017-04-04 DIAGNOSIS — N390 Urinary tract infection, site not specified: Secondary | ICD-10-CM | POA: Diagnosis not present

## 2017-04-04 DIAGNOSIS — M1000 Idiopathic gout, unspecified site: Secondary | ICD-10-CM | POA: Diagnosis not present

## 2017-04-04 DIAGNOSIS — M109 Gout, unspecified: Secondary | ICD-10-CM | POA: Diagnosis not present

## 2017-04-04 DIAGNOSIS — I7025 Atherosclerosis of native arteries of other extremities with ulceration: Secondary | ICD-10-CM | POA: Diagnosis not present

## 2017-04-04 DIAGNOSIS — E876 Hypokalemia: Secondary | ICD-10-CM

## 2017-04-04 DIAGNOSIS — S82201A Unspecified fracture of shaft of right tibia, initial encounter for closed fracture: Secondary | ICD-10-CM | POA: Diagnosis not present

## 2017-04-04 DIAGNOSIS — F0280 Dementia in other diseases classified elsewhere without behavioral disturbance: Secondary | ICD-10-CM | POA: Diagnosis not present

## 2017-04-04 DIAGNOSIS — R262 Difficulty in walking, not elsewhere classified: Secondary | ICD-10-CM | POA: Diagnosis not present

## 2017-04-04 DIAGNOSIS — K3184 Gastroparesis: Secondary | ICD-10-CM | POA: Diagnosis not present

## 2017-04-04 DIAGNOSIS — G308 Other Alzheimer's disease: Secondary | ICD-10-CM | POA: Diagnosis not present

## 2017-04-04 DIAGNOSIS — G309 Alzheimer's disease, unspecified: Secondary | ICD-10-CM | POA: Diagnosis not present

## 2017-04-04 DIAGNOSIS — R911 Solitary pulmonary nodule: Secondary | ICD-10-CM | POA: Diagnosis not present

## 2017-04-04 DIAGNOSIS — Z66 Do not resuscitate: Secondary | ICD-10-CM | POA: Diagnosis not present

## 2017-04-04 DIAGNOSIS — I10 Essential (primary) hypertension: Secondary | ICD-10-CM | POA: Diagnosis not present

## 2017-04-04 DIAGNOSIS — R55 Syncope and collapse: Secondary | ICD-10-CM | POA: Diagnosis not present

## 2017-04-04 DIAGNOSIS — R2681 Unsteadiness on feet: Secondary | ICD-10-CM | POA: Diagnosis not present

## 2017-04-04 DIAGNOSIS — S8990XA Unspecified injury of unspecified lower leg, initial encounter: Secondary | ICD-10-CM | POA: Diagnosis not present

## 2017-04-04 DIAGNOSIS — S92909A Unspecified fracture of unspecified foot, initial encounter for closed fracture: Secondary | ICD-10-CM | POA: Diagnosis not present

## 2017-04-04 DIAGNOSIS — Z9181 History of falling: Secondary | ICD-10-CM | POA: Diagnosis not present

## 2017-04-04 DIAGNOSIS — I712 Thoracic aortic aneurysm, without rupture: Secondary | ICD-10-CM | POA: Diagnosis not present

## 2017-04-04 DIAGNOSIS — G894 Chronic pain syndrome: Secondary | ICD-10-CM | POA: Diagnosis not present

## 2017-04-04 DIAGNOSIS — S82201D Unspecified fracture of shaft of right tibia, subsequent encounter for closed fracture with routine healing: Secondary | ICD-10-CM | POA: Diagnosis not present

## 2017-04-04 DIAGNOSIS — K219 Gastro-esophageal reflux disease without esophagitis: Secondary | ICD-10-CM | POA: Diagnosis not present

## 2017-04-04 LAB — CBC
HCT: 26.6 % — ABNORMAL LOW (ref 36.0–46.0)
HCT: 24.2 % — ABNORMAL LOW (ref 36.0–46.0)
HEMOGLOBIN: 7.9 g/dL — AB (ref 12.0–15.0)
Hemoglobin: 8.9 g/dL — ABNORMAL LOW (ref 12.0–15.0)
MCH: 32.7 pg (ref 26.0–34.0)
MCH: 32 pg (ref 26.0–34.0)
MCHC: 32.6 g/dL (ref 30.0–36.0)
MCHC: 33.5 g/dL (ref 30.0–36.0)
MCV: 97.8 fL (ref 78.0–100.0)
MCV: 98 fL (ref 78.0–100.0)
PLATELETS: 106 10*3/uL — AB (ref 150–400)
PLATELETS: 88 10*3/uL — AB (ref 150–400)
RBC: 2.47 MIL/uL — AB (ref 3.87–5.11)
RBC: 2.72 MIL/uL — ABNORMAL LOW (ref 3.87–5.11)
RDW: 14.7 % (ref 11.5–15.5)
RDW: 14.7 % (ref 11.5–15.5)
WBC: 7.5 10*3/uL (ref 4.0–10.5)
WBC: 9.1 10*3/uL (ref 4.0–10.5)

## 2017-04-04 LAB — BASIC METABOLIC PANEL
Anion gap: 6 (ref 5–15)
BUN: 20 mg/dL (ref 4–21)
BUN: 20 mg/dL (ref 6–20)
CO2: 26 mmol/L (ref 22–32)
CREATININE: 1.11 mg/dL — AB (ref 0.44–1.00)
Calcium: 8.1 mg/dL — ABNORMAL LOW (ref 8.9–10.3)
Chloride: 108 mmol/L (ref 101–111)
Creatinine: 1.1 mg/dL (ref 0.5–1.1)
GFR calc Af Amer: 50 mL/min — ABNORMAL LOW (ref >60)
GFR, EST NON AFRICAN AMERICAN: 43 mL/min — AB (ref >60)
GLUCOSE: 82 mg/dL
GLUCOSE: 82 mg/dL (ref 65–99)
Potassium: 3.3 mmol/L — ABNORMAL LOW (ref 3.5–5.1)
SODIUM: 140 mmol/L (ref 135–145)
SODIUM: 140 mmol/L (ref 137–147)

## 2017-04-04 LAB — TYPE AND SCREEN
ABO/RH(D): A POS
ANTIBODY SCREEN: NEGATIVE

## 2017-04-04 LAB — CBC AND DIFFERENTIAL
HCT: 27 % — AB (ref 36–46)
Hemoglobin: 8.9 g/dL — AB (ref 12.0–16.0)
Platelets: 106 10*3/uL — AB (ref 150–399)
WBC: 7.5 10*3/mL
WBC: 9.1 10*3/mL

## 2017-04-04 MED ORDER — POTASSIUM CHLORIDE CRYS ER 20 MEQ PO TBCR
40.0000 meq | EXTENDED_RELEASE_TABLET | Freq: Once | ORAL | Status: AC
  Administered 2017-04-04: 40 meq via ORAL
  Filled 2017-04-04: qty 2

## 2017-04-04 MED ORDER — HYDROCODONE-ACETAMINOPHEN 5-325 MG PO TABS: 1.0000 | ORAL_TABLET | Freq: Four times a day (QID) | ORAL | Status: DC | PRN

## 2017-04-04 MED ORDER — ACETAMINOPHEN 325 MG PO TABS
650.0000 mg | ORAL_TABLET | Freq: Three times a day (TID) | ORAL | Status: DC
Start: 2017-04-04 — End: 2017-04-04

## 2017-04-04 MED ORDER — HYDROCODONE-ACETAMINOPHEN 5-325 MG PO TABS: 1.0000 | ORAL_TABLET | Freq: Four times a day (QID) | ORAL | 0 refills | Status: DC | PRN

## 2017-04-04 MED ORDER — CEFUROXIME AXETIL 250 MG PO TABS: 250.0000 mg | ORAL_TABLET | Freq: Two times a day (BID) | ORAL | 0 refills | Status: AC

## 2017-04-04 MED ORDER — METHOCARBAMOL 500 MG PO TABS
500.0000 mg | ORAL_TABLET | Freq: Three times a day (TID) | ORAL | Status: DC | PRN
  Administered 2017-04-04: 500 mg via ORAL
  Filled 2017-04-04: qty 1

## 2017-04-04 MED ORDER — ACETAMINOPHEN 650 MG RE SUPP
650.0000 mg | Freq: Three times a day (TID) | RECTAL | Status: DC
Start: 2017-04-04 — End: 2017-04-04

## 2017-04-04 MED ORDER — ACETAMINOPHEN 325 MG PO TABS: 650.0000 mg | ORAL_TABLET | Freq: Three times a day (TID) | ORAL | Status: AC

## 2017-04-04 NOTE — Discharge Summary (Signed)
Physician Discharge Summary  Shari Mcmahon:[id number] DOB: May 15, 1927 DOA: 04/03/2017  PCP: Biagio Borg, MD  Admit date: 04/03/2017 Discharge date: 04/04/2017  Time spent: 30 minutes  Recommendations for Outpatient Follow-up:  1. Repeat basic metabolic panel in 1 week to follow electrolytes and renal function 2. Repeat CBC in one week follow hemoglobin and platelets trend 3. Outpatient follow-up with orthopedic service   Discharge Diagnoses:  Principal Problem:   Closed right tibial fracture Active Problems:   Essential hypertension   Alzheimer's dementia   Fall   Hypokalemia   Discharge Condition: Stable. Patient discharged to a skilled nursing facility for further care and rehabilitation. Follow-up with PCP in 2 weeks.  Diet recommendation: Heart healthy  Filed Weights   04/03/17 1931 04/04/17 0044  Weight: 57.6 kg (127 lb) 57.7 kg (127 lb 3.3 oz)    History of present illness:  As per H&P written by Dr. Hal Hope on 04/04/17 83 y.o. female with history of dementia, hypertension, chronic anemia was brought to the ER 24 hours ago after patient had a mechanical fall at home. Patient usually uses a walker with assistance from patient's daughter to ambulate. Patient lost balance and fell at home 24 hours ago and was brought to the ER. At that time CT head C-spine was negative for anything acute. CT of the right knee showed right tibial fracture. Patient was referred to orthopedic surgeon Dr. Sharol Given after knee immobilizer was placed. At Dr. Jess Barters office patient was found to be having difficulty ambulating and was referred to back to ER for placement.   Hospital Course:  1-recent fall with right tibial fracture: -Patient seen by orthopedic surgery who recommended no surgical approach -Continue supportive care, as needed analgesics, no weightbearing and knee immobilizer -Patient with significant difficulty ambulating and performing activities of daily living. -Will be discharged  to a skilled nursing facility for further care and rehabilitation.  2-gram-negative rods UTI -Patient without fever, normal WBCs, no nausea, no vomiting -Reports an increased frequency but no frank dysuria -Has history of recurrent UTIs. -Will treat with 5 days of Ceftin -Final culture pending at discharge.  3-hypertension -Will continue amlodipine -Continue heart healthy diet  4-dementia secondary to Alzheimer disease without behavioral disturbances  -Will continue supportive care -Continue Aricept and Namenda    5-history of gout  -No acute flare  -Continue Uloric and Colchicine  6-history of chronic anemia from cytopenia -Overall stable -No signs of acute bleeding -Will recommend CBC to follow up hemoglobin and platelets trend  7-hypokalemia -Potassium repleted prior to discharge -We will recommend basic metabolic panel in about 1 week to reassess electrolytes trend  *The results of the patient's medical problems remains stable and the plan is to continue current medication regimen. She will follow with her PCP and further adjustment of her current medications to be done as needed during subsequent follow-up visit.  Procedures:  None  Consultations:  None  Discharge Exam: Vitals:   04/04/17 0742 04/04/17 1444  BP: (!) 110/30 112/61  Pulse:  (!) 51  Resp:  18  Temp: 98 F (36.7 C) 97.9 F (36.6 C)    General: Afebrile, no chest pain, no shortness of breath. Patient reports increased frequency but denies dysuria. No nausea, no vomiting. Cardiovascular: positive systolic murmur, no rubs, no gallops, S1 and S2 appreciated on exam.  Respiratory: good air movement bilaterally, no wheezing, no crackles Abdomen: Soft, nontender, nondistended, positive bowel sounds Extremities: No cyanosis, no clubbing, right lower extremities with knee immobilizer  in place; trace edema appreciated bilaterally.  Discharge Instructions   Discharge Instructions    Discharge  instructions    Complete by:  As directed    Maintain adequate hydration Use Tylenol 650 mg by mouth 3 times a day to assist with leg pain For severe pain use Vicodin 1 tablet every 6 hours as needed Repeat basic metabolic panel in 1 week to follow electrolytes and renal function Physical therapy and rehabilitation as per skilled nursing facility protocol (no weightbearing on her right lower extremity). Outpatient follow-up with Dr. Sharol Given (orthopedic service)     Current Discharge Medication List    START taking these medications   Details  acetaminophen (TYLENOL) 325 MG tablet Take 2 tablets (650 mg total) by mouth 3 (three) times daily.    cefUROXime (CEFTIN) 250 MG tablet Take 1 tablet (250 mg total) by mouth 2 (two) times daily. Qty: 10 tablet, Refills: 0      CONTINUE these medications which have CHANGED   Details  HYDROcodone-acetaminophen (NORCO/VICODIN) 5-325 MG tablet Take 1 tablet by mouth every 6 (six) hours as needed for severe pain. Qty: 15 tablet, Refills: 0      CONTINUE these medications which have NOT CHANGED   Details  amLODipine (NORVASC) 5 MG tablet TAKE 1 TABLET (5 MG TOTAL) BY MOUTH DAILY. Qty: 90 tablet, Refills: 3    colchicine 0.6 MG tablet TAKE 1 TABLET (0.6 MG TOTAL) BY MOUTH DAILY AS NEEDED (GOUT PAIN.). Qty: 30 tablet, Refills: 11   Associated Diagnoses: Idiopathic gout of right hand, unspecified chronicity    donepezil (ARICEPT) 10 MG tablet TAKE 1 TABLET (10 MG TOTAL) BY MOUTH AT BEDTIME. Qty: 90 tablet, Refills: 3    lactose free nutrition (BOOST PLUS) LIQD Take 237 mLs by mouth 3 (three) times daily between meals.    latanoprost (XALATAN) 0.005 % ophthalmic solution PLACE 1 DROP INTO BOTH EYES AT BEDTIME. Qty: 2.5 mL, Refills: 0    Multiple Vitamins-Minerals (MULTIVITAMIN WITH MINERALS) tablet Take 1 tablet by mouth daily.    NAMENDA XR 28 MG CP24 24 hr capsule TAKE 1 CAPSULE (28 MG TOTAL) BY MOUTH DAILY. Qty: 90 capsule, Refills: 3     omeprazole (PRILOSEC) 40 MG capsule TAKE 1 CAPSULE (40 MG TOTAL) BY MOUTH DAILY. Qty: 90 capsule, Refills: 3    ULORIC 40 MG tablet TAKE 1 TABLET (40 MG TOTAL) BY MOUTH DAILY. Qty: 90 tablet, Refills: 3    docusate sodium (COLACE) 100 MG capsule Take 1 capsule (100 mg total) by mouth 2 (two) times daily as needed for mild constipation. Qty: 30 capsule, Refills: 0       Allergies  Allergen Reactions  . Penicillins Shortness Of Breath and Swelling    Has patient had a PCN reaction causing immediate rash, facial/tongue/throat swelling, SOB or lightheadedness with hypotension: yes Has patient had a PCN reaction causing severe rash involving mucus membranes or skin necrosis: no Has patient had a PCN reaction that required hospitalization: unknown Has patient had a PCN reaction occurring within the last 10 years: no If all of the above answers are "NO", then may proceed with Cephalosporin use.   . Voltaren [Diclofenac Sodium] Other (See Comments)    Stomach irritation  . Dulcolax [Bisacodyl]     Unknown   . Duloxetine Hcl Other (See Comments)    Made patient not want to eat or drink  . Oxycodone Other (See Comments)    hallucinations  . Timolol     REACTION: bradycardia  worse   Follow-up Information    Biagio Borg, MD. Schedule an appointment as soon as possible for a visit in 2 week(s).   Specialties:  Internal Medicine, Radiology Contact information: [place] [address] [phone]           The results of significant diagnostics from this hospitalization (including imaging, microbiology, ancillary and laboratory) are listed below for reference.    Significant Diagnostic Studies: Dg Tibia/fibula Right  Result Date: 04/02/2017 CLINICAL DATA:  Severe leg pain after fall. EXAM: RIGHT TIBIA AND FIBULA - 2 VIEW COMPARISON:  None. FINDINGS: Two views study shows marked demineralization. There is irregularity in the lateral tibial plateau stable in  the interval and compatible with the degenerative change seen elsewhere in the lateral compartment. Chondrocalcinosis of the medial meniscus evident. No suspicious lytic or sclerotic osseous abnormality. IMPRESSION: Degenerative changes noted in the knee with diffuse demineralization. No acute bony abnormality. Ankle has not been well evaluated and if there is concern for ankle fracture, dedicated ankle films recommended. Electronically Signed   By: Misty Stanley M.D.   On: 04/02/2017 13:39   Ct Head Wo Contrast  Result Date: 04/02/2017 CLINICAL DATA:  [place] and hit left side of head. EXAM: CT HEAD WITHOUT CONTRAST CT CERVICAL SPINE WITHOUT CONTRAST TECHNIQUE: Multidetector CT imaging of the head and cervical spine was performed following the standard protocol without intravenous contrast. Multiplanar CT image reconstructions of the cervical spine were also generated. COMPARISON:  07/07/2016 head CT FINDINGS: CT HEAD FINDINGS Brain: There is no evidence for acute hemorrhage, hydrocephalus, mass lesion, or abnormal extra-axial fluid collection. No definite CT evidence for acute infarction. Diffuse loss of parenchymal volume is consistent with atrophy. Patchy low attenuation in the deep hemispheric and periventricular white matter is nonspecific, but likely reflects chronic microvascular ischemic demyelination. Vascular: Atherosclerotic calcification is visualized in the intracranial segments of the internal carotid arteries. No dense MCA sign. Major dural sinuses are unremarkable. Skull: No evidence for fracture. No worrisome lytic or sclerotic lesion. Sinuses/Orbits: The visualized paranasal sinuses and mastoid air cells are clear. Visualized portions of the globes and intraorbital fat are unremarkable. Other: Staples identified in the left parietal scalp. CT CERVICAL SPINE FINDINGS Alignment: Straightening of normal cervical lordosis. No subluxation. Skull base and vertebrae: No acute fracture. No primary bone  lesion or focal pathologic process. Soft tissues and spinal canal: Multinodular change noted in the thyroid gland. No prevertebral soft tissue swelling. No evidence for gross hematoma in the spinal canal. Disc levels:  Loss of disc height seen at C3-4. Upper chest: Unremarkable. Other: None. IMPRESSION: 1. No acute intracranial abnormality. 2. Atrophy with chronic small vessel white matter ischemic disease. 3. Degenerative changes in the cervical spine without fracture. Electronically Signed   By: Misty Stanley M.D.   On: 04/02/2017 13:45   Ct Cervical Spine Wo Contrast  Result Date: 04/02/2017 CLINICAL DATA:  [place] and hit left side of head. EXAM: CT HEAD WITHOUT CONTRAST CT CERVICAL SPINE WITHOUT CONTRAST TECHNIQUE: Multidetector CT imaging of the head and cervical spine was performed following the standard protocol without intravenous contrast. Multiplanar CT image reconstructions of the cervical spine were also generated. COMPARISON:  07/07/2016 head CT FINDINGS: CT HEAD FINDINGS Brain: There is no evidence for acute hemorrhage, hydrocephalus, mass lesion, or abnormal extra-axial fluid collection. No definite CT evidence for acute infarction. Diffuse loss of parenchymal volume is consistent with atrophy. Patchy low attenuation in the deep hemispheric and periventricular white  matter is nonspecific, but likely reflects chronic microvascular ischemic demyelination. Vascular: Atherosclerotic calcification is visualized in the intracranial segments of the internal carotid arteries. No dense MCA sign. Major dural sinuses are unremarkable. Skull: No evidence for fracture. No worrisome lytic or sclerotic lesion. Sinuses/Orbits: The visualized paranasal sinuses and mastoid air cells are clear. Visualized portions of the globes and intraorbital fat are unremarkable. Other: Staples identified in the left parietal scalp. CT CERVICAL SPINE FINDINGS Alignment: Straightening of normal cervical lordosis. No subluxation.  Skull base and vertebrae: No acute fracture. No primary bone lesion or focal pathologic process. Soft tissues and spinal canal: Multinodular change noted in the thyroid gland. No prevertebral soft tissue swelling. No evidence for gross hematoma in the spinal canal. Disc levels:  Loss of disc height seen at C3-4. Upper chest: Unremarkable. Other: None. IMPRESSION: 1. No acute intracranial abnormality. 2. Atrophy with chronic small vessel white matter ischemic disease. 3. Degenerative changes in the cervical spine without fracture. Electronically Signed   By: Misty Stanley M.D.   On: 04/02/2017 13:45   Ct Knee Right Wo Contrast  Result Date: 04/02/2017 CLINICAL DATA:  Status post trip and fall today with onset of right knee pain and swelling. EXAM: CT OF THE RIGHT KNEE WITHOUT CONTRAST TECHNIQUE: Multidetector CT imaging of the right knee was performed according to the standard protocol. Multiplanar CT image reconstructions were also generated. COMPARISON:  Plain films right knee this same day. FINDINGS: Bones/Joint/Cartilage The patient has a mild impaction fracture of the metaphysis of the right knee. There is no fragment override on the lateral side. Medially, there is fragment override of approximately 0.5 cm. The tibial plateaus are not disrupted. No other fracture is identified. Motion creates artifact in the mid to distal diaphysis of the tibia and fibula. Bones are markedly osteopenic. There is bone-on-bone lateral compartment joint space narrowing with subchondral sclerosis and osteophytosis. Loose body in the patellofemoral compartment measures 2.9 cm craniocaudal by 2.3 cm AP x 1.3 cm transverse. Ligaments Suboptimally assessed by CT. Muscles and Tendons Mildly atrophic.  No focal abnormality. Soft tissues Subcutaneous edema is noted. IMPRESSION: Mildly impacted metaphyseal fracture of the tibia. No disruption of the medial or lateral tibial plateau. Osteopenia. Osteoarthritis appearing worst in the  lateral compartment where it is severe. Large loose body in the patellofemoral compartment is noted. Electronically Signed   By: Inge Rise M.D.   On: 04/02/2017 14:53   Dg Knee Complete 4 Views Right  Result Date: 04/02/2017 CLINICAL DATA:  Fall, pain, swelling EXAM: RIGHT KNEE - COMPLETE 4+ VIEW COMPARISON:  None FINDINGS: There are advanced tricompartment degenerative changes with near complete joint space loss in the medial and patellofemoral compartments. Chondrocalcinosis in the lateral compartment. Large intra-articular loose body in the suprapatellar bursa. Diffuse osteopenia. Questionable avulsed fragment off the anterior cortex of the proximal tibia seen only on the oblique view. Recommend correlation for pain over the proximal tibia. IMPRESSION: Advanced degenerative joint disease changes, chondrocalcinosis and large intraarticular loose body in the suprapatellar bursa. Question cortical avulsion off the anterior proximal tibia noted on only an oblique view. Electronically Signed   By: Rolm Baptise M.D.   On: 04/02/2017 13:18   Dg Femur, Min 2 Views Right  Result Date: 04/02/2017 CLINICAL DATA:  Severe leg pain.  Status post fall. EXAM: RIGHT FEMUR 2 VIEWS COMPARISON:  Pelvis x-ray 01/21/2016 FINDINGS: Two-view exam shows the patient be status post a right hip hemiarthroplasty. Bones are markedly demineralized. No evidence for periprosthetic femur  fracture. No dislocation of the femoral component. No worrisome lytic or sclerotic osseous abnormality. Degenerative changes are evident at the knee. Probable intra-articular loose body in the suprapatellar bursa. IMPRESSION: 1. Status post right hip hemiarthroplasty without evidence for periprosthetic right femur fracture. 2. Marked osteopenia. 3. Degenerative changes in the knee with intra-articular loose body overlying the suprapatellar bursa. Electronically Signed   By: Misty Stanley M.D.   On: 04/02/2017 13:36    Microbiology: Recent Results  (from the past 240 hour(s))  Urine culture     Status: Abnormal (Preliminary result)   Collection Time: 04/02/17  1:44 PM  Result Value Ref Range Status   Specimen Description URINE, CATHETERIZED  Final   Special Requests Normal  Final   Culture >=100,000 COLONIES/mL GRAM NEGATIVE RODS (A)  Final   Report Status PENDING  Incomplete     Labs: Basic Metabolic Panel:  Recent Labs Lab 04/03/17 1959 04/04/17 0438  NA 145 140  K 3.5 3.3*  CL 110 108  CO2 28 26  GLUCOSE 88 82  BUN 21* 20  CREATININE 1.14* 1.11*  CALCIUM 8.6* 8.1*   CBC:  Recent Labs Lab 04/03/17 1959 04/04/17 0054 04/04/17 0438  WBC 7.8 9.1 7.5  NEUTROABS 5.3  --   --   HGB 8.6* 8.9* 7.9*  HCT 25.3* 26.6* 24.2*  MCV 97.3 97.8 98.0  PLT 100* 106* 88*   Signed:  Barton Dubois MD.  [facility] 04/04/2017, 2:53 PM

## 2017-04-04 NOTE — Care Management Obs Status (Signed)
[facility] NOTIFICATION   Patient Details  Name: Shari Mcmahon MRN: [id number] Date of Birth: 1926-12-13   Medicare Observation Status Notification Given:  Yes    MahabirJuliann Pulse, RN 04/04/2017, 12:45 PM

## 2017-04-04 NOTE — Progress Notes (Addendum)
Report called to [facility]  [phone] spoke to [place]. Questions, concerns denied. Pt stable at discharge, no change from am asessment

## 2017-04-04 NOTE — Progress Notes (Signed)
Nutrition Brief Note  Patient identified on the Malnutrition Screening Tool (MST) Report  Wt Readings from Last 15 Encounters:  04/04/17 127 lb 3.3 oz (57.7 kg)  02/02/17 127 lb (57.6 kg)  08/09/16 136 lb (61.7 kg)  07/11/16 133 lb 6.4 oz (60.5 kg)  05/11/16 134 lb (60.8 kg)  02/24/16 138 lb (62.6 kg)  01/21/16 138 lb (62.6 kg)  01/14/16 138 lb (62.6 kg)  01/04/16 138 lb 2 oz (62.7 kg)  12/31/15 138 lb 2 oz (62.7 kg)  11/20/15 134 lb 9.6 oz (61.1 kg)  11/04/15 139 lb (63 kg)  09/29/15 128 lb 3.2 oz (58.2 kg)  09/21/15 128 lb 3.2 oz (58.2 kg)  08/25/15 128 lb (58.1 kg)    Body mass index is 20.53 kg/m. Patient meets criteria for normal weight based on current BMI. In the ED, pt had reported that she has lost 80 lbs in the past year. Based on weight hx table above, pt has lost 11 lbs (8% body weight) in the past 14 months which is not significant for time frame. Skin WDL. Pt admitted for R tibia fx s/p fall at home. H&P states hx of dementia, HTN, and chronic anemia.   Current diet order is Heart Healthy and pt consumed ~340 kcal and 10 grams of protein for breakfast. Lunch was ordered and was ~600 kcal and 28 grams of protein. Boost Plus has been ordered TID, each supplement provides 360 kcal and 14 grams of protein; pt accepted first dose of this supplement this AM. Labs and medications reviewed.   No further nutrition interventions warranted at this time. If nutrition issues arise, please consult RD.     Jarome Matin, MS, RD, LDN, [facility] Inpatient Clinical Dietitian Pager # [phone] After hours/weekend pager # [phone]

## 2017-04-04 NOTE — Care Management Note (Signed)
Case Management Note  Patient Details  Name: Shari Mcmahon MRN: [id number] Date of Birth: 21-Mar-1927  Subjective/Objective: 83 y/o f admitted w/Fall. Closed R tib fx. From home, w/c bound. Ortho-no surgery, NWB, R leg immobilizer.PT-[facility] SNF. CSW notified-following for SNF. Dtr-Willie May is the contact c#[phone].                   Action/Plan:d/c SNF.   Expected Discharge Date:                  Expected Discharge Plan:  Skilled Nursing Facility  In-House Referral:  Clinical Social Work  Discharge planning Services  CM Consult  Post Acute Care Choice:    Choice offered to:     DME Arranged:    DME Agency:     HH Arranged:    [place] Agency:     Status of Service:  In process, will continue to follow  If discussed at Long Length of Stay Meetings, dates discussed:    Additional Comments:  Dessa Phi, RN 04/04/2017, 12:46 PM

## 2017-04-04 NOTE — Clinical Social Work Placement (Signed)
Patient received and accepted bed offer at [facility]. [facility] contacted, family aware. Patient's RN can call report to [phone].   CLINICAL SOCIAL WORK PLACEMENT  NOTE  Date:  04/04/2017  Patient Details  Name: Shari Mcmahon MRN: [id number] Date of Birth: January 13, 1927  Clinical Social Work is seeking post-discharge placement for this patient at the [place] level of care (*CSW will initial, date and re-position this form in  chart as items are completed):  Yes   Patient/family provided with [place] Work Department's list of facilities offering this level of care within the geographic area requested by the patient (or if unable, by the patient's family).  Yes   Patient/family informed of their freedom to choose among providers that offer the needed level of care, that participate in Medicare, Medicaid or managed care program needed by the patient, have an available bed and are willing to accept the patient.  Yes   Patient/family informed of [place] [facility]'s ownership interest in [facility] and [facility], as well as of the fact that they are under no obligation to receive care at these facilities.  PASRR submitted to EDS on       PASRR number received on       Existing PASRR number confirmed on 04/04/17     FL2 transmitted to all facilities in geographic area requested by pt/family on       FL2 transmitted to all facilities within larger geographic area on       Patient informed that his/her managed care company has contracts with or will negotiate with certain facilities, including the following:        Yes   Patient/family informed of bed offers received.  Patient chooses bed at [facility]     Physician recommends and patient chooses bed at      Patient to be transferred to [facility] on 04/04/17.  Patient to be transferred to facility by [facility]     Patient family notified on  04/04/17 of transfer.  Name of family member notified:  [facility]     PHYSICIAN       Additional Comment:    _______________________________________________ Burnis Medin, LCSW 04/04/2017, 3:40 PM

## 2017-04-04 NOTE — Evaluation (Signed)
Physical Therapy Evaluation Patient Details Name: Shari Mcmahon MRN: [id number] DOB: [birth year] Today's Date: 04/04/2017   History of Present Illness  83 y.o. female with history of dementia, hypertension, chronic anemia, recurrent UTIs, DM2, PVD originally presented to ED s/p mechanical fall at home and sent home to f/u with ortho, however returns to ED due to inability to ambulate. Patient usually uses a walker with assistance from patient's daughter to ambulate.  Clinical Impression  Pt admitted with above diagnosis. Pt currently with functional limitations due to the deficits listed below (see PT Problem List).  Pt will benefit from skilled PT to increase their independence and safety with mobility to allow discharge to the venue listed below.  Pt agreeable to attempt to mobilize however only able to tolerate sitting EOB.  Pt with increased R LE pain with any movement.  Pt aware plan is for rehab upon leaving hospital.     Follow Up Recommendations SNF;Supervision/Assistance - 24 hour    Equipment Recommendations  None recommended by PT    Recommendations for Other Services       Precautions / Restrictions Precautions Precautions: Fall Required Braces or Orthoses: Knee Immobilizer - Right Knee Immobilizer - Right: On at all times Restrictions Weight Bearing Restrictions: Yes RLE Weight Bearing: Non weight bearing      Mobility  Bed Mobility Overal bed mobility: Needs Assistance Bed Mobility: Supine to Sit;Sit to Supine     Supine to sit: +2 for physical assistance;Max assist Sit to supine: +2 for physical assistance;Max assist   General bed mobility comments: increased time due to pain, pt attempting to assist as much as possible however pain limiting, supported R LE throughout session (in [place])  Transfers                 General transfer comment: pt did not wish to attempt due to pain, aware she is NWB on R LE  Ambulation/Gait                Stairs             Wheelchair Mobility    Modified Rankin (Stroke Patients Only)       Balance Overall balance assessment: History of Falls                                           Pertinent Vitals/Pain Pain Assessment: Faces Faces Pain Scale: Hurts whole lot Pain Location: R ribs, R hip, R lower leg Pain Descriptors / Indicators: Guarding;Grimacing Pain Intervention(s): Limited activity within patient's tolerance;Repositioned (RN reports pt has received only tylenol due to low BP)    Home Living Family/patient expects to be discharged to:: Private residence Living Arrangements: Children;Other relatives (daughter)   Type of Home: Apartment Home Access: Stairs to enter   Entrance Stairs-Number of Steps: flight Home Layout: One level Home Equipment: Environmental consultant - 4 wheels;Bedside commode Additional Comments: pt reports EMS called for stair transport, lives on second level    Prior Function Level of Independence: Needs assistance   Gait / Transfers Assistance Needed: pt reports she can ambulate to her bathroom with walker and daughter holding gait belt  ADL's / Homemaking Assistance Needed: likely total assist        Hand Dominance        Extremity/Trunk Assessment   Upper Extremity Assessment Upper Extremity Assessment: Generalized weakness    Lower  Extremity Assessment Lower Extremity Assessment: RLE deficits/detail;Generalized weakness (pt reports severe peripheral neuropathy) RLE Deficits / Details: hx of R hip hemi approx one year ago, KI in place, provided assist for R LE due to pain RLE Sensation: history of peripheral neuropathy       Communication   Communication: No difficulties  Cognition Arousal/Alertness: Awake/alert Behavior During Therapy: WFL for tasks assessed/performed Overall Cognitive Status: History of cognitive impairments - at baseline                                 General Comments: hx of dementia, son  present for session      General Comments      Exercises     Assessment/Plan    PT Assessment Patient needs continued PT services  PT Problem List Decreased strength;Decreased activity tolerance;Decreased balance;Decreased knowledge of use of DME;Pain;Decreased mobility       PT Treatment Interventions Gait training;Therapeutic activities;DME instruction;Therapeutic exercise;Functional mobility training;Patient/family education;Wheelchair mobility training    PT Goals (Current goals can be found in the Care Plan section)  Acute Rehab PT Goals PT Goal Formulation: With patient Time For Goal Achievement: 04/11/17 Potential to Achieve Goals: Fair    Frequency Min 3X/week   Barriers to discharge Inaccessible home environment      Co-evaluation               AM-PAC PT "6 Clicks" Daily Activity  Outcome Measure Difficulty turning over in bed (including adjusting bedclothes, sheets and blankets)?: A Lot Difficulty moving from lying on back to sitting on the side of the bed? : Total Difficulty sitting down on and standing up from a chair with arms (e.g., wheelchair, bedside commode, etc,.)?: Total Help needed moving to and from a bed to chair (including a wheelchair)?: Total Help needed walking in hospital room?: Total Help needed climbing 3-5 steps with a railing? : Total 6 Click Score: 7    End of Session Equipment Utilized During Treatment: Right knee immobilizer Activity Tolerance: Patient limited by pain Patient left: in bed;with call bell/phone within reach;with family/visitor present;with bed alarm set Nurse Communication: Mobility status PT Visit Diagnosis: Difficulty in walking, not elsewhere classified (R26.2);Pain Pain - Right/Left: Right Pain - part of body: Leg    Time: 3734-2876 PT Time Calculation (min) (ACUTE ONLY): 19 min   Charges:   PT Evaluation $PT Eval Low Complexity: 1 Procedure     PT G Codes:   PT G-Codes **NOT FOR INPATIENT  CLASS** Functional Assessment Tool Used: AM-PAC 6 Clicks Basic Mobility;Clinical judgement Functional Limitation: Mobility: Walking and moving around Mobility: Walking and Moving Around Current Status (O1157): At least 80 percent but less than 100 percent impaired, limited or restricted Mobility: Walking and Moving Around Goal Status [phone]): At least 60 percent but less than 80 percent impaired, limited or restricted    Carmelia Bake, PT, DPT 04/04/2017 Pager: [phone]   York Ram E 04/04/2017, 11:54 AM

## 2017-04-04 NOTE — NC FL2 (Signed)
[place] LEVEL OF CARE SCREENING TOOL     IDENTIFICATION  Patient Name: Shari Mcmahon Birthdate: [birth year] Sex: female Admission Date (Current Location): 04/03/2017  [facility] and [place] Number:  Herbalist and Address:  [facility],  [place], [place]      Provider Number: [id number]  Attending Physician Name and Address:  Barton Dubois, MD  Relative Name and Phone Number:       Current Level of Care: Hospital Recommended Level of Care: [place] Prior Approval Number:    Date Approved/Denied:   PASRR Number:  [id number]  Discharge Plan: SNF    Current Diagnoses: Patient Active Problem List   Diagnosis Date Noted  . Fall 04/03/2017  . Closed right tibial fracture 04/03/2017  . Tibial fracture 04/03/2017  . Acute cystitis without hematuria   . Encounter for well adult exam with abnormal findings 02/02/2017  . Urinary incontinence 02/02/2017  . Seizure-like activity ([place])   . Syncope 07/07/2016  . Atherosclerosis of native arteries of the extremities with ulceration ([place]) 05/11/2016    Class: Chronic  . Weakness generalized   . Hypernatremia   . Aspiration pneumonia ([place])   . AKI (acute kidney injury) ([place])   . Ulcer of left heel and midfoot with necrosis of muscle ([place]) 01/17/2016  . Type II diabetes mellitus with peripheral circulatory disorder ([place]) 01/01/2016  . Pressure ulcer 01/01/2016  . Peripheral autonomic neuropathy due to diabetes mellitus ([place]) 11/15/2015  . GERD without esophagitis 10/30/2015  . Alzheimer's dementia 07/28/2015  . Fracture of right femur ([place]) 07/28/2015  . Arthritis of knee 05/19/2015  . Anemia 02/25/2015  . Loss of weight 09/17/2014  . Chronic pain syndrome 06/16/2014  . Primary localized osteoarthrosis, lower leg 01/08/2014  . Rotator cuff tear arthropathy of both shoulders 12/09/2013  . CONSTIPATION, CHRONIC 10/15/2010  . Hyperlipidemia 01/27/2010  .  Gastroparesis 12/24/2009  . Dysphagia 12/24/2009  . DEPRESSION 06/17/2009  . BRADYCARDIA, CHRONIC 12/17/2008  . Peripheral neuropathy ([place]) 06/19/2008  . Lung nodules 03/03/2008  . GENERALIZED OSTEOARTHROSIS INVOLVING HAND 10/03/2007  . Gout 09/19/2007  . GLAUCOMA 09/19/2007  . Essential hypertension 09/19/2007    Orientation RESPIRATION BLADDER Height & Weight     Self, Place, Situation  Normal External catheter Weight: 127 lb 3.3 oz (57.7 kg) Height:  5\' 6"  (167.6 cm)  BEHAVIORAL SYMPTOMS/MOOD NEUROLOGICAL BOWEL NUTRITION STATUS        Diet (Heart)  AMBULATORY STATUS COMMUNICATION OF NEEDS Skin   Total Care Verbally Normal                       Personal Care Assistance Level of Assistance  Bathing, Feeding, Dressing Bathing Assistance: Maximum assistance Feeding assistance: Independent Dressing Assistance: Limited assistance     Functional Limitations Info             SPECIAL CARE FACTORS FREQUENCY  PT (By licensed PT), OT (By licensed OT)     PT Frequency: 5x/week OT Frequency: 5x/week            Contractures Contractures Info: Not present    Additional Factors Info  Code Status, Allergies Code Status Info: DNR Allergies Info: Penicillins,Voltaren Diclofenac Sodium,Dulcolax Bisacodyl,Oxycodone,Duloxetine Hcl,Timolol           Current Medications (04/04/2017):  This is the current hospital active medication list Current Facility-Administered Medications  Medication Dose Route Frequency Provider Last Rate Last Dose  . acetaminophen (TYLENOL) tablet 650  mg  650 mg Oral TID Barton Dubois, MD       Or  . acetaminophen (TYLENOL) suppository 650 mg  650 mg Rectal TID Barton Dubois, MD      . amLODipine (NORVASC) tablet 5 mg  5 mg Oral Daily Rise Patience, MD      . cefTRIAXone (ROCEPHIN) 1 g in dextrose 5 % 50 mL IVPB  1 g Intravenous Q24H [place], [place], [place]   Stopped at 04/03/17 2111  . colchicine tablet 0.6 mg  0.6 mg Oral Daily Rise Patience, MD   0.6 mg at 04/04/17 0943  . donepezil (ARICEPT) tablet 5 mg  5 mg Oral QHS Rise Patience, MD   5 mg at 04/04/17 0142  . febuxostat (ULORIC) tablet 40 mg  40 mg Oral Daily Rise Patience, MD   40 mg at 04/04/17 0943  . HYDROcodone-acetaminophen (NORCO/VICODIN) 5-325 MG per tablet 1 tablet  1 tablet Oral Q6H PRN Barton Dubois, MD      . lactose free nutrition (BOOST PLUS) liquid 237 mL  237 mL Oral TID BM Rise Patience, MD   237 mL at 04/04/17 1030  . latanoprost (XALATAN) 0.005 % ophthalmic solution 1 drop  1 drop Both Eyes QHS Rise Patience, MD   1 drop at 04/04/17 0141  . memantine (NAMENDA XR) 24 hr capsule 28 mg  28 mg Oral Daily Rise Patience, MD   28 mg at 04/04/17 0943  . methocarbamol (ROBAXIN) tablet 500 mg  500 mg Oral Q8H PRN Barton Dubois, MD   500 mg at 04/04/17 1014  . multivitamin with minerals tablet 1 tablet  1 tablet Oral Daily Rise Patience, MD   1 tablet at 04/04/17 [phone]  . ondansetron (ZOFRAN) tablet 4 mg  4 mg Oral Q6H PRN Rise Patience, MD       Or  . ondansetron [facility]) injection 4 mg  4 mg Intravenous Q6H PRN Rise Patience, MD      . pantoprazole (PROTONIX) EC tablet 40 mg  40 mg Oral Daily Rise Patience, MD   40 mg at 04/04/17 7672     Discharge Medications: Please see discharge summary for a list of discharge medications.  Relevant Imaging Results:  Relevant Lab Results:   Additional Information SS # [id number]  Burnis Medin, LCSW

## 2017-04-04 NOTE — Clinical Social Work Note (Signed)
Clinical Social Work Assessment  Patient Details  Name: Shari Mcmahon MRN: [id number] Date of Birth: November 16, 1926  Date of referral:  04/04/17               Reason for consult:  Facility Placement                Permission sought to share information with:  Facility Sport and exercise psychologist, Family Supports Permission granted to share information::  Yes, Verbal Permission Granted  Name::        Agency::     Relationship::     Contact Information:     Housing/Transportation Living arrangements for the past 2 months:  Single Family Home Source of Information:  Adult Children, Patient Patient Interpreter Needed:  None Criminal Activity/Legal Involvement Pertinent to Current Situation/Hospitalization:  No - Comment as needed Significant Relationships:  Adult Children Lives with:  Adult Children Do you feel safe going back to the place where you live?  Yes Need for family participation in patient care:  Yes (Comment)  Care giving concerns:  Patient unable to ambulate and bear weight on the right leg. Patient resides with daughter, PT recommending ST rehab at [facility]   Social Worker assessment / plan:  CSW spoke with patient/patient's son at bedside. Patient's son reported that patient has been residing with her daughter prior to hospitalization. Patient/patient's son reported they were agreeable to [place] rehab at [facility]. CSW informed that patient's daughter Shari Mcmahon is HCPOA. CSW contacted Patient's daughter/HCPOA, she reported that she is agreeable to SNF. CSW completed FL2 and will assist patient/patient's family with discharge planning to SNF.   Employment status:  Retired Nurse, adult PT Recommendations:  [place] / Referral to community resources:  [place]  Patient/Family's Response to care:  Patient/patient's daughter agreeable to SNF. Patient reported that she would like to be around more people her  age.  Patient/Family's Understanding of and Emotional Response to Diagnosis, Current Treatment, and Prognosis:  Patient able to verbalize understanding of diagnosis, explaining fall that caused fracture. Patient/patient's family reported that the goal is for ST rehab and then discuss future living arrangements after rehab. Patient presented pleasant when speaking with CSW during assessment.   Emotional Assessment Appearance:  Appears stated age Attitude/Demeanor/Rapport:  Other (Cooperative) Affect (typically observed):  Appropriate Orientation:  Oriented to Self, Oriented to Situation Alcohol / Substance use:  Not Applicable Psych involvement (Current and /or in the community):  No (Comment)  Discharge Needs  Concerns to be addressed:  No discharge needs identified Readmission within the last 30 days:  No Current discharge risk:  None Barriers to Discharge:  No Barriers Identified   Burnis Medin, LCSW 04/04/2017, 3:14 PM

## 2017-04-04 NOTE — Care Management Note (Signed)
Case Management Note  Patient Details  Name: Shari Mcmahon MRN: [id number] Date of Birth: April 02, 1927  Subjective/Objective:                    Action/Plan:d/c SNF   Expected Discharge Date:                  Expected Discharge Plan:  Skilled Nursing Facility  In-House Referral:  Clinical Social Work  Discharge planning Services  CM Consult  Post Acute Care Choice:    Choice offered to:     DME Arranged:    DME Agency:     HH Arranged:    [place] Agency:     Status of Service:  Completed, signed off  If discussed at [employer], dates discussed:    Additional Comments:  Dessa Phi, RN 04/04/2017, 2:34 PM

## 2017-04-05 ENCOUNTER — Telehealth: Payer: Self-pay | Admitting: *Deleted

## 2017-04-05 ENCOUNTER — Non-Acute Institutional Stay (SKILLED_NURSING_FACILITY): Payer: Medicare Other | Admitting: Internal Medicine

## 2017-04-05 ENCOUNTER — Encounter: Payer: Self-pay | Admitting: Internal Medicine

## 2017-04-05 DIAGNOSIS — K219 Gastro-esophageal reflux disease without esophagitis: Secondary | ICD-10-CM | POA: Diagnosis not present

## 2017-04-05 DIAGNOSIS — I10 Essential (primary) hypertension: Secondary | ICD-10-CM | POA: Diagnosis not present

## 2017-04-05 DIAGNOSIS — G588 Other specified mononeuropathies: Secondary | ICD-10-CM

## 2017-04-05 DIAGNOSIS — N390 Urinary tract infection, site not specified: Secondary | ICD-10-CM | POA: Diagnosis not present

## 2017-04-05 DIAGNOSIS — M109 Gout, unspecified: Secondary | ICD-10-CM | POA: Diagnosis not present

## 2017-04-05 DIAGNOSIS — S82101D Unspecified fracture of upper end of right tibia, subsequent encounter for closed fracture with routine healing: Secondary | ICD-10-CM | POA: Diagnosis not present

## 2017-04-05 DIAGNOSIS — F0280 Dementia in other diseases classified elsewhere without behavioral disturbance: Secondary | ICD-10-CM

## 2017-04-05 DIAGNOSIS — G308 Other Alzheimer's disease: Secondary | ICD-10-CM

## 2017-04-05 DIAGNOSIS — B9620 Unspecified Escherichia coli [E. coli] as the cause of diseases classified elsewhere: Secondary | ICD-10-CM

## 2017-04-05 LAB — URINE CULTURE: Special Requests: NORMAL

## 2017-04-05 NOTE — Telephone Encounter (Signed)
Pt was on the TCM list admitted 04/03/17 for Closed right tibial fracture. Pt was d/c 04/04/17 and sent to SNF  for further care and rehabilitation. Per summary will need to follow-up with PCP in 2 weeks after being d/c for SNF...Shari Mcmahon

## 2017-04-05 NOTE — Progress Notes (Signed)
Office Visit Note   Patient: Shari Mcmahon           Date of Birth: September 03, 1927           MRN: [id number] Visit Date: 04/03/2017 Requested by: Biagio Borg, MD [place], [place] [address] PCP: Biagio Borg, MD  Subjective: Chief Complaint  Patient presents with  . Right Leg - Fracture    HPI: Shari Mcmahon is an 83 year old minimally ambulatory patient who injured herself 04/02/2017.  She had a fall in the bathroom which is a low energy mechanism of injury.  Been taking hydrocodone for pain.  She's been in a knee immobilizer.  She reports having pain in the leg.  She states and her daughter states that she has low tolerance for pain.  Injury happened about 30 hours ago.  She went to the emergency room where diagnosis of severe knee arthritis was made along with nondisplaced proximal tibia fracture.  She does have a history of bilateral lower extremity neuropathy.  Her daughter who is her caregiver states that she really cannot handle Shari Mcmahon here anymore because of difficulty getting up and down stairs to the second floor apartment as well as mobilizing her.              ROS: All systems reviewed are negative as they relate to the chief complaint within the history of present illness.  Patient denies  fevers or chills.   Assessment & Plan: Visit Diagnoses:  1. Other closed fracture of proximal end of right tibia, initial encounter     Plan: Impression is low energy fall with nondisplaced proximal tibia fracture in the face of severe knee arthritis and underlying bilateral lower extremity neuropathy.  She does have swelling around the proximal tibial region but no pain with passive plantarflexion and dorsiflexion of the foot.  Generalized muscle atrophy is present.  No clear evidence of compartment syndrome at this time.  Also had Dr. Sharol Given look at the patient as she is familiar with him from prior surgery.  In terms of disposition she will need to be nonweightbearing for a  period of 3-4 weeks in this skilled nursing facility will likely be her best option.  She may decide to go to the emergency room to facilitate that process.  I needn't see her back in 3 weeks.  His fine for her to bend her knee but I don't want her to do any weightbearing during that time.  Follow-Up Instructions: Return in about 2 weeks (around 04/17/2017).   Orders:  Orders Placed This Encounter  Procedures  . XR Ribs Unilateral Right   No orders of the defined types were placed in this encounter.     Procedures: No procedures performed   Clinical Data: No additional findings.  Objective: Vital Signs: There were no vitals taken for this visit.  Physical Exam:   Constitutional: Patient appears well-developed HEENT:  Head: Normocephalic Eyes:EOM are normal Neck: Normal range of motion Cardiovascular: Normal rate Pulmonary/chest: Effort normal Neurologic: Patient is alert Skin: Skin is warm Psychiatric: Patient has normal mood and affect    Ortho Exam: Orthopedic exam demonstrates generalized sensitivity to palpation of both legs in the thigh and calf region without much distinction.  She has perfused feet and no pain with passive ankle plantar flexion or dorsiflexion.  She does have paresthesias in both feet consistent with her diagnosis of neuropathy.  She does have swelling in the proximal tibial region but the  compartments are soft.  She is not on blood thinners.  There is no knee effusion on the right.  Extensor mechanism is intact.  Generalized muscle atrophy is present in the thighs and calves.  Specialty Comments:  No specialty comments available.  Imaging: No results found.   PMFS History: Patient Active Problem List   Diagnosis Date Noted  . Hypokalemia   . Fall 04/03/2017  . Closed right tibial fracture 04/03/2017  . Tibial fracture 04/03/2017  . Acute cystitis without hematuria   . Encounter for well adult exam with abnormal findings 02/02/2017  .  Urinary incontinence 02/02/2017  . Seizure-like activity ([place])   . Syncope 07/07/2016  . Atherosclerosis of native arteries of the extremities with ulceration ([place]) 05/11/2016    Class: Chronic  . Weakness generalized   . Hypernatremia   . Aspiration pneumonia ([place])   . AKI (acute kidney injury) ([place])   . Ulcer of left heel and midfoot with necrosis of muscle ([place]) 01/17/2016  . Type II diabetes mellitus with peripheral circulatory disorder ([place]) 01/01/2016  . Pressure ulcer 01/01/2016  . Peripheral autonomic neuropathy due to diabetes mellitus ([place]) 11/15/2015  . GERD without esophagitis 10/30/2015  . Alzheimer's dementia 07/28/2015  . Fracture of right femur ([place]) 07/28/2015  . Arthritis of knee 05/19/2015  . Anemia 02/25/2015  . Loss of weight 09/17/2014  . Chronic pain syndrome 06/16/2014  . Primary localized osteoarthrosis, lower leg 01/08/2014  . Rotator cuff tear arthropathy of both shoulders 12/09/2013  . CONSTIPATION, CHRONIC 10/15/2010  . Hyperlipidemia 01/27/2010  . Gastroparesis 12/24/2009  . Dysphagia 12/24/2009  . DEPRESSION 06/17/2009  . BRADYCARDIA, CHRONIC 12/17/2008  . Peripheral neuropathy ([place]) 06/19/2008  . Lung nodules 03/03/2008  . GENERALIZED OSTEOARTHROSIS INVOLVING HAND 10/03/2007  . Gout 09/19/2007  . GLAUCOMA 09/19/2007  . Essential hypertension 09/19/2007   Past Medical History:  Diagnosis Date  . ABDOMINAL PAIN, CHRONIC 04/07/2008  . Aneurysm of thoracic aorta ([place])    "found 03/2015; not big enough to repair" (07/07/2016)  . ARTHRITIS 03/03/2008  . Arthritis    "shoulders, knees, hands" (07/07/2016)  . Arthritis of knee, degenerative 10/19/2011  . Aspiration pneumonia ([place])   . BRADYCARDIA, CHRONIC 12/17/2008  . CARDIAC MURMUR 02/01/2010  . Complication of anesthesia    "she retains carbon dioxide; sleeps too long" (07/07/2016)  . CONSTIPATION, CHRONIC 10/15/2010  . DEGENERATIVE JOINT DISEASE, KNEES, BILATERAL 12/07/2007  . DEPRESSION 06/17/2009  .  DIABETES MELLITUS, TYPE II 09/19/2007   DIET CONTROL   . DVT (deep venous thrombosis) ([facility]) BLE  . DYSPHAGIA UNSPECIFIED 12/24/2009  . Gastroparesis 12/24/2009  . GENERALIZED OSTEOARTHROSIS INVOLVING HAND 10/03/2007  . GERD (gastroesophageal reflux disease)   . GLAUCOMA 09/19/2007  . GOUT 09/19/2007  . GOUTY ARTHROPATHY UNSPECIFIED 02/17/2009  . Hepatitis   . HYPERLIPIDEMIA 01/27/2010  . HYPERTENSION 09/19/2007  . LUNG NODULE 03/03/2008  . MENOPAUSAL DISORDER 12/17/2008  . PERIPHERAL NEUROPATHY 06/19/2008  . PERSONAL HISTORY MALIGNANT NEOPLASM STOMACH 09/19/2007  . 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 STOMACH CANCER 03/03/2008  . UTI 12/15/2009    Family History  Problem Relation Age of Onset  . Uterine cancer Mother   . Stroke Father        Hemorrhagic  . Cancer Sister        breast cancer and one sister with uterine cancer and one sister with ovarian cancerr  . Colon cancer Maternal Grandmother   . Colon cancer Paternal Grandfather   . Diabetes Other   .  Prostate cancer Brother   . Stomach cancer Neg Hx     Past Surgical History:  Procedure Laterality Date  . BALLOON DILATION N/A 07/12/2016   Procedure: BALLOON DILATION;  Surgeon: Manus Gunning, MD;  Location: [facility] ENDOSCOPY;  Service: Gastroenterology;  Laterality: N/A;  . BILROTH II PROCEDURE    . CATARACT EXTRACTION W/ INTRAOCULAR LENS  IMPLANT, BILATERAL    . COLONOSCOPY    . ESOPHAGOGASTRODUODENOSCOPY N/A 07/12/2016   Procedure: ESOPHAGOGASTRODUODENOSCOPY (EGD);  Surgeon: Manus Gunning, MD;  Location: [place];  Service: Gastroenterology;  Laterality: N/A;  . FRACTURE SURGERY    . HIP FRACTURE SURGERY Right 03/2015   Social History   Occupational History  . retired Retired   Social History Main Topics  . Smoking status: Former Smoker    Packs/day: 0.50    Years: 25.00    Types: Cigarettes  . Smokeless tobacco: Never Used     Comment: "stopped when she was 83 years old"  .  Alcohol use 0.0 oz/week     Comment: 07/07/2016 "nothing in years; stopped maybe in the  1980s"  . Drug use: No  . Sexual activity: Not on file

## 2017-04-05 NOTE — Progress Notes (Signed)
: Provider:  Noah Delaine. Sheppard Coil, MD Location:  [place] Room Number: [phone] Place of Service:  SNF ([phone])  PCP: Biagio Borg, MD Patient Care Team: Biagio Borg, MD as PCP - General (Internal Medicine) Pa, [facility] (Specialist) Newt Minion, MD as Consulting Physician (Orthopedic Surgery)  Extended Emergency Contact Information Primary Emergency Contact: [facility] May Address: [place], [place] [address] Johnnette Litter of [place] Phone: [phone] Mobile Phone: [phone] Relation: Daughter Secondary Emergency Contact: Murphy,Jacqueline Address: [place], [place] of [place] Phone: [phone] Mobile Phone: [phone] Relation: Daughter     Allergies: Penicillins; Voltaren [diclofenac sodium]; Dulcolax [bisacodyl]; Duloxetine hcl; Oxycodone; and Timolol  Chief Complaint  Patient presents with  . New Admit To SNF    Admit to Facility    HPI: Patient is 83 y.o. female wIth dementia, hypertension, chronic anemia was brought to the ER 24 hours ago after patient had a mechanical fall at home. Patient usually uses a walker with assistance from patient's daughter to ambulate. Patient lost balance and fell at home 24 hours ago and was brought to the ER. At that time CT head C-spine was negative for anything acute. CT of the right knee showed right tibial fracture. Patient was referred to orthopedic surgeon Dr. Sharol Given after knee immobilizer was placed. At Dr. Jess Barters office patient was found to be having difficulty ambulating and was referred to back to ER for placement. Pt was admitted [facility] from 6/4-5 where ortho rec non surgical approach with use knee immobilizer. Hospital course complicated by a UTI.Pt is admitted to SNF for OT/PT.While atSNF pt will be followed for HTN, tx with norvasc, dementia, tx with aricept and namenda and hx gout, tx with uloric and  colchicine   Past Medical History:  Diagnosis Date  . ABDOMINAL PAIN, CHRONIC 04/07/2008  . Aneurysm of thoracic aorta ([place])    "found 03/2015; not big enough to repair" (07/07/2016)  . ARTHRITIS 03/03/2008  . Arthritis    "shoulders, knees, hands" (07/07/2016)  . Arthritis of knee, degenerative 10/19/2011  . Aspiration pneumonia ([place])   . BRADYCARDIA, CHRONIC 12/17/2008  . CARDIAC MURMUR 02/01/2010  . Complication of anesthesia    "she retains carbon dioxide; sleeps too long" (07/07/2016)  . CONSTIPATION, CHRONIC 10/15/2010  . DEGENERATIVE JOINT DISEASE, KNEES, BILATERAL 12/07/2007  . DEPRESSION 06/17/2009  . DIABETES MELLITUS, TYPE II 09/19/2007   DIET CONTROL   . DVT (deep venous thrombosis) (HCC) BLE  . DYSPHAGIA UNSPECIFIED 12/24/2009  . Gastroparesis 12/24/2009  . GENERALIZED OSTEOARTHROSIS INVOLVING HAND 10/03/2007  . GERD (gastroesophageal reflux disease)   . GLAUCOMA 09/19/2007  . GOUT 09/19/2007  . GOUTY ARTHROPATHY UNSPECIFIED 02/17/2009  . Hepatitis   . HYPERLIPIDEMIA 01/27/2010  . HYPERTENSION 09/19/2007  . LUNG NODULE 03/03/2008  . MENOPAUSAL DISORDER 12/17/2008  . PERIPHERAL NEUROPATHY 06/19/2008  . PERSONAL HISTORY MALIGNANT NEOPLASM STOMACH 09/19/2007  . 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 STOMACH CANCER 03/03/2008  . UTI 12/15/2009    Past Surgical History:  Procedure Laterality Date  . BALLOON DILATION N/A 07/12/2016   Procedure: BALLOON DILATION;  Surgeon: Manus Gunning, MD;  Location: [facility] ENDOSCOPY;  Service: Gastroenterology;  Laterality: N/A;  . BILROTH II PROCEDURE    . CATARACT EXTRACTION W/ INTRAOCULAR LENS  IMPLANT, BILATERAL    . COLONOSCOPY    .  ESOPHAGOGASTRODUODENOSCOPY N/A 07/12/2016   Procedure: ESOPHAGOGASTRODUODENOSCOPY (EGD);  Surgeon: Manus Gunning, MD;  Location: [place];  Service: Gastroenterology;  Laterality: N/A;  . FRACTURE SURGERY    . HIP FRACTURE SURGERY Right 03/2015    Allergies as of 04/05/2017       Reactions   Penicillins Shortness Of Breath, Swelling   Has patient had a PCN reaction causing immediate rash, facial/tongue/throat swelling, SOB or lightheadedness with hypotension: yes Has patient had a PCN reaction causing severe rash involving mucus membranes or skin necrosis: no Has patient had a PCN reaction that required hospitalization: unknown Has patient had a PCN reaction occurring within the last 10 years: no If all of the above answers are "NO", then may proceed with Cephalosporin use.   Voltaren [diclofenac Sodium] Other (See Comments)   Stomach irritation   Dulcolax [bisacodyl]    Unknown    Duloxetine Hcl Other (See Comments)   Made patient not want to eat or drink   Oxycodone Other (See Comments)   hallucinations   Timolol    REACTION: bradycardia worse      Medication List       Accurate as of 04/05/17 10:43 AM. Always use your most recent med list.          acetaminophen 325 MG tablet Commonly known as:  TYLENOL Take 2 tablets (650 mg total) by mouth 3 (three) times daily.   amLODipine 5 MG tablet Commonly known as:  NORVASC TAKE 1 TABLET (5 MG TOTAL) BY MOUTH DAILY.   cefUROXime 250 MG tablet Commonly known as:  CEFTIN Take 1 tablet (250 mg total) by mouth 2 (two) times daily.   colchicine 0.6 MG tablet TAKE 1 TABLET (0.6 MG TOTAL) BY MOUTH DAILY AS NEEDED (GOUT PAIN.).   docusate sodium 100 MG capsule Commonly known as:  COLACE Take 1 capsule (100 mg total) by mouth 2 (two) times daily as needed for mild constipation.   donepezil 10 MG tablet Commonly known as:  ARICEPT TAKE 1 TABLET (10 MG TOTAL) BY MOUTH AT BEDTIME.   HYDROcodone-acetaminophen 5-325 MG tablet Commonly known as:  NORCO/VICODIN Take 1 tablet by mouth every 6 (six) hours as needed for severe pain.   lactose free nutrition Liqd Take 237 mLs by mouth 3 (three) times daily between meals.   latanoprost 0.005 % ophthalmic solution Commonly known as:  XALATAN PLACE 1 DROP INTO  BOTH EYES AT BEDTIME.   multivitamin with minerals tablet Take 1 tablet by mouth daily.   NAMENDA XR 28 MG Cp24 24 hr capsule Generic drug:  memantine TAKE 1 CAPSULE (28 MG TOTAL) BY MOUTH DAILY.   omeprazole 40 MG capsule Commonly known as:  PRILOSEC TAKE 1 CAPSULE (40 MG TOTAL) BY MOUTH DAILY.   ULORIC 40 MG tablet Generic drug:  febuxostat TAKE 1 TABLET (40 MG TOTAL) BY MOUTH DAILY.       No orders of the defined types were placed in this encounter.   Immunization History  Administered Date(s) Administered  . H1N1 12/17/2008  . Influenza Whole 08/27/2007, 09/30/2008, 07/31/2009, 07/31/2010  . Influenza-Unspecified 07/29/2015  . Pneumococcal Conjugate-13 11/22/2013  . Pneumococcal Polysaccharide-23 10/03/2007, 10/19/2011  . Td 12/15/2009  . Tdap 04/02/2017    Social History  Substance Use Topics  . Smoking status: Former Smoker    Packs/day: 0.50    Years: 25.00    Types: Cigarettes  . Smokeless tobacco: Never Used     Comment: "stopped when she was 83 years old"  .  Alcohol use 0.0 oz/week     Comment: 07/07/2016 "nothing in years; stopped maybe in the  1980s"    Family history is   Family History  Problem Relation Age of Onset  . Uterine cancer Mother   . Stroke Father        Hemorrhagic  . Cancer Sister        breast cancer and one sister with uterine cancer and one sister with ovarian cancerr  . Colon cancer Maternal Grandmother   . Colon cancer Paternal Grandfather   . Diabetes Other   . Prostate cancer Brother   . Stomach cancer Neg Hx       Review of Systems  DATA OBTAINED: from patient, nurse GENERAL:  no fevers, fatigue, appetite changes SKIN: No itching, or rash EYES: No eye pain, redness, discharge EARS: No earache, tinnitus, change in hearing NOSE: No congestion, drainage or bleeding  MOUTH/THROAT: No mouth or tooth pain, No sore throat RESPIRATORY: No cough, wheezing, SOB CARDIAC: No chest pain, palpitations, lower extremity edema   GI: No abdominal pain, No N/V/D or constipation, No heartburn or reflux  GU: No dysuria, frequency or urgency, or incontinence  MUSCULOSKELETAL: No unrelieved bone/joint pain NEUROLOGIC: No headache, dizziness or focal weakness PSYCHIATRIC: No c/o anxiety or sadness   Vitals:   04/05/17 1033  BP: 112/61  Pulse: (!) 51  Resp: 18  Temp: 97.9 F (36.6 C)    SpO2 Readings from Last 1 Encounters:  04/04/17 98%   Body mass index is 20.53 kg/m.     Physical Exam  GENERAL APPEARANCE: Alert,  No acute distress.  SKIN: No diaphoresis rash HEAD: Normocephalic, atraumatic  EYES: Conjunctiva/lids clear. Pupils round, reactive. EOMs intact.  EARS: External exam WNL, canals clear. Hearing grossly normal.  NOSE: No deformity or discharge.  MOUTH/THROAT: Lips w/o lesions  RESPIRATORY: Breathing is even, unlabored. Lung sounds are clear   CARDIOVASCULAR: Heart RRR no murmurs, rubs or gallops. No peripheral edema.   GASTROINTESTINAL: Abdomen is soft, non-tender, not distended w/ normal bowel sounds. GENITOURINARY: Bladder non tender, not distended  MUSCULOSKELETAL: knee immoblizer in place NEUROLOGIC:  Cranial nerves 2-12 grossly intact. Moves all extremities  PSYCHIATRIC: Mood and affect with dementia, no behavioral issues  Patient Active Problem List   Diagnosis Date Noted  . Hypokalemia   . Fall 04/03/2017  . Closed right tibial fracture 04/03/2017  . Tibial fracture 04/03/2017  . Acute cystitis without hematuria   . Encounter for well adult exam with abnormal findings 02/02/2017  . Urinary incontinence 02/02/2017  . Seizure-like activity ([place])   . Syncope 07/07/2016  . Atherosclerosis of native arteries of the extremities with ulceration ([place]) 05/11/2016    Class: Chronic  . Weakness generalized   . Hypernatremia   . Aspiration pneumonia ([place])   . AKI (acute kidney injury) ([place])   . Ulcer of left heel and midfoot with necrosis of muscle ([place]) 01/17/2016  . Type II  diabetes mellitus with peripheral circulatory disorder ([place]) 01/01/2016  . Pressure ulcer 01/01/2016  . Peripheral autonomic neuropathy due to diabetes mellitus ([place]) 11/15/2015  . GERD without esophagitis 10/30/2015  . Alzheimer's dementia 07/28/2015  . Fracture of right femur ([place]) 07/28/2015  . Arthritis of knee 05/19/2015  . Anemia 02/25/2015  . Loss of weight 09/17/2014  . Chronic pain syndrome 06/16/2014  . Primary localized osteoarthrosis, lower leg 01/08/2014  . Rotator cuff tear arthropathy of both shoulders 12/09/2013  . CONSTIPATION, CHRONIC 10/15/2010  . Hyperlipidemia 01/27/2010  .  Gastroparesis 12/24/2009  . Dysphagia 12/24/2009  . DEPRESSION 06/17/2009  . BRADYCARDIA, CHRONIC 12/17/2008  . Peripheral neuropathy ([place]) 06/19/2008  . Lung nodules 03/03/2008  . GENERALIZED OSTEOARTHROSIS INVOLVING HAND 10/03/2007  . Gout 09/19/2007  . GLAUCOMA 09/19/2007  . Essential hypertension 09/19/2007      Labs reviewed: Basic Metabolic Panel:    Component Value Date/Time   NA 140 04/04/2017 0438   NA 140 04/04/2017 0438   K 3.3 (L) 04/04/2017 0438   CL 108 04/04/2017 0438   CO2 26 04/04/2017 0438   GLUCOSE 82 04/04/2017 0438   BUN 20 04/04/2017 0438   BUN 20 04/04/2017 0438   CREATININE 1.11 (H) 04/04/2017 0438   CREATININE 1.1 04/04/2017 0438   CALCIUM 8.1 (L) 04/04/2017 0438   PROT 7.4 02/02/2017 1449   ALBUMIN 3.6 02/02/2017 1449   AST 22 02/02/2017 1449   ALT 13 02/02/2017 1449   ALKPHOS 94 02/02/2017 1449   BILITOT 0.3 02/02/2017 1449   GFRNONAA 43 (L) 04/04/2017 0438   GFRAA 50 (L) 04/04/2017 0438     Recent Labs  07/07/16 1431  02/02/17 1449  04/03/17 1959 04/04/17 0438  NA  --   < > 141  < > 145  145 140  140  K  --   < > 3.9  --  3.5  3.5 3.3*  CL  --   < > 107  --  110 108  CO2  --   < > 29  --  28 26  GLUCOSE  --   < > 74  --  88 82  BUN  --   < > 17  < > 21  21* 20  20  CREATININE  --   < > 0.97  < > 1.14*  1.1 1.11*  1.1  CALCIUM   --   < > 9.2  --  8.6* 8.1*  MG 1.6*  --   --   --   --   --   < > = values in this interval not displayed. Liver Function Tests:  Recent Labs  07/07/16 1431 07/08/16 0203 02/02/17 1449  AST 22 19 22   ALT 11* 9* 13  ALKPHOS 93 73 94  BILITOT 0.4 0.5 0.3  PROT 6.3* 4.9* 7.4  ALBUMIN 3.0* 2.3* 3.6    Recent Labs  05/24/16 0842  LIPASE 11   No results for input(s): AMMONIA in the last 8760 hours. CBC:  Recent Labs  07/07/16 1027  02/02/17 1449  04/03/17 1959 04/04/17 0054 04/04/17 0438  WBC 7.1  < > 6.7  < > 7.8  7.8 9.1  9.1 7.5  7.5  NEUTROABS 5.1  --  4.1  --  5.3  --   --   HGB 12.2  < > 11.3*  --  8.6*  8.6* 8.9*  8.9* 7.9*  HCT 36.7  < > 34.3*  --  25.3*  25* 26.6*  27* 24.2*  MCV 100.0  < > 103.2*  --  97.3 97.8 98.0  PLT 148*  < > 134.0*  --  100*  100* 106*  106* 88*  < > = values in this interval not displayed. Lipid  Recent Labs  02/02/17 1449  CHOL 180  HDL 72.70  LDLCALC 89  TRIG 90.0    Cardiac Enzymes:  Recent Labs  07/07/16 1431 07/07/16 2008 07/08/16 0203  TROPONINI 0.03* 0.06* 0.06*   BNP: No results for input(s): BNP in the last 8760 hours. Lab Results  Component Value Date   MICROALBUR 27.2 (H) 02/02/2017   Lab Results  Component Value Date   HGBA1C 5.0 02/02/2017   Lab Results  Component Value Date   TSH 1.07 02/02/2017   Lab Results  Component Value Date   VITAMINB12 1,232 (H) 07/08/2016   Lab Results  Component Value Date   FOLATE 12.1 12/24/2009   Lab Results  Component Value Date   IRON 88 12/05/2012   FERRITIN 58.6 12/05/2012    Imaging and Procedures obtained prior to SNF admission: No results found.   Not all labs, radiology exams or other studies done during hospitalization come through on my EPIC note; however they are reviewed by me.    Assessment and Plan  R TIBIAL FRACTURE- Patient seen by orthopedic surgery who recommended no surgical approach -Continue supportive care, as  needed analgesics, no weightbearing and knee immobilizer SNF - admitted for OT/PT  E Coli UTI- treated with ceftin for 5 days before cx returned; it was sensitive to ceftin  HTN SNF - controlled ; cont norvasc 5 mg daily  DEMENTIA SNF - stable; cnt aricept 10 mg daily and namenda 28XR daily  GOUT -  No acute flare SNF - cont uloric 40 mg daily and colchicine 0.6 mg prn  GERD SNF - cont prilosec 40mg  daily     Time spent > 45 min;> 50% of time with patient was spent reviewing records, labs, tests and studies, counseling and developing plan of care  Webb Silversmith D. Sheppard Coil, MD

## 2017-04-06 ENCOUNTER — Telehealth: Payer: Self-pay | Admitting: *Deleted

## 2017-04-06 NOTE — Telephone Encounter (Signed)
Post ED Visit - Positive Culture Follow-up  Culture report reviewed by antimicrobial stewardship pharmacist:  []  Elenor Quinones, Pharm.D. []  Heide Guile, Pharm.D., BCPS AQ-ID [x]  Parks Neptune, Pharm.D., BCPS []  Alycia Rossetti, Pharm.D., BCPS []  [place], Pharm.D., BCPS, AAHIVP []  Legrand Como, Pharm.D., BCPS, AAHIVP []  Salome Arnt, PharmD, BCPS []  Dimitri Ped, PharmD, BCPS []  Vincenza Hews, PharmD, BCPS  Positive urine culture  Admitted to [facility] and treated.  Harlon Flor [facility] 04/06/2017, 11:03 AM

## 2017-04-08 ENCOUNTER — Encounter: Payer: Self-pay | Admitting: Internal Medicine

## 2017-04-10 LAB — BASIC METABOLIC PANEL
BUN: 25 — AB (ref 4–21)
Creatinine: 1.2 — AB (ref 0.5–1.1)
GLUCOSE: 78
Potassium: 4.7 (ref 3.4–5.3)
Sodium: 140 (ref 137–147)

## 2017-04-10 LAB — CBC AND DIFFERENTIAL
HCT: 27 — AB (ref 36–46)
Hemoglobin: 8.3 — AB (ref 12.0–16.0)
PLATELETS: 182 (ref 150–399)
WBC: 7.5

## 2017-04-14 ENCOUNTER — Encounter: Payer: Self-pay | Admitting: Vascular Surgery

## 2017-04-19 ENCOUNTER — Ambulatory Visit (INDEPENDENT_AMBULATORY_CARE_PROVIDER_SITE_OTHER): Payer: Medicare Other | Admitting: Orthopedic Surgery

## 2017-04-19 ENCOUNTER — Ambulatory Visit (INDEPENDENT_AMBULATORY_CARE_PROVIDER_SITE_OTHER): Payer: Medicare Other

## 2017-04-19 ENCOUNTER — Encounter (INDEPENDENT_AMBULATORY_CARE_PROVIDER_SITE_OTHER): Payer: Self-pay | Admitting: Orthopedic Surgery

## 2017-04-19 DIAGNOSIS — S82101D Unspecified fracture of upper end of right tibia, subsequent encounter for closed fracture with routine healing: Secondary | ICD-10-CM

## 2017-04-19 NOTE — Progress Notes (Signed)
Post-Op Visit Note   Patient: Shari Mcmahon           Date of Birth: August 09, 1927           MRN: [id number] Visit Date: 04/19/2017 PCP: Biagio Borg, MD   Assessment & Plan:  Chief Complaint:  Chief Complaint  Patient presents with  . Right Knee - Fracture, Follow-up   Visit Diagnoses:  1. Closed fracture of proximal end of right tibia with routine healing, unspecified fracture morphology, subsequent encounter     Plan: [name] is an 83 year old patient with right tibial plateau fracture.  She's been nonweightbearing at a nursing home.  On exam she has decreased swelling and good ankle dorsi flexion plantar flexion.  Compartments are soft.  Range of motion is only to about 45.  Radiographs show no change in fracture alignment.  Plan is 3 more weeks of nonweightbearing but I did want physical therapy to start doing straight leg raises and quad strengthening along with knee range of motion exercises on a daily basis all see her back in 3 weeks repeat radiographs and likely initiation of walking at that time.  Follow-Up Instructions: No Follow-up on file.   Orders:  Orders Placed This Encounter  Procedures  . XR Tibia/Fibula Right   No orders of the defined types were placed in this encounter.   Imaging: Xr Tibia/fibula Right  Result Date: 04/19/2017 AP lateral right tib-fib shows significant osteoarthritis of the knee joint with minimal to no displacement of proximal tibial plateau fracture.  No significant change in alignment compared to previous radiographs   PMFS History: Patient Active Problem List   Diagnosis Date Noted  . Hypokalemia   . Fall 04/03/2017  . Closed right tibial fracture 04/03/2017  . Tibial fracture 04/03/2017  . Acute cystitis without hematuria   . Encounter for well adult exam with abnormal findings 02/02/2017  . Urinary incontinence 02/02/2017  . Seizure-like activity ([place])   . Syncope 07/07/2016  . Atherosclerosis of native arteries of the  extremities with ulceration ([place]) 05/11/2016    Class: Chronic  . Weakness generalized   . Hypernatremia   . Aspiration pneumonia ([place])   . AKI (acute kidney injury) ([place])   . Ulcer of left heel and midfoot with necrosis of muscle ([place]) 01/17/2016  . Type II diabetes mellitus with peripheral circulatory disorder ([place]) 01/01/2016  . Pressure ulcer 01/01/2016  . Peripheral autonomic neuropathy due to diabetes mellitus ([place]) 11/15/2015  . GERD without esophagitis 10/30/2015  . Alzheimer's dementia 07/28/2015  . Fracture of right femur ([employer]) 07/28/2015  . Arthritis of knee 05/19/2015  . Anemia 02/25/2015  . Loss of weight 09/17/2014  . Chronic pain syndrome 06/16/2014  . Primary localized osteoarthrosis, lower leg 01/08/2014  . Rotator cuff tear arthropathy of both shoulders 12/09/2013  . CONSTIPATION, CHRONIC 10/15/2010  . Hyperlipidemia 01/27/2010  . Gastroparesis 12/24/2009  . Dysphagia 12/24/2009  . E. coli UTI 12/15/2009  . DEPRESSION 06/17/2009  . BRADYCARDIA, CHRONIC 12/17/2008  . Peripheral neuropathy ([place]) 06/19/2008  . Lung nodules 03/03/2008  . GENERALIZED OSTEOARTHROSIS INVOLVING HAND 10/03/2007  . Gout 09/19/2007  . GLAUCOMA 09/19/2007  . Essential hypertension 09/19/2007   Past Medical History:  Diagnosis Date  . ABDOMINAL PAIN, CHRONIC 04/07/2008  . Aneurysm of thoracic aorta ([place])    "found 03/2015; not big enough to repair" (07/07/2016)  . ARTHRITIS 03/03/2008  . Arthritis    "shoulders, knees, hands" (07/07/2016)  . Arthritis of knee, degenerative 10/19/2011  .  Aspiration pneumonia ([place])   . BRADYCARDIA, CHRONIC 12/17/2008  . CARDIAC MURMUR 02/01/2010  . Complication of anesthesia    "she retains carbon dioxide; sleeps too long" (07/07/2016)  . CONSTIPATION, CHRONIC 10/15/2010  . DEGENERATIVE JOINT DISEASE, KNEES, BILATERAL 12/07/2007  . DEPRESSION 06/17/2009  . DIABETES MELLITUS, TYPE II 09/19/2007   DIET CONTROL   . DVT (deep venous thrombosis) (HCC) BLE  .  DYSPHAGIA UNSPECIFIED 12/24/2009  . Gastroparesis 12/24/2009  . GENERALIZED OSTEOARTHROSIS INVOLVING HAND 10/03/2007  . GERD (gastroesophageal reflux disease)   . GLAUCOMA 09/19/2007  . GOUT 09/19/2007  . GOUTY ARTHROPATHY UNSPECIFIED 02/17/2009  . Hepatitis   . HYPERLIPIDEMIA 01/27/2010  . HYPERTENSION 09/19/2007  . LUNG NODULE 03/03/2008  . MENOPAUSAL DISORDER 12/17/2008  . PERIPHERAL NEUROPATHY 06/19/2008  . PERSONAL HISTORY MALIGNANT NEOPLASM STOMACH 09/19/2007  . 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 STOMACH CANCER 03/03/2008  . UTI 12/15/2009    Family History  Problem Relation Age of Onset  . Uterine cancer Mother   . Stroke Father        Hemorrhagic  . Cancer Sister        breast cancer and one sister with uterine cancer and one sister with ovarian cancerr  . Colon cancer Maternal Grandmother   . Colon cancer Paternal Grandfather   . Diabetes Other   . Prostate cancer Brother   . Stomach cancer Neg Hx     Past Surgical History:  Procedure Laterality Date  . BALLOON DILATION N/A 07/12/2016   Procedure: BALLOON DILATION;  Surgeon: Manus Gunning, MD;  Location: [facility] ENDOS[facility];  Service: Gastroenterology;  Laterality: N/A;  . BILROTH II PROCEDURE    . CATARACT EXTRACTION W/ INTRAOCULAR LENS  IMPLANT, BILATERAL    . COLONOSCOPY    . ESOPHAGOGASTRODUODENOSCOPY N/A 07/12/2016   Procedure: ESOPHAGOGASTRODUODENOSCOPY (EGD);  Surgeon: Manus Gunning, MD;  Location: [place];  Service: Gastroenterology;  Laterality: N/A;  . FRACTURE SURGERY    . HIP FRACTURE SURGERY Right 03/2015   Social History   Occupational History  . retired Retired   Social History Main Topics  . Smoking status: Former Smoker    Packs/day: 0.50    Years: 25.00    Types: Cigarettes  . Smokeless tobacco: Never Used     Comment: "stopped when she was 83 years old"  . Alcohol use 0.0 oz/week     Comment: 07/07/2016 "nothing in years; stopped maybe in the  1980s"  .  Drug use: No  . Sexual activity: Not on file

## 2017-04-26 ENCOUNTER — Ambulatory Visit: Payer: Medicare Other | Admitting: Family

## 2017-04-26 ENCOUNTER — Encounter (HOSPITAL_COMMUNITY): Payer: Medicare Other

## 2017-05-05 ENCOUNTER — Non-Acute Institutional Stay (SKILLED_NURSING_FACILITY): Payer: Medicare Other | Admitting: Internal Medicine

## 2017-05-05 ENCOUNTER — Encounter: Payer: Self-pay | Admitting: Internal Medicine

## 2017-05-05 DIAGNOSIS — H409 Unspecified glaucoma: Secondary | ICD-10-CM

## 2017-05-05 DIAGNOSIS — I10 Essential (primary) hypertension: Secondary | ICD-10-CM

## 2017-05-05 DIAGNOSIS — K219 Gastro-esophageal reflux disease without esophagitis: Secondary | ICD-10-CM

## 2017-05-05 NOTE — Progress Notes (Signed)
Location:  [place] Room Number: [place]:  SNF [phone])  Provider: Noah Delaine. Sheppard Coil, MD  Biagio Borg, MD  Patient Care Team: Biagio Borg, MD as PCP - General (Internal Medicine) Pa, [place] (Specialist) Newt Minion, MD as Consulting Physician (Orthopedic Surgery) Meredith Pel, MD as Consulting Physician (Orthopedic Surgery)  Extended Emergency Contact Information Primary Emergency Contact: [facility] May Address: [place], [place] [address] [place] Phone: [phone] Mobile Phone: [phone] Relation: Daughter Secondary Emergency Contact: Murphy,Jacqueline Address: [place], [place] Phone: [phone] Mobile Phone: [phone] Relation: Daughter    Allergies: Penicillins; Voltaren [diclofenac sodium]; Dulcolax [bisacodyl]; Duloxetine hcl; Oxycodone; and Timolol  Chief Complaint  Patient presents with  . Medical Management of Chronic Issues    routine visit    HPI: Patient is 83 y.o. female who Is being seen for routine issues of GERD, glaucoma, and hypertension.  Past Medical History:  Diagnosis Date  . ABDOMINAL PAIN, CHRONIC 04/07/2008  . Alzheimer's dementia 07/28/2015  . Anemia 02/25/2015  . Aneurysm of thoracic aorta ([place])    "found 03/2015; not big enough to repair" (07/07/2016)  . ARTHRITIS 03/03/2008  . Arthritis    "shoulders, knees, hands" (07/07/2016)  . Arthritis of knee, degenerative 10/19/2011  . Aspiration pneumonia ([place])   . Atherosclerosis of native arteries of the extremities with ulceration ([place]) 05/11/2016  . BRADYCARDIA, CHRONIC 12/17/2008  . CARDIAC MURMUR 02/01/2010  . Chronic pain syndrome 06/16/2014  . Closed right tibial fracture 04/03/2017  . Complication of anesthesia    "she retains carbon dioxide; sleeps too long" (07/07/2016)  . CONSTIPATION, CHRONIC 10/15/2010  .  DEGENERATIVE JOINT DISEASE, KNEES, BILATERAL 12/07/2007  . DEPRESSION 06/17/2009  . DIABETES MELLITUS, TYPE II 09/19/2007   DIET CONTROL   . DVT (deep venous thrombosis) (HCC) BLE  . Dysphagia 12/24/2009   Qualifier: Diagnosis of  By: Varney Daily RN, Butch Penny    . DYSPHAGIA UNSPECIFIED 12/24/2009  . E. coli UTI 12/15/2009   Qualifier: Diagnosis of  By: Jenny Reichmann MD, Hunt Oris   . Fracture of right femur [facility]) 07/28/2015   S/p ORIF in 03/2015   . Gastroparesis 12/24/2009  . GENERALIZED OSTEOARTHROSIS INVOLVING HAND 10/03/2007  . GERD (gastroesophageal reflux disease)   . GERD without esophagitis 10/30/2015  . GLAUCOMA 09/19/2007  . GOUT 09/19/2007  . GOUTY ARTHROPATHY UNSPECIFIED 02/17/2009  . Hepatitis   . HYPERLIPIDEMIA 01/27/2010  . HYPERTENSION 09/19/2007  . Hypokalemia   . LUNG NODULE 03/03/2008  . Lung nodules 03/03/2008   Qualifier: Diagnosis of  By: Marland Mcalpine    . MENOPAUSAL DISORDER 12/17/2008  . Peripheral autonomic neuropathy due to diabetes mellitus ([place]) 11/15/2015  . PERIPHERAL NEUROPATHY 06/19/2008  . Peripheral neuropathy ([place]) 06/19/2008   With chronic pain   . PERSONAL HISTORY MALIGNANT NEOPLASM STOMACH 09/19/2007  . 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 Rotator cuff tear arthropathy of both shoulders 12/09/2013   Injected under ultrasound guidance December 09, 2013 Left shoulder injected under ultrasound April 07, 2014 Left shoulder injected again in June 20, 2014   . STOMACH CANCER 03/03/2008  . Syncope 07/07/2016  . Tibial fracture 04/03/2017  . Type II diabetes mellitus with peripheral circulatory disorder ([place]) 01/01/2016  . Ulcer of left heel and midfoot with  necrosis of muscle ([place]) 01/17/2016  . Urinary incontinence 02/02/2017  . UTI 12/15/2009    Past Surgical History:  Procedure Laterality Date  . BALLOON DILATION N/A 07/12/2016   Procedure: BALLOON DILATION;  Surgeon: Manus Gunning, MD;  Location: [facility] ENDOSCOPY;  Service: Gastroenterology;   Laterality: N/A;  . BILROTH II PROCEDURE    . CATARACT EXTRACTION W/ INTRAOCULAR LENS  IMPLANT, BILATERAL    . COLONOSCOPY    . ESOPHAGOGASTRODUODENOSCOPY N/A 07/12/2016   Procedure: ESOPHAGOGASTRODUODENOSCOPY (EGD);  Surgeon: Manus Gunning, MD;  Location: [place];  Service: Gastroenterology;  Laterality: N/A;  . FRACTURE SURGERY    . HIP FRACTURE SURGERY Right 03/2015    Allergies as of 05/05/2017      Reactions   Penicillins Shortness Of Breath, Swelling   Has patient had a PCN reaction causing immediate rash, facial/tongue/throat swelling, SOB or lightheadedness with hypotension: yes Has patient had a PCN reaction causing severe rash involving mucus membranes or skin necrosis: no Has patient had a PCN reaction that required hospitalization: unknown Has patient had a PCN reaction occurring within the last 10 years: no If all of the above answers are "NO", then may proceed with Cephalosporin use.   Voltaren [diclofenac Sodium] Other (See Comments)   Stomach irritation   Dulcolax [bisacodyl]    Unknown    Duloxetine Hcl Other (See Comments)   Made patient not want to eat or drink   Oxycodone Other (See Comments)   hallucinations   Timolol    REACTION: bradycardia worse      Medication List       Accurate as of 05/05/17 11:59 PM. Always use your most recent med list.          acetaminophen 325 MG tablet Commonly known as:  TYLENOL Take 2 tablets (650 mg total) by mouth 3 (three) times daily.   amLODipine 5 MG tablet Commonly known as:  NORVASC TAKE 1 TABLET (5 MG TOTAL) BY MOUTH DAILY.   colchicine 0.6 MG tablet TAKE 1 TABLET (0.6 MG TOTAL) BY MOUTH DAILY AS NEEDED (GOUT PAIN.).   docusate sodium 100 MG capsule Commonly known as:  COLACE Take 1 capsule (100 mg total) by mouth 2 (two) times daily as needed for mild constipation.   donepezil 10 MG tablet Commonly known as:  ARICEPT TAKE 1 TABLET (10 MG TOTAL) BY MOUTH AT BEDTIME.     HYDROcodone-acetaminophen 5-325 MG tablet Commonly known as:  NORCO/VICODIN Take by mouth. 1 tablet for moderate pain, 2 tablets for severe pain   lactose free nutrition Liqd Take 237 mLs by mouth 3 (three) times daily between meals.   latanoprost 0.005 % ophthalmic solution Commonly known as:  XALATAN PLACE 1 DROP INTO BOTH EYES AT BEDTIME.   multivitamin with minerals tablet Take 1 tablet by mouth daily.   NAMENDA XR 28 MG Cp24 24 hr capsule Generic drug:  memantine TAKE 1 CAPSULE (28 MG TOTAL) BY MOUTH DAILY.   omeprazole 40 MG capsule Commonly known as:  PRILOSEC TAKE 1 CAPSULE (40 MG TOTAL) BY MOUTH DAILY.   ULORIC 40 MG tablet Generic drug:  febuxostat TAKE 1 TABLET (40 MG TOTAL) BY MOUTH DAILY.       No orders of the defined types were placed in this encounter.   Immunization History  Administered Date(s) Administered  . H1N1 12/17/2008  . Influenza Whole 08/27/2007, 09/30/2008, 07/31/2009, 07/31/2010  . Influenza-Unspecified 07/29/2015  . PPD Test 04/04/2017  . Pneumococcal Conjugate-13 11/22/2013  . Pneumococcal Polysaccharide-23  10/03/2007, 10/19/2011  . Td 12/15/2009  . Tdap 04/02/2017    Social History  Substance Use Topics  . Smoking status: Former Smoker    Packs/day: 0.50    Years: 25.00    Types: Cigarettes    Quit date: 11/01/1983  . Smokeless tobacco: Never Used     Comment: "stopped when she was 83 years old"  . Alcohol use No     Comment: 07/07/2016 "nothing in years; stopped maybe in the  1980s"    Review of Systems  DATA OBTAINED: from patient, nurse GENERAL:  no fevers, fatigue, appetite changes SKIN: No itching, rash HEENT: No complaint RESPIRATORY: No cough, wheezing, SOB CARDIAC: No chest pain, palpitations, lower extremity edema  GI: No abdominal pain, No N/V/D or constipation, No heartburn or reflux  GU: No dysuria, frequency or urgency, or incontinence  MUSCULOSKELETAL: No unrelieved bone/joint pain NEUROLOGIC: No  headache, dizziness  PSYCHIATRIC: No overt anxiety or sadness  Vitals:   05/05/17 1042  BP: (!) 119/50  Pulse: (!) 54  Resp: 18  Temp: 97.8 F (36.6 C)   Body mass index is 21.11 kg/m. Physical Exam  GENERAL APPEARANCE: Alert, No acute distress  SKIN: No diaphoresis rash HEENT: Unremarkable RESPIRATORY: Breathing is even, unlabored. Lung sounds are clear   CARDIOVASCULAR: Heart RRR no murmurs, rubs or gallops. No peripheral edema  GASTROINTESTINAL: Abdomen is soft, non-tender, not distended w/ normal bowel sounds.  GENITOURINARY: Bladder non tender, not distended  MUSCULOSKELETAL: No abnormal joints or musculature NEUROLOGIC: Cranial nerves 2-12 grossly intact. Moves all extremities PSYCHIATRIC: Mood and affect with dementia, no behavioral issues  Patient Active Problem List   Diagnosis Date Noted  . Glaucoma, bilateral 05/21/2017  . Hypokalemia   . Fall 04/03/2017  . Closed right tibial fracture 04/03/2017  . Tibial fracture 04/03/2017  . Acute cystitis without hematuria   . Encounter for well adult exam with abnormal findings 02/02/2017  . Urinary incontinence 02/02/2017  . Seizure-like activity ([place])   . Syncope 07/07/2016  . Atherosclerosis of native arteries of the extremities with ulceration ([place]) 05/11/2016    Class: Chronic  . Weakness generalized   . Hypernatremia   . Aspiration pneumonia ([place])   . AKI (acute kidney injury) ([place])   . Ulcer of left heel and midfoot with necrosis of muscle ([place]) 01/17/2016  . Type II diabetes mellitus with peripheral circulatory disorder ([place]) 01/01/2016  . Pressure ulcer 01/01/2016  . Peripheral autonomic neuropathy due to diabetes mellitus ([place]) 11/15/2015  . GERD without esophagitis 10/30/2015  . Alzheimer's dementia 07/28/2015  . Fracture of right femur ([place]) 07/28/2015  . Arthritis of knee 05/19/2015  . Anemia 02/25/2015  . Loss of weight 09/17/2014  . Chronic pain syndrome 06/16/2014  . Primary localized  osteoarthrosis, lower leg 01/08/2014  . Rotator cuff tear arthropathy of both shoulders 12/09/2013  . CONSTIPATION, CHRONIC 10/15/2010  . Hyperlipidemia 01/27/2010  . Gastroparesis 12/24/2009  . Dysphagia 12/24/2009  . E. coli UTI 12/15/2009  . DEPRESSION 06/17/2009  . BRADYCARDIA, CHRONIC 12/17/2008  . Peripheral neuropathy ([place]) 06/19/2008  . Lung nodules 03/03/2008  . GENERALIZED OSTEOARTHROSIS INVOLVING HAND 10/03/2007  . Gout 09/19/2007  . GLAUCOMA 09/19/2007  . Essential hypertension 09/19/2007    CMP     Component Value Date/Time   NA 140 04/10/2017   K 4.7 04/10/2017   CL 108 04/04/2017 0438   CO2 26 04/04/2017 0438   GLUCOSE 82 04/04/2017 0438   BUN 25 (A) 04/10/2017   CREATININE 1.2 (A)  04/10/2017   CREATININE 1.11 (H) 04/04/2017 0438   CALCIUM 8.1 (L) 04/04/2017 0438   PROT 7.4 02/02/2017 1449   ALBUMIN 3.6 02/02/2017 1449   AST 22 02/02/2017 1449   ALT 13 02/02/2017 1449   ALKPHOS 94 02/02/2017 1449   BILITOT 0.3 02/02/2017 1449   GFRNONAA 43 (L) 04/04/2017 0438   GFRAA 50 (L) 04/04/2017 0438    Recent Labs  07/07/16 1431  02/02/17 1449  04/03/17 1959 04/04/17 0438 04/10/17  NA  --   < > 141  < > 145  145 140  140 140  K  --   < > 3.9  --  3.5  3.5 3.3* 4.7  CL  --   < > 107  --  110 108  --   CO2  --   < > 29  --  28 26  --   GLUCOSE  --   < > 74  --  88 82  --   BUN  --   < > 17  < > 21  21* 20  20 25*  CREATININE  --   < > 0.97  < > 1.14*  1.1 1.11*  1.1 1.2*  CALCIUM  --   < > 9.2  --  8.6* 8.1*  --   MG 1.6*  --   --   --   --   --   --   < > = values in this interval not displayed.  Recent Labs  07/07/16 1431 07/08/16 0203 02/02/17 1449  AST 22 19 22   ALT 11* 9* 13  ALKPHOS 93 73 94  BILITOT 0.4 0.5 0.3  PROT 6.3* 4.9* 7.4  ALBUMIN 3.0* 2.3* 3.6    Recent Labs  07/07/16 1027  02/02/17 1449  04/03/17 1959 04/04/17 0054 04/04/17 0438 04/10/17  WBC 7.1  < > 6.7  < > 7.8  7.8 9.1  9.1 7.5  7.5 7.5  NEUTROABS 5.1  --   4.1  --  5.3  --   --   --   HGB 12.2  < > 11.3*  --  8.6*  8.6* 8.9*  8.9* 7.9* 8.3*  HCT 36.7  < > 34.3*  --  25.3*  25* 26.6*  27* 24.2* 27*  MCV 100.0  < > 103.2*  --  97.3 97.8 98.0  --   PLT 148*  < > 134.0*  --  100*  100* 106*  106* 88* 182  < > = values in this interval not displayed.  Recent Labs  02/02/17 1449  CHOL 180  LDLCALC 89  TRIG 90.0   Lab Results  Component Value Date   MICROALBUR 27.2 (H) 02/02/2017   Lab Results  Component Value Date   TSH 1.07 02/02/2017   Lab Results  Component Value Date   HGBA1C 4.1 05/08/2017   Lab Results  Component Value Date   CHOL 180 02/02/2017   HDL 72.70 02/02/2017   LDLCALC 89 02/02/2017   LDLDIRECT 124.3 04/09/2010   TRIG 90.0 02/02/2017   CHOLHDL 2 02/02/2017    Significant Diagnostic Results in last 30 days:  No results found.  Assessment and Plan  GERD without esophagitis No reports of reflux or aspiration; plan to continue omeprazole 40 mg by mouth daily  Glaucoma, bilateral Chronic and stable; plan to continue xalantan 0.005% drops to each eye daily at bedtime  Essential hypertension Controlled on Norvasc 5 mg by mouth daily; plan to continue current regimen  Noah Delaine. Sheppard Coil, MD

## 2017-05-08 LAB — HEMOGLOBIN A1C: Hemoglobin A1C: 4.1

## 2017-05-09 ENCOUNTER — Other Ambulatory Visit: Payer: Self-pay | Admitting: *Deleted

## 2017-05-09 ENCOUNTER — Other Ambulatory Visit: Payer: Self-pay

## 2017-05-09 NOTE — Patient Outreach (Signed)
[facility]) Care Management  05/09/2017  Shari Mcmahon February 19, 1927 [id number]  Communication with Vikki Ports, SW at facility.  She reports patient will be LTC at facility.   Plan to sign off at this time as patient does not have any plans for discharge home, no [facility] Community care management needs   Royetta Crochet. [name], RN, BSN, [place] [phone]) Business Cell  [phone]) Toll Free Office

## 2017-05-21 ENCOUNTER — Encounter: Payer: Self-pay | Admitting: Internal Medicine

## 2017-05-21 DIAGNOSIS — H409 Unspecified glaucoma: Secondary | ICD-10-CM | POA: Insufficient documentation

## 2017-05-21 NOTE — Assessment & Plan Note (Signed)
No reports of reflux or aspiration; plan to continue omeprazole 40 mg by mouth daily

## 2017-05-21 NOTE — Assessment & Plan Note (Signed)
Chronic and stable; plan to continue xalantan 0.005% drops to each eye daily at bedtime

## 2017-05-21 NOTE — Assessment & Plan Note (Signed)
Controlled on Norvasc 5 mg by mouth daily; plan to continue current regimen

## 2017-05-26 ENCOUNTER — Ambulatory Visit (INDEPENDENT_AMBULATORY_CARE_PROVIDER_SITE_OTHER): Payer: Medicare Other

## 2017-05-26 ENCOUNTER — Ambulatory Visit (INDEPENDENT_AMBULATORY_CARE_PROVIDER_SITE_OTHER): Payer: Medicare Other | Admitting: Orthopedic Surgery

## 2017-05-26 DIAGNOSIS — S82101D Unspecified fracture of upper end of right tibia, subsequent encounter for closed fracture with routine healing: Secondary | ICD-10-CM

## 2017-05-26 NOTE — Progress Notes (Signed)
Post-Op Visit Note   Patient: Shari Mcmahon           Date of Birth: August 01, 1927           MRN: [id number] Visit Date: 05/26/2017 PCP: Biagio Borg, MD   Assessment & Plan:  Chief Complaint:  Chief Complaint  Patient presents with  . Right Leg - Follow-up, Fracture   Visit Diagnoses:  1. Closed fracture of proximal end of right tibia with routine healing, unspecified fracture morphology, subsequent encounter     Plan: Neria is now 6 weeks out right proximal tibia fracture.  Ankle dorsiflexion plantar flexion is observed.  Radiographs show callus formation.  Plan is partial weightbearing for 2 weeks followed by full weightbearing.  Follow-up in 4 weeks for final check.  Significant arthritis is present in the face of this tibial plateau fracture.  Pain resolution is not likely to occur anytime in the near future.  Follow-Up Instructions: Return in about 4 weeks (around 06/23/2017).   Orders:  Orders Placed This Encounter  Procedures  . XR Tibia/Fibula Right   No orders of the defined types were placed in this encounter.   Imaging: Xr Tibia/fibula Right  Result Date: 05/26/2017 AP lateral right tib-fib reviewed.  Healing of the proximal tibia fracture has occurred.  Significant tricompartment arthritis is present.  Calcification is noted around the proximal tibial fracture site.   PMFS History: Patient Active Problem List   Diagnosis Date Noted  . Glaucoma, bilateral 05/21/2017  . Hypokalemia   . Fall 04/03/2017  . Closed right tibial fracture 04/03/2017  . Tibial fracture 04/03/2017  . Acute cystitis without hematuria   . Encounter for well adult exam with abnormal findings 02/02/2017  . Urinary incontinence 02/02/2017  . Seizure-like activity ([place])   . Syncope 07/07/2016  . Atherosclerosis of native arteries of the extremities with ulceration ([place]) 05/11/2016    Class: Chronic  . Weakness generalized   . Hypernatremia   . Aspiration pneumonia ([place])   . AKI  (acute kidney injury) ([place])   . Ulcer of left heel and midfoot with necrosis of muscle ([place]) 01/17/2016  . Type II diabetes mellitus with peripheral circulatory disorder ([place]) 01/01/2016  . Pressure ulcer 01/01/2016  . Peripheral autonomic neuropathy due to diabetes mellitus ([place]) 11/15/2015  . GERD without esophagitis 10/30/2015  . Alzheimer's dementia 07/28/2015  . Fracture of right femur ([place]) 07/28/2015  . Arthritis of knee 05/19/2015  . Anemia 02/25/2015  . Loss of weight 09/17/2014  . Chronic pain syndrome 06/16/2014  . Primary localized osteoarthrosis, lower leg 01/08/2014  . Rotator cuff tear arthropathy of both shoulders 12/09/2013  . CONSTIPATION, CHRONIC 10/15/2010  . Hyperlipidemia 01/27/2010  . Gastroparesis 12/24/2009  . Dysphagia 12/24/2009  . E. coli UTI 12/15/2009  . DEPRESSION 06/17/2009  . BRADYCARDIA, CHRONIC 12/17/2008  . Peripheral neuropathy ([place]) 06/19/2008  . Lung nodules 03/03/2008  . GENERALIZED OSTEOARTHROSIS INVOLVING HAND 10/03/2007  . Gout 09/19/2007  . GLAUCOMA 09/19/2007  . Essential hypertension 09/19/2007   Past Medical History:  Diagnosis Date  . ABDOMINAL PAIN, CHRONIC 04/07/2008  . Alzheimer's dementia 07/28/2015  . Anemia 02/25/2015  . Aneurysm of thoracic aorta ([place])    "found 03/2015; not big enough to repair" (07/07/2016)  . ARTHRITIS 03/03/2008  . Arthritis    "shoulders, knees, hands" (07/07/2016)  . Arthritis of knee, degenerative 10/19/2011  . Aspiration pneumonia ([place])   . Atherosclerosis of native arteries of the extremities with ulceration ([place]) 05/11/2016  . BRADYCARDIA,  CHRONIC 12/17/2008  . CARDIAC MURMUR 02/01/2010  . Chronic pain syndrome 06/16/2014  . Closed right tibial fracture 04/03/2017  . Complication of anesthesia    "she retains carbon dioxide; sleeps too long" (07/07/2016)  . CONSTIPATION, CHRONIC 10/15/2010  . DEGENERATIVE JOINT DISEASE, KNEES, BILATERAL 12/07/2007  . DEPRESSION 06/17/2009  . DIABETES MELLITUS, TYPE II  09/19/2007   DIET CONTROL   . DVT (deep venous thrombosis) (HCC) BLE  . Dysphagia 12/24/2009   Qualifier: Diagnosis of  By: Varney Daily RN, Butch Penny    . DYSPHAGIA UNSPECIFIED 12/24/2009  . E. coli UTI 12/15/2009   Qualifier: Diagnosis of  By: Jenny Reichmann MD, Hunt Oris   . Fracture of right femur [facility]) 07/28/2015   S/p ORIF in 03/2015   . Gastroparesis 12/24/2009  . GENERALIZED OSTEOARTHROSIS INVOLVING HAND 10/03/2007  . GERD (gastroesophageal reflux disease)   . GERD without esophagitis 10/30/2015  . GLAUCOMA 09/19/2007  . GOUT 09/19/2007  . GOUTY ARTHROPATHY UNSPECIFIED 02/17/2009  . Hepatitis   . HYPERLIPIDEMIA 01/27/2010  . HYPERTENSION 09/19/2007  . Hypokalemia   . LUNG NODULE 03/03/2008  . Lung nodules 03/03/2008   Qualifier: Diagnosis of  By: Marland Mcalpine    . MENOPAUSAL DISORDER 12/17/2008  . Peripheral autonomic neuropathy due to diabetes mellitus ([place]) 11/15/2015  . PERIPHERAL NEUROPATHY 06/19/2008  . Peripheral neuropathy ([place]) 06/19/2008   With chronic pain   . PERSONAL HISTORY MALIGNANT NEOPLASM STOMACH 09/19/2007  . 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 Rotator cuff tear arthropathy of both shoulders 12/09/2013   Injected under ultrasound guidance December 09, 2013 Left shoulder injected under ultrasound April 07, 2014 Left shoulder injected again in June 20, 2014   . STOMACH CANCER 03/03/2008  . Syncope 07/07/2016  . Tibial fracture 04/03/2017  . Type II diabetes mellitus with peripheral circulatory disorder ([place]) 01/01/2016  . Ulcer of left heel and midfoot with necrosis of muscle ([place]) 01/17/2016  . Urinary incontinence 02/02/2017  . UTI 12/15/2009    Family History  Problem Relation Age of Onset  . Uterine cancer Mother   . Stroke Father        Hemorrhagic  . Cancer Sister        breast cancer and one sister with uterine cancer and one sister with ovarian cancerr  . Colon cancer Maternal Grandmother   . Colon cancer Paternal Grandfather   . Diabetes Other   .  Prostate cancer Brother   . Stomach cancer Neg Hx     Past Surgical History:  Procedure Laterality Date  . BALLOON DILATION N/A 07/12/2016   Procedure: BALLOON DILATION;  Surgeon: Manus Gunning, MD;  [facility] ENDOSCOPY;  Service: Gastroenterology;  Laterality: N/A;  . BILROTH II PROCEDURE    . CATARACT EXTRACTION W/ INTRAOCULAR LENS  IMPLANT, BILATERAL    . COLONOSCOPY    . ESOPHAGOGASTRODUODENOSCOPY N/A 07/12/2016   Procedure: ESOPHAGOGASTRODUODENOSCOPY (EGD);  Surgeon: Manus Gunning, MD;  Location: [place];  Service: Gastroenterology;  Laterality: N/A;  . FRACTURE SURGERY    . HIP FRACTURE SURGERY Right 03/2015   Social History   Occupational History  . retired Quarry manager Retired   Social History Main Topics  . Smoking status: Former Smoker    Packs/day: 0.50    Years: 25.00    Types: Cigarettes    Quit date: 11/01/1983  . Smokeless tobacco: Never Used     Comment: "stopped when she was 83 years old"  . Alcohol use No  Comment: 07/07/2016 "nothing in years; stopped maybe in the  1980s"  . Drug use: No  . Sexual activity: No

## 2017-05-28 DIAGNOSIS — M6281 Muscle weakness (generalized): Secondary | ICD-10-CM | POA: Diagnosis not present

## 2017-05-28 DIAGNOSIS — S82201D Unspecified fracture of shaft of right tibia, subsequent encounter for closed fracture with routine healing: Secondary | ICD-10-CM | POA: Diagnosis not present

## 2017-05-28 DIAGNOSIS — R2681 Unsteadiness on feet: Secondary | ICD-10-CM | POA: Diagnosis not present

## 2017-05-29 DIAGNOSIS — R2681 Unsteadiness on feet: Secondary | ICD-10-CM | POA: Diagnosis not present

## 2017-05-29 DIAGNOSIS — S82201D Unspecified fracture of shaft of right tibia, subsequent encounter for closed fracture with routine healing: Secondary | ICD-10-CM | POA: Diagnosis not present

## 2017-05-29 DIAGNOSIS — M6281 Muscle weakness (generalized): Secondary | ICD-10-CM | POA: Diagnosis not present

## 2017-05-30 DIAGNOSIS — M6281 Muscle weakness (generalized): Secondary | ICD-10-CM | POA: Diagnosis not present

## 2017-05-30 DIAGNOSIS — R2681 Unsteadiness on feet: Secondary | ICD-10-CM | POA: Diagnosis not present

## 2017-05-30 DIAGNOSIS — S82201D Unspecified fracture of shaft of right tibia, subsequent encounter for closed fracture with routine healing: Secondary | ICD-10-CM | POA: Diagnosis not present

## 2017-05-31 DIAGNOSIS — R2681 Unsteadiness on feet: Secondary | ICD-10-CM | POA: Diagnosis not present

## 2017-05-31 DIAGNOSIS — M1000 Idiopathic gout, unspecified site: Secondary | ICD-10-CM | POA: Diagnosis not present

## 2017-05-31 DIAGNOSIS — M6281 Muscle weakness (generalized): Secondary | ICD-10-CM | POA: Diagnosis not present

## 2017-05-31 DIAGNOSIS — S82201D Unspecified fracture of shaft of right tibia, subsequent encounter for closed fracture with routine healing: Secondary | ICD-10-CM | POA: Diagnosis not present

## 2017-06-01 DIAGNOSIS — M1000 Idiopathic gout, unspecified site: Secondary | ICD-10-CM | POA: Diagnosis not present

## 2017-06-01 DIAGNOSIS — M6281 Muscle weakness (generalized): Secondary | ICD-10-CM | POA: Diagnosis not present

## 2017-06-01 DIAGNOSIS — R2681 Unsteadiness on feet: Secondary | ICD-10-CM | POA: Diagnosis not present

## 2017-06-01 DIAGNOSIS — S82201D Unspecified fracture of shaft of right tibia, subsequent encounter for closed fracture with routine healing: Secondary | ICD-10-CM | POA: Diagnosis not present

## 2017-06-02 DIAGNOSIS — M1000 Idiopathic gout, unspecified site: Secondary | ICD-10-CM | POA: Diagnosis not present

## 2017-06-02 DIAGNOSIS — R2681 Unsteadiness on feet: Secondary | ICD-10-CM | POA: Diagnosis not present

## 2017-06-02 DIAGNOSIS — M6281 Muscle weakness (generalized): Secondary | ICD-10-CM | POA: Diagnosis not present

## 2017-06-02 DIAGNOSIS — S82201D Unspecified fracture of shaft of right tibia, subsequent encounter for closed fracture with routine healing: Secondary | ICD-10-CM | POA: Diagnosis not present

## 2017-06-04 DIAGNOSIS — M6281 Muscle weakness (generalized): Secondary | ICD-10-CM | POA: Diagnosis not present

## 2017-06-04 DIAGNOSIS — S82201D Unspecified fracture of shaft of right tibia, subsequent encounter for closed fracture with routine healing: Secondary | ICD-10-CM | POA: Diagnosis not present

## 2017-06-04 DIAGNOSIS — R2681 Unsteadiness on feet: Secondary | ICD-10-CM | POA: Diagnosis not present

## 2017-06-04 DIAGNOSIS — M1000 Idiopathic gout, unspecified site: Secondary | ICD-10-CM | POA: Diagnosis not present

## 2017-06-05 DIAGNOSIS — R2681 Unsteadiness on feet: Secondary | ICD-10-CM | POA: Diagnosis not present

## 2017-06-05 DIAGNOSIS — S82201D Unspecified fracture of shaft of right tibia, subsequent encounter for closed fracture with routine healing: Secondary | ICD-10-CM | POA: Diagnosis not present

## 2017-06-05 DIAGNOSIS — M6281 Muscle weakness (generalized): Secondary | ICD-10-CM | POA: Diagnosis not present

## 2017-06-05 DIAGNOSIS — M1000 Idiopathic gout, unspecified site: Secondary | ICD-10-CM | POA: Diagnosis not present

## 2017-06-06 DIAGNOSIS — M1000 Idiopathic gout, unspecified site: Secondary | ICD-10-CM | POA: Diagnosis not present

## 2017-06-06 DIAGNOSIS — R2681 Unsteadiness on feet: Secondary | ICD-10-CM | POA: Diagnosis not present

## 2017-06-06 DIAGNOSIS — M6281 Muscle weakness (generalized): Secondary | ICD-10-CM | POA: Diagnosis not present

## 2017-06-06 DIAGNOSIS — S82201D Unspecified fracture of shaft of right tibia, subsequent encounter for closed fracture with routine healing: Secondary | ICD-10-CM | POA: Diagnosis not present

## 2017-06-07 DIAGNOSIS — R2681 Unsteadiness on feet: Secondary | ICD-10-CM | POA: Diagnosis not present

## 2017-06-07 DIAGNOSIS — M1000 Idiopathic gout, unspecified site: Secondary | ICD-10-CM | POA: Diagnosis not present

## 2017-06-07 DIAGNOSIS — S82201D Unspecified fracture of shaft of right tibia, subsequent encounter for closed fracture with routine healing: Secondary | ICD-10-CM | POA: Diagnosis not present

## 2017-06-07 DIAGNOSIS — M6281 Muscle weakness (generalized): Secondary | ICD-10-CM | POA: Diagnosis not present

## 2017-06-08 ENCOUNTER — Non-Acute Institutional Stay (SKILLED_NURSING_FACILITY): Payer: Medicare Other

## 2017-06-08 DIAGNOSIS — R2681 Unsteadiness on feet: Secondary | ICD-10-CM | POA: Diagnosis not present

## 2017-06-08 DIAGNOSIS — M1000 Idiopathic gout, unspecified site: Secondary | ICD-10-CM | POA: Diagnosis not present

## 2017-06-08 DIAGNOSIS — Z0000 Encounter for general adult medical examination without abnormal findings: Secondary | ICD-10-CM | POA: Diagnosis not present

## 2017-06-08 DIAGNOSIS — S82201D Unspecified fracture of shaft of right tibia, subsequent encounter for closed fracture with routine healing: Secondary | ICD-10-CM | POA: Diagnosis not present

## 2017-06-08 DIAGNOSIS — M6281 Muscle weakness (generalized): Secondary | ICD-10-CM | POA: Diagnosis not present

## 2017-06-08 NOTE — Progress Notes (Signed)
Subjective:   Shari Mcmahon is a 83 y.o. female who presents for an Initial Medicare Annual Wellness Visit at [facility]       Objective:    Today's Vitals   06/08/17 1531  BP: (!) 160/50  Pulse: 60  Temp: 97.6 F (36.4 C)  TempSrc: Oral  SpO2: 97%  Weight: 131 lb (59.4 kg)  Height: 5\' 6"  (1.676 m)  PainSc: 2    Body mass index is 21.14 kg/m.   Current Medications (verified) Outpatient Encounter Prescriptions as of 06/08/2017  Medication Sig  . acetaminophen (TYLENOL) 325 MG tablet Take 2 tablets (650 mg total) by mouth 3 (three) times daily.  Marland Kitchen amLODipine (NORVASC) 5 MG tablet TAKE 1 TABLET (5 MG TOTAL) BY MOUTH DAILY.  Marland Kitchen colchicine 0.6 MG tablet TAKE 1 TABLET (0.6 MG TOTAL) BY MOUTH DAILY AS NEEDED (GOUT PAIN.).  Marland Kitchen docusate sodium (COLACE) 100 MG capsule Take 1 capsule (100 mg total) by mouth 2 (two) times daily as needed for mild constipation.  Marland Kitchen donepezil (ARICEPT) 10 MG tablet TAKE 1 TABLET (10 MG TOTAL) BY MOUTH AT BEDTIME.  Marland Kitchen HYDROcodone-acetaminophen (NORCO/VICODIN) 5-325 MG tablet Take by mouth. 1 tablet for moderate pain, 2 tablets for severe pain   . lactose free nutrition (BOOST PLUS) LIQD Take 237 mLs by mouth 3 (three) times daily between meals.  . latanoprost (XALATAN) 0.005 % ophthalmic solution PLACE 1 DROP INTO BOTH EYES AT BEDTIME.  . Multiple Vitamins-Minerals (MULTIVITAMIN WITH MINERALS) tablet Take 1 tablet by mouth daily.  Marland Kitchen NAMENDA XR 28 MG CP24 24 hr capsule TAKE 1 CAPSULE (28 MG TOTAL) BY MOUTH DAILY.  Marland Kitchen omeprazole (PRILOSEC) 40 MG capsule TAKE 1 CAPSULE (40 MG TOTAL) BY MOUTH DAILY.  Marland Kitchen ULORIC 40 MG tablet TAKE 1 TABLET (40 MG TOTAL) BY MOUTH DAILY.   No facility-administered encounter medications on file as of 06/08/2017.     Allergies (verified) Penicillins; Voltaren [diclofenac sodium]; Dulcolax [bisacodyl]; Duloxetine hcl; Oxycodone; and Timolol   History: Past Medical History:  Diagnosis Date  . ABDOMINAL PAIN, CHRONIC  04/07/2008  . Alzheimer's dementia 07/28/2015  . Anemia 02/25/2015  . Aneurysm of thoracic aorta ([place])    "found 03/2015; not big enough to repair" (07/07/2016)  . ARTHRITIS 03/03/2008  . Arthritis    "shoulders, knees, hands" (07/07/2016)  . Arthritis of knee, degenerative 10/19/2011  . Aspiration pneumonia ([place])   . Atherosclerosis of native arteries of the extremities with ulceration ([place]) 05/11/2016  . BRADYCARDIA, CHRONIC 12/17/2008  . CARDIAC MURMUR 02/01/2010  . Chronic pain syndrome 06/16/2014  . Closed right tibial fracture 04/03/2017  . Complication of anesthesia    "she retains carbon dioxide; sleeps too long" (07/07/2016)  . CONSTIPATION, CHRONIC 10/15/2010  . DEGENERATIVE JOINT DISEASE, KNEES, BILATERAL 12/07/2007  . DEPRESSION 06/17/2009  . DIABETES MELLITUS, TYPE II 09/19/2007   DIET CONTROL   . DVT (deep venous thrombosis) ([facility]) BLE  . Dysphagia 12/24/2009   Qualifier: Diagnosis of  By: Varney Daily RN, Butch Penny    . DYSPHAGIA UNSPECIFIED 12/24/2009  . E. coli UTI 12/15/2009   Qualifier: Diagnosis of  By: Jenny Reichmann MD, Hunt Oris   . Fracture of right femur [facility]) 07/28/2015   S/p ORIF in 03/2015   . Gastroparesis 12/24/2009  . GENERALIZED OSTEOARTHROSIS INVOLVING HAND 10/03/2007  . GERD (gastroesophageal reflux disease)   . GERD without esophagitis 10/30/2015  . GLAUCOMA 09/19/2007  . GOUT 09/19/2007  . GOUTY ARTHROPATHY UNSPECIFIED 02/17/2009  . Hepatitis   .  HYPERLIPIDEMIA 01/27/2010  . HYPERTENSION 09/19/2007  . Hypokalemia   . LUNG NODULE 03/03/2008  . Lung nodules 03/03/2008   Qualifier: Diagnosis of  By: Marland Mcalpine    . MENOPAUSAL DISORDER 12/17/2008  . Peripheral autonomic neuropathy due to diabetes mellitus ([place]) 11/15/2015  . PERIPHERAL NEUROPATHY 06/19/2008  . Peripheral neuropathy ([place]) 06/19/2008   With chronic pain   . PERSONAL HISTORY MALIGNANT NEOPLASM STOMACH 09/19/2007  . 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 Rotator cuff tear arthropathy of both shoulders  12/09/2013   Injected under ultrasound guidance December 09, 2013 Left shoulder injected under ultrasound April 07, 2014 Left shoulder injected again in June 20, 2014   . STOMACH CANCER 03/03/2008  . Syncope 07/07/2016  . Tibial fracture 04/03/2017  . Type II diabetes mellitus with peripheral circulatory disorder ([place]) 01/01/2016  . Ulcer of left heel and midfoot with necrosis of muscle ([place]) 01/17/2016  . Urinary incontinence 02/02/2017  . UTI 12/15/2009   Past Surgical History:  Procedure Laterality Date  . BALLOON DILATION N/A 07/12/2016   Procedure: BALLOON DILATION;  Surgeon: Manus Gunning, MD;  Location: [facility] ENDOSCOPY;  Service: Gastroenterology;  Laterality: N/A;  . BILROTH II PROCEDURE    . CATARACT EXTRACTION W/ INTRAOCULAR LENS  IMPLANT, BILATERAL    . COLONOSCOPY    . ESOPHAGOGASTRODUODENOSCOPY N/A 07/12/2016   Procedure: ESOPHAGOGASTRODUODENOSCOPY (EGD);  Surgeon: Manus Gunning, MD;  Location: [facility];  Service: Gastroenterology;  Laterality: N/A;  . FRACTURE SURGERY    . HIP FRACTURE SURGERY Right 03/2015   Family History  Problem Relation Age of Onset  . Uterine cancer Mother   . Stroke Father        Hemorrhagic  . Cancer Sister        breast cancer and one sister with uterine cancer and one sister with ovarian cancerr  . Colon cancer Maternal Grandmother   . Colon cancer Paternal Grandfather   . Diabetes Other   . Prostate cancer Brother   . Stomach cancer Neg Hx    Social History   Occupational History  . retired Quarry manager Retired   Social History Main Topics  . Smoking status: Former Smoker    Packs/day: 0.50    Years: 25.00    Types: Cigarettes    Quit date: 11/01/1983  . Smokeless tobacco: Never Used     Comment: "stopped when she was 83 years old"  . Alcohol use No     Comment: 07/07/2016 "nothing in years; stopped maybe in the  1980s"  . Drug use: No  . Sexual activity: No    Tobacco Counseling Counseling given: Not Answered   Activities of  Daily Living In your present state of health, do you have any difficulty performing the following activities: 06/08/2017 04/04/2017  Hearing? N N  Vision? Y Y  Comment - -  Difficulty concentrating or making decisions? Tempie Donning  Walking or climbing stairs? Y Y  Dressing or bathing? Y Y  Doing errands, shopping? Tempie Donning  Preparing Food and eating ? Y -  Using the Toilet? Y -  In the past six months, have you accidently leaked urine? Y -  Do you have problems with loss of bowel control? Y -  Managing your Medications? Y -  Managing your Finances? Y -  Housekeeping or managing your Housekeeping? Y -  Some recent data might be hidden    Immunizations and Health Maintenance Immunization History  Administered Date(s) Administered  .  H1N1 12/17/2008  . Influenza Whole 08/27/2007, 09/30/2008, 07/31/2009, 07/31/2010  . Influenza-Unspecified 07/29/2015  . PPD Test 04/04/2017  . Pneumococcal Conjugate-13 11/22/2013  . Pneumococcal Polysaccharide-23 10/03/2007, 10/19/2011  . Td 12/15/2009  . Tdap 04/02/2017   Health Maintenance Due  Topic Date Due  . OPHTHALMOLOGY EXAM  12/06/2014  . INFLUENZA VACCINE  05/31/2017    Patient Care Team: Biagio Borg, MD as PCP - General (Internal Medicine) Pa, [place] (Specialist) Newt Minion, MD as Consulting Physician (Orthopedic Surgery) Marlou Sa Tonna Corner, MD as Consulting Physician (Orthopedic Surgery)  Indicate any recent Medical Services you may have received from other than [facility] providers in the past year (date may be approximate).     Assessment:   This is a routine wellness examination for Shari Mcmahon.   Hearing/Vision screen No exam data present  Dietary issues and exercise activities discussed: Current Exercise Habits: The patient does not participate in regular exercise at present, Exercise limited by: neurologic condition(s)  Goals    None     Depression Screen PHQ 2/9 Scores 06/08/2017 02/02/2017 07/07/2016 02/19/2016  11/22/2013 05/21/2013  PHQ - 2 Score 0 0 1 0 1 0  Exception Documentation - - Patient refusal - - -    Fall Risk Fall Risk  06/08/2017 02/02/2017 07/07/2016 02/19/2016 11/22/2013  Falls in the past year? Yes No No Yes No  Number falls in past yr: 1 - - 1 -  Comment - - - - -  Injury with Fall? Yes - - Yes -  Comment R tibia - - - -  Risk for fall due to : - - Impaired balance/gait;Impaired mobility - -    Cognitive Function:     6CIT Screen 06/08/2017  What Year? 0 points  What month? 0 points  What time? 0 points  Count back from 20 0 points  Months in reverse 0 points  Repeat phrase 4 points  Total Score 4    Screening Tests Health Maintenance  Topic Date Due  . OPHTHALMOLOGY EXAM  12/06/2014  . INFLUENZA VACCINE  05/31/2017  . HEMOGLOBIN A1C  11/08/2017  . FOOT EXAM  02/02/2018  . URINE MICROALBUMIN  02/02/2018  . TETANUS/TDAP  04/03/2027  . PNA vac Low Risk Adult  Completed  . DEXA SCAN  Excluded      Plan:    I have personally reviewed and addressed the Medicare Annual Wellness questionnaire and have noted the following in the patient's chart:  A. Medical and social history B. Use of alcohol, tobacco or illicit drugs  C. Current medications and supplements D. Functional ability and status E.  Nutritional status F.  Physical activity G. Advance directives H. List of other physicians I.  Hospitalizations, surgeries, and ER visits in previous 12 months J.  [place] to include hearing, vision, cognitive, depression L. Referrals and appointments - none  In addition, I have reviewed and discussed with patient certain preventive protocols, quality metrics, and best practice recommendations. A written personalized care plan for preventive services as well as general preventive health recommendations were provided to patient.  See attached scanned questionnaire for additional information.   Signed,   Rich Reining, RN Nurse Health Advisor   Quick  Notes   Health Maintenance: Eye exam due     Abnormal Screen: 6 CIT-4     Patient Concerns: None     Nurse Concerns: None

## 2017-06-08 NOTE — Patient Instructions (Signed)
Ms. Shari Mcmahon , Thank you for taking time to come for your Medicare Wellness Visit. I appreciate your ongoing commitment to your health goals. Please review the following plan we discussed and let me know if I can assist you in the future.   Screening recommendations/referrals: Colonoscopy excluded, pt over age 81 Mammogram excluded, pt over age 74 Bone Density up to date Recommended yearly ophthalmology/optometry visit for glaucoma screening and checkup Recommended yearly dental visit for hygiene and checkup  Vaccinations: Influenza vaccine due 2018 fall season Pneumococcal vaccine up to date Tdap vaccine due 04/03/27 Shingles vaccine not in records  Advanced directives: In Chart  Conditions/risks identified: None  Next appointment: Dr. Sheppard Coil makes rounds   Preventive Care 65 Years and Older, Female Preventive care refers to lifestyle choices and visits with your health care provider that can promote health and wellness. What does preventive care include?  A yearly physical exam. This is also called an annual well check.  Dental exams once or twice a year.  Routine eye exams. Ask your health care provider how often you should have your eyes checked.  Personal lifestyle choices, including:  Daily care of your teeth and gums.  Regular physical activity.  Eating a healthy diet.  Avoiding tobacco and drug use.  Limiting alcohol use.  Practicing safe sex.  Taking low-dose aspirin every day.  Taking vitamin and mineral supplements as recommended by your health care provider. What happens during an annual well check? The services and screenings done by your health care provider during your annual well check will depend on your age, overall health, lifestyle risk factors, and family history of disease. Counseling  Your health care provider may ask you questions about your:  Alcohol use.  Tobacco use.  Drug use.  Emotional well-being.  Home and relationship  well-being.  Sexual activity.  Eating habits.  History of falls.  Memory and ability to understand (cognition).  Work and work Statistician.  Reproductive health. Screening  You may have the following tests or measurements:  Height, weight, and BMI.  Blood pressure.  Lipid and cholesterol levels. These may be checked every 5 years, or more frequently if you are over 78 years old.  Skin check.  Lung cancer screening. You may have this screening every year starting at age 35 if you have a 30-pack-year history of smoking and currently smoke or have quit within the past 15 years.  Fecal occult blood test (FOBT) of the stool. You may have this test every year starting at age 74.  Flexible sigmoidoscopy or colonoscopy. You may have a sigmoidoscopy every 5 years or a colonoscopy every 10 years starting at age 70.  Hepatitis C blood test.  Hepatitis B blood test.  Sexually transmitted disease (STD) testing.  Diabetes screening. This is done by checking your blood sugar (glucose) after you have not eaten for a while (fasting). You may have this done every 1-3 years.  Bone density scan. This is done to screen for osteoporosis. You may have this done starting at age 65.  Mammogram. This may be done every 1-2 years. Talk to your health care provider about how often you should have regular mammograms. Talk with your health care provider about your test results, treatment options, and if necessary, the need for more tests. Vaccines  Your health care provider may recommend certain vaccines, such as:  Influenza vaccine. This is recommended every year.  Tetanus, diphtheria, and acellular pertussis (Tdap, Td) vaccine. You may need a Td booster  every 10 years.  Zoster vaccine. You may need this after age 22.  Pneumococcal 13-valent conjugate (PCV13) vaccine. One dose is recommended after age 14.  Pneumococcal polysaccharide (PPSV23) vaccine. One dose is recommended after age  75. Talk to your health care provider about which screenings and vaccines you need and how often you need them. This information is not intended to replace advice given to you by your health care provider. Make sure you discuss any questions you have with your health care provider. Document Released: 11/13/2015 Document Revised: 07/06/2016 Document Reviewed: 08/18/2015 Elsevier Interactive Patient Education  2017 [place] Prevention in the Home Falls can cause injuries. They can happen to people of all ages. There are many things you can do to make your home safe and to help prevent falls. What can I do on the outside of my home?  Regularly fix the edges of walkways and driveways and fix any cracks.  Remove anything that might make you trip as you walk through a door, such as a raised step or threshold.  Trim any bushes or trees on the path to your home.  Use bright outdoor lighting.  Clear any walking paths of anything that might make someone trip, such as rocks or tools.  Regularly check to see if handrails are loose or broken. Make sure that both sides of any steps have handrails.  Any raised decks and porches should have guardrails on the edges.  Have any leaves, snow, or ice cleared regularly.  Use sand or salt on walking paths during winter.  Clean up any spills in your garage right away. This includes oil or grease spills. What can I do in the bathroom?  Use night lights.  Install grab bars by the toilet and in the tub and shower. Do not use towel bars as grab bars.  Use non-skid mats or decals in the tub or shower.  If you need to sit down in the shower, use a plastic, non-slip stool.  Keep the floor dry. Clean up any water that spills on the floor as soon as it happens.  Remove soap buildup in the tub or shower regularly.  Attach bath mats securely with double-sided non-slip rug tape.  Do not have throw rugs and other things on the floor that can make  you trip. What can I do in the bedroom?  Use night lights.  Make sure that you have a light by your bed that is easy to reach.  Do not use any sheets or blankets that are too big for your bed. They should not hang down onto the floor.  Have a firm chair that has side arms. You can use this for support while you get dressed.  Do not have throw rugs and other things on the floor that can make you trip. What can I do in the kitchen?  Clean up any spills right away.  Avoid walking on wet floors.  Keep items that you use a lot in easy-to-reach places.  If you need to reach something above you, use a strong step stool that has a grab bar.  Keep electrical cords out of the way.  Do not use floor polish or wax that makes floors slippery. If you must use wax, use non-skid floor wax.  Do not have throw rugs and other things on the floor that can make you trip. What can I do with my stairs?  Do not leave any items on the stairs.  Make  sure that there are handrails on both sides of the stairs and use them. Fix handrails that are broken or loose. Make sure that handrails are as long as the stairways.  Check any carpeting to make sure that it is firmly attached to the stairs. Fix any carpet that is loose or worn.  Avoid having throw rugs at the top or bottom of the stairs. If you do have throw rugs, attach them to the floor with carpet tape.  Make sure that you have a light switch at the top of the stairs and the bottom of the stairs. If you do not have them, ask someone to add them for you. What else can I do to help prevent falls?  Wear shoes that:  Do not have high heels.  Have rubber bottoms.  Are comfortable and fit you well.  Are closed at the toe. Do not wear sandals.  If you use a stepladder:  Make sure that it is fully opened. Do not climb a closed stepladder.  Make sure that both sides of the stepladder are locked into place.  Ask someone to hold it for you, if  possible.  Clearly mark and make sure that you can see:  Any grab bars or handrails.  First and last steps.  Where the edge of each step is.  Use tools that help you move around (mobility aids) if they are needed. These include:  Canes.  Walkers.  Scooters.  Crutches.  Turn on the lights when you go into a dark area. Replace any light bulbs as soon as they burn out.  Set up your furniture so you have a clear path. Avoid moving your furniture around.  If any of your floors are uneven, fix them.  If there are any pets around you, be aware of where they are.  Review your medicines with your doctor. Some medicines can make you feel dizzy. This can increase your chance of falling. Ask your doctor what other things that you can do to help prevent falls. This information is not intended to replace advice given to you by your health care provider. Make sure you discuss any questions you have with your health care provider. Document Released: 08/13/2009 Document Revised: 03/24/2016 Document Reviewed: 11/21/2014 Elsevier Interactive Patient Education  2017 [employer].

## 2017-06-09 ENCOUNTER — Non-Acute Institutional Stay (SKILLED_NURSING_FACILITY): Payer: Medicare Other | Admitting: Internal Medicine

## 2017-06-09 ENCOUNTER — Encounter: Payer: Self-pay | Admitting: Internal Medicine

## 2017-06-09 DIAGNOSIS — F0280 Dementia in other diseases classified elsewhere without behavioral disturbance: Secondary | ICD-10-CM | POA: Diagnosis not present

## 2017-06-09 DIAGNOSIS — R2681 Unsteadiness on feet: Secondary | ICD-10-CM | POA: Diagnosis not present

## 2017-06-09 DIAGNOSIS — D638 Anemia in other chronic diseases classified elsewhere: Secondary | ICD-10-CM | POA: Diagnosis not present

## 2017-06-09 DIAGNOSIS — M109 Gout, unspecified: Secondary | ICD-10-CM

## 2017-06-09 DIAGNOSIS — M6281 Muscle weakness (generalized): Secondary | ICD-10-CM | POA: Diagnosis not present

## 2017-06-09 DIAGNOSIS — M1000 Idiopathic gout, unspecified site: Secondary | ICD-10-CM | POA: Diagnosis not present

## 2017-06-09 DIAGNOSIS — G308 Other Alzheimer's disease: Secondary | ICD-10-CM | POA: Diagnosis not present

## 2017-06-09 DIAGNOSIS — S82201D Unspecified fracture of shaft of right tibia, subsequent encounter for closed fracture with routine healing: Secondary | ICD-10-CM | POA: Diagnosis not present

## 2017-06-09 NOTE — Progress Notes (Signed)
Location:  [facility] Room Number: [place] of Service:  SNF [phone]) Provider: Daryll Drown. Sheppard Coil, MD Biagio Borg, MD  Patient Care Team: Biagio Borg, MD as PCP - General (Internal Medicine) Pa, [place] (Specialist) Newt Minion, MD as Consulting Physician (Orthopedic Surgery) Marlou Sa Tonna Corner, MD as Consulting Physician (Orthopedic Surgery)  Extended Emergency Contact Information Primary Emergency Contact: [facility] May Address: [place], [place] [address] Johnnette Litter of [place] Phone: [phone] Mobile Phone: [phone] Relation: Daughter Secondary Emergency Contact: Murphy,Jacqueline Address: [address]          [place], [place] Phone: [phone] Mobile Phone: [phone] Relation: Daughter    Allergies: Penicillins; Voltaren [diclofenac sodium]; Dulcolax [bisacodyl]; Duloxetine hcl; Oxycodone; and Timolol  Chief Complaint  Patient presents with  . Medical Management of Chronic Issues    routine visit    HPI: Patient is 83 y.o. female who Is being seen for routine issues of Alzheimer's disease, gout, and anemia of chronic disease.  Past Medical History:  Diagnosis Date  . ABDOMINAL PAIN, CHRONIC 04/07/2008  . Alzheimer's dementia 07/28/2015  . Anemia 02/25/2015  . Aneurysm of thoracic aorta ([place])    "found 03/2015; not big enough to repair" (07/07/2016)  . ARTHRITIS 03/03/2008  . Arthritis    "shoulders, knees, hands" (07/07/2016)  . Arthritis of knee, degenerative 10/19/2011  . Aspiration pneumonia ([place])   . Atherosclerosis of native arteries of the extremities with ulceration ([place]) 05/11/2016  . BRADYCARDIA, CHRONIC 12/17/2008  . CARDIAC MURMUR 02/01/2010  . Chronic pain syndrome 06/16/2014  . Closed right tibial fracture 04/03/2017  . Complication of anesthesia    "she retains carbon dioxide; sleeps too long" (07/07/2016)  . CONSTIPATION, CHRONIC  10/15/2010  . DEGENERATIVE JOINT DISEASE, KNEES, BILATERAL 12/07/2007  . DEPRESSION 06/17/2009  . DIABETES MELLITUS, TYPE II 09/19/2007   DIET CONTROL   . DVT (deep venous thrombosis) (HCC) BLE  . Dysphagia 12/24/2009   Qualifier: Diagnosis of  By: Varney Daily RN, Butch Penny    . DYSPHAGIA UNSPECIFIED 12/24/2009  . E. coli UTI 12/15/2009   Qualifier: Diagnosis of  By: Jenny Reichmann MD, Hunt Oris   . Fracture of right femur [facility]) 07/28/2015   S/p ORIF in 03/2015   . Gastroparesis 12/24/2009  . GENERALIZED OSTEOARTHROSIS INVOLVING HAND 10/03/2007  . GERD (gastroesophageal reflux disease)   . GERD without esophagitis 10/30/2015  . GLAUCOMA 09/19/2007  . GOUT 09/19/2007  . GOUTY ARTHROPATHY UNSPECIFIED 02/17/2009  . Hepatitis   . HYPERLIPIDEMIA 01/27/2010  . HYPERTENSION 09/19/2007  . Hypokalemia   . LUNG NODULE 03/03/2008  . Lung nodules 03/03/2008   Qualifier: Diagnosis of  By: Marland Mcalpine    . MENOPAUSAL DISORDER 12/17/2008  . Peripheral autonomic neuropathy due to diabetes mellitus ([place]) 11/15/2015  . PERIPHERAL NEUROPATHY 06/19/2008  . Peripheral neuropathy ([place]) 06/19/2008   With chronic pain   . PERSONAL HISTORY MALIGNANT NEOPLASM STOMACH 09/19/2007  . 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 Rotator cuff tear arthropathy of both shoulders 12/09/2013   Injected under ultrasound guidance December 09, 2013 Left shoulder injected under ultrasound April 07, 2014 Left shoulder injected again in June 20, 2014   . STOMACH CANCER 03/03/2008  . Syncope 07/07/2016  . Tibial fracture 04/03/2017  . Type II diabetes mellitus with peripheral circulatory disorder ([place]) 01/01/2016  . Ulcer of left heel and  midfoot with necrosis of muscle ([place]) 01/17/2016  . Urinary incontinence 02/02/2017  . UTI 12/15/2009    Past Surgical History:  Procedure Laterality Date  . BALLOON DILATION N/A 07/12/2016   Procedure: BALLOON DILATION;  Surgeon: Manus Gunning, MD;  [facility] END[facility]COPY;  Service:  Gastroenterology;  Laterality: N/A;  . BILROTH II PROCEDURE    . CATARACT EXTRACTION W/ INTRAOCULAR LENS  IMPLANT, BILATERAL    . COLONOSCOPY    . ESOPHAGOGASTRODUODENOSCOPY N/A 07/12/2016   Procedure: ESOPHAGOGASTRODUODENOSCOPY (EGD);  Surgeon: Manus Gunning, MD;  Location: [place];  Service: Gastroenterology;  Laterality: N/A;  . FRACTURE SURGERY    . HIP FRACTURE SURGERY Right 03/2015    Allergies as of 06/09/2017      Reactions   Penicillins Shortness Of Breath, Swelling   Has patient had a PCN reaction causing immediate rash, facial/tongue/throat swelling, SOB or lightheadedness with hypotension: yes Has patient had a PCN reaction causing severe rash involving mucus membranes or skin necrosis: no Has patient had a PCN reaction that required hospitalization: unknown Has patient had a PCN reaction occurring within the last 10 years: no If all of the above answers are "NO", then may proceed with Cephalosporin use.   Voltaren [diclofenac Sodium] Other (See Comments)   Stomach irritation   Dulcolax [bisacodyl]    Unknown    Duloxetine Hcl Other (See Comments)   Made patient not want to eat or drink   Oxycodone Other (See Comments)   hallucinations   Timolol    REACTION: bradycardia worse      Medication List       Accurate as of 06/09/17 11:59 PM. Always use your most recent med list.          acetaminophen 325 MG tablet Commonly known as:  TYLENOL Take 2 tablets (650 mg total) by mouth 3 (three) times daily.   amLODipine 5 MG tablet Commonly known as:  NORVASC TAKE 1 TABLET (5 MG TOTAL) BY MOUTH DAILY.   colchicine 0.6 MG tablet TAKE 1 TABLET (0.6 MG TOTAL) BY MOUTH DAILY AS NEEDED (GOUT PAIN.).   docusate sodium 100 MG capsule Commonly known as:  COLACE Take 1 capsule (100 mg total) by mouth 2 (two) times daily as needed for mild constipation.   donepezil 10 MG tablet Commonly known as:  ARICEPT TAKE 1 TABLET (10 MG TOTAL) BY MOUTH AT BEDTIME.     HYDROcodone-acetaminophen 5-325 MG tablet Commonly known as:  NORCO/VICODIN Take by mouth. 1 tablet for moderate pain, 2 tablets for severe pain   lactose free nutrition Liqd Take 237 mLs by mouth 3 (three) times daily between meals.   latanoprost 0.005 % ophthalmic solution Commonly known as:  XALATAN PLACE 1 DROP INTO BOTH EYES AT BEDTIME.   multivitamin with minerals tablet Take 1 tablet by mouth daily.   NAMENDA XR 28 MG Cp24 24 hr capsule Generic drug:  memantine TAKE 1 CAPSULE (28 MG TOTAL) BY MOUTH DAILY.   omeprazole 40 MG capsule Commonly known as:  PRILOSEC TAKE 1 CAPSULE (40 MG TOTAL) BY MOUTH DAILY.   ULORIC 40 MG tablet Generic drug:  febuxostat TAKE 1 TABLET (40 MG TOTAL) BY MOUTH DAILY.       No orders of the defined types were placed in this encounter.   Immunization History  Administered Date(s) Administered  . H1N1 12/17/2008  . Influenza Whole 08/27/2007, 09/30/2008, 07/31/2009, 07/31/2010  . Influenza-Unspecified 07/29/2015  . PPD Test 04/04/2017  . Pneumococcal Conjugate-13 11/22/2013  .  Pneumococcal Polysaccharide-23 10/03/2007, 10/19/2011  . Td 12/15/2009  . Tdap 04/02/2017    Social History  Substance Use Topics  . Smoking status: Former Smoker    Packs/day: 0.50    Years: 25.00    Types: Cigarettes    Quit date: 11/01/1983  . Smokeless tobacco: Never Used     Comment: "stopped when she was 83 years old"  . Alcohol use No     Comment: 07/07/2016 "nothing in years; stopped maybe in the  1980s"    Review of Systems  DATA OBTAINED: from patient, nurse GENERAL:  no fevers, fatigue, appetite changes SKIN: No itching, rash HEENT: No complaint RESPIRATORY: No cough, wheezing, SOB CARDIAC: No chest pain, palpitations, lower extremity edema  GI: No abdominal pain, No N/V/D or constipation, No heartburn or reflux  GU: No dysuria, frequency or urgency, or incontinence  MUSCULOSKELETAL: No unrelieved bone/joint pain NEUROLOGIC: No  headache, dizziness  PSYCHIATRIC: No overt anxiety or sadness  Vitals:   06/09/17 1314  BP: (!) 97/50  Pulse: 67  Resp: 18  Temp: (!) 97 F (36.1 C)   Body mass index is 21.11 kg/m. Physical Exam  GENERAL APPEARANCE: Alert,  No acute distress  SKIN: No diaphoresis rash HEENT: Unremarkable RESPIRATORY: Breathing is even, unlabored. Lung sounds are clear   CARDIOVASCULAR: Heart RRR no murmurs, rubs or gallops. No peripheral edema  GASTROINTESTINAL: Abdomen is soft, non-tender, not distended w/ normal bowel sounds.  GENITOURINARY: Bladder non tender, not distended  MUSCULOSKELETAL: No abnormal joints or musculature NEUROLOGIC: Cranial nerves 2-12 grossly intact. Moves all extremities PSYCHIATRIC: Mood and affect with dementia, no behavioral issues  Physical exam has not changed from prior exam.  Patient Active Problem List   Diagnosis Date Noted  . Glaucoma, bilateral 05/21/2017  . Hypokalemia   . Fall 04/03/2017  . Closed right tibial fracture 04/03/2017  . Tibial fracture 04/03/2017  . Acute cystitis without hematuria   . Encounter for well adult exam with abnormal findings 02/02/2017  . Urinary incontinence 02/02/2017  . Seizure-like activity ([place])   . Syncope 07/07/2016  . Atherosclerosis of native arteries of the extremities with ulceration ([place]) 05/11/2016    Class: Chronic  . Weakness generalized   . Hypernatremia   . Aspiration pneumonia ([place])   . AKI (acute kidney injury) ([place])   . Ulcer of left heel and midfoot with necrosis of muscle ([place]) 01/17/2016  . Type II diabetes mellitus with peripheral circulatory disorder ([place]) 01/01/2016  . Pressure ulcer 01/01/2016  . Peripheral autonomic neuropathy due to diabetes mellitus ([place]) 11/15/2015  . GERD without esophagitis 10/30/2015  . Alzheimer's dementia 07/28/2015  . Fracture of right femur ([place]) 07/28/2015  . Arthritis of knee 05/19/2015  . Anemia 02/25/2015  . Loss of weight 09/17/2014  . Chronic pain  syndrome 06/16/2014  . Primary localized osteoarthrosis, lower leg 01/08/2014  . Rotator cuff tear arthropathy of both shoulders 12/09/2013  . CONSTIPATION, CHRONIC 10/15/2010  . Hyperlipidemia 01/27/2010  . Gastroparesis 12/24/2009  . Dysphagia 12/24/2009  . E. coli UTI 12/15/2009  . DEPRESSION 06/17/2009  . Gouty arthropathy 02/17/2009  . BRADYCARDIA, CHRONIC 12/17/2008  . Peripheral neuropathy ([place]) 06/19/2008  . Lung nodules 03/03/2008  . GENERALIZED OSTEOARTHROSIS INVOLVING HAND 10/03/2007  . Gout 09/19/2007  . GLAUCOMA 09/19/2007  . Essential hypertension 09/19/2007    CMP     Component Value Date/Time   NA 140 04/10/2017   K 4.7 04/10/2017   CL 108 04/04/2017 0438   CO2 26 04/04/2017 0438  GLUCOSE 82 04/04/2017 0438   BUN 25 (A) 04/10/2017   CREATININE 1.2 (A) 04/10/2017   CREATININE 1.11 (H) 04/04/2017 0438   CALCIUM 8.1 (L) 04/04/2017 0438   PROT 7.4 02/02/2017 1449   ALBUMIN 3.6 02/02/2017 1449   AST 22 02/02/2017 1449   ALT 13 02/02/2017 1449   ALKPHOS 94 02/02/2017 1449   BILITOT 0.3 02/02/2017 1449   GFRNONAA 43 (L) 04/04/2017 0438   GFRAA 50 (L) 04/04/2017 0438    Recent Labs  07/07/16 1431  02/02/17 1449  04/03/17 1959 04/04/17 0438 04/10/17  NA  --   < > 141  < > 145  145 140  140 140  K  --   < > 3.9  --  3.5  3.5 3.3* 4.7  CL  --   < > 107  --  110 108  --   CO2  --   < > 29  --  28 26  --   GLUCOSE  --   < > 74  --  88 82  --   BUN  --   < > 17  < > 21  21* 20  20 25*  CREATININE  --   < > 0.97  < > 1.14*  1.1 1.11*  1.1 1.2*  CALCIUM  --   < > 9.2  --  8.6* 8.1*  --   MG 1.6*  --   --   --   --   --   --   < > = values in this interval not displayed.  Recent Labs  07/07/16 1431 07/08/16 0203 02/02/17 1449  AST 22 19 22   ALT 11* 9* 13  ALKPHOS 93 73 94  BILITOT 0.4 0.5 0.3  PROT 6.3* 4.9* 7.4  ALBUMIN 3.0* 2.3* 3.6    Recent Labs  07/07/16 1027  02/02/17 1449  04/03/17 1959 04/04/17 0054 04/04/17 0438 04/10/17   WBC 7.1  < > 6.7  < > 7.8  7.8 9.1  9.1 7.5  7.5 7.5  NEUTROABS 5.1  --  4.1  --  5.3  --   --   --   HGB 12.2  < > 11.3*  --  8.6*  8.6* 8.9*  8.9* 7.9* 8.3*  HCT 36.7  < > 34.3*  --  25.3*  25* 26.6*  27* 24.2* 27*  MCV 100.0  < > 103.2*  --  97.3 97.8 98.0  --   PLT 148*  < > 134.0*  --  100*  100* 106*  106* 88* 182  < > = values in this interval not displayed.  Recent Labs  02/02/17 1449  CHOL 180  LDLCALC 89  TRIG 90.0   Lab Results  Component Value Date   MICROALBUR 27.2 (H) 02/02/2017   Lab Results  Component Value Date   TSH 1.07 02/02/2017   Lab Results  Component Value Date   HGBA1C 4.1 05/08/2017   Lab Results  Component Value Date   CHOL 180 02/02/2017   HDL 72.70 02/02/2017   LDLCALC 89 02/02/2017   LDLDIRECT 124.3 04/09/2010   TRIG 90.0 02/02/2017   CHOLHDL 2 02/02/2017    Significant Diagnostic Results in last 30 days:  Xr Tibia/fibula Right  Result Date: 05/26/2017 AP lateral right tib-fib reviewed.  Healing of the proximal tibia fracture has occurred.  Significant tricompartment arthritis is present.  Calcification is noted around the proximal tibial fracture site.   Assessment and Plan  Alzheimer's dementia Chronic  and stable without major declines; plan to continue Aricept 10 mg by mouth daily and Namenda X are 28 mg by mouth daily  Gouty arthropathy Chronic and stable without reported flares; plan to continue Uniretic 40 mg daily and colchicine 0.6 mg when necessary acute flare  Anemia Recent hemoglobin is 8.3 which is improved from prior; we'll monitor at intervals    Webb Silversmith D. Sheppard Coil, MD

## 2017-06-12 DIAGNOSIS — M1000 Idiopathic gout, unspecified site: Secondary | ICD-10-CM | POA: Diagnosis not present

## 2017-06-12 DIAGNOSIS — M6281 Muscle weakness (generalized): Secondary | ICD-10-CM | POA: Diagnosis not present

## 2017-06-12 DIAGNOSIS — R2681 Unsteadiness on feet: Secondary | ICD-10-CM | POA: Diagnosis not present

## 2017-06-12 DIAGNOSIS — S82201D Unspecified fracture of shaft of right tibia, subsequent encounter for closed fracture with routine healing: Secondary | ICD-10-CM | POA: Diagnosis not present

## 2017-06-13 DIAGNOSIS — M1000 Idiopathic gout, unspecified site: Secondary | ICD-10-CM | POA: Diagnosis not present

## 2017-06-13 DIAGNOSIS — S82201D Unspecified fracture of shaft of right tibia, subsequent encounter for closed fracture with routine healing: Secondary | ICD-10-CM | POA: Diagnosis not present

## 2017-06-13 DIAGNOSIS — R2681 Unsteadiness on feet: Secondary | ICD-10-CM | POA: Diagnosis not present

## 2017-06-13 DIAGNOSIS — M6281 Muscle weakness (generalized): Secondary | ICD-10-CM | POA: Diagnosis not present

## 2017-06-14 DIAGNOSIS — S82201D Unspecified fracture of shaft of right tibia, subsequent encounter for closed fracture with routine healing: Secondary | ICD-10-CM | POA: Diagnosis not present

## 2017-06-14 DIAGNOSIS — M6281 Muscle weakness (generalized): Secondary | ICD-10-CM | POA: Diagnosis not present

## 2017-06-14 DIAGNOSIS — M1000 Idiopathic gout, unspecified site: Secondary | ICD-10-CM | POA: Diagnosis not present

## 2017-06-14 DIAGNOSIS — R2681 Unsteadiness on feet: Secondary | ICD-10-CM | POA: Diagnosis not present

## 2017-06-15 DIAGNOSIS — M6281 Muscle weakness (generalized): Secondary | ICD-10-CM | POA: Diagnosis not present

## 2017-06-15 DIAGNOSIS — S82201D Unspecified fracture of shaft of right tibia, subsequent encounter for closed fracture with routine healing: Secondary | ICD-10-CM | POA: Diagnosis not present

## 2017-06-15 DIAGNOSIS — M1000 Idiopathic gout, unspecified site: Secondary | ICD-10-CM | POA: Diagnosis not present

## 2017-06-15 DIAGNOSIS — R2681 Unsteadiness on feet: Secondary | ICD-10-CM | POA: Diagnosis not present

## 2017-06-16 DIAGNOSIS — R2681 Unsteadiness on feet: Secondary | ICD-10-CM | POA: Diagnosis not present

## 2017-06-16 DIAGNOSIS — S82201D Unspecified fracture of shaft of right tibia, subsequent encounter for closed fracture with routine healing: Secondary | ICD-10-CM | POA: Diagnosis not present

## 2017-06-16 DIAGNOSIS — M6281 Muscle weakness (generalized): Secondary | ICD-10-CM | POA: Diagnosis not present

## 2017-06-16 DIAGNOSIS — M1000 Idiopathic gout, unspecified site: Secondary | ICD-10-CM | POA: Diagnosis not present

## 2017-06-17 DIAGNOSIS — M6281 Muscle weakness (generalized): Secondary | ICD-10-CM | POA: Diagnosis not present

## 2017-06-17 DIAGNOSIS — S82201D Unspecified fracture of shaft of right tibia, subsequent encounter for closed fracture with routine healing: Secondary | ICD-10-CM | POA: Diagnosis not present

## 2017-06-17 DIAGNOSIS — M1000 Idiopathic gout, unspecified site: Secondary | ICD-10-CM | POA: Diagnosis not present

## 2017-06-17 DIAGNOSIS — R2681 Unsteadiness on feet: Secondary | ICD-10-CM | POA: Diagnosis not present

## 2017-06-19 DIAGNOSIS — S82201D Unspecified fracture of shaft of right tibia, subsequent encounter for closed fracture with routine healing: Secondary | ICD-10-CM | POA: Diagnosis not present

## 2017-06-19 DIAGNOSIS — M1000 Idiopathic gout, unspecified site: Secondary | ICD-10-CM | POA: Diagnosis not present

## 2017-06-19 DIAGNOSIS — R2681 Unsteadiness on feet: Secondary | ICD-10-CM | POA: Diagnosis not present

## 2017-06-19 DIAGNOSIS — M6281 Muscle weakness (generalized): Secondary | ICD-10-CM | POA: Diagnosis not present

## 2017-06-20 DIAGNOSIS — M6281 Muscle weakness (generalized): Secondary | ICD-10-CM | POA: Diagnosis not present

## 2017-06-20 DIAGNOSIS — R2681 Unsteadiness on feet: Secondary | ICD-10-CM | POA: Diagnosis not present

## 2017-06-20 DIAGNOSIS — M1000 Idiopathic gout, unspecified site: Secondary | ICD-10-CM | POA: Diagnosis not present

## 2017-06-20 DIAGNOSIS — S82201D Unspecified fracture of shaft of right tibia, subsequent encounter for closed fracture with routine healing: Secondary | ICD-10-CM | POA: Diagnosis not present

## 2017-06-21 DIAGNOSIS — M6281 Muscle weakness (generalized): Secondary | ICD-10-CM | POA: Diagnosis not present

## 2017-06-21 DIAGNOSIS — M1000 Idiopathic gout, unspecified site: Secondary | ICD-10-CM | POA: Diagnosis not present

## 2017-06-21 DIAGNOSIS — R2681 Unsteadiness on feet: Secondary | ICD-10-CM | POA: Diagnosis not present

## 2017-06-21 DIAGNOSIS — S82201D Unspecified fracture of shaft of right tibia, subsequent encounter for closed fracture with routine healing: Secondary | ICD-10-CM | POA: Diagnosis not present

## 2017-06-22 DIAGNOSIS — M1000 Idiopathic gout, unspecified site: Secondary | ICD-10-CM | POA: Diagnosis not present

## 2017-06-22 DIAGNOSIS — S82201D Unspecified fracture of shaft of right tibia, subsequent encounter for closed fracture with routine healing: Secondary | ICD-10-CM | POA: Diagnosis not present

## 2017-06-22 DIAGNOSIS — M6281 Muscle weakness (generalized): Secondary | ICD-10-CM | POA: Diagnosis not present

## 2017-06-22 DIAGNOSIS — R2681 Unsteadiness on feet: Secondary | ICD-10-CM | POA: Diagnosis not present

## 2017-06-23 ENCOUNTER — Ambulatory Visit (INDEPENDENT_AMBULATORY_CARE_PROVIDER_SITE_OTHER): Payer: Medicare Other | Admitting: Orthopedic Surgery

## 2017-06-23 DIAGNOSIS — M1000 Idiopathic gout, unspecified site: Secondary | ICD-10-CM | POA: Diagnosis not present

## 2017-06-23 DIAGNOSIS — S82201D Unspecified fracture of shaft of right tibia, subsequent encounter for closed fracture with routine healing: Secondary | ICD-10-CM | POA: Diagnosis not present

## 2017-06-23 DIAGNOSIS — M6281 Muscle weakness (generalized): Secondary | ICD-10-CM | POA: Diagnosis not present

## 2017-06-23 DIAGNOSIS — R2681 Unsteadiness on feet: Secondary | ICD-10-CM | POA: Diagnosis not present

## 2017-06-24 ENCOUNTER — Encounter: Payer: Self-pay | Admitting: Internal Medicine

## 2017-06-24 DIAGNOSIS — M1000 Idiopathic gout, unspecified site: Secondary | ICD-10-CM | POA: Diagnosis not present

## 2017-06-24 DIAGNOSIS — S82201D Unspecified fracture of shaft of right tibia, subsequent encounter for closed fracture with routine healing: Secondary | ICD-10-CM | POA: Diagnosis not present

## 2017-06-24 DIAGNOSIS — M6281 Muscle weakness (generalized): Secondary | ICD-10-CM | POA: Diagnosis not present

## 2017-06-24 DIAGNOSIS — R2681 Unsteadiness on feet: Secondary | ICD-10-CM | POA: Diagnosis not present

## 2017-06-24 NOTE — Assessment & Plan Note (Signed)
Chronic and stable without reported flares; plan to continue Uniretic 40 mg daily and colchicine 0.6 mg when necessary acute flare

## 2017-06-24 NOTE — Assessment & Plan Note (Signed)
Chronic and stable without major declines; plan to continue Aricept 10 mg by mouth daily and Namenda X are 28 mg by mouth daily

## 2017-06-24 NOTE — Assessment & Plan Note (Signed)
Recent hemoglobin is 8.3 which is improved from prior; we'll monitor at intervals

## 2017-06-26 DIAGNOSIS — S82201D Unspecified fracture of shaft of right tibia, subsequent encounter for closed fracture with routine healing: Secondary | ICD-10-CM | POA: Diagnosis not present

## 2017-06-26 DIAGNOSIS — M1000 Idiopathic gout, unspecified site: Secondary | ICD-10-CM | POA: Diagnosis not present

## 2017-06-26 DIAGNOSIS — M6281 Muscle weakness (generalized): Secondary | ICD-10-CM | POA: Diagnosis not present

## 2017-06-26 DIAGNOSIS — R2681 Unsteadiness on feet: Secondary | ICD-10-CM | POA: Diagnosis not present

## 2017-06-27 DIAGNOSIS — S82201D Unspecified fracture of shaft of right tibia, subsequent encounter for closed fracture with routine healing: Secondary | ICD-10-CM | POA: Diagnosis not present

## 2017-06-27 DIAGNOSIS — M6281 Muscle weakness (generalized): Secondary | ICD-10-CM | POA: Diagnosis not present

## 2017-06-27 DIAGNOSIS — R2681 Unsteadiness on feet: Secondary | ICD-10-CM | POA: Diagnosis not present

## 2017-06-27 DIAGNOSIS — M1000 Idiopathic gout, unspecified site: Secondary | ICD-10-CM | POA: Diagnosis not present

## 2017-06-28 DIAGNOSIS — R2681 Unsteadiness on feet: Secondary | ICD-10-CM | POA: Diagnosis not present

## 2017-06-28 DIAGNOSIS — M6281 Muscle weakness (generalized): Secondary | ICD-10-CM | POA: Diagnosis not present

## 2017-06-28 DIAGNOSIS — M1000 Idiopathic gout, unspecified site: Secondary | ICD-10-CM | POA: Diagnosis not present

## 2017-06-28 DIAGNOSIS — S82201D Unspecified fracture of shaft of right tibia, subsequent encounter for closed fracture with routine healing: Secondary | ICD-10-CM | POA: Diagnosis not present

## 2017-06-29 DIAGNOSIS — R2681 Unsteadiness on feet: Secondary | ICD-10-CM | POA: Diagnosis not present

## 2017-06-29 DIAGNOSIS — S82201D Unspecified fracture of shaft of right tibia, subsequent encounter for closed fracture with routine healing: Secondary | ICD-10-CM | POA: Diagnosis not present

## 2017-06-29 DIAGNOSIS — M1000 Idiopathic gout, unspecified site: Secondary | ICD-10-CM | POA: Diagnosis not present

## 2017-06-29 DIAGNOSIS — M6281 Muscle weakness (generalized): Secondary | ICD-10-CM | POA: Diagnosis not present

## 2017-06-30 DIAGNOSIS — M1000 Idiopathic gout, unspecified site: Secondary | ICD-10-CM | POA: Diagnosis not present

## 2017-06-30 DIAGNOSIS — M6281 Muscle weakness (generalized): Secondary | ICD-10-CM | POA: Diagnosis not present

## 2017-06-30 DIAGNOSIS — R2681 Unsteadiness on feet: Secondary | ICD-10-CM | POA: Diagnosis not present

## 2017-06-30 DIAGNOSIS — S82201D Unspecified fracture of shaft of right tibia, subsequent encounter for closed fracture with routine healing: Secondary | ICD-10-CM | POA: Diagnosis not present

## 2017-07-01 DIAGNOSIS — R2681 Unsteadiness on feet: Secondary | ICD-10-CM | POA: Diagnosis not present

## 2017-07-01 DIAGNOSIS — S82201D Unspecified fracture of shaft of right tibia, subsequent encounter for closed fracture with routine healing: Secondary | ICD-10-CM | POA: Diagnosis not present

## 2017-07-01 DIAGNOSIS — M6281 Muscle weakness (generalized): Secondary | ICD-10-CM | POA: Diagnosis not present

## 2017-07-02 DIAGNOSIS — R2681 Unsteadiness on feet: Secondary | ICD-10-CM | POA: Diagnosis not present

## 2017-07-02 DIAGNOSIS — M6281 Muscle weakness (generalized): Secondary | ICD-10-CM | POA: Diagnosis not present

## 2017-07-02 DIAGNOSIS — S82201D Unspecified fracture of shaft of right tibia, subsequent encounter for closed fracture with routine healing: Secondary | ICD-10-CM | POA: Diagnosis not present

## 2017-07-03 DIAGNOSIS — R2681 Unsteadiness on feet: Secondary | ICD-10-CM | POA: Diagnosis not present

## 2017-07-03 DIAGNOSIS — M6281 Muscle weakness (generalized): Secondary | ICD-10-CM | POA: Diagnosis not present

## 2017-07-03 DIAGNOSIS — S82201D Unspecified fracture of shaft of right tibia, subsequent encounter for closed fracture with routine healing: Secondary | ICD-10-CM | POA: Diagnosis not present

## 2017-07-04 DIAGNOSIS — M6281 Muscle weakness (generalized): Secondary | ICD-10-CM | POA: Diagnosis not present

## 2017-07-04 DIAGNOSIS — R2681 Unsteadiness on feet: Secondary | ICD-10-CM | POA: Diagnosis not present

## 2017-07-04 DIAGNOSIS — S82201D Unspecified fracture of shaft of right tibia, subsequent encounter for closed fracture with routine healing: Secondary | ICD-10-CM | POA: Diagnosis not present

## 2017-07-05 ENCOUNTER — Ambulatory Visit (INDEPENDENT_AMBULATORY_CARE_PROVIDER_SITE_OTHER): Payer: Medicare Other

## 2017-07-05 ENCOUNTER — Ambulatory Visit (INDEPENDENT_AMBULATORY_CARE_PROVIDER_SITE_OTHER): Payer: Medicare Other | Admitting: Orthopedic Surgery

## 2017-07-05 ENCOUNTER — Encounter (INDEPENDENT_AMBULATORY_CARE_PROVIDER_SITE_OTHER): Payer: Self-pay | Admitting: Orthopedic Surgery

## 2017-07-05 DIAGNOSIS — S82101D Unspecified fracture of upper end of right tibia, subsequent encounter for closed fracture with routine healing: Secondary | ICD-10-CM

## 2017-07-05 NOTE — Progress Notes (Signed)
Post-Op Visit Note   Patient: Shari Mcmahon           Date of Birth: 10-13-1927           MRN: [id number] Visit Date: 07/05/2017 PCP: Biagio Borg, MD   Assessment & Plan:  Chief Complaint:  Chief Complaint  Patient presents with  . Follow-up    follow up tib/fib fx   Visit Diagnoses:  1. Closed fracture of proximal end of right tibia with routine healing, unspecified fracture morphology, subsequent encounter     Plan: Kersten is an 83 year old patient with right tib-fib fracture.  She's been doing well.  She has endstage arthritis in that knee.  She also has neuropathy in both legs.  Compartments are soft on examination today of that right leg.  Foot is perfused.  Range of motion of the knee is predictively limited but not changed from prior visit.  Extensor mechanism is intact.  Plan is weightbearing as tolerated on that right leg.  I'll see her back as needed.  Continue physical therapy for range of motion strengthening and transition to home exercise program  Follow-Up Instructions: Return if symptoms worsen or fail to improve.   Orders:  Orders Placed This Encounter  Procedures  . XR Tibia/Fibula Right   No orders of the defined types were placed in this encounter.   Imaging: Xr Tibia/fibula Right  Result Date: 07/05/2017 AP lateral right tib-fib reviewed.  Calcium formation and fracture healing of the transverse tibial plateau fracture is noted.  Severe end-stage arthritis present in all 3 compartments of the knee.  No new fractures identified alignment intact   PMFS History: Patient Active Problem List   Diagnosis Date Noted  . Glaucoma, bilateral 05/21/2017  . Hypokalemia   . Fall 04/03/2017  . Closed right tibial fracture 04/03/2017  . Tibial fracture 04/03/2017  . Acute cystitis without hematuria   . Encounter for well adult exam with abnormal findings 02/02/2017  . Urinary incontinence 02/02/2017  . Seizure-like activity ([place])   . Syncope 07/07/2016   . Atherosclerosis of native arteries of the extremities with ulceration ([place]) 05/11/2016    Class: Chronic  . Weakness generalized   . Hypernatremia   . Aspiration pneumonia ([place])   . AKI (acute kidney injury) ([place])   . Ulcer of left heel and midfoot with necrosis of muscle ([place]) 01/17/2016  . Type II diabetes mellitus with peripheral circulatory disorder ([employer]) 01/01/2016  . Pressure ulcer 01/01/2016  . Peripheral autonomic neuropathy due to diabetes mellitus ([place]) 11/15/2015  . GERD without esophagitis 10/30/2015  . Alzheimer's dementia 07/28/2015  . Fracture of right femur ([place]) 07/28/2015  . Arthritis of knee 05/19/2015  . Anemia 02/25/2015  . Loss of weight 09/17/2014  . Chronic pain syndrome 06/16/2014  . Primary localized osteoarthrosis, lower leg 01/08/2014  . Rotator cuff tear arthropathy of both shoulders 12/09/2013  . CONSTIPATION, CHRONIC 10/15/2010  . Hyperlipidemia 01/27/2010  . Gastroparesis 12/24/2009  . Dysphagia 12/24/2009  . E. coli UTI 12/15/2009  . DEPRESSION 06/17/2009  . Gouty arthropathy 02/17/2009  . BRADYCARDIA, CHRONIC 12/17/2008  . Peripheral neuropathy ([place]) 06/19/2008  . Lung nodules 03/03/2008  . GENERALIZED OSTEOARTHROSIS INVOLVING HAND 10/03/2007  . Gout 09/19/2007  . GLAUCOMA 09/19/2007  . Essential hypertension 09/19/2007   Past Medical History:  Diagnosis Date  . ABDOMINAL PAIN, CHRONIC 04/07/2008  . Alzheimer's dementia 07/28/2015  . Anemia 02/25/2015  . Aneurysm of thoracic aorta ([place])    "found 03/2015; not big  enough to repair" (07/07/2016)  . ARTHRITIS 03/03/2008  . Arthritis    "shoulders, knees, hands" (07/07/2016)  . Arthritis of knee, degenerative 10/19/2011  . Aspiration pneumonia ([place])   . Atherosclerosis of native arteries of the extremities with ulceration ([place]) 05/11/2016  . BRADYCARDIA, CHRONIC 12/17/2008  . CARDIAC MURMUR 02/01/2010  . Chronic pain syndrome 06/16/2014  . Closed right tibial fracture 04/03/2017  . Complication of  anesthesia    "she retains carbon dioxide; sleeps too long" (07/07/2016)  . CONSTIPATION, CHRONIC 10/15/2010  . DEGENERATIVE JOINT DISEASE, KNEES, BILATERAL 12/07/2007  . DEPRESSION 06/17/2009  . DIABETES MELLITUS, TYPE II 09/19/2007   DIET CONTROL   . DVT (deep venous thrombosis) (HCC) BLE  . Dysphagia 12/24/2009   Qualifier: Diagnosis of  By: Varney Daily RN, Butch Penny    . DYSPHAGIA UNSPECIFIED 12/24/2009  . E. coli UTI 12/15/2009   Qualifier: Diagnosis of  By: Jenny Reichmann MD, Hunt Oris   . Fracture of right femur [facility]) 07/28/2015   S/p ORIF in 03/2015   . Gastroparesis 12/24/2009  . GENERALIZED OSTEOARTHROSIS INVOLVING HAND 10/03/2007  . GERD (gastroesophageal reflux disease)   . GERD without esophagitis 10/30/2015  . GLAUCOMA 09/19/2007  . GOUT 09/19/2007  . GOUTY ARTHROPATHY UNSPECIFIED 02/17/2009  . Hepatitis   . HYPERLIPIDEMIA 01/27/2010  . HYPERTENSION 09/19/2007  . Hypokalemia   . LUNG NODULE 03/03/2008  . Lung nodules 03/03/2008   Qualifier: Diagnosis of  By: Marland Mcalpine    . MENOPAUSAL DISORDER 12/17/2008  . 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 PERIPHERAL NEUROPATHY 06/19/2008  . Peripheral neuropathy ([place]) 06/19/2008   With chronic pain   . PERSONAL HISTORY MALIGNANT NEOPLASM STOMACH 09/19/2007  . 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 Rotator cuff tear arthropathy of both shoulders 12/09/2013   Injected under ultrasound guidance December 09, 2013 Left shoulder injected under ultrasound April 07, 2014 Left shoulder injected again in June 20, 2014   . STOMACH CANCER 03/03/2008  . Syncope 07/07/2016  . Tibial fracture 04/03/2017  . Type II diabetes mellitus with peripheral circulatory disorder ([place]) 01/01/2016  . Ulcer of left heel and midfoot with necrosis of muscle ([place]) 01/17/2016  . Urinary incontinence 02/02/2017  . UTI 12/15/2009    Family History  Problem Relation Age of Onset  . Uterine cancer Mother   . Stroke Father         Hemorrhagic  . Cancer Sister        breast cancer and one sister with uterine cancer and one sister with ovarian cancerr  . Colon cancer Maternal Grandmother   . Colon cancer Paternal Grandfather   . Diabetes Other   . Prostate cancer Brother   . Stomach cancer Neg Hx     Past Surgical History:  Procedure Laterality Date  . BALLOON DILATION N/A 07/12/2016   Procedure: BALLOON DILATION;  Surgeon: Manus Gunning, MD;  Location: [facility] ENDOSCOPY;  Service: Gastroenterology;  Laterality: N/A;  . BILROTH II PROCEDURE    . CATARACT EXTRACTION W/ INTRAOCULAR LENS  IMPLANT, BILATERAL    . COLONOSCOPY    . ESOPHAGOGASTRODUODENOSCOPY N/A 07/12/2016   Procedure: ESOPHAGOGASTRODUODENOSCOPY (EGD);  Surgeon: Manus Gunning, MD;  Location: [place];  Service: Gastroenterology;  Laterality: N/A;  . FRACTURE SURGERY    . HIP FRACTURE SURGERY Right 03/2015   Social History   Occupational History  . retired Quarry manager Retired   Social History Main Topics  . Smoking status: Former Smoker  Packs/day: 0.50    Years: 25.00    Types: Cigarettes    Quit date: 11/01/1983  . Smokeless tobacco: Never Used     Comment: "stopped when she was 83 years old"  . Alcohol use No     Comment: 07/07/2016 "nothing in years; stopped maybe in the  1980s"  . Drug use: No  . Sexual activity: No

## 2017-07-07 ENCOUNTER — Encounter: Payer: Self-pay | Admitting: Internal Medicine

## 2017-07-07 ENCOUNTER — Non-Acute Institutional Stay (SKILLED_NURSING_FACILITY): Payer: Medicare Other | Admitting: Internal Medicine

## 2017-07-07 DIAGNOSIS — N3281 Overactive bladder: Secondary | ICD-10-CM | POA: Diagnosis not present

## 2017-07-07 DIAGNOSIS — E1142 Type 2 diabetes mellitus with diabetic polyneuropathy: Secondary | ICD-10-CM

## 2017-07-07 NOTE — Progress Notes (Signed)
Location:  [facility] Room Number: [phone] Place of Service:  SNF [phone])  Provider: Noah Delaine. Sheppard Coil, MD   Biagio Borg, MD  Patient Care Team: Biagio Borg, MD as PCP - General (Internal Medicine) Pa, Vina (Specialist) Newt Minion, MD as Consulting Physician (Orthopedic Surgery) Marlou Sa Tonna Corner, MD as Consulting Physician (Orthopedic Surgery)  Extended Emergency Contact Information Primary Emergency Contact: [facility] Address: [place], [place] [address] [address]itter of [place] Phone: [phone] Mobile Phone: [phone] Relation: Daughter Secondary Emergency Contact: Murphy,Jacqueline Address: [address]          [place], [place] Phone: [phone] Mobile Phone: [phone] Relation: Daughter    Allergies: Penicillins; Voltaren [diclofenac sodium]; Dulcolax [bisacodyl]; Duloxetine hcl; Oxycodone; and Timolol  Chief Complaint  Patient presents with  . Medical Management of Chronic Issues    routine visit    HPI: Patient is 83 y.o. female who Nursing asked me to see for right leg pain that has been going on for a while but the patient just says something to nursing today. Patient is the pain is shooting and that there some numbness to her right leg lying down or sitting does not make it worse walking standing does not make it worse or better. Also while I was there patient complained of having to get up to go to the bathroom at night and frequency. Patient has no fever, nausea, vomiting, or any other systemic symptom.  Past Medical History:  Diagnosis Date  . ABDOMINAL PAIN, CHRONIC 04/07/2008  . Alzheimer's dementia 07/28/2015  . Anemia 02/25/2015  . Aneurysm of thoracic aorta ([place])    "found 03/2015; not big enough to repair" (07/07/2016)  . ARTHRITIS 03/03/2008  . Arthritis    "shoulders, knees, hands" (07/07/2016)  . Arthritis of knee, degenerative  10/19/2011  . Aspiration pneumonia ([place])   . Atherosclerosis of native arteries of the extremities with ulceration ([place]) 05/11/2016  . BRADYCARDIA, CHRONIC 12/17/2008  . CARDIAC MURMUR 02/01/2010  . Chronic pain syndrome 06/16/2014  . Closed right tibial fracture 04/03/2017  . Complication of anesthesia    "she retains carbon dioxide; sleeps too long" (07/07/2016)  . CONSTIPATION, CHRONIC 10/15/2010  . DEGENERATIVE JOINT DISEASE, KNEES, BILATERAL 12/07/2007  . DEPRESSION 06/17/2009  . DIABETES MELLITUS, TYPE II 09/19/2007   DIET CONTROL   . DVT (deep venous thrombosis) (HCC) BLE  . Dysphagia 12/24/2009   Qualifier: Diagnosis of  By: Varney Daily RN, Butch Penny    . DYSPHAGIA UNSPECIFIED 12/24/2009  . E. coli UTI 12/15/2009   Qualifier: Diagnosis of  By: Jenny Reichmann MD, Hunt Oris   . Fracture of right femur [facility]) 07/28/2015   S/p ORIF in 03/2015   . Gastroparesis 12/24/2009  . GENERALIZED OSTEOARTHROSIS INVOLVING HAND 10/03/2007  . GERD (gastroesophageal reflux disease)   . GERD without esophagitis 10/30/2015  . GLAUCOMA 09/19/2007  . GOUT 09/19/2007  . GOUTY ARTHROPATHY UNSPECIFIED 02/17/2009  . Hepatitis   . HYPERLIPIDEMIA 01/27/2010  . HYPERTENSION 09/19/2007  . Hypokalemia   . LUNG NODULE 03/03/2008  . Lung nodules 03/03/2008   Qualifier: Diagnosis of  By: Marland Mcalpine    . MENOPAUSAL DISORDER 12/17/2008  . Peripheral autonomic neuropathy due to diabetes mellitus ([place]) 11/15/2015  . PERIPHERAL NEUROPATHY 06/19/2008  . Peripheral neuropathy ([place]) 06/19/2008   With chronic pain   . PERSONAL HISTORY MALIGNANT NEOPLASM STOMACH 09/19/2007  .  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 Rotator cuff tear arthropathy of both shoulders 12/09/2013   Injected under ultrasound guidance December 09, 2013 Left shoulder injected under ultrasound April 07, 2014 Left shoulder injected again in June 20, 2014   . STOMACH CANCER 03/03/2008  . Syncope 07/07/2016  . Tibial fracture 04/03/2017  . Type II diabetes  mellitus with peripheral circulatory disorder ([place]) 01/01/2016  . Ulcer of left heel and midfoot with necrosis of muscle ([place]) 01/17/2016  . Urinary incontinence 02/02/2017  . UTI 12/15/2009    Past Surgical History:  Procedure Laterality Date  . BALLOON DILATION N/A 07/12/2016   Procedure: BALLOON DILATION;  Surgeon: Manus Gunning, MD;  Location: [facility] ENDOSCOPY;  Service: Gastroenterology;  Laterality: N/A;  . BILROTH II PROCEDURE    . CATARACT EXTRACTION W/ INTRAOCULAR LENS  IMPLANT, BILATERAL    . COLONOSCOPY    . ESOPHAGOGASTRODUODENOSCOPY N/A 07/12/2016   Procedure: ESOPHAGOGASTRODUODENOSCOPY (EGD);  Surgeon: Manus Gunning, MD;  Location: [facility];  Service: Gastroenterology;  Laterality: N/A;  . FRACTURE SURGERY    . HIP FRACTURE SURGERY Right 03/2015    Allergies as of 07/07/2017      Reactions   Penicillins Shortness Of Breath, Swelling   Has patient had a PCN reaction causing immediate rash, facial/tongue/throat swelling, SOB or lightheadedness with hypotension: yes Has patient had a PCN reaction causing severe rash involving mucus membranes or skin necrosis: no Has patient had a PCN reaction that required hospitalization: unknown Has patient had a PCN reaction occurring within the last 10 years: no If all of the above answers are "NO", then may proceed with Cephalosporin use.   Voltaren [diclofenac Sodium] Other (See Comments)   Stomach irritation   Dulcolax [bisacodyl]    Unknown    Duloxetine Hcl Other (See Comments)   Made patient not want to eat or drink   Oxycodone Other (See Comments)   hallucinations   Timolol    REACTION: bradycardia worse      Medication List       Accurate as of 07/07/17  3:11 PM. Always use your most recent med list.          acetaminophen 325 MG tablet Commonly known as:  TYLENOL Take 2 tablets (650 mg total) by mouth 3 (three) times daily.   amLODipine 5 MG tablet Commonly known as:  NORVASC TAKE 1 TABLET (5 MG  TOTAL) BY MOUTH DAILY.   colchicine 0.6 MG tablet TAKE 1 TABLET (0.6 MG TOTAL) BY MOUTH DAILY AS NEEDED (GOUT PAIN.).   docusate sodium 100 MG capsule Commonly known as:  COLACE Take 1 capsule (100 mg total) by mouth 2 (two) times daily as needed for mild constipation.   donepezil 10 MG tablet Commonly known as:  ARICEPT TAKE 1 TABLET (10 MG TOTAL) BY MOUTH AT BEDTIME.   HYDROcodone-acetaminophen 5-325 MG tablet Commonly known as:  NORCO/VICODIN Take by mouth. 1 tablet every 6 hours for moderate pain, 2 tablets every 6 hours for severe pain   lactose free nutrition Liqd Take 237 mLs by mouth 3 (three) times daily between meals.   latanoprost 0.005 % ophthalmic solution Commonly known as:  XALATAN PLACE 1 DROP INTO BOTH EYES AT BEDTIME.   multivitamin with minerals tablet Take 1 tablet by mouth daily.   NAMENDA XR 28 MG Cp24 24 hr capsule Generic drug:  memantine TAKE 1 CAPSULE (28 MG TOTAL) BY MOUTH DAILY.   omeprazole 40 MG capsule Commonly known as:  PRILOSEC TAKE 1 CAPSULE (40 MG TOTAL) BY MOUTH DAILY.   ULORIC 40 MG tablet Generic drug:  febuxostat TAKE 1 TABLET (40 MG TOTAL) BY MOUTH DAILY.       No orders of the defined types were placed in this encounter.   Immunization History  Administered Date(s) Administered  . H1N1 12/17/2008  . Influenza Whole 08/27/2007, 09/30/2008, 07/31/2009, 07/31/2010  . Influenza-Unspecified 07/29/2015  . PPD Test 04/04/2017  . Pneumococcal Conjugate-13 11/22/2013  . Pneumococcal Polysaccharide-23 10/03/2007, 10/19/2011  . Td 12/15/2009  . Tdap 04/02/2017    Social History  Substance Use Topics  . Smoking status: Former Smoker    Packs/day: 0.50    Years: 25.00    Types: Cigarettes    Quit date: 11/01/1983  . Smokeless tobacco: Never Used     Comment: "stopped when she was 83 years old"  . Alcohol use No     Comment: 07/07/2016 "nothing in years; stopped maybe in the  1980s"    Review of Systems  DATA OBTAINED:  from patient, nurse GENERAL:  no fevers, fatigue, appetite changes SKIN: No itching, rash HEENT: No complaint RESPIRATORY: No cough, wheezing, SOB CARDIAC: No chest pain, palpitations, lower extremity edema  GI: No abdominal pain, No N/V/D or constipation, No heartburn or reflux  GU: No dysuria,+ frequency ,urgency, no incontinence  MUSCULOSKELETAL: No unrelieved bone/joint pain NEUROLOGIC: No headache, dizziness; right leg pain as per history of present illness  PSYCHIATRIC: No overt anxiety or sadness  Vitals:   07/07/17 1504  BP: (!) 132/51  Pulse: (!) 56  Resp: 18  Temp: (!) 97.2 F (36.2 C)   Body mass index is 22.24 kg/m. Physical Exam  GENERAL APPEARANCE: Alert, conversant, No acute distress  SKIN: No diaphoresis rash HEENT: Unremarkable RESPIRATORY: Breathing is even, unlabored. Lung sounds are clear   CARDIOVASCULAR: Heart RRR no murmurs, rubs or gallops. No peripheral edema  GASTROINTESTINAL: Abdomen is soft, non-tender, not distended w/ normal bowel sounds.  GENITOURINARY: Bladder non tender, not distended  MUSCULOSKELETAL: No abnormal joints or musculature NEUROLOGIC: Cranial nerves 2-12 grossly intact. Moves all extremities PSYCHIATRIC: Mood and affect with dementia, no behavioral issues  Patient Active Problem List   Diagnosis Date Noted  . Glaucoma, bilateral 05/21/2017  . Hypokalemia   . Fall 04/03/2017  . Closed right tibial fracture 04/03/2017  . Tibial fracture 04/03/2017  . Acute cystitis without hematuria   . Encounter for well adult exam with abnormal findings 02/02/2017  . Urinary incontinence 02/02/2017  . Seizure-like activity ([place])   . Syncope 07/07/2016  . Atherosclerosis of native arteries of the extremities with ulceration ([place]) 05/11/2016    Class: Chronic  . Weakness generalized   . Hypernatremia   . Aspiration pneumonia ([place])   . AKI (acute kidney injury) ([place])   . Ulcer of left heel and midfoot with necrosis of muscle ([place])  01/17/2016  . Type II diabetes mellitus with peripheral circulatory disorder ([place]) 01/01/2016  . Pressure ulcer 01/01/2016  . Peripheral autonomic neuropathy due to diabetes mellitus ([place]) 11/15/2015  . GERD without esophagitis 10/30/2015  . Alzheimer's dementia 07/28/2015  . Fracture of right femur ([place]) 07/28/2015  . Arthritis of knee 05/19/2015  . Anemia 02/25/2015  . Loss of weight 09/17/2014  . Chronic pain syndrome 06/16/2014  . Primary localized osteoarthrosis, lower leg 01/08/2014  . Rotator cuff tear arthropathy of both shoulders 12/09/2013  . CONSTIPATION, CHRONIC 10/15/2010  . Hyperlipidemia 01/27/2010  . Gastroparesis 12/24/2009  . Dysphagia 12/24/2009  .  E. coli UTI 12/15/2009  . DEPRESSION 06/17/2009  . Gouty arthropathy 02/17/2009  . BRADYCARDIA, CHRONIC 12/17/2008  . Peripheral neuropathy ([place]) 06/19/2008  . Lung nodules 03/03/2008  . GENERALIZED OSTEOARTHROSIS INVOLVING HAND 10/03/2007  . Gout 09/19/2007  . GLAUCOMA 09/19/2007  . Essential hypertension 09/19/2007    CMP     Component Value Date/Time   NA 140 04/10/2017   K 4.7 04/10/2017   CL 108 04/04/2017 0438   CO2 26 04/04/2017 0438   GLUCOSE 82 04/04/2017 0438   BUN 25 (A) 04/10/2017   CREATININE 1.2 (A) 04/10/2017   CREATININE 1.11 (H) 04/04/2017 0438   CALCIUM 8.1 (L) 04/04/2017 0438   PROT 7.4 02/02/2017 1449   ALBUMIN 3.6 02/02/2017 1449   AST 22 02/02/2017 1449   ALT 13 02/02/2017 1449   ALKPHOS 94 02/02/2017 1449   BILITOT 0.3 02/02/2017 1449   GFRNONAA 43 (L) 04/04/2017 0438   GFRAA 50 (L) 04/04/2017 0438    Recent Labs  02/02/17 1449  04/03/17 1959 04/04/17 0438 04/10/17  NA 141  < > 145  145 140  140 140  K 3.9  --  3.5  3.5 3.3* 4.7  CL 107  --  110 108  --   CO2 29  --  28 26  --   GLUCOSE 74  --  88 82  --   BUN 17  < > 21  21* 20  20 25*  CREATININE 0.97  < > 1.14*  1.1 1.11*  1.1 1.2*  CALCIUM 9.2  --  8.6* 8.1*  --   < > = values in this interval not  displayed.  Recent Labs  07/08/16 0203 02/02/17 1449  AST 19 22  ALT 9* 13  ALKPHOS 73 94  BILITOT 0.5 0.3  PROT 4.9* 7.4  ALBUMIN 2.3* 3.6    Recent Labs  02/02/17 1449  04/03/17 1959 04/04/17 0054 04/04/17 0438 04/10/17  WBC 6.7  < > 7.8  7.8 9.1  9.1 7.5  7.5 7.5  NEUTROABS 4.1  --  5.3  --   --   --   HGB 11.3*  --  8.6*  8.6* 8.9*  8.9* 7.9* 8.3*  HCT 34.3*  --  25.3*  25* 26.6*  27* 24.2* 27*  MCV 103.2*  --  97.3 97.8 98.0  --   PLT 134.0*  --  100*  100* 106*  106* 88* 182  < > = values in this interval not displayed.  Recent Labs  02/02/17 1449  CHOL 180  LDLCALC 89  TRIG 90.0   Lab Results  Component Value Date   MICROALBUR 27.2 (H) 02/02/2017   Lab Results  Component Value Date   TSH 1.07 02/02/2017   Lab Results  Component Value Date   HGBA1C 4.1 05/08/2017   Lab Results  Component Value Date   CHOL 180 02/02/2017   HDL 72.70 02/02/2017   LDLCALC 89 02/02/2017   LDLDIRECT 124.3 04/09/2010   TRIG 90.0 02/02/2017   CHOLHDL 2 02/02/2017    Significant Diagnostic Results in last 30 days:  Xr Tibia/fibula Right  Result Date: 07/05/2017 AP lateral right tib-fib reviewed.  Calcium formation and fracture healing of the transverse tibial plateau fracture is noted.  Severe end-stage arthritis present in all 3 compartments of the knee.  No new fractures identified alignment intact   Assessment and Plan  PERIPHERAL POLYNEUROPATHY ASSOCIATED WITH DM 2- started patient on Neurontin 100 mg by mouth 3 times a day;  we'll monitor response  OAB-has started patient on Ditropan 5 mg by mouth 3 times a day; we'll monitor response     Jerilynn Feldmeier D. Sheppard Coil, MD

## 2017-07-20 IMAGING — RF DG ESOPHAGUS
7 of 10 series · 12 of 19 positions shown · non-contrast
Comparison: CT Abdomen and Pelvis 05/24/2016.  Chest CT 10/30/2014.

CLINICAL DATA: 89-year-old female with malnutrition and dysphagia.
Suspicion of esophageal abnormality on speech pathology exam. 6348
esophagram demonstrating mild narrowing of the distal esophagus.
Personal history of Billroth II procedure, reportedly for stomach
cancer.

Initial encounter.
EXAM:
ESOPHOGRAM/BARIUM SWALLOW
TECHNIQUE: Single contrast examination was performed using  thin barium.
FLUOROSCOPY TIME:  Fluoroscopy Time:  2 minutes 30 seconds
Radiation Exposure Index (if provided by the fluoroscopic device):
Number of Acquired Spot Images: 0

[Series 1: cp_standard · 0.53mm/px · 3 of 31 frames shown (1 of 7)]
[frame 5/31]
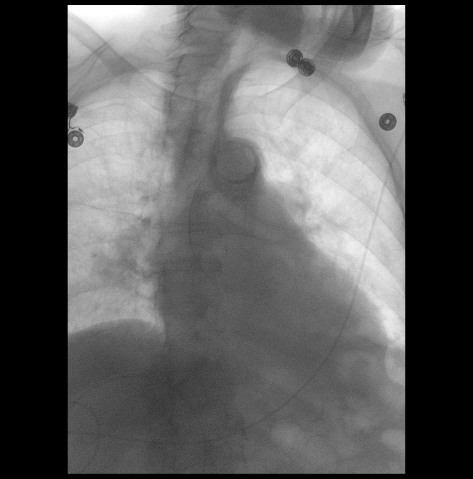
[frame 16/31]
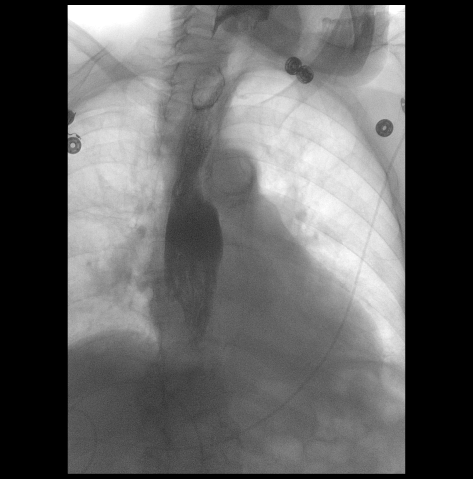
[frame 27/31]
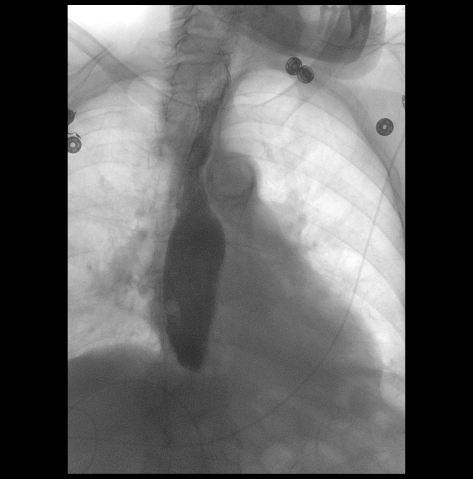

[Series 2: cp_standard · 0.53mm/px · 2 of 28 frames shown (2 of 7)]
[frame 15/28]
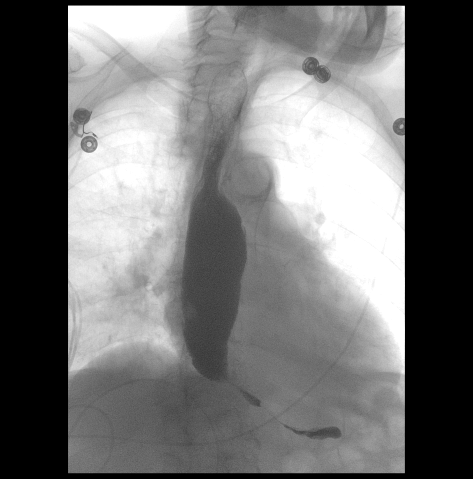
[frame 28/28]
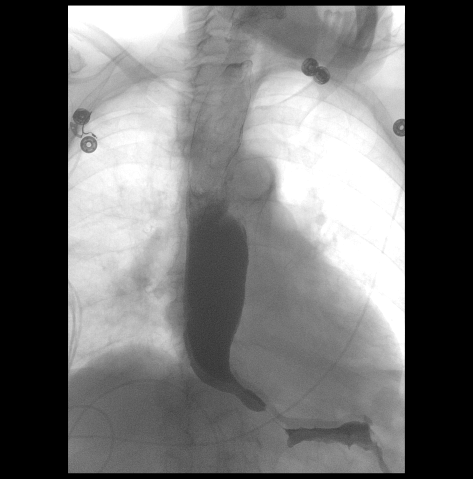

[Series 3: cp_standard · 0.18mm/px · 1 of 1 slices shown (3 of 7)]
[im 1/1]
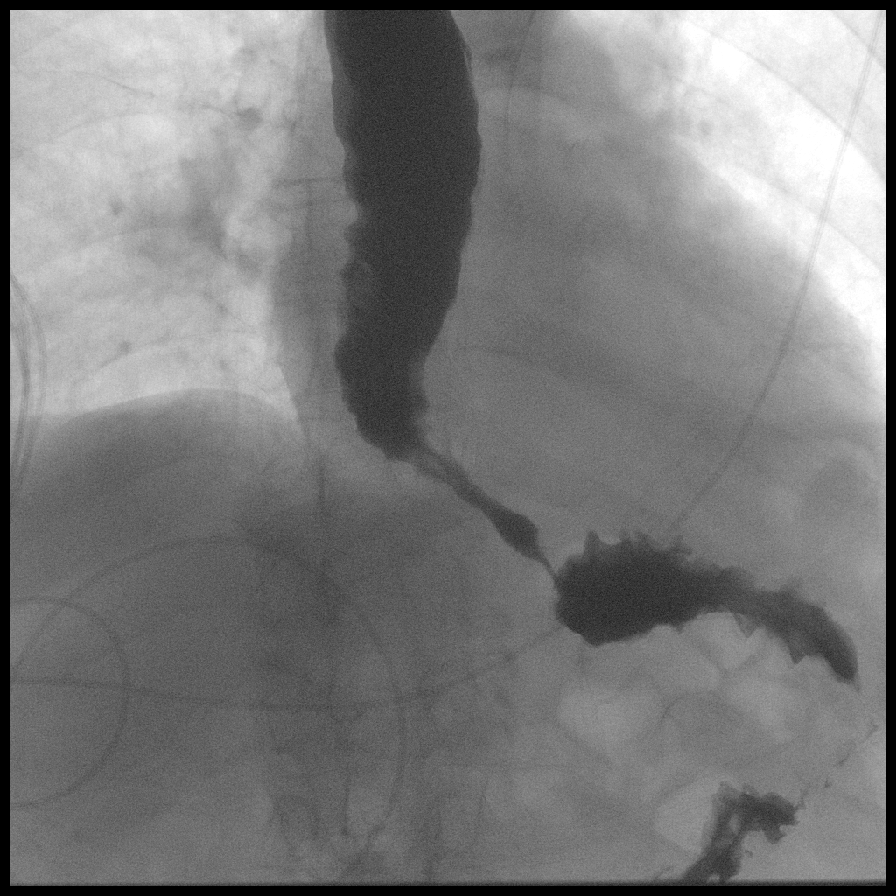

[Series 5: cp_standard · 0.36mm/px · 3 of 8 frames shown (4 of 7)]
[frame 2/8]
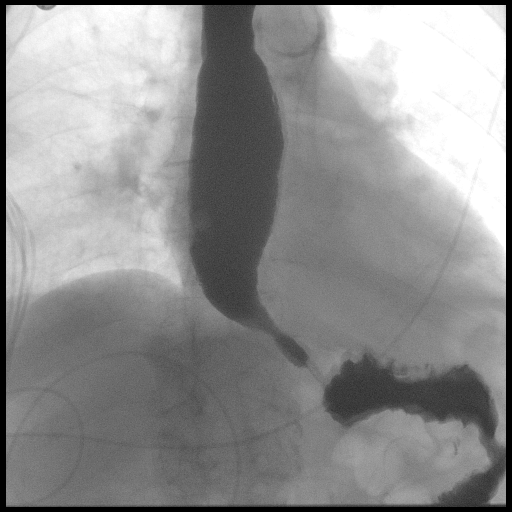
[frame 5/8]
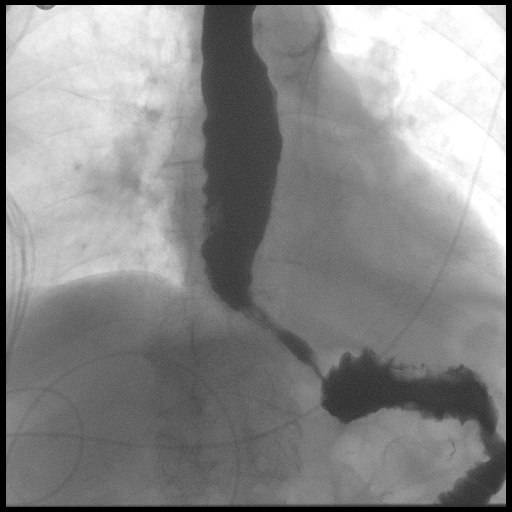
[frame 8/8]
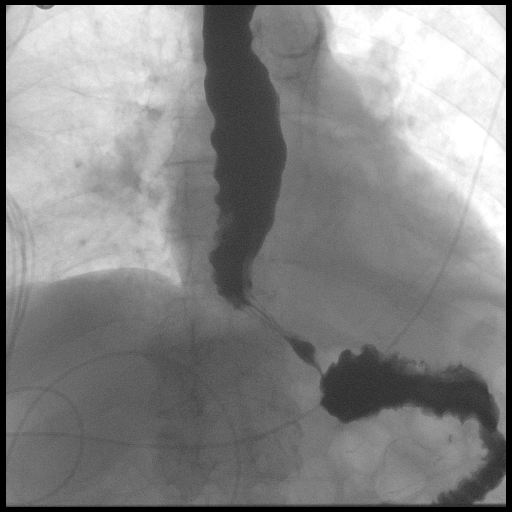

[Series 7: cp_standard · 0.18mm/px · 1 of 1 slices shown (5 of 7)]
[im 1/1]
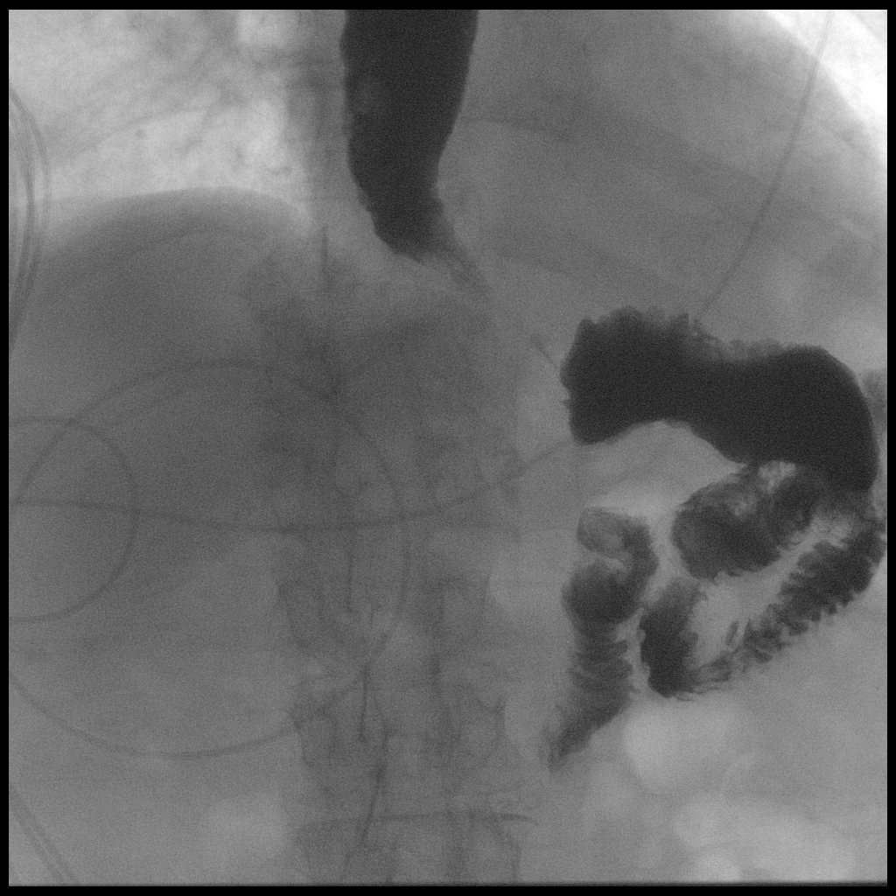

[Series 8: cp_standard · 0.18mm/px · 1 of 1 slices shown (6 of 7)]
[im 1/1]
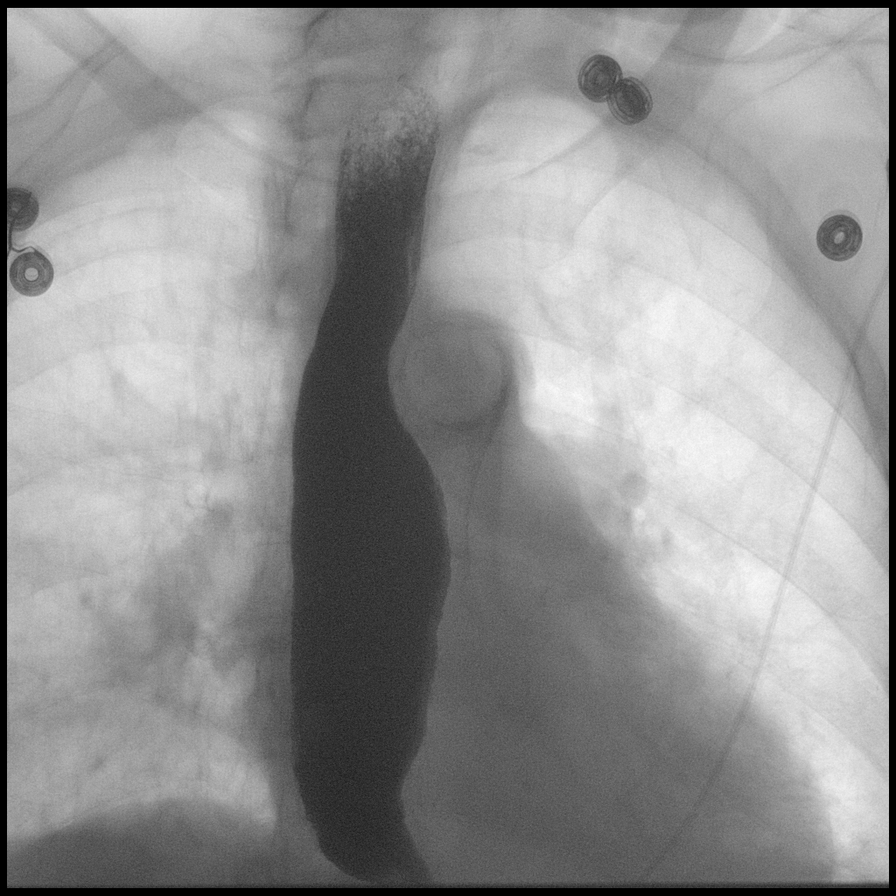

[Series 10: cp_standard · 0.18mm/px · 1 of 1 slices shown (7 of 7)]
[im 1/1]
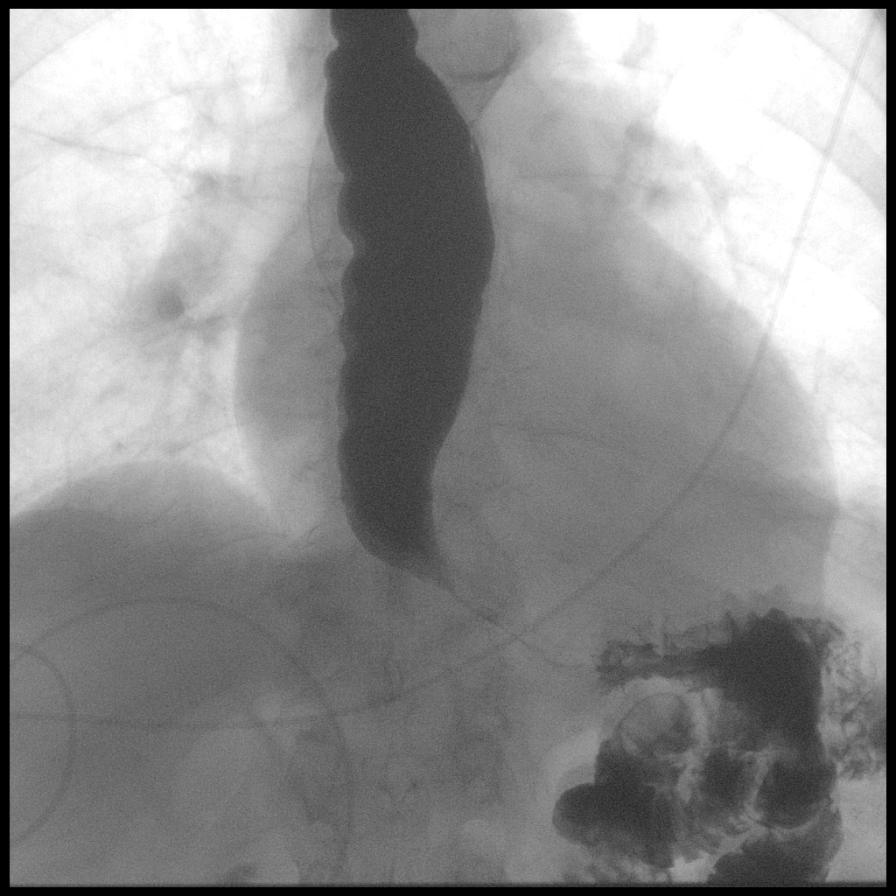

[12 of 19 positions shown; findings below may reference images not displayed]

FINDINGS: The patient ingested thin barium.

Early images suggest retained food debris in the esophagus (series
1, image 31).

There is an approximately 3 cm segment of progressed narrowing of
the distal thoracic esophagus to the level of the gastroesophageal
junction. See series 2, image 27, and compare to series 17, image 1
in 6348. This area of narrowing appears fairly smooth, but
maintained a beaked appearance throughout much of the exam.
Superimposed decreased esophageal motility and tertiary contractions
occurred.

The contrast which did reach the stomach rapidly emptied into the
small bowel via a gastrojejunostomy. Proximal small bowel loops are
in the left upper quadrant.

After a 10 minutes delay perhaps about half to [DATE] of the ingested
thin barium remained within the esophagus as a column of barium
which extended to the thoracic inlet (series 8 and series 9).
IMPRESSION: 1. Progressed 3 cm segment of distal esophageal narrowing since the
6348 exam.
On correlating today's esophogram appearance with the recent CT
Abdomen and Pelvis in [REDACTED] I do note some extrinsic compression on
the distal esophagus caused by the descending thoracic aorta, the
heart, and IVC. Although that extrinsic compression seems unlikely
to fully explain the esophagram appearance now whereby [DATE] to [DATE] of
the ingested volume of barium was retained in the thoracic esophagus
after a delay of 10 minutes.
Favor progressive esophageal stricture.
The smooth appearance of the narrowing, as well as the appearance on
the recent CT Abdomen and Pelvis do NOT suggest recurrent gastric
cancer as the cause of the narrowing.
EGD follow-up may be most valuable at this point.
2. Superimposed presbyesophagus with tertiary contractions.
3. Patent gastrojejunostomy.

## 2017-07-30 ENCOUNTER — Encounter: Payer: Self-pay | Admitting: Internal Medicine

## 2017-07-30 DIAGNOSIS — N3281 Overactive bladder: Secondary | ICD-10-CM | POA: Insufficient documentation

## 2017-08-04 ENCOUNTER — Non-Acute Institutional Stay (SKILLED_NURSING_FACILITY): Payer: Medicare Other | Admitting: Internal Medicine

## 2017-08-04 ENCOUNTER — Encounter: Payer: Self-pay | Admitting: Internal Medicine

## 2017-08-04 DIAGNOSIS — I10 Essential (primary) hypertension: Secondary | ICD-10-CM

## 2017-08-04 DIAGNOSIS — M10041 Idiopathic gout, right hand: Secondary | ICD-10-CM

## 2017-08-04 DIAGNOSIS — K219 Gastro-esophageal reflux disease without esophagitis: Secondary | ICD-10-CM

## 2017-08-04 NOTE — Progress Notes (Signed)
Location:  [facility] Room Number: [phone] Place of Service:  SNF [phone])  Provider: Noah Delaine. Sheppard Coil, MD  Biagio Borg, MD  Patient Care Team: Biagio Borg, MD as PCP - General (Internal Medicine) Pa, [place] (Specialist) Newt Minion, MD as Consulting Physician (Orthopedic Surgery) Marlou Sa Tonna Corner, MD as Consulting Physician (Orthopedic Surgery)  Extended Emergency Contact Information Primary Emergency Contact: [facility] Address: [place], [place] [address] [address] Phone: [phone] Mobile Phone: [phone] Relation: Daughter Secondary Emergency Contact: Murphy,Jacqueline Address: [address]          [place], [place] Phone: [phone] Mobile Phone: [phone] Relation: Daughter    Allergies: Penicillins; Voltaren [diclofenac sodium]; Dulcolax [bisacodyl]; Duloxetine hcl; Oxycodone; and Timolol  Chief Complaint  Patient presents with  . Medical Management of Chronic Issues    routine visit    HPI: Patient is 83 y.o. female who Is being seen for routine issues of hypertension, GERD, and gout.  Past Medical History:  Diagnosis Date  . ABDOMINAL PAIN, CHRONIC 04/07/2008  . Alzheimer's dementia 07/28/2015  . Anemia 02/25/2015  . Aneurysm of thoracic aorta ([place])    "found 03/2015; not big enough to repair" (07/07/2016)  . ARTHRITIS 03/03/2008  . Arthritis    "shoulders, knees, hands" (07/07/2016)  . Arthritis of knee, degenerative 10/19/2011  . Aspiration pneumonia ([place])   . Atherosclerosis of native arteries of the extremities with ulceration ([place]) 05/11/2016  . BRADYCARDIA, CHRONIC 12/17/2008  . CARDIAC MURMUR 02/01/2010  . Chronic pain syndrome 06/16/2014  . Closed right tibial fracture 04/03/2017  . Complication of anesthesia    "she retains carbon dioxide; sleeps too long" (07/07/2016)  . CONSTIPATION, CHRONIC 10/15/2010  . DEGENERATIVE  JOINT DISEASE, KNEES, BILATERAL 12/07/2007  . DEPRESSION 06/17/2009  . DIABETES MELLITUS, TYPE II 09/19/2007   DIET CONTROL   . DVT (deep venous thrombosis) (HCC) BLE  . Dysphagia 12/24/2009   Qualifier: Diagnosis of  By: Varney Daily RN, Butch Penny    . DYSPHAGIA UNSPECIFIED 12/24/2009  . E. coli UTI 12/15/2009   Qualifier: Diagnosis of  By: Jenny Reichmann MD, Hunt Oris   . Fracture of right femur [facility]) 07/28/2015   S/p ORIF in 03/2015   . Gastroparesis 12/24/2009  . GENERALIZED OSTEOARTHROSIS INVOLVING HAND 10/03/2007  . GERD (gastroesophageal reflux disease)   . GERD without esophagitis 10/30/2015  . GLAUCOMA 09/19/2007  . GOUT 09/19/2007  . GOUTY ARTHROPATHY UNSPECIFIED 02/17/2009  . Hepatitis   . HYPERLIPIDEMIA 01/27/2010  . HYPERTENSION 09/19/2007  . Hypokalemia   . LUNG NODULE 03/03/2008  . Lung nodules 03/03/2008   Qualifier: Diagnosis of  By: Marland Mcalpine    . MENOPAUSAL DISORDER 12/17/2008  . Peripheral autonomic neuropathy due to diabetes mellitus ([place]) 11/15/2015  . PERIPHERAL NEUROPATHY 06/19/2008  . Peripheral neuropathy ([place]) 06/19/2008   With chronic pain   . PERSONAL HISTORY MALIGNANT NEOPLASM STOMACH 09/19/2007  . Polyneuropathy due to other toxic agents ([place]) 09/19/2007  . Rotator cuff tear arthropathy of both shoulders 12/09/2013   Injected under ultrasound guidance December 09, 2013 Left shoulder injected under ultrasound April 07, 2014 Left shoulder injected again in June 20, 2014   . STOMACH CANCER 03/03/2008  . Syncope 07/07/2016  . Tibial fracture 04/03/2017  . Type II diabetes mellitus with peripheral circulatory disorder ([place]) 01/01/2016  . Ulcer of left heel and midfoot with  necrosis of muscle ([place]) 01/17/2016  . Urinary incontinence 02/02/2017  . UTI 12/15/2009    Past Surgical History:  Procedure Laterality Date  . BALLOON DILATION N/A 07/12/2016   Procedure: BALLOON DILATION;  Surgeon: Manus Gunning, MD;  Location: [facility] ENDOSCOPY;  Service: Gastroenterology;  Laterality: N/A;  .  BILROTH II PROCEDURE    . CATARACT EXTRACTION W/ INTRAOCULAR LENS  IMPLANT, BILATERAL    . COLONOSCOPY    . ESOPHAGOGASTRODUODENOSCOPY N/A 07/12/2016   Procedure: ESOPHAGOGASTRODUODENOSCOPY (EGD);  Surgeon: Manus Gunning, MD;  Location: [place];  Service: Gastroenterology;  Laterality: N/A;  . FRACTURE SURGERY    . HIP FRACTURE SURGERY Right 03/2015    Allergies as of 08/04/2017      Reactions   Penicillins Shortness Of Breath, Swelling   Has patient had a PCN reaction causing immediate rash, facial/tongue/throat swelling, SOB or lightheadedness with hypotension: yes Has patient had a PCN reaction causing severe rash involving mucus membranes or skin necrosis: no Has patient had a PCN reaction that required hospitalization: unknown Has patient had a PCN reaction occurring within the last 10 years: no If all of the above answers are "NO", then may proceed with Cephalosporin use.   Voltaren [diclofenac Sodium] Other (See Comments)   Stomach irritation   Dulcolax [bisacodyl]    Unknown    Duloxetine Hcl Other (See Comments)   Made patient not want to eat or drink   Oxycodone Other (See Comments)   hallucinations   Timolol    REACTION: bradycardia worse      Medication List       Accurate as of 08/04/17 11:59 PM. Always use your most recent med list.          acetaminophen 325 MG tablet Commonly known as:  TYLENOL Take 2 tablets (650 mg total) by mouth 3 (three) times daily.   amLODipine 5 MG tablet Commonly known as:  NORVASC TAKE 1 TABLET (5 MG TOTAL) BY MOUTH DAILY.   colchicine 0.6 MG tablet TAKE 1 TABLET (0.6 MG TOTAL) BY MOUTH DAILY AS NEEDED (GOUT PAIN.).   docusate sodium 100 MG capsule Commonly known as:  COLACE Take 1 capsule (100 mg total) by mouth 2 (two) times daily as needed for mild constipation.   donepezil 10 MG tablet Commonly known as:  ARICEPT TAKE 1 TABLET (10 MG TOTAL) BY MOUTH AT BEDTIME.   gabapentin 100 MG capsule Commonly known  as:  NEURONTIN Take 100 mg by mouth. Take one capsule three times daily for neuropathy   HYDROcodone-acetaminophen 5-325 MG tablet Commonly known as:  NORCO/VICODIN Take by mouth. 1 tablet every 6 hours for moderate pain, 2 tablets every 6 hours for severe pain   lactose free nutrition Liqd Take 237 mLs by mouth 3 (three) times daily between meals.   latanoprost 0.005 % ophthalmic solution Commonly known as:  XALATAN PLACE 1 DROP INTO BOTH EYES AT BEDTIME.   multivitamin with minerals tablet Take 1 tablet by mouth daily.   NAMENDA XR 28 MG Cp24 24 hr capsule Generic drug:  memantine TAKE 1 CAPSULE (28 MG TOTAL) BY MOUTH DAILY.   omeprazole 40 MG capsule Commonly known as:  PRILOSEC TAKE 1 CAPSULE (40 MG TOTAL) BY MOUTH DAILY.   oxybutynin 5 MG 24 hr tablet Commonly known as:  DITROPAN-XL Take 5 mg by mouth. Take one tablet three times daily for overactive bladder   ULORIC 40 MG tablet Generic drug:  febuxostat TAKE 1 TABLET (40 MG TOTAL) BY MOUTH  DAILY.       Meds ordered this encounter  Medications  . oxybutynin (DITROPAN-XL) 5 MG 24 hr tablet    Sig: Take 5 mg by mouth. Take one tablet three times daily for overactive bladder  . gabapentin (NEURONTIN) 100 MG capsule    Sig: Take 100 mg by mouth. Take one capsule three times daily for neuropathy    Immunization History  Administered Date(s) Administered  . H1N1 12/17/2008  . Influenza Whole 08/27/2007, 09/30/2008, 07/31/2009, 07/31/2010  . Influenza-Unspecified 07/29/2015  . PPD Test 04/04/2017  . Pneumococcal Conjugate-13 11/22/2013  . Pneumococcal Polysaccharide-23 10/03/2007, 10/19/2011  . Td 12/15/2009  . Tdap 04/02/2017    Social History  Substance Use Topics  . Smoking status: Former Smoker    Packs/day: 0.50    Years: 25.00    Types: Cigarettes    Quit date: 11/01/1983  . Smokeless tobacco: Never Used     Comment: "stopped when she was 83 years old"  . Alcohol use No     Comment: 07/07/2016  "nothing in years; stopped maybe in the  1980s"    Review of Systems  DATA OBTAINED: from patient GENERAL:  no fevers, fatigue, appetite changes SKIN: No itching, rash HEENT: No complaint RESPIRATORY: No cough, wheezing, SOB CARDIAC: No chest pain, palpitations, lower extremity edema  GI: No abdominal pain, No N/V/D or constipation, No heartburn or reflux  GU: No dysuria, frequency or urgency, or incontinence  MUSCULOSKELETAL: No unrelieved bone/joint pain NEUROLOGIC: No headache, dizziness  PSYCHIATRIC: No overt anxiety or sadness  Vitals:   08/04/17 1524  BP: 128/61  Pulse: 78  Resp: 20  Temp: 97.7 F (36.5 C)   Body mass index is 21.95 kg/m. Physical Exam  GENERAL APPEARANCE: Alert, conversant, No acute distress  SKIN: No diaphoresis rash HEENT: Unremarkable RESPIRATORY: Breathing is even, unlabored. Lung sounds are clear   CARDIOVASCULAR: Heart RRR no murmurs, rubs or gallops. No peripheral edema  GASTROINTESTINAL: Abdomen is soft, non-tender, not distended w/ normal bowel sounds.  GENITOURINARY: Bladder non tender, not distended  MUSCULOSKELETAL: No abnormal joints or musculature NEUROLOGIC: Cranial nerves 2-12 grossly intact. Moves all extremities PSYCHIATRIC: Mood and affect appropriate to situation, no behavioral issues  Physical exam has not changed since prior visit.  Patient Active Problem List   Diagnosis Date Noted  . Overactive bladder 07/30/2017  . Glaucoma, bilateral 05/21/2017  . Hypokalemia   . Fall 04/03/2017  . Closed right tibial fracture 04/03/2017  . Tibial fracture 04/03/2017  . Acute cystitis without hematuria   . Encounter for well adult exam with abnormal findings 02/02/2017  . Urinary incontinence 02/02/2017  . Seizure-like activity ([place])   . Syncope 07/07/2016  . Atherosclerosis of native arteries of the extremities with ulceration ([place]) 05/11/2016    Class: Chronic  . Weakness generalized   . Hypernatremia   . Aspiration  pneumonia ([place])   . AKI (acute kidney injury) ([place])   . Ulcer of left heel and midfoot with necrosis of muscle ([place]) 01/17/2016  . Type II diabetes mellitus with peripheral circulatory disorder ([place]) 01/01/2016  . Pressure ulcer 01/01/2016  . Peripheral autonomic neuropathy due to diabetes mellitus ([place]) 11/15/2015  . GERD without esophagitis 10/30/2015  . Alzheimer's dementia 07/28/2015  . Diabetes mellitus, type 2 ([place]) 07/28/2015  . Fracture of right femur ([place]) 07/28/2015  . Arthritis of knee 05/19/2015  . Anemia 02/25/2015  . Loss of weight 09/17/2014  . Chronic pain syndrome 06/16/2014  . Primary localized osteoarthrosis, lower leg  01/08/2014  . Rotator cuff tear arthropathy of both shoulders 12/09/2013  . CONSTIPATION, CHRONIC 10/15/2010  . Hyperlipidemia 01/27/2010  . Gastroparesis 12/24/2009  . Dysphagia 12/24/2009  . E. coli UTI 12/15/2009  . DEPRESSION 06/17/2009  . Gouty arthropathy 02/17/2009  . BRADYCARDIA, CHRONIC 12/17/2008  . Diabetic peripheral neuropathy associated with type 2 diabetes mellitus ([place]) 06/19/2008  . Lung nodules 03/03/2008  . GENERALIZED OSTEOARTHROSIS INVOLVING HAND 10/03/2007  . Gout 09/19/2007  . GLAUCOMA 09/19/2007  . Essential hypertension 09/19/2007    CMP     Component Value Date/Time   NA 140 04/10/2017   K 4.7 04/10/2017   CL 108 04/04/2017 0438   CO2 26 04/04/2017 0438   GLUCOSE 82 04/04/2017 0438   BUN 25 (A) 04/10/2017   CREATININE 1.2 (A) 04/10/2017   CREATININE 1.11 (H) 04/04/2017 0438   CALCIUM 8.1 (L) 04/04/2017 0438   PROT 7.4 02/02/2017 1449   ALBUMIN 3.6 02/02/2017 1449   AST 22 02/02/2017 1449   ALT 13 02/02/2017 1449   ALKPHOS 94 02/02/2017 1449   BILITOT 0.3 02/02/2017 1449   GFRNONAA 43 (L) 04/04/2017 0438   GFRAA 50 (L) 04/04/2017 0438    Recent Labs  02/02/17 1449  04/03/17 1959 04/04/17 0438 04/10/17  NA 141  < > 145  145 140  140 140  K 3.9  --  3.5  3.5 3.3* 4.7  CL 107  --  110 108  --     CO2 29  --  28 26  --   GLUCOSE 74  --  88 82  --   BUN 17  < > 21  21* 20  20 25*  CREATININE 0.97  < > 1.14*  1.1 1.11*  1.1 1.2*  CALCIUM 9.2  --  8.6* 8.1*  --   < > = values in this interval not displayed.  Recent Labs  02/02/17 1449  AST 22  ALT 13  ALKPHOS 94  BILITOT 0.3  PROT 7.4  ALBUMIN 3.6    Recent Labs  02/02/17 1449  04/03/17 1959 04/04/17 0054 04/04/17 0438 04/10/17  WBC 6.7  < > 7.8  7.8 9.1  9.1 7.5  7.5 7.5  NEUTROABS 4.1  --  5.3  --   --   --   HGB 11.3*  --  8.6*  8.6* 8.9*  8.9* 7.9* 8.3*  HCT 34.3*  --  25.3*  25* 26.6*  27* 24.2* 27*  MCV 103.2*  --  97.3 97.8 98.0  --   PLT 134.0*  --  100*  100* 106*  106* 88* 182  < > = values in this interval not displayed.  Recent Labs  02/02/17 1449  CHOL 180  LDLCALC 89  TRIG 90.0   Lab Results  Component Value Date   MICROALBUR 27.2 (H) 02/02/2017   Lab Results  Component Value Date   TSH 1.07 02/02/2017   Lab Results  Component Value Date   HGBA1C 4.1 05/08/2017   Lab Results  Component Value Date   CHOL 180 02/02/2017   HDL 72.70 02/02/2017   LDLCALC 89 02/02/2017   LDLDIRECT 124.3 04/09/2010   TRIG 90.0 02/02/2017   CHOLHDL 2 02/02/2017    Significant Diagnostic Results in last 30 days:  No results found.  Assessment and Plan  Essential hypertension Controlled; continue Norvasc 5 mg by mouth daily  GERD without esophagitis No reports of reflux; continue omeprazole 40 mg by mouth daily  Gout No reported flares; plan  to continue colchicine 0.6 mg when necessary     Webb Silversmith D. Sheppard Coil, MD

## 2017-08-06 ENCOUNTER — Encounter: Payer: Self-pay | Admitting: Internal Medicine

## 2017-08-06 NOTE — Assessment & Plan Note (Signed)
No reports of reflux; continue omeprazole 40 mg by mouth daily

## 2017-08-06 NOTE — Assessment & Plan Note (Signed)
Controlled; continue Norvasc 5 mg by mouth daily

## 2017-08-06 NOTE — Assessment & Plan Note (Signed)
No reported flares; plan to continue colchicine 0.6 mg when necessary

## 2017-08-07 ENCOUNTER — Ambulatory Visit (INDEPENDENT_AMBULATORY_CARE_PROVIDER_SITE_OTHER): Payer: Medicare Other | Admitting: Orthopaedic Surgery

## 2017-08-07 ENCOUNTER — Encounter (INDEPENDENT_AMBULATORY_CARE_PROVIDER_SITE_OTHER): Payer: Self-pay | Admitting: Orthopaedic Surgery

## 2017-08-07 ENCOUNTER — Non-Acute Institutional Stay (SKILLED_NURSING_FACILITY): Payer: Medicare Other | Admitting: Internal Medicine

## 2017-08-07 ENCOUNTER — Ambulatory Visit (INDEPENDENT_AMBULATORY_CARE_PROVIDER_SITE_OTHER): Payer: Medicare Other

## 2017-08-07 ENCOUNTER — Encounter: Payer: Self-pay | Admitting: Internal Medicine

## 2017-08-07 DIAGNOSIS — M1711 Unilateral primary osteoarthritis, right knee: Secondary | ICD-10-CM

## 2017-08-07 DIAGNOSIS — S8991XA Unspecified injury of right lower leg, initial encounter: Secondary | ICD-10-CM

## 2017-08-07 DIAGNOSIS — M25561 Pain in right knee: Secondary | ICD-10-CM

## 2017-08-07 DIAGNOSIS — R600 Localized edema: Secondary | ICD-10-CM | POA: Diagnosis not present

## 2017-08-07 MED ORDER — METHYLPREDNISOLONE ACETATE 40 MG/ML IJ SUSP
40.0000 mg | INTRAMUSCULAR | Status: AC | PRN
  Administered 2017-08-07: 40 mg via INTRA_ARTICULAR

## 2017-08-07 MED ORDER — LIDOCAINE HCL 1 % IJ SOLN
2.0000 mL | INTRAMUSCULAR | Status: AC | PRN
  Administered 2017-08-07: 2 mL

## 2017-08-07 MED ORDER — BUPIVACAINE HCL 0.5 % IJ SOLN
2.0000 mL | INTRAMUSCULAR | Status: AC | PRN
  Administered 2017-08-07: 2 mL via INTRA_ARTICULAR
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
## 2017-08-07 NOTE — Progress Notes (Signed)
Location:  [facility] Room Number: [place]:  [phone])  Provider: Noah Delaine. Sheppard Coil, MD  Biagio Borg, MD  Patient Care Team: Biagio Borg, MD as PCP - General (Internal Medicine) Pa, [place] (Specialist) Newt Minion, MD as Consulting Physician (Orthopedic Surgery) Marlou Sa Tonna Corner, MD as Consulting Physician (Orthopedic Surgery)  Extended Emergency Contact Information Primary Emergency Contact: [facility] Address: [place], [place] [address] [address] of [place] Phone: [phone] Mobile Phone: [phone] Relation: Daughter Secondary Emergency Contact: Murphy,Jacqueline Address: [address]          [place], [place] Phone: [phone] Mobile Phone: [phone] Relation: Daughter    Allergies: Penicillins; Voltaren [diclofenac sodium]; Dulcolax [bisacodyl]; Duloxetine hcl; Oxycodone; and Timolol  Chief Complaint  Patient presents with  . Acute Visit    Right knee injury    HPI: Patient is 83 y.o. female who Is being seen acutely for acute knee pain. Patient was with family on Sunday reportedly twisted her knee getting out of the car and heard a pop. At the time of the injury patient was able to still ambulate and when patient went to bed on Sunday night there was still no pain. This morning patient woke up with a very swollen and painful knee. There is no redness or heat. Patient complains of pain from mid thigh down to her great toe but the only areas that are swollen R on the knee proper. There does not appear to be any joint line tenderness. Patient would not allow movement of the leg and complained of tenderness to palpation to thigh and anterior lower leg.  Past Medical History:  Diagnosis Date  . ABDOMINAL PAIN, CHRONIC 04/07/2008  . Alzheimer's dementia 07/28/2015  . Anemia 02/25/2015  . Aneurysm of thoracic aorta ([place])    "found  03/2015; not big enough to repair" (07/07/2016)  . ARTHRITIS 03/03/2008  . Arthritis    "shoulders, knees, hands" (07/07/2016)  . Arthritis of knee, degenerative 10/19/2011  . Aspiration pneumonia ([place])   . Atherosclerosis of native arteries of the extremities with ulceration ([place]) 05/11/2016  . BRADYCARDIA, CHRONIC 12/17/2008  . CARDIAC MURMUR 02/01/2010  . Chronic pain syndrome 06/16/2014  . Closed right tibial fracture 04/03/2017  . Complication of anesthesia    "she retains carbon dioxide; sleeps too long" (07/07/2016)  . CONSTIPATION, CHRONIC 10/15/2010  . DEGENERATIVE JOINT DISEASE, KNEES, BILATERAL 12/07/2007  . DEPRESSION 06/17/2009  . DIABETES MELLITUS, TYPE II 09/19/2007   DIET CONTROL   . DVT (deep venous thrombosis) (HCC) BLE  . Dysphagia 12/24/2009   Qualifier: Diagnosis of  By: Varney Daily RN, Butch Penny    . DYSPHAGIA UNSPECIFIED 12/24/2009  . E. coli UTI 12/15/2009   Qualifier: Diagnosis of  By: Jenny Reichmann MD, Hunt Oris   . Fracture of right femur [facility]) 07/28/2015   S/p ORIF in 03/2015   . Gastroparesis 12/24/2009  . GENERALIZED OSTEOARTHROSIS INVOLVING HAND 10/03/2007  . GERD (gastroesophageal reflux disease)   . GERD without esophagitis 10/30/2015  . GLAUCOMA 09/19/2007  . GOUT 09/19/2007  . GOUTY ARTHROPATHY UNSPECIFIED 02/17/2009  . Hepatitis   . HYPERLIPIDEMIA 01/27/2010  . HYPERTENSION 09/19/2007  . Hypokalemia   . LUNG NODULE 03/03/2008  . Lung nodules 03/03/2008   Qualifier: Diagnosis of  By: Marland Mcalpine    . MENOPAUSAL DISORDER 12/17/2008  . Peripheral autonomic neuropathy  due to diabetes mellitus ([place]) 11/15/2015  . PERIPHERAL NEUROPATHY 06/19/2008  . Peripheral neuropathy ([place]) 06/19/2008   With chronic pain   . PERSONAL HISTORY MALIGNANT NEOPLASM STOMACH 09/19/2007  . 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 Rotator cuff tear arthropathy of both shoulders 12/09/2013   Injected under ultrasound guidance December 09, 2013 Left shoulder injected under ultrasound April 07, 2014 Left shoulder injected again in June 20, 2014   . STOMACH CANCER 03/03/2008  . Syncope 07/07/2016  . Tibial fracture 04/03/2017  . Type II diabetes mellitus with peripheral circulatory disorder ([place]) 01/01/2016  . Ulcer of left heel and midfoot with necrosis of muscle ([place]) 01/17/2016  . Urinary incontinence 02/02/2017  . UTI 12/15/2009    Past Surgical History:  Procedure Laterality Date  . BALLOON DILATION N/A 07/12/2016   Procedure: BALLOON DILATION;  Surgeon: Manus Gunning, MD;  Location: [facility] ENDOSCOPY;  Service: Gastroenterology;  Laterality: N/A;  . BILROTH II PROCEDURE    . CATARACT EXTRACTION W/ INTRAOCULAR LENS  IMPLANT, BILATERAL    . COLONOSCOPY    . ESOPHAGOGASTRODUODENOSCOPY N/A 07/12/2016   Procedure: ESOPHAGOGASTRODUODENOSCOPY (EGD);  Surgeon: Manus Gunning, MD;  Location: [place];  Service: Gastroenterology;  Laterality: N/A;  . FRACTURE SURGERY    . HIP FRACTURE SURGERY Right 03/2015    Allergies as of 08/07/2017      Reactions   Penicillins Shortness Of Breath, Swelling   Has patient had a PCN reaction causing immediate rash, facial/tongue/throat swelling, SOB or lightheadedness with hypotension: yes Has patient had a PCN reaction causing severe rash involving mucus membranes or skin necrosis: no Has patient had a PCN reaction that required hospitalization: unknown Has patient had a PCN reaction occurring within the last 10 years: no If all of the above answers are "NO", then may proceed with Cephalosporin use.   Voltaren [diclofenac Sodium] Other (See Comments)   Stomach irritation   Dulcolax [bisacodyl]    Unknown    Duloxetine Hcl Other (See Comments)   Made patient not want to eat or drink   Oxycodone Other (See Comments)   hallucinations   Timolol    REACTION: bradycardia worse      Medication List       Accurate as of 08/07/17  6:03 PM. Always use your most recent med list.          acetaminophen 325 MG tablet Commonly known as:   TYLENOL Take 2 tablets (650 mg total) by mouth 3 (three) times daily.   amLODipine 5 MG tablet Commonly known as:  NORVASC TAKE 1 TABLET (5 MG TOTAL) BY MOUTH DAILY.   colchicine 0.6 MG tablet TAKE 1 TABLET (0.6 MG TOTAL) BY MOUTH DAILY AS NEEDED (GOUT PAIN.).   docusate sodium 100 MG capsule Commonly known as:  COLACE Take 1 capsule (100 mg total) by mouth 2 (two) times daily as needed for mild constipation.   donepezil 10 MG tablet Commonly known as:  ARICEPT TAKE 1 TABLET (10 MG TOTAL) BY MOUTH AT BEDTIME.   gabapentin 100 MG capsule Commonly known as:  NEURONTIN Take 100 mg by mouth. Take one capsule three times daily for neuropathy   HYDROcodone-acetaminophen 5-325 MG tablet Commonly known as:  NORCO/VICODIN Take by mouth. 1 tablet every 6 hours for moderate pain, 2 tablets every 6 hours for severe pain   lactose free nutrition Liqd Take 237 mLs by mouth 3 (three) times daily between meals.   latanoprost 0.005 % ophthalmic solution  Commonly known as:  XALATAN PLACE 1 DROP INTO BOTH EYES AT BEDTIME.   multivitamin with minerals tablet Take 1 tablet by mouth daily.   NAMENDA XR 28 MG Cp24 24 hr capsule Generic drug:  memantine TAKE 1 CAPSULE (28 MG TOTAL) BY MOUTH DAILY.   omeprazole 40 MG capsule Commonly known as:  PRILOSEC TAKE 1 CAPSULE (40 MG TOTAL) BY MOUTH DAILY.   oxybutynin 5 MG 24 hr tablet Commonly known as:  DITROPAN-XL Take 5 mg by mouth. Take one tablet three times daily for overactive bladder   ULORIC 40 MG tablet Generic drug:  febuxostat TAKE 1 TABLET (40 MG TOTAL) BY MOUTH DAILY.       No orders of the defined types were placed in this encounter.   Immunization History  Administered Date(s) Administered  . H1N1 12/17/2008  . Influenza Whole 08/27/2007, 09/30/2008, 07/31/2009, 07/31/2010  . Influenza-Unspecified 07/29/2015  . PPD Test 04/04/2017  . Pneumococcal Conjugate-13 11/22/2013  . Pneumococcal Polysaccharide-23 10/03/2007,  10/19/2011  . Td 12/15/2009  . Tdap 04/02/2017    Social History  Substance Use Topics  . Smoking status: Former Smoker    Packs/day: 0.50    Years: 25.00    Types: Cigarettes    Quit date: 11/01/1983  . Smokeless tobacco: Never Used     Comment: "stopped when she was 83 years old"  . Alcohol use No     Comment: 07/07/2016 "nothing in years; stopped maybe in the  1980s"    Review of Systems  DATA OBTAINED: from patient, nurse GENERAL:  no fevers, fatigue, appetite changes SKIN: No itching, rash HEENT: No complaint RESPIRATORY: No cough, wheezing, SOB CARDIAC: No chest pain, palpitations, lower extremity edema  GI: No abdominal pain, No N/V/D or constipation, No heartburn or reflux  GU: No dysuria, frequency or urgency, or incontinence  MUSCULOSKELETAL: Pain right knee as per history of present illness NEUROLOGIC: No headache, dizziness  PSYCHIATRIC: No overt anxiety or sadness  Vitals:   08/07/17 1527  BP: 128/61  Pulse: 78  Resp: 20  Temp: 97.6 F (36.4 C)   Body mass index is 21.95 kg/m. Physical Exam  GENERAL APPEARANCE: Alert, conversant, appears uncomfortable SKIN: No diaphoresis rash HEENT: Unremarkable RESPIRATORY: Breathing is even, unlabored. Lung sounds are clear   CARDIOVASCULAR: Heart RRR no murmurs, rubs or gallops. No peripheral edema  GASTROINTESTINAL: Abdomen is soft, non-tender, not distended w/ normal bowel sounds.  GENITOURINARY: Bladder non tender, not distended  MUSCULOSKELETAL: Diffuse swelling overall right knee, no apparent effusion, no apparent joint line tenderness; was unable to flex or extend knee secondary to patient cooperation NEUROLOGIC: Cranial nerves 2-12 grossly intact. Moves all extremities PSYCHIATRIC: Mood and affect appropriate to situation with some dementia, no behavioral issues  Patient Active Problem List   Diagnosis Date Noted  . Overactive bladder 07/30/2017  . Glaucoma, bilateral 05/21/2017  . Hypokalemia   . Fall  04/03/2017  . Closed right tibial fracture 04/03/2017  . Tibial fracture 04/03/2017  . Acute cystitis without hematuria   . Encounter for well adult exam with abnormal findings 02/02/2017  . Urinary incontinence 02/02/2017  . Seizure-like activity ([place])   . Syncope 07/07/2016  . Atherosclerosis of native arteries of the extremities with ulceration ([place]) 05/11/2016    Class: Chronic  . Weakness generalized   . Hypernatremia   . Aspiration pneumonia ([place])   . AKI (acute kidney injury) ([place])   . Ulcer of left heel and midfoot with necrosis of muscle ([place]) 01/17/2016  .  Type II diabetes mellitus with peripheral circulatory disorder ([place]) 01/01/2016  . Pressure ulcer 01/01/2016  . Peripheral autonomic neuropathy due to diabetes mellitus ([place]) 11/15/2015  . GERD without esophagitis 10/30/2015  . Alzheimer's dementia 07/28/2015  . Diabetes mellitus, type 2 ([place]) 07/28/2015  . Fracture of right femur ([place]) 07/28/2015  . Arthritis of knee 05/19/2015  . Anemia 02/25/2015  . Loss of weight 09/17/2014  . Chronic pain syndrome 06/16/2014  . Primary localized osteoarthrosis, lower leg 01/08/2014  . Rotator cuff tear arthropathy of both shoulders 12/09/2013  . CONSTIPATION, CHRONIC 10/15/2010  . Hyperlipidemia 01/27/2010  . Gastroparesis 12/24/2009  . Dysphagia 12/24/2009  . E. coli UTI 12/15/2009  . DEPRESSION 06/17/2009  . Gouty arthropathy 02/17/2009  . BRADYCARDIA, CHRONIC 12/17/2008  . Diabetic peripheral neuropathy associated with type 2 diabetes mellitus ([place]) 06/19/2008  . Lung nodules 03/03/2008  . GENERALIZED OSTEOARTHROSIS INVOLVING HAND 10/03/2007  . Gout 09/19/2007  . GLAUCOMA 09/19/2007  . Essential hypertension 09/19/2007    CMP     Component Value Date/Time   NA 140 04/10/2017   K 4.7 04/10/2017   CL 108 04/04/2017 0438   CO2 26 04/04/2017 0438   GLUCOSE 82 04/04/2017 0438   BUN 25 (A) 04/10/2017   CREATININE 1.2 (A) 04/10/2017   CREATININE 1.11 (H) 04/04/2017  0438   CALCIUM 8.1 (L) 04/04/2017 0438   PROT 7.4 02/02/2017 1449   ALBUMIN 3.6 02/02/2017 1449   AST 22 02/02/2017 1449   ALT 13 02/02/2017 1449   ALKPHOS 94 02/02/2017 1449   BILITOT 0.3 02/02/2017 1449   GFRNONAA 43 (L) 04/04/2017 0438   GFRAA 50 (L) 04/04/2017 0438    Recent Labs  02/02/17 1449  04/03/17 1959 04/04/17 0438 04/10/17  NA 141  < > 145  145 140  140 140  K 3.9  --  3.5  3.5 3.3* 4.7  CL 107  --  110 108  --   CO2 29  --  28 26  --   GLUCOSE 74  --  88 82  --   BUN 17  < > 21  21* 20  20 25*  CREATININE 0.97  < > 1.14*  1.1 1.11*  1.1 1.2*  CALCIUM 9.2  --  8.6* 8.1*  --   < > = values in this interval not displayed.  Recent Labs  02/02/17 1449  AST 22  ALT 13  ALKPHOS 94  BILITOT 0.3  PROT 7.4  ALBUMIN 3.6    Recent Labs  02/02/17 1449  04/03/17 1959 04/04/17 0054 04/04/17 0438 04/10/17  WBC 6.7  < > 7.8  7.8 9.1  9.1 7.5  7.5 7.5  NEUTROABS 4.1  --  5.3  --   --   --   HGB 11.3*  --  8.6*  8.6* 8.9*  8.9* 7.9* 8.3*  HCT 34.3*  --  25.3*  25* 26.6*  27* 24.2* 27*  MCV 103.2*  --  97.3 97.8 98.0  --   PLT 134.0*  --  100*  100* 106*  106* 88* 182  < > = values in this interval not displayed.  Recent Labs  02/02/17 1449  CHOL 180  LDLCALC 89  TRIG 90.0   Lab Results  Component Value Date   MICROALBUR 27.2 (H) 02/02/2017   Lab Results  Component Value Date   TSH 1.07 02/02/2017   Lab Results  Component Value Date   HGBA1C 4.1 05/08/2017   Lab Results  Component Value  Date   CHOL 180 02/02/2017   HDL 72.70 02/02/2017   LDLCALC 89 02/02/2017   LDLDIRECT 124.3 04/09/2010   TRIG 90.0 02/02/2017   CHOLHDL 2 02/02/2017    Significant Diagnostic Results in last 30 days:  No results found.  Assessment and Plan  RIGHT KNEE INJURY-x-ray showed distal lateral femur fracture suspected to be old, and osteoarthritis; the late onset of pain and swelling suggests a soft tissue injury; patient was given Tylenol without  pain relief and then patient was given a Norco; patient's son is with her and the unit secretary was able to get her an orthopedic appointment today as an outpatient; will follow-up results.  Time spent greater than 25 minutes Anne  D. Sheppard Coil, MD

## 2017-08-21 ENCOUNTER — Inpatient Hospital Stay (HOSPITAL_COMMUNITY)
Admission: EM | Admit: 2017-08-21 | Discharge: 2017-08-31 | DRG: 871 | Disposition: E | Payer: Medicare Other | Attending: Family Medicine | Admitting: Family Medicine

## 2017-08-21 ENCOUNTER — Ambulatory Visit (INDEPENDENT_AMBULATORY_CARE_PROVIDER_SITE_OTHER): Payer: Medicare Other | Admitting: Orthopaedic Surgery

## 2017-08-21 ENCOUNTER — Encounter (HOSPITAL_COMMUNITY): Payer: Self-pay | Admitting: *Deleted

## 2017-08-21 ENCOUNTER — Emergency Department (HOSPITAL_COMMUNITY): Payer: Medicare Other

## 2017-08-21 ENCOUNTER — Inpatient Hospital Stay (HOSPITAL_COMMUNITY): Payer: Medicare Other

## 2017-08-21 DIAGNOSIS — F0280 Dementia in other diseases classified elsewhere without behavioral disturbance: Secondary | ICD-10-CM | POA: Diagnosis present

## 2017-08-21 DIAGNOSIS — J9601 Acute respiratory failure with hypoxia: Secondary | ICD-10-CM | POA: Diagnosis not present

## 2017-08-21 DIAGNOSIS — R7989 Other specified abnormal findings of blood chemistry: Secondary | ICD-10-CM | POA: Insufficient documentation

## 2017-08-21 DIAGNOSIS — D539 Nutritional anemia, unspecified: Secondary | ICD-10-CM | POA: Diagnosis present

## 2017-08-21 DIAGNOSIS — E1143 Type 2 diabetes mellitus with diabetic autonomic (poly)neuropathy: Secondary | ICD-10-CM | POA: Diagnosis not present

## 2017-08-21 DIAGNOSIS — R4189 Other symptoms and signs involving cognitive functions and awareness: Secondary | ICD-10-CM | POA: Diagnosis not present

## 2017-08-21 DIAGNOSIS — Z803 Family history of malignant neoplasm of breast: Secondary | ICD-10-CM | POA: Diagnosis not present

## 2017-08-21 DIAGNOSIS — R1310 Dysphagia, unspecified: Secondary | ICD-10-CM

## 2017-08-21 DIAGNOSIS — Z66 Do not resuscitate: Secondary | ICD-10-CM | POA: Diagnosis not present

## 2017-08-21 DIAGNOSIS — G309 Alzheimer's disease, unspecified: Secondary | ICD-10-CM | POA: Diagnosis not present

## 2017-08-21 DIAGNOSIS — Z8042 Family history of malignant neoplasm of prostate: Secondary | ICD-10-CM | POA: Diagnosis not present

## 2017-08-21 DIAGNOSIS — K219 Gastro-esophageal reflux disease without esophagitis: Secondary | ICD-10-CM | POA: Diagnosis present

## 2017-08-21 DIAGNOSIS — Z79899 Other long term (current) drug therapy: Secondary | ICD-10-CM

## 2017-08-21 DIAGNOSIS — N390 Urinary tract infection, site not specified: Secondary | ICD-10-CM | POA: Insufficient documentation

## 2017-08-21 DIAGNOSIS — A419 Sepsis, unspecified organism: Secondary | ICD-10-CM | POA: Diagnosis not present

## 2017-08-21 DIAGNOSIS — Y95 Nosocomial condition: Secondary | ICD-10-CM | POA: Diagnosis present

## 2017-08-21 DIAGNOSIS — G308 Other Alzheimer's disease: Secondary | ICD-10-CM

## 2017-08-21 DIAGNOSIS — E162 Hypoglycemia, unspecified: Secondary | ICD-10-CM | POA: Diagnosis not present

## 2017-08-21 DIAGNOSIS — E11649 Type 2 diabetes mellitus with hypoglycemia without coma: Secondary | ICD-10-CM | POA: Diagnosis not present

## 2017-08-21 DIAGNOSIS — R918 Other nonspecific abnormal finding of lung field: Secondary | ICD-10-CM | POA: Diagnosis not present

## 2017-08-21 DIAGNOSIS — Z85028 Personal history of other malignant neoplasm of stomach: Secondary | ICD-10-CM

## 2017-08-21 DIAGNOSIS — R569 Unspecified convulsions: Secondary | ICD-10-CM | POA: Diagnosis present

## 2017-08-21 DIAGNOSIS — R402441 Other coma, without documented Glasgow coma scale score, or with partial score reported, in the field [EMT or ambulance]: Secondary | ICD-10-CM | POA: Diagnosis not present

## 2017-08-21 DIAGNOSIS — Z880 Allergy status to penicillin: Secondary | ICD-10-CM | POA: Diagnosis not present

## 2017-08-21 DIAGNOSIS — N289 Disorder of kidney and ureter, unspecified: Secondary | ICD-10-CM | POA: Diagnosis present

## 2017-08-21 DIAGNOSIS — Z800 Family history of malignant neoplasm of digestive organs: Secondary | ICD-10-CM

## 2017-08-21 DIAGNOSIS — Z8049 Family history of malignant neoplasm of other genital organs: Secondary | ICD-10-CM | POA: Diagnosis not present

## 2017-08-21 DIAGNOSIS — R6521 Severe sepsis with septic shock: Secondary | ICD-10-CM | POA: Diagnosis not present

## 2017-08-21 DIAGNOSIS — E1151 Type 2 diabetes mellitus with diabetic peripheral angiopathy without gangrene: Secondary | ICD-10-CM | POA: Diagnosis present

## 2017-08-21 DIAGNOSIS — I959 Hypotension, unspecified: Secondary | ICD-10-CM | POA: Diagnosis not present

## 2017-08-21 DIAGNOSIS — J189 Pneumonia, unspecified organism: Secondary | ICD-10-CM | POA: Diagnosis present

## 2017-08-21 DIAGNOSIS — J9691 Respiratory failure, unspecified with hypoxia: Secondary | ICD-10-CM | POA: Diagnosis not present

## 2017-08-21 DIAGNOSIS — I10 Essential (primary) hypertension: Secondary | ICD-10-CM | POA: Diagnosis not present

## 2017-08-21 DIAGNOSIS — Z823 Family history of stroke: Secondary | ICD-10-CM | POA: Diagnosis not present

## 2017-08-21 DIAGNOSIS — Z515 Encounter for palliative care: Secondary | ICD-10-CM | POA: Diagnosis not present

## 2017-08-21 DIAGNOSIS — E785 Hyperlipidemia, unspecified: Secondary | ICD-10-CM | POA: Diagnosis present

## 2017-08-21 DIAGNOSIS — H409 Unspecified glaucoma: Secondary | ICD-10-CM | POA: Diagnosis present

## 2017-08-21 LAB — COMPREHENSIVE METABOLIC PANEL
ALBUMIN: 2.1 g/dL — AB (ref 3.5–5.0)
ALK PHOS: 161 U/L — AB (ref 38–126)
ALK PHOS: 129 U/L — AB (ref 38–126)
ALT: 14 U/L (ref 14–54)
ALT: 12 U/L — ABNORMAL LOW (ref 14–54)
ANION GAP: 11 (ref 5–15)
ANION GAP: 7 (ref 5–15)
AST: 36 U/L (ref 15–41)
AST: 31 U/L (ref 15–41)
Albumin: 1.7 g/dL — ABNORMAL LOW (ref 3.5–5.0)
BILIRUBIN TOTAL: 0.5 mg/dL (ref 0.3–1.2)
BUN: 24 mg/dL — ABNORMAL HIGH (ref 6–20)
BUN: 22 mg/dL — ABNORMAL HIGH (ref 6–20)
CALCIUM: 8.5 mg/dL — AB (ref 8.9–10.3)
CALCIUM: 7.5 mg/dL — AB (ref 8.9–10.3)
CHLORIDE: 111 mmol/L (ref 101–111)
CO2: 16 mmol/L — AB (ref 22–32)
CO2: 13 mmol/L — ABNORMAL LOW (ref 22–32)
CREATININE: 1.26 mg/dL — AB (ref 0.44–1.00)
Chloride: 117 mmol/L — ABNORMAL HIGH (ref 101–111)
Creatinine, Ser: 1.15 mg/dL — ABNORMAL HIGH (ref 0.44–1.00)
GFR calc non Af Amer: 37 mL/min — ABNORMAL LOW (ref >60)
GFR calc non Af Amer: 41 mL/min — ABNORMAL LOW (ref >60)
GFR, EST AFRICAN AMERICAN: 43 mL/min — AB (ref >60)
GFR, EST AFRICAN AMERICAN: 48 mL/min — AB (ref >60)
GLUCOSE: 66 mg/dL (ref 65–99)
Glucose, Bld: 128 mg/dL — ABNORMAL HIGH (ref 65–99)
Potassium: 4 mmol/L (ref 3.5–5.1)
Potassium: 3.1 mmol/L — ABNORMAL LOW (ref 3.5–5.1)
SODIUM: 138 mmol/L (ref 135–145)
SODIUM: 137 mmol/L (ref 135–145)
Total Bilirubin: 0.4 mg/dL (ref 0.3–1.2)
Total Protein: 5.9 g/dL — ABNORMAL LOW (ref 6.5–8.1)
Total Protein: 4.6 g/dL — ABNORMAL LOW (ref 6.5–8.1)

## 2017-08-21 LAB — LACTIC ACID, PLASMA
LACTIC ACID, VENOUS: 6 mmol/L — AB (ref 0.5–1.9)
LACTIC ACID, VENOUS: 5.3 mmol/L — AB (ref 0.5–1.9)
Lactic Acid, Venous: 4.4 mmol/L (ref 0.5–1.9)

## 2017-08-21 LAB — CBC WITH DIFFERENTIAL/PLATELET
BASOS ABS: 0 10*3/uL (ref 0.0–0.1)
Basophils Absolute: 0 K/uL (ref 0.0–0.1)
Basophils Relative: 0 %
Basophils Relative: 0 %
Eosinophils Absolute: 0 K/uL (ref 0.0–0.7)
Eosinophils Absolute: 0 10*3/uL (ref 0.0–0.7)
Eosinophils Relative: 0 %
Eosinophils Relative: 0 %
HCT: 34.5 % — ABNORMAL LOW (ref 36.0–46.0)
HEMATOCRIT: 29.7 % — AB (ref 36.0–46.0)
HEMOGLOBIN: 9.1 g/dL — AB (ref 12.0–15.0)
Hemoglobin: 11.2 g/dL — ABNORMAL LOW (ref 12.0–15.0)
LYMPHS ABS: 0.2 10*3/uL — AB (ref 0.7–4.0)
Lymphocytes Relative: 7 %
Lymphocytes Relative: 3 %
Lymphs Abs: 0.5 K/uL — ABNORMAL LOW (ref 0.7–4.0)
MCH: 34.1 pg — ABNORMAL HIGH (ref 26.0–34.0)
MCH: 32.7 pg (ref 26.0–34.0)
MCHC: 32.5 g/dL (ref 30.0–36.0)
MCHC: 30.6 g/dL (ref 30.0–36.0)
MCV: 105.2 fL — ABNORMAL HIGH (ref 78.0–100.0)
MCV: 106.8 fL — ABNORMAL HIGH (ref 78.0–100.0)
MONOS PCT: 2 %
Monocytes Absolute: 0.1 K/uL (ref 0.1–1.0)
Monocytes Absolute: 0.2 10*3/uL (ref 0.1–1.0)
Monocytes Relative: 1 %
NEUTROS ABS: 7.7 10*3/uL (ref 1.7–7.7)
Neutro Abs: 6.5 K/uL (ref 1.7–7.7)
Neutrophils Relative %: 92 %
Neutrophils Relative %: 95 %
Platelets: 171 K/uL (ref 150–400)
Platelets: 152 10*3/uL (ref 150–400)
RBC: 3.28 MIL/uL — ABNORMAL LOW (ref 3.87–5.11)
RBC: 2.78 MIL/uL — ABNORMAL LOW (ref 3.87–5.11)
RDW: 18.8 % — ABNORMAL HIGH (ref 11.5–15.5)
RDW: 18.4 % — ABNORMAL HIGH (ref 11.5–15.5)
WBC: 7.1 K/uL (ref 4.0–10.5)
WBC: 8.1 10*3/uL (ref 4.0–10.5)

## 2017-08-21 LAB — I-STAT TROPONIN, ED: Troponin i, poc: 0.03 ng/mL (ref 0.00–0.08)

## 2017-08-21 LAB — PROCALCITONIN: PROCALCITONIN: 25.21 ng/mL

## 2017-08-21 LAB — URINALYSIS, ROUTINE W REFLEX MICROSCOPIC
Bilirubin Urine: NEGATIVE
GLUCOSE, UA: NEGATIVE mg/dL
Ketones, ur: NEGATIVE mg/dL
NITRITE: NEGATIVE
Protein, ur: 30 mg/dL — AB
SPECIFIC GRAVITY, URINE: 1.012 (ref 1.005–1.030)
pH: 5 (ref 5.0–8.0)

## 2017-08-21 LAB — PROTIME-INR
INR: 1.36
Prothrombin Time: 16.6 seconds — ABNORMAL HIGH (ref 11.4–15.2)

## 2017-08-21 LAB — I-STAT CG4 LACTIC ACID, ED: Lactic Acid, Venous: 5.17 mmol/L (ref 0.5–1.9)

## 2017-08-21 LAB — CBG MONITORING, ED
GLUCOSE-CAPILLARY: 50 mg/dL — AB (ref 65–99)
Glucose-Capillary: 58 mg/dL — ABNORMAL LOW (ref 65–99)
Glucose-Capillary: 110 mg/dL — ABNORMAL HIGH (ref 65–99)

## 2017-08-21 LAB — APTT: aPTT: 50 seconds — ABNORMAL HIGH (ref 24–36)

## 2017-08-21 MED ORDER — DEXTROSE 5 % IV SOLN
2.0000 g | Freq: Once | INTRAVENOUS | Status: AC
  Administered 2017-08-21: 2 g via INTRAVENOUS
  Filled 2017-08-21: qty 2

## 2017-08-21 MED ORDER — MORPHINE SULFATE (PF) 4 MG/ML IV SOLN
INTRAVENOUS | Status: AC
  Administered 2017-08-21: 2 mg
  Filled 2017-08-21: qty 1

## 2017-08-21 MED ORDER — GLYCOPYRROLATE 0.2 MG/ML IJ SOLN: 0.2000 mg | INTRAMUSCULAR | Status: DC | PRN

## 2017-08-21 MED ORDER — LORAZEPAM 2 MG/ML IJ SOLN: 1.0000 mg | INTRAMUSCULAR | Status: DC | PRN

## 2017-08-21 MED ORDER — SODIUM CHLORIDE 0.9 % IV SOLN
1000.0000 mg | Freq: Once | INTRAVENOUS | Status: AC
  Administered 2017-08-21: 1000 mg via INTRAVENOUS
  Filled 2017-08-21: qty 10

## 2017-08-21 MED ORDER — FENTANYL CITRATE (PF) 100 MCG/2ML IJ SOLN
50.0000 ug | Freq: Once | INTRAMUSCULAR | Status: AC
  Administered 2017-08-21: 50 ug via INTRAVENOUS
  Filled 2017-08-21: qty 2

## 2017-08-21 MED ORDER — DEXTROSE 5 % IV SOLN
1.0000 g | Freq: Three times a day (TID) | INTRAVENOUS | Status: DC
  Filled 2017-08-21 (×2): qty 1

## 2017-08-21 MED ORDER — DEXTROSE 5 % IV SOLN: 1.0000 g | INTRAVENOUS | Status: DC

## 2017-08-21 MED ORDER — MORPHINE SULFATE (PF) 4 MG/ML IV SOLN: 4.0000 mg | INTRAVENOUS | Status: DC | PRN

## 2017-08-21 MED ORDER — NOREPINEPHRINE BITARTRATE 1 MG/ML IV SOLN
0.0000 ug/min | Freq: Once | INTRAVENOUS | Status: AC
  Administered 2017-08-21: 15 ug/min via INTRAVENOUS
  Filled 2017-08-21: qty 4

## 2017-08-21 MED ORDER — DEXTROSE 50 % IV SOLN: 1.0000 | Freq: Once | INTRAVENOUS | Status: DC

## 2017-08-21 MED ORDER — HYDROCODONE-ACETAMINOPHEN 5-325 MG PO TABS: 1.0000 | ORAL_TABLET | Freq: Three times a day (TID) | ORAL | Status: DC

## 2017-08-21 MED ORDER — DEXTROSE 50 % IV SOLN
1.0000 | Freq: Once | INTRAVENOUS | Status: AC
  Administered 2017-08-21: 50 mL via INTRAVENOUS
  Filled 2017-08-21: qty 50

## 2017-08-21 MED ORDER — SODIUM CHLORIDE 0.9 % IV BOLUS (SEPSIS)
1000.0000 mL | Freq: Once | INTRAVENOUS | Status: AC
  Administered 2017-08-21: 1000 mL via INTRAVENOUS

## 2017-08-21 MED ORDER — ENOXAPARIN SODIUM 40 MG/0.4ML ~~LOC~~ SOLN: 40.0000 mg | SUBCUTANEOUS | Status: DC

## 2017-08-21 MED ORDER — VANCOMYCIN HCL IN DEXTROSE 1-5 GM/200ML-% IV SOLN
1000.0000 mg | Freq: Once | INTRAVENOUS | Status: AC
  Administered 2017-08-21: 1000 mg via INTRAVENOUS
  Filled 2017-08-21: qty 200

## 2017-08-21 MED ORDER — SODIUM CHLORIDE 0.9 % IV SOLN: INTRAVENOUS | Status: DC

## 2017-08-26 LAB — CULTURE, BLOOD (ROUTINE X 2)
Culture: NO GROWTH
Culture: NO GROWTH
Culture: NO GROWTH
Culture: NO GROWTH
SPECIAL REQUESTS: ADEQUATE
SPECIAL REQUESTS: ADEQUATE
SPECIAL REQUESTS: ADEQUATE
Special Requests: ADEQUATE

## 2017-08-31 NOTE — Progress Notes (Signed)
1526 Received pt from ED, unresponsive. On non-rebreather mask at 10 Li. Pt's family at the bedside. Emotional support given to family. Palliative care notified and came in.

## 2017-08-31 NOTE — Progress Notes (Signed)
Palliative Medicine RN Note: Consult order noted. RN Morey Hummingbird on 6N called for assist with comfort care orders.  I met with patient's two daughters and multiple grandchildren. Confirmed what they understood of the discussion with Dr Nelda Marseille, including very poor prognosis with life expectancy likely measured in minutes to hours. They verbalize that goal is comfort above all else.  Patient is on NRB with some barely audible secretions; we discussed the use of Robinul and that it is likely not causing Mrs Vahey any distress. Face is relaxed, no guarding, no grimace, no moaning. Resp even and unlabored. Per family, she has gotten one dose of morphine (at 1232 today per [facility]). We discussed the futility of IV fluids and IV antibiotics at this time in her dying process; family verbalized understand. We also discussed obtaining VS and labs, neither of which will alter her treatment or outcomes; labs cancelled and VS changed to once daily.  I spoke with Dr Hilma Favors re: pt, family goals, likely imminent demise. She gave orders for comfort meds and treatments and continued ordered morphine. Discussed these orders with the family. IV will be saline locked. Do not escalate care. As of this afternoon, Mrs Santoli is too unstable to transfer, with most recent BP 57/32. Plan for f/u by PMT tomorrow am in the unlikely event Mrs Massar lives through the night.  PMT is available until 1900 for problems and recommendations. After that time, please call attending, as PMT does not have 24 hour coverage.  Marjie Skiff Taletha Twiford, RN, BSN, [facility] 09-08-17 4:54 PM Cell [phone] 8:00-4:00 Monday-Friday Office [phone]

## 2017-08-31 NOTE — H&P (Addendum)
History and Physical    Shari Mcmahon LZJ:[id number] DOB: 03/03/1927 DOA: Sep 12, 2017  PCP: Biagio Borg, MD  Patient coming from:  SNF  Chief Complaint:   Found unresponsive  HPI: Shari Mcmahon is a 83 y.o. female with medical history significant of dementia, HTN, aspiration pna, chronic pain, DM comes in from SNF unresponsive as code stroke.  No other history from SNF.  All history from dr Roxanne Mins and ed RN.  Pt sbp was in the 70s initially.  She has DNR paper at bedside.  Neurology has seen the patient and ruled out this is a stroke, ct head is pending.  Pt referred for admission for likely septic shock.  She is more responsive since arrival, she knows her name.  Her bp is much improved after 2 liters ivf.  She cannot provide any history.  Pt was given vanc and cefepime.  Also keppra for possible seizure.  Review of Systems: unobtainable from pt due to dementia  Past Medical History:  Diagnosis Date  . ABDOMINAL PAIN, CHRONIC 04/07/2008  . Alzheimer's dementia 07/28/2015  . Anemia 02/25/2015  . Aneurysm of thoracic aorta ([place])    "found 03/2015; not big enough to repair" (07/07/2016)  . ARTHRITIS 03/03/2008  . Arthritis    "shoulders, knees, hands" (07/07/2016)  . Arthritis of knee, degenerative 10/19/2011  . Aspiration pneumonia ([place])   . Atherosclerosis of native arteries of the extremities with ulceration ([place]) 05/11/2016  . BRADYCARDIA, CHRONIC 12/17/2008  . CARDIAC MURMUR 02/01/2010  . Chronic pain syndrome 06/16/2014  . Closed right tibial fracture 04/03/2017  . Complication of anesthesia    "she retains carbon dioxide; sleeps too long" (07/07/2016)  . CONSTIPATION, CHRONIC 10/15/2010  . DEGENERATIVE JOINT DISEASE, KNEES, BILATERAL 12/07/2007  . DEPRESSION 06/17/2009  . DIABETES MELLITUS, TYPE II 09/19/2007   DIET CONTROL   . DVT (deep venous thrombosis) ([facility]) BLE  . Dysphagia 12/24/2009   Qualifier: Diagnosis of  By: Varney Daily RN, Butch Penny    . DYSPHAGIA UNSPECIFIED 12/24/2009  . E. coli  UTI 12/15/2009   Qualifier: Diagnosis of  By: Jenny Reichmann MD, Hunt Oris   . Fracture of right femur [facility]) 07/28/2015   S/p ORIF in 03/2015   . Gastroparesis 12/24/2009  . GENERALIZED OSTEOARTHROSIS INVOLVING HAND 10/03/2007  . GERD (gastroesophageal reflux disease)   . GERD without esophagitis 10/30/2015  . GLAUCOMA 09/19/2007  . GOUT 09/19/2007  . GOUTY ARTHROPATHY UNSPECIFIED 02/17/2009  . Hepatitis   . HYPERLIPIDEMIA 01/27/2010  . HYPERTENSION 09/19/2007  . Hypokalemia   . LUNG NODULE 03/03/2008  . Lung nodules 03/03/2008   Qualifier: Diagnosis of  By: Marland Mcalpine    . MENOPAUSAL DISORDER 12/17/2008  . Peripheral autonomic neuropathy due to diabetes mellitus ([place]) 11/15/2015  . PERIPHERAL NEUROPATHY 06/19/2008  . Peripheral neuropathy ([place]) 06/19/2008   With chronic pain   . PERSONAL HISTORY MALIGNANT NEOPLASM STOMACH 09/19/2007  . 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 Rotator cuff tear arthropathy of both shoulders 12/09/2013   Injected under ultrasound guidance December 09, 2013 Left shoulder injected under ultrasound April 07, 2014 Left shoulder injected again in June 20, 2014   . STOMACH CANCER 03/03/2008  . Syncope 07/07/2016  . Tibial fracture 04/03/2017  . Type II diabetes mellitus with peripheral circulatory disorder ([place]) 01/01/2016  . Ulcer of left heel and midfoot with necrosis of muscle ([place]) 01/17/2016  . Urinary incontinence 02/02/2017  . UTI 12/15/2009    Past Surgical History:  Procedure  Laterality Date  . BALLOON DILATION N/A 07/12/2016   Procedure: BALLOON DILATION;  Surgeon: Manus Gunning, MD;  Location: [facility] ENDOSCOPY;  Service: Gastroenterology;  Laterality: N/A;  . BILROTH II PROCEDURE    . CATARACT EXTRACTION W/ INTRAOCULAR LENS  IMPLANT, BILATERAL    . COLONOSCOPY    . ESOPHAGOGASTRODUODENOSCOPY N/A 07/12/2016   Procedure: ESOPHAGOGASTRODUODENOSCOPY (EGD);  Surgeon: Manus Gunning, MD;  Location: [place];  Service: Gastroenterology;   Laterality: N/A;  . FRACTURE SURGERY    . HIP FRACTURE SURGERY Right 03/2015     reports that she quit smoking about 33 years ago. Her smoking use included Cigarettes. She has a 12.50 pack-year smoking history. She has never used smokeless tobacco. She reports that she does not drink alcohol or use drugs.  Allergies  Allergen Reactions  . Penicillins Shortness Of Breath and Swelling    Has patient had a PCN reaction causing immediate rash, facial/tongue/throat swelling, SOB or lightheadedness with hypotension: yes Has patient had a PCN reaction causing severe rash involving mucus membranes or skin necrosis: no Has patient had a PCN reaction that required hospitalization: unknown Has patient had a PCN reaction occurring within the last 10 years: no If all of the above answers are "NO", then may proceed with Cephalosporin use.   . Voltaren [Diclofenac Sodium] Other (See Comments)    Stomach irritation  . Dulcolax [Bisacodyl]     Unknown   . Duloxetine Hcl Other (See Comments)    Made patient not want to eat or drink  . Oxycodone Other (See Comments)    hallucinations  . Timolol     REACTION: bradycardia worse    Family History  Problem Relation Age of Onset  . Uterine cancer Mother   . Stroke Father        Hemorrhagic  . Cancer Sister        breast cancer and one sister with uterine cancer and one sister with ovarian cancerr  . Colon cancer Maternal Grandmother   . Colon cancer Paternal Grandfather   . Diabetes Other   . Prostate cancer Brother   . Stomach cancer Neg Hx     Prior to Admission medications   Medication Sig Start Date End Date Taking? Authorizing Provider  acetaminophen (TYLENOL) 325 MG tablet Take 2 tablets (650 mg total) by mouth 3 (three) times daily. 04/04/17  Yes Barton Dubois, MD  amLODipine (NORVASC) 5 MG tablet TAKE 1 TABLET (5 MG TOTAL) BY MOUTH DAILY. 02/07/17  Yes Biagio Borg, MD  bisacodyl (DULCOLAX) 10 MG suppository Place 10 mg rectally as  needed for moderate constipation.   Yes [provider]  colchicine 0.6 MG tablet TAKE 1 TABLET (0.6 MG TOTAL) BY MOUTH DAILY AS NEEDED (GOUT PAIN.). 03/10/17  Yes Biagio Borg, MD  docusate sodium (COLACE) 100 MG capsule Take 1 capsule (100 mg total) by mouth 2 (two) times daily as needed for mild constipation. 04/02/17  Yes Little, Wenda Overland, MD  donepezil (ARICEPT) 10 MG tablet TAKE 1 TABLET (10 MG TOTAL) BY MOUTH AT BEDTIME. 03/15/17  Yes Biagio Borg, MD  gabapentin (NEURONTIN) 100 MG capsule Take 100 mg by mouth 3 (three) times daily.    Yes [provider]  HYDROcodone-acetaminophen (NORCO/VICODIN) 5-325 MG tablet Take 1 tablet by mouth every 8 (eight) hours. For 2 weeks. (started 08/07/18 and suppose to stop 08/22/17)   Yes [provider]  lactose free nutrition (BOOST PLUS) LIQD Take 237 mLs by mouth 3 (  three) times daily between meals. 01/29/16  Yes Hongalgi, Lenis Dickinson, MD  latanoprost (XALATAN) 0.005 % ophthalmic solution PLACE 1 DROP INTO BOTH EYES AT BEDTIME. 01/17/17  Yes Biagio Borg, MD  magnesium hydroxide (MILK OF MAGNESIA) 400 MG/5ML suspension Take 30 mLs by mouth daily as needed for mild constipation.   Yes [provider]  Multiple Vitamins-Minerals (MULTIVITAMIN WITH MINERALS) tablet Take 1 tablet by mouth daily.   Yes [provider]  NAMENDA XR 28 MG CP24 24 hr capsule TAKE 1 CAPSULE (28 MG TOTAL) BY MOUTH DAILY. 02/07/17  Yes Biagio Borg, MD  omeprazole (PRILOSEC) 40 MG capsule TAKE 1 CAPSULE (40 MG TOTAL) BY MOUTH DAILY. 02/17/17  Yes Biagio Borg, MD  oxybutynin (DITROPAN) 5 MG tablet Take 5 mg by mouth 3 (three) times daily.   Yes [provider]  Sodium Phosphates (RA SALINE ENEMA) 19-7 GM/118ML ENEM Place 1 each rectally as needed (for constipation).   Yes [provider]  ULORIC 40 MG tablet TAKE 1 TABLET (40 MG TOTAL) BY MOUTH DAILY. 02/17/17  Yes Biagio Borg, MD    Physical Exam: Vitals:   09/01/2017  0700 09-01-17 0715 09/01/17 0730 09/01/17 0745  BP: (!) 98/48 107/90 123/90 (!) 115/52  Pulse: (!) 107 (!) 110 (!) 117 (!) 109  Resp: (!) 26 (!) 23 (!) 21 (!) 24  Temp: 98.2 F (36.8 C) 98.1 F (36.7 C) 97.7 F (36.5 C) (!) 97.5 F (36.4 C)  TempSrc:      SpO2: (!) 83% (!) 27% 98% 100%  Weight:      Height:          Constitutional: NAD, calm, comfortable, cachectic.  Pleasantly demented Vitals:   09-01-2017 0700 Sep 01, 2017 0715 2017/09/01 0730 September 01, 2017 0745  BP: (!) 98/48 107/90 123/90 (!) 115/52  Pulse: (!) 107 (!) 110 (!) 117 (!) 109  Resp: (!) 26 (!) 23 (!) 21 (!) 24  Temp: 98.2 F (36.8 C) 98.1 F (36.7 C) 97.7 F (36.5 C) (!) 97.5 F (36.4 C)  TempSrc:      SpO2: (!) 83% (!) 27% 98% 100%  Weight:      Height:       Eyes: PERRL, lids and conjunctivae normal ENMT: Mucous membranes are moist. Posterior pharynx clear of any exudate or lesions.Normal dentition.  Neck: normal, supple, no masses, no thyromegaly Respiratory:  Rhonchi to auscultation bilaterally, no wheezing, no crackles. Normal respiratory effort. No accessory muscle use.  Cardiovascular: Regular rate and rhythm, no murmurs / rubs / gallops. No extremity edema. 2+ pedal pulses. No carotid bruits.  Abdomen: no tenderness, no masses palpated. No hepatosplenomegaly. Bowel sounds positive.  Musculoskeletal: no clubbing / cyanosis. No joint deformity upper and lower extremities. Good ROM, no contractures. Normal muscle tone.  Skin: no rashes, lesions, ulcers. No induration Neurologic: CN 2-12 grossly intact. Sensation intact, DTR normal. Strength 5/5 in all 4.  Psychiatric: Normal judgment and insight. Alert and oriented x 3. Normal mood.    Labs on Admission: I have personally reviewed following labs and imaging studies  CBC:  Recent Labs Lab Sep 01, 2017 0600  WBC 7.1  NEUTROABS 6.5  HGB 11.2*  HCT 34.5*  MCV 105.2*  PLT 009   Basic Metabolic Panel:  Recent Labs Lab Sep 01, 2017 0600  NA 138  K 4.0  CL  111  CO2 16*  GLUCOSE 66  BUN 24*  CREATININE 1.26*  CALCIUM 8.5*   GFR: Estimated Creatinine Clearance: 28.9 mL/min (A) (by  C-G formula based on SCr of 1.26 mg/dL (H)). Liver Function Tests:  Recent Labs Lab 2017-09-06 0600  AST 36  ALT 14  ALKPHOS 161*  BILITOT 0.4  PROT 5.9*  ALBUMIN 2.1*   CBG:  Recent Labs Lab September 06, 2017 0555 09-06-2017 0749  GLUCAP 50* 58*   Urine analysis:    Component Value Date/Time   COLORURINE YELLOW 06-Sep-2017 0637   APPEARANCEUR TURBID (A) 09/06/17 0637   LABSPEC 1.012 06-Sep-2017 0637   PHURINE 5.0 September 06, 2017 0637   GLUCOSEU NEGATIVE 06-Sep-2017 0637   GLUCOSEU NEGATIVE 02/02/2017 1449   HGBUR MODERATE (A) 06-Sep-2017 0637   BILIRUBINUR NEGATIVE 09-06-2017 0637   BILIRUBINUR negative 08/13/2014 [place] 09-06-17 0637   PROTEINUR 30 (A) 09-06-2017 0637   UROBILINOGEN 0.2 02/02/2017 1449   NITRITE NEGATIVE 09/06/2017 0637   LEUKOCYTESUR LARGE (A) 09-06-17 0637   Sepsis Labs: !!!!!!!!!!!!!!!!!!!!!!!!!!!!!!!!!!!!!!!!!!!! @LABRCNTIP (procalcitonin:4,lacticidven:4) )No results found for this or any previous visit (from the past 240 hour(s)).   Radiological Exams on Admission: Dg Chest Port 1 View  Result Date: 2017/09/06 CLINICAL DATA:  Acute onset of unresponsiveness.  Initial encounter. EXAM: PORTABLE CHEST 1 VIEW COMPARISON:  Chest radiograph performed 08/09/2016 FINDINGS: The lungs are well-aerated. Bilateral perihilar and right basilar airspace opacities raise concern for multifocal pneumonia. There is no evidence of pleural effusion or pneumothorax. The cardiomediastinal silhouette is borderline normal in size. Diffuse calcification is seen along the thoracic aorta; it measures 4.3 cm in diameter, likely within normal limits for the patient's age. No acute osseous abnormalities are seen. IMPRESSION: Bilateral perihilar and right basilar airspace opacities raise concern for multifocal pneumonia. Electronically Signed    By: Garald Balding M.D.   On: 2017-09-06 06:23    EKG: Independently reviewed.  Sinus tach rate 106 with tw abnormality nonspecific Old chart reviewed Case discussed with dr Roxanne Mins cxr reviewed bilateral patchy infiltrates   Assessment/Plan 83 yo female with likely septic shock from multiple sources  Principal Problem:   Sepsis ([place])- vanc/cefepime.  bp better with ivf.  Cont ns at 125cc/hour.  Lactic 6.  Serial q 3 hours.  Obtain blood, urine and sputum cultures  Active Problems:   HCAP (healthcare-associated pneumonia)- vanc/cefepime   Acute lower UTI- as above, urine cx pending   Alzheimer's dementia- noted   Essential hypertension- holding all bp meds due to shock   Dysphagia- noted, high aspiration risk, npo x meds for now   Type II diabetes mellitus with peripheral circulatory disorder ([place])- hold all meds   DNR (do not resuscitate)- noted   Hypoglycemia- give amp d50 and repeat glucose hourly.  Change ivf to d5 if not improved   Unresponsive - this is improved.  Ct head is pending.  Cleared for stroke by neuro team per dr Roxanne Mins.  Treat infection   Admit to stepdown.  Obtain palliative care consult.  Pt high risk of demise during this hospitalization.   DVT prophylaxis:  lovenox Code Status:  DNR Family Communication:  none  Disposition Plan:  Per day team Consults called:  none Admission status:   admission   Riki Gehring A MD [facility]  If 7PM-7AM, please contact night-coverage [email or web address] Password [email or web address]  09/06/2017, 8:22 AM   Family wishes to try levophed.  sbp dropped in 70s after 4 liters ivf.  Cont bolus.  Have prepared daughter Hulda Marin at bedside that I expect her to expire in the next couple of hours.  Pt has become unresponsive again.  She is DNR, no cpr  or intubation confirmed.   Pt continues on levophed drip per family wishes.  Have called PCCM dr Nelda Marseille as pt will need icu level of care.

## 2017-08-31 NOTE — ED Notes (Signed)
Report called to 6N

## 2017-08-31 NOTE — ED Notes (Signed)
IV attempted x2 without success.

## 2017-08-31 NOTE — ED Notes (Signed)
Pt c/o severe pain in right knee-- states -- I don't know why it is hurting so bad" -- report received from Ophelia Charter, Therapist, sports. Pt is alert, oriented to place, answering questions.

## 2017-08-31 NOTE — ED Provider Notes (Signed)
[facility] EMERGENCY DEPARTMENT Provider Note   CSN: [id number] Arrival date & time: 09-04-2017  0547     History   Chief Complaint Chief Complaint  Patient presents with  . Altered Mental Status  . Code Stroke    HPI Shari Mcmahon is a 83 y.o. female.  The history is provided by the EMS personnel. The history is limited by the condition of the patient (Unresponsive).  She was transferred from a nursing home where she was found on the floor and unresponsive.  No evidence of emesis where she was.  Last seen normal at 2:45 AM. EMS noted hypoxia and hypotension.  They inserted a nasal airway.  Patient has a signed DNR, so no more aggressive treatment was done and she was transported to the ED.  EMS did note gaze to the right and questioned a stroke, so she was transported as a code stroke.  Past Medical History:  Diagnosis Date  . ABDOMINAL PAIN, CHRONIC 04/07/2008  . Alzheimer's dementia 07/28/2015  . Anemia 02/25/2015  . Aneurysm of thoracic aorta ([place])    "found 03/2015; not big enough to repair" (07/07/2016)  . ARTHRITIS 03/03/2008  . Arthritis    "shoulders, knees, hands" (07/07/2016)  . Arthritis of knee, degenerative 10/19/2011  . Aspiration pneumonia ([place])   . Atherosclerosis of native arteries of the extremities with ulceration ([place]) 05/11/2016  . BRADYCARDIA, CHRONIC 12/17/2008  . CARDIAC MURMUR 02/01/2010  . Chronic pain syndrome 06/16/2014  . Closed right tibial fracture 04/03/2017  . Complication of anesthesia    "she retains carbon dioxide; sleeps too long" (07/07/2016)  . CONSTIPATION, CHRONIC 10/15/2010  . DEGENERATIVE JOINT DISEASE, KNEES, BILATERAL 12/07/2007  . DEPRESSION 06/17/2009  . DIABETES MELLITUS, TYPE II 09/19/2007   DIET CONTROL   . DVT (deep venous thrombosis) (HCC) BLE  . Dysphagia 12/24/2009   Qualifier: Diagnosis of  By: Varney Daily RN, Butch Penny    . DYSPHAGIA UNSPECIFIED 12/24/2009  . E. coli UTI 12/15/2009   Qualifier: Diagnosis of  By: Jenny Reichmann MD,  Hunt Oris   . Fracture of right femur [facility]) 07/28/2015   S/p ORIF in 03/2015   . Gastroparesis 12/24/2009  . GENERALIZED OSTEOARTHROSIS INVOLVING HAND 10/03/2007  . GERD (gastroesophageal reflux disease)   . GERD without esophagitis 10/30/2015  . GLAUCOMA 09/19/2007  . GOUT 09/19/2007  . GOUTY ARTHROPATHY UNSPECIFIED 02/17/2009  . Hepatitis   . HYPERLIPIDEMIA 01/27/2010  . HYPERTENSION 09/19/2007  . Hypokalemia   . LUNG NODULE 03/03/2008  . Lung nodules 03/03/2008   Qualifier: Diagnosis of  By: Marland Mcalpine    . MENOPAUSAL DISORDER 12/17/2008  . Peripheral autonomic neuropathy due to diabetes mellitus ([place]) 11/15/2015  . PERIPHERAL NEUROPATHY 06/19/2008  . Peripheral neuropathy ([place]) 06/19/2008   With chronic pain   . PERSONAL HISTORY MALIGNANT NEOPLASM STOMACH 09/19/2007  . 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 Rotator cuff tear arthropathy of both shoulders 12/09/2013   Injected under ultrasound guidance December 09, 2013 Left shoulder injected under ultrasound April 07, 2014 Left shoulder injected again in June 20, 2014   . STOMACH CANCER 03/03/2008  . Syncope 07/07/2016  . Tibial fracture 04/03/2017  . Type II diabetes mellitus with peripheral circulatory disorder ([place]) 01/01/2016  . Ulcer of left heel and midfoot with necrosis of muscle ([place]) 01/17/2016  . Urinary incontinence 02/02/2017  . UTI 12/15/2009    Patient Active Problem List   Diagnosis Date Noted  . Overactive bladder 07/30/2017  .  Glaucoma, bilateral 05/21/2017  . Hypokalemia   . Fall 04/03/2017  . Closed right tibial fracture 04/03/2017  . Tibial fracture 04/03/2017  . Acute cystitis without hematuria   . Encounter for well adult exam with abnormal findings 02/02/2017  . Urinary incontinence 02/02/2017  . Seizure-like activity ([place])   . Syncope 07/07/2016  . Atherosclerosis of native arteries of the extremities with ulceration ([place]) 05/11/2016    Class: Chronic  . Weakness generalized   .  Hypernatremia   . Aspiration pneumonia ([place])   . AKI (acute kidney injury) ([place])   . Ulcer of left heel and midfoot with necrosis of muscle ([place]) 01/17/2016  . Type II diabetes mellitus with peripheral circulatory disorder ([place]) 01/01/2016  . Pressure ulcer 01/01/2016  . Peripheral autonomic neuropathy due to diabetes mellitus ([place]) 11/15/2015  . GERD without esophagitis 10/30/2015  . Alzheimer's dementia 07/28/2015  . Diabetes mellitus, type 2 ([place]) 07/28/2015  . Fracture of right femur ([place]) 07/28/2015  . Arthritis of knee 05/19/2015  . Anemia 02/25/2015  . Loss of weight 09/17/2014  . Chronic pain syndrome 06/16/2014  . Primary localized osteoarthrosis, lower leg 01/08/2014  . Rotator cuff tear arthropathy of both shoulders 12/09/2013  . CONSTIPATION, CHRONIC 10/15/2010  . Hyperlipidemia 01/27/2010  . Gastroparesis 12/24/2009  . Dysphagia 12/24/2009  . E. coli UTI 12/15/2009  . DEPRESSION 06/17/2009  . Gouty arthropathy 02/17/2009  . BRADYCARDIA, CHRONIC 12/17/2008  . Diabetic peripheral neuropathy associated with type 2 diabetes mellitus ([place]) 06/19/2008  . Lung nodules 03/03/2008  . GENERALIZED OSTEOARTHROSIS INVOLVING HAND 10/03/2007  . Gout 09/19/2007  . GLAUCOMA 09/19/2007  . Essential hypertension 09/19/2007    Past Surgical History:  Procedure Laterality Date  . BALLOON DILATION N/A 07/12/2016   Procedure: BALLOON DILATION;  Surgeon: Manus Gunning, MD;  Location: [facility] ENDOSCOPY;  Service: Gastroenterology;  Laterality: N/A;  . BILROTH II PROCEDURE    . CATARACT EXTRACTION W/ INTRAOCULAR LENS  IMPLANT, BILATERAL    . COLONOSCOPY    . ESOPHAGOGASTRODUODENOSCOPY N/A 07/12/2016   Procedure: ESOPHAGOGASTRODUODENOSCOPY (EGD);  Surgeon: Manus Gunning, MD;  Location: [facility];  Service: Gastroenterology;  Laterality: N/A;  . FRACTURE SURGERY    . HIP FRACTURE SURGERY Right 03/2015    OB History    No data available       Home Medications     Prior to Admission medications   Medication Sig Start Date End Date Taking? Authorizing Provider  acetaminophen (TYLENOL) 325 MG tablet Take 2 tablets (650 mg total) by mouth 3 (three) times daily. 04/04/17   Barton Dubois, MD  amLODipine (NORVASC) 5 MG tablet TAKE 1 TABLET (5 MG TOTAL) BY MOUTH DAILY. 02/07/17   Biagio Borg, MD  colchicine 0.6 MG tablet TAKE 1 TABLET (0.6 MG TOTAL) BY MOUTH DAILY AS NEEDED (GOUT PAIN.). 03/10/17   Biagio Borg, MD  docusate sodium (COLACE) 100 MG capsule Take 1 capsule (100 mg total) by mouth 2 (two) times daily as needed for mild constipation. 04/02/17   Little, Wenda Overland, MD  donepezil (ARICEPT) 10 MG tablet TAKE 1 TABLET (10 MG TOTAL) BY MOUTH AT BEDTIME. 03/15/17   Biagio Borg, MD  gabapentin (NEURONTIN) 100 MG capsule Take 100 mg by mouth. Take one capsule three times daily for neuropathy    [provider]  HYDROcodone-acetaminophen (NORCO/VICODIN) 5-325 MG tablet Take by mouth. 1 tablet every 6 hours for moderate pain, 2 tablets every 6 hours for severe pain    [provider]  lactose free nutrition (BOOST PLUS) LIQD Take 237 mLs by mouth 3 (three) times daily between meals. 01/29/16   Hongalgi, Lenis Dickinson, MD  latanoprost (XALATAN) 0.005 % ophthalmic solution PLACE 1 DROP INTO BOTH EYES AT BEDTIME. 01/17/17   Biagio Borg, MD  Multiple Vitamins-Minerals (MULTIVITAMIN WITH MINERALS) tablet Take 1 tablet by mouth daily.    [provider]  NAMENDA XR 28 MG CP24 24 hr capsule TAKE 1 CAPSULE (28 MG TOTAL) BY MOUTH DAILY. 02/07/17   Biagio Borg, MD  omeprazole (PRILOSEC) 40 MG capsule TAKE 1 CAPSULE (40 MG TOTAL) BY MOUTH DAILY. 02/17/17   Biagio Borg, MD  oxybutynin (DITROPAN-XL) 5 MG 24 hr tablet Take 5 mg by mouth. Take one tablet three times daily for overactive bladder    [provider]  ULORIC 40 MG tablet TAKE 1 TABLET (40 MG TOTAL) BY MOUTH DAILY. 02/17/17   Biagio Borg, MD    Family History Family History   Problem Relation Age of Onset  . Uterine cancer Mother   . Stroke Father        Hemorrhagic  . Cancer Sister        breast cancer and one sister with uterine cancer and one sister with ovarian cancerr  . Colon cancer Maternal Grandmother   . Colon cancer Paternal Grandfather   . Diabetes Other   . Prostate cancer Brother   . Stomach cancer Neg Hx     Social History Social History  Substance Use Topics  . Smoking status: Former Smoker    Packs/day: 0.50    Years: 25.00    Types: Cigarettes    Quit date: 11/01/1983  . Smokeless tobacco: Never Used     Comment: "stopped when she was 83 years old"  . Alcohol use No     Comment: 07/07/2016 "nothing in years; stopped maybe in the  1980s"     Allergies   Penicillins; Voltaren [diclofenac sodium]; Dulcolax [bisacodyl]; Duloxetine hcl; Oxycodone; and Timolol   Review of Systems Review of Systems  Unable to perform ROS: Patient unresponsive     Physical Exam Updated Vital Signs BP (!) 79/39 (BP Location: Left Arm)   Pulse (!) 102   Temp 98.6 F (37 C) (Temporal)   Resp 18   Ht 5\' 6"  (1.676 m)   Wt 63.1 kg (139 lb 1 oz)   SpO2 (!) 89%   BMI 22.45 kg/m   Physical Exam  Nursing note and vitals reviewed.  83 year old female, unresponsive with nasal trumpet in place.  She is breathing spontaneously and being assisted ventilation with bag valve mask. Vital signs are significant for hypotension and borderline tachycardia. Oxygen saturation is 89%, which is hypoxic-even though she is getting 100% oxygen. Head is normocephalic and atraumatic.  Pupils are midposition and sluggishly reactive.  Gaze is to the right-does not cross the midline to the left.  Corneal reflex present bilaterally. Oropharynx is clear.  Faint low amplitude twitching noted in the circumoral muscles and also muscles of the neck.  Neck is nontender and supple without adenopathy or JVD. Back is nontender and there is no CVA tenderness. Lungs have coarse  expiratory rhonchi throughout. Chest is nontender. Heart has regular rate and rhythm without murmur. Abdomen is soft, flat, nontender without masses or hepatosplenomegaly and peristalsis is normoactive. Extremities have no cyanosis or edema, full range of motion is present.  Right leg is externally rotated. Skin is warm and dry without rash.  Neurologic: She is unresponsive to verbal and painful stimuli.  Virtually no muscle tone throughout.  Low amplitude seizure-like activity as noted above.  Only reflex unable to obtain is bilateral corneal reflex.  ED Treatments / Results  Labs (all labs ordered are listed, but only abnormal results are displayed) Labs Reviewed  COMPREHENSIVE METABOLIC PANEL - Abnormal; Notable for the following:       Result Value   CO2 16 (*)    BUN 24 (*)    Creatinine, Ser 1.26 (*)    Calcium 8.5 (*)    Total Protein 5.9 (*)    Albumin 2.1 (*)    Alkaline Phosphatase 161 (*)    GFR calc non Af Amer 37 (*)    GFR calc Af Amer 43 (*)    All other components within normal limits  CBC WITH DIFFERENTIAL/PLATELET - Abnormal; Notable for the following:    RBC 3.28 (*)    Hemoglobin 11.2 (*)    HCT 34.5 (*)    MCV 105.2 (*)    MCH 34.1 (*)    RDW 18.8 (*)    All other components within normal limits  URINALYSIS, ROUTINE W REFLEX MICROSCOPIC - Abnormal; Notable for the following:    APPearance TURBID (*)    Hgb urine dipstick MODERATE (*)    Protein, ur 30 (*)    Leukocytes, UA LARGE (*)    Bacteria, UA MANY (*)    Squamous Epithelial / LPF 0-5 (*)    All other components within normal limits  LACTIC ACID, PLASMA - Abnormal; Notable for the following:    Lactic Acid, Venous 6.0 (*)    All other components within normal limits  CBG MONITORING, ED - Abnormal; Notable for the following:    Glucose-Capillary 50 (*)    All other components within normal limits  I-STAT CG4 LACTIC ACID, ED - Abnormal; Notable for the following:    Lactic Acid, Venous 5.17 (*)      All other components within normal limits  CBG MONITORING, ED - Abnormal; Notable for the following:    Glucose-Capillary 58 (*)    All other components within normal limits  CULTURE, BLOOD (ROUTINE X 2)  CULTURE, BLOOD (ROUTINE X 2)  URINE CULTURE  I-STAT TROPONIN, ED    EKG  EKG Interpretation  Date/Time:  2017/09/02 05:54:42 EDT Ventricular Rate:  106 PR Interval:    QRS Duration: 90 QT Interval:  347 QTC Calculation: 461 R Axis:   42 Text Interpretation:  Sinus tachycardia Atrial premature complex Nonspecific repol abnormality, diffuse leads When compared with ECG of 07/07/2016, Nonspecific ST abnormality is more pronounced Reconfirmed by Delora Fuel ([id number]) on 02-Sep-2017 6:01:28 AM       Radiology Dg Chest Port 1 View  Result Date: 09-02-2017 CLINICAL DATA:  Acute onset of unresponsiveness.  Initial encounter. EXAM: PORTABLE CHEST 1 VIEW COMPARISON:  Chest radiograph performed 08/09/2016 FINDINGS: The lungs are well-aerated. Bilateral perihilar and right basilar airspace opacities raise concern for multifocal pneumonia. There is no evidence of pleural effusion or pneumothorax. The cardiomediastinal silhouette is borderline normal in size. Diffuse calcification is seen along the thoracic aorta; it measures 4.3 cm in diameter, likely within normal limits for the patient's age. No acute osseous abnormalities are seen. IMPRESSION: Bilateral perihilar and right basilar airspace opacities raise concern for multifocal pneumonia. Electronically Signed   By: Garald Balding M.D.   On: 09/02/2017 06:23    Procedures Procedures (including critical care time) CRITICAL  CARE Performed by: OZDGU,[email or web address] Total critical care time: 130 minutes Critical care time was exclusive of separately billable procedures and treating other patients. Critical care was necessary to treat or prevent imminent or life-threatening deterioration. Critical care was time spent personally by me on the  following activities: development of treatment plan with patient and/or surrogate as well as nursing, discussions with consultants, evaluation of patient's response to treatment, examination of patient, obtaining history from patient or surrogate, ordering and performing treatments and interventions, ordering and review of laboratory studies, ordering and review of radiographic studies, pulse oximetry and re-evaluation of patient's condition.  Medications Ordered in ED Medications  ceFEPIme (MAXIPIME) 2 g in dextrose 5 % 50 mL IVPB (2 g Intravenous New Bag/Given 09/10/17 0758)  vancomycin (VANCOCIN) IVPB 1000 mg/200 mL premix (not administered)  sodium chloride 0.9 % bolus 1,000 mL (0 mLs Intravenous Stopped September 10, 2017 0703)  levETIRAcetam (KEPPRA) 1,000 mg in sodium chloride 0.9 % 100 mL IVPB (0 mg Intravenous Stopped 09/10/17 0702)  fentaNYL (SUBLIMAZE) injection 50 mcg (50 mcg Intravenous Given 10-Sep-2017 0743)  dextrose 50 % solution 50 mL (50 mLs Intravenous Given 2017-09-10 0757)     Initial Impression / Assessment and Plan / ED Course  I have reviewed the triage vital signs and the nursing notes.  Pertinent labs & imaging results that were available during my care of the patient were reviewed by me and considered in my medical decision making (see chart for details).  Altered mental status.  This could be related to stroke or seizure.  Also need to consider possibility of sepsis.  She is started on aggressive fluid resuscitation.  Because of DNR status, she will not be intubated.  Old records are reviewed, and she has numerous visits for orthopedic complaints, as well as one visit for urinary tract infection.  Lactate is significantly elevated.  This is clearly related to her blood pressure.  We will proceed with sepsis antibiotics if evidence of pneumonia on chest x-ray, or if evidence of urinary tract infection on urinalysis.  0630 Patient is afebrile even with a rectal temperature.  With  IV fluids, blood pressure has come up.  Patient is now alert and talking.  EOMs are now full.  I suspect seizure and postictal state.  Lactic acid level is clearly related to hypotension-no evidence of sepsis.  But will recheck lactate level after fluid resuscitation.  0730 Patient is complaining of pain in her right knee and is given a dose of fentanyl for pain control.  Blood pressure remains adequate, but oxygen saturation remains borderline.  Urinalysis does show evidence of infection, and chest x-ray is suspicious for multifocal pneumonia.  She is started on antibiotics of vancomycin and cefepime which would give adequate coverage to healthcare associated pneumonia and healthcare associated urinary tract infection.  Blood glucose was noted to be mildly decreased at 66, and she is given a dose of intravenous glucose.  I do not feel this is low enough to account for her condition, and she improved before glucose was given.  At this point, CT of head is pending.  Case is discussed with Dr. Shanon Brow of [facility] hospitalist who agrees to admit the patient.  Final Clinical Impressions(s) / ED Diagnoses   Final diagnoses:  Unresponsive episode  Hypotension, unspecified hypotension type  Hypoglycemia  Urinary tract infection without hematuria, site unspecified  HCAP (healthcare-associated pneumonia)  Seizure (HCC)  Elevated lactic acid level  Renal insufficiency  Macrocytic anemia    New Prescriptions New Prescriptions  No medications on file     Delora Fuel, MD 49/70/26 [phone]

## 2017-08-31 NOTE — Progress Notes (Signed)
Patients time of death 2105. Multiple family members at bedside. Patient transferred to [facility] from room. Small silver colored necklace left on patient per family request.  Jimmie Molly, RN

## 2017-08-31 NOTE — Progress Notes (Signed)
Pharmacy Antibiotic Note  Shari Mcmahon is a 83 y.o. female admitted on 08-25-17 after being found unresponsive. CCM evaluated and plans appear to be comfort care however MD Loralee Pacas) still desires to continue antibiotics for now and pharmacy has been consulted for Vancomycin dosing.  Vancomycin 1g given earlier today at 0900.  SCr 1.15, CrCl~30 ml/min.   Plan: 1. Start Vancomycin 750 mg IV every 24 hours (next dose on 10/23) 2. Adjust Cefepime to 1g IV every 24 hours  3. Will continue to follow renal function, culture results, LOT, and antibiotic de-escalation plans   Height: 5\' 6"  (167.6 cm) Weight: 142 lb (64.4 kg) IBW/kg (Calculated) : 59.3  Temp (24hrs), Avg:98 F (36.7 C), Min:97.2 F (36.2 C), Max:98.6 F (37 C)   Recent Labs Lab 08-25-17 0600 Aug 25, 2017 0616 25-Aug-2017 0657 25-Aug-2017 0945 August 25, 2017 1000 Aug 25, 2017 1411  WBC 7.1  --   --   --  8.1  --   CREATININE 1.26*  --   --   --  1.15*  --   LATICACIDVEN  --  5.17* 6.0* 4.4*  --  5.3*    Estimated Creatinine Clearance: 31.7 mL/min (A) (by C-G formula based on SCr of 1.15 mg/dL (H)).    Allergies  Allergen Reactions  . Penicillins Shortness Of Breath and Swelling    Has patient had a PCN reaction causing immediate rash, facial/tongue/throat swelling, SOB or lightheadedness with hypotension: yes Has patient had a PCN reaction causing severe rash involving mucus membranes or skin necrosis: no Has patient had a PCN reaction that required hospitalization: unknown Has patient had a PCN reaction occurring within the last 10 years: no If all of the above answers are "NO", then may proceed with Cephalosporin use.   . Voltaren [Diclofenac Sodium] Other (See Comments)    Stomach irritation  . Dulcolax [Bisacodyl]     Unknown   . Duloxetine Hcl Other (See Comments)    Made patient not want to eat or drink  . Oxycodone Other (See Comments)    hallucinations  . Timolol     REACTION: bradycardia worse    Thank you  for allowing pharmacy to be a part of this patient's care.  Lawson Radar 08/25/17 4:01 PM

## 2017-08-31 NOTE — ED Notes (Signed)
Daughter at bedside, called out-- pt is unresponsive at this time-- breathing on own, sats maintaining in 80's BP 76/51, Dr. Ree Kida.

## 2017-08-31 NOTE — ED Triage Notes (Signed)
Patient presents to ED via [facility] states patient was last seen normal at 0245  Around 530am per ems staff smelled something and patient had been incont of stool was unresp. Upon ems arrival patient was being bagged. To assist with ventilations. Patient will grimace with iv stick. Patient has DNR.

## 2017-08-31 NOTE — Progress Notes (Signed)
Paged to ED for patient not doing well. TRAUMA C.  Brought family members back to be with her. Helped them get blanket, drink for daughter, Provided prayer and scripture for support. Once family calmed from the shock, they said they would call back as needed. Conard Novak, Chaplain    08/22/17 1100  Clinical Encounter Type  Visited With Patient and family together  Visit Type Initial;Spiritual support  Referral From Nurse  [place] text;Prayer

## 2017-08-31 NOTE — Consult Note (Signed)
Called by [facility]-MD for consultation on a patient with a pneumonia, septic shock, hypoxemic respiratory failure and DNR.  Patient was placed on levophed for BP support.  On exam, patient is very confused, visibly in severe respiratory distress.  On 8 mcg of levo with SBP of 70.  Confirmed DNR status.  Patient is clearly dying.  There is little that can be done for her.  Spoke with the family.  After a long discussion, they acknowledged that patient is dying.  They were agreeable to stopping levophed and focus more on comfort.  Will proceed with d/c of levophed.  Add 2 mg of IV morphine.  Add more as needed.  [facility]-MD was notified and case was discussed with her.  PCCM will sign off, please call back if needed.  The patient is critically ill with multiple organ systems failure and requires high complexity decision making for assessment and support, frequent evaluation and titration of therapies, application of advanced monitoring technologies and extensive interpretation of multiple databases.   Critical Care Time devoted to patient care services described in this note is  35  Minutes. This time reflects time of care of this signee Dr Jennet Maduro. This critical care time does not reflect procedure time, or teaching time or supervisory time of PA/NP/Med student/Med Resident etc but could involve care discussion time.  Rush Farmer, M.D. [facility] Pulmonary/Critical Care Medicine. Pager: [phone]. After hours pager: [phone].

## 2017-08-31 NOTE — ED Notes (Signed)
Patient was incont of stool peri care was done before and after foley insertion

## 2017-08-31 NOTE — Discharge Summary (Signed)
Death summary  83 yo female expired at 2105 on August 22, 2017 of septic shock from HCAP.

## 2017-08-31 NOTE — ED Notes (Signed)
Patient is much more alert c/o pain in her rectum, no obv. Problems noted with rectal temp. Patient does have apporx. Quarter size decub to sacral area.

## 2017-08-31 DEATH — deceased
